# Patient Record
Sex: Male | Born: 1940
Health system: Southern US, Community
[De-identification: ages and names within clinical notes are randomized; demographics above are authoritative.]

## PROBLEM LIST (undated history)

## (undated) DIAGNOSIS — H409 Unspecified glaucoma: Secondary | ICD-10-CM

## (undated) DIAGNOSIS — H269 Unspecified cataract: Secondary | ICD-10-CM

## (undated) DIAGNOSIS — I10 Essential (primary) hypertension: Secondary | ICD-10-CM

## (undated) DIAGNOSIS — I519 Heart disease, unspecified: Secondary | ICD-10-CM

## (undated) DIAGNOSIS — R413 Other amnesia: Secondary | ICD-10-CM

## (undated) DIAGNOSIS — C61 Malignant neoplasm of prostate: Secondary | ICD-10-CM

## (undated) DIAGNOSIS — G709 Myoneural disorder, unspecified: Secondary | ICD-10-CM

## (undated) DIAGNOSIS — H9190 Unspecified hearing loss, unspecified ear: Secondary | ICD-10-CM

## (undated) DIAGNOSIS — F0390 Unspecified dementia without behavioral disturbance: Secondary | ICD-10-CM

## (undated) DIAGNOSIS — E7800 Pure hypercholesterolemia, unspecified: Secondary | ICD-10-CM

## (undated) DIAGNOSIS — I639 Cerebral infarction, unspecified: Secondary | ICD-10-CM

## (undated) DIAGNOSIS — M1990 Unspecified osteoarthritis, unspecified site: Secondary | ICD-10-CM

## (undated) DIAGNOSIS — N189 Chronic kidney disease, unspecified: Secondary | ICD-10-CM

## (undated) DIAGNOSIS — J449 Chronic obstructive pulmonary disease, unspecified: Secondary | ICD-10-CM

## (undated) DIAGNOSIS — M109 Gout, unspecified: Secondary | ICD-10-CM

## (undated) DIAGNOSIS — C679 Malignant neoplasm of bladder, unspecified: Secondary | ICD-10-CM

## (undated) DIAGNOSIS — D869 Sarcoidosis, unspecified: Secondary | ICD-10-CM

## (undated) DIAGNOSIS — I509 Heart failure, unspecified: Secondary | ICD-10-CM

## (undated) HISTORY — DX: Unspecified cataract: H26.9

## (undated) HISTORY — DX: Malignant neoplasm of bladder, unspecified: C67.9

## (undated) HISTORY — DX: Essential (primary) hypertension: I10

## (undated) HISTORY — DX: Gout, unspecified: M10.9

## (undated) HISTORY — DX: Heart disease, unspecified: I51.9

## (undated) HISTORY — DX: Unspecified hearing loss, unspecified ear: H91.90

## (undated) HISTORY — PX: CATARACT EXTRACTION: SUR2

## (undated) HISTORY — PX: BLADDER SURGERY: SHX569

## (undated) HISTORY — DX: Sarcoidosis, unspecified: D86.9

## (undated) HISTORY — DX: Unspecified glaucoma: H40.9

## (undated) HISTORY — DX: Pure hypercholesterolemia, unspecified: E78.00

## (undated) HISTORY — PX: OTHER SURGICAL HISTORY: SHX169

## (undated) HISTORY — PX: EYE SURGERY: SHX253

## (undated) HISTORY — DX: Malignant neoplasm of prostate: C61

## (undated) HISTORY — DX: Other amnesia: R41.3

---

## 1997-04-21 HISTORY — PX: OTHER SURGICAL HISTORY: SHX169

## 1998-02-10 ENCOUNTER — Emergency Department (HOSPITAL_COMMUNITY): Admission: EM | Admit: 1998-02-10 | Discharge: 1998-02-10 | Payer: Self-pay | Admitting: Emergency Medicine

## 1998-03-22 ENCOUNTER — Inpatient Hospital Stay (HOSPITAL_COMMUNITY): Admission: RE | Admit: 1998-03-22 | Discharge: 1998-03-27 | Payer: Self-pay | Admitting: Orthopedic Surgery

## 1998-03-22 ENCOUNTER — Encounter: Payer: Self-pay | Admitting: Orthopedic Surgery

## 1998-05-17 ENCOUNTER — Encounter: Admission: RE | Admit: 1998-05-17 | Discharge: 1998-06-15 | Payer: Self-pay | Admitting: Orthopedic Surgery

## 1998-11-29 ENCOUNTER — Ambulatory Visit (HOSPITAL_BASED_OUTPATIENT_CLINIC_OR_DEPARTMENT_OTHER): Admission: RE | Admit: 1998-11-29 | Discharge: 1998-11-29 | Payer: Self-pay | Admitting: Orthopedic Surgery

## 2000-07-26 ENCOUNTER — Emergency Department (HOSPITAL_COMMUNITY): Admission: EM | Admit: 2000-07-26 | Discharge: 2000-07-26 | Payer: Self-pay | Admitting: Emergency Medicine

## 2005-09-04 ENCOUNTER — Encounter: Admission: RE | Admit: 2005-09-04 | Discharge: 2005-09-04 | Payer: Self-pay | Admitting: Family Medicine

## 2006-05-14 ENCOUNTER — Ambulatory Visit: Payer: Self-pay | Admitting: Oncology

## 2006-07-12 ENCOUNTER — Inpatient Hospital Stay (HOSPITAL_COMMUNITY): Admission: EM | Admit: 2006-07-12 | Discharge: 2006-07-16 | Payer: Self-pay | Admitting: Emergency Medicine

## 2006-07-12 ENCOUNTER — Ambulatory Visit: Payer: Self-pay | Admitting: Critical Care Medicine

## 2006-07-14 ENCOUNTER — Encounter (INDEPENDENT_AMBULATORY_CARE_PROVIDER_SITE_OTHER): Payer: Self-pay | Admitting: *Deleted

## 2006-07-29 ENCOUNTER — Ambulatory Visit: Payer: Self-pay | Admitting: Oncology

## 2006-07-29 LAB — CBC & DIFF AND RETIC
BASO%: 0.5 % (ref 0.0–2.0)
Basophils Absolute: 0 10*3/uL (ref 0.0–0.1)
EOS%: 1.1 % (ref 0.0–7.0)
HGB: 10.3 g/dL — ABNORMAL LOW (ref 13.0–17.1)
IRF: 0.46 — ABNORMAL HIGH (ref 0.070–0.380)
MCH: 20.8 pg — ABNORMAL LOW (ref 28.0–33.4)
RDW: 25.8 % — ABNORMAL HIGH (ref 11.2–14.6)
RETIC #: 96.4 10*3/uL (ref 31.8–103.9)
lymph#: 3.8 10*3/uL — ABNORMAL HIGH (ref 0.9–3.3)

## 2006-07-29 LAB — COMPREHENSIVE METABOLIC PANEL
ALT: 18 U/L (ref 0–53)
AST: 20 U/L (ref 0–37)
Albumin: 3.5 g/dL (ref 3.5–5.2)
Calcium: 8.8 mg/dL (ref 8.4–10.5)
Chloride: 103 mEq/L (ref 96–112)
Potassium: 4.2 mEq/L (ref 3.5–5.3)
Sodium: 137 mEq/L (ref 135–145)
Total Protein: 8.6 g/dL — ABNORMAL HIGH (ref 6.0–8.3)

## 2006-07-29 LAB — CHCC SMEAR

## 2006-07-29 LAB — MORPHOLOGY: PLT EST: ADEQUATE

## 2006-07-29 LAB — VITAMIN B12: Vitamin B-12: 513 pg/mL (ref 211–911)

## 2006-07-29 LAB — FOLATE: Folate: 13.3 ng/mL

## 2006-08-13 ENCOUNTER — Ambulatory Visit: Payer: Self-pay | Admitting: Critical Care Medicine

## 2006-09-27 ENCOUNTER — Ambulatory Visit: Payer: Self-pay | Admitting: Oncology

## 2006-09-30 LAB — CBC & DIFF AND RETIC
Eosinophils Absolute: 0.1 10*3/uL (ref 0.0–0.5)
LYMPH%: 42.2 % (ref 14.0–48.0)
MCHC: 32.9 g/dL (ref 32.0–35.9)
MCV: 72.6 fL — ABNORMAL LOW (ref 81.6–98.0)
MONO%: 13.3 % — ABNORMAL HIGH (ref 0.0–13.0)
NEUT#: 2.4 10*3/uL (ref 1.5–6.5)
NEUT%: 42.6 % (ref 40.0–75.0)
Platelets: 216 10*3/uL (ref 145–400)
RBC: 5.1 10*6/uL (ref 4.20–5.71)
Retic %: 0.8 % (ref 0.7–2.3)

## 2006-09-30 LAB — IRON AND TIBC
Iron: 39 ug/dL — ABNORMAL LOW (ref 42–165)
UIBC: 336 ug/dL

## 2006-10-12 ENCOUNTER — Ambulatory Visit: Payer: Self-pay | Admitting: Cardiology

## 2006-12-25 ENCOUNTER — Ambulatory Visit: Payer: Self-pay | Admitting: Oncology

## 2007-10-30 ENCOUNTER — Ambulatory Visit: Payer: Self-pay | Admitting: Internal Medicine

## 2007-10-30 ENCOUNTER — Inpatient Hospital Stay (HOSPITAL_COMMUNITY): Admission: EM | Admit: 2007-10-30 | Discharge: 2007-11-01 | Payer: Self-pay | Admitting: Emergency Medicine

## 2009-04-21 HISTORY — PX: CATARACT EXTRACTION: SUR2

## 2009-06-29 ENCOUNTER — Ambulatory Visit (HOSPITAL_COMMUNITY): Admission: RE | Admit: 2009-06-29 | Discharge: 2009-06-29 | Payer: Self-pay | Admitting: Orthopedic Surgery

## 2009-10-19 ENCOUNTER — Encounter (HOSPITAL_COMMUNITY): Admission: RE | Admit: 2009-10-19 | Discharge: 2010-01-17 | Payer: Self-pay | Admitting: Urology

## 2009-11-19 ENCOUNTER — Ambulatory Visit: Admission: RE | Admit: 2009-11-19 | Discharge: 2010-01-09 | Payer: Self-pay | Admitting: Radiation Oncology

## 2010-01-17 ENCOUNTER — Emergency Department (HOSPITAL_COMMUNITY)
Admission: EM | Admit: 2010-01-17 | Discharge: 2010-01-17 | Payer: Self-pay | Source: Home / Self Care | Admitting: Emergency Medicine

## 2010-01-20 ENCOUNTER — Ambulatory Visit
Admission: RE | Admit: 2010-01-20 | Discharge: 2010-04-20 | Payer: Self-pay | Source: Home / Self Care | Attending: Radiation Oncology | Admitting: Radiation Oncology

## 2010-04-22 ENCOUNTER — Ambulatory Visit
Admission: RE | Admit: 2010-04-22 | Discharge: 2010-05-21 | Payer: Self-pay | Source: Home / Self Care | Attending: Radiation Oncology | Admitting: Radiation Oncology

## 2010-05-22 ENCOUNTER — Ambulatory Visit: Payer: MEDICARE | Attending: Radiation Oncology | Admitting: Radiation Oncology

## 2010-05-22 DIAGNOSIS — R300 Dysuria: Secondary | ICD-10-CM | POA: Insufficient documentation

## 2010-05-22 DIAGNOSIS — C61 Malignant neoplasm of prostate: Secondary | ICD-10-CM | POA: Insufficient documentation

## 2010-05-22 DIAGNOSIS — Z510 Encounter for antineoplastic radiation therapy: Secondary | ICD-10-CM | POA: Insufficient documentation

## 2010-05-22 DIAGNOSIS — R350 Frequency of micturition: Secondary | ICD-10-CM | POA: Insufficient documentation

## 2010-07-04 LAB — DIFFERENTIAL
Basophils Absolute: 0 10*3/uL (ref 0.0–0.1)
Eosinophils Absolute: 0 10*3/uL (ref 0.0–0.7)
Lymphocytes Relative: 19 % (ref 12–46)
Monocytes Absolute: 1.7 10*3/uL — ABNORMAL HIGH (ref 0.1–1.0)
Neutro Abs: 8.7 10*3/uL — ABNORMAL HIGH (ref 1.7–7.7)
Neutrophils Relative %: 68 % (ref 43–77)

## 2010-07-04 LAB — CBC
HCT: 42.3 % (ref 39.0–52.0)
MCH: 25.7 pg — ABNORMAL LOW (ref 26.0–34.0)
MCHC: 35 g/dL (ref 30.0–36.0)
RDW: 16.5 % — ABNORMAL HIGH (ref 11.5–15.5)

## 2010-07-04 LAB — URINALYSIS, ROUTINE W REFLEX MICROSCOPIC
Glucose, UA: NEGATIVE mg/dL
pH: 5.5 (ref 5.0–8.0)

## 2010-07-04 LAB — POCT I-STAT, CHEM 8
Glucose, Bld: 204 mg/dL — ABNORMAL HIGH (ref 70–99)
HCT: 49 % (ref 39.0–52.0)
Hemoglobin: 16.7 g/dL (ref 13.0–17.0)
Potassium: 3.8 mEq/L (ref 3.5–5.1)
TCO2: 27 mmol/L (ref 0–100)

## 2010-07-04 LAB — URINE MICROSCOPIC-ADD ON

## 2010-07-16 ENCOUNTER — Ambulatory Visit: Payer: MEDICARE | Attending: Radiation Oncology | Admitting: Radiation Oncology

## 2010-09-03 NOTE — Discharge Summary (Signed)
NAMEJAKORI, BURKETT               ACCOUNT NO.:  [id number]   MEDICAL RECORD NO.:  [id number]          PATIENT TYPE:  INP   LOCATION:  5529                         FACILITY:  [facility]   PHYSICIAN:  Joylene John, MD       DATE OF BIRTH:  [birth year]   DATE OF ADMISSION:  10/29/2007  DATE OF DISCHARGE:                               DISCHARGE SUMMARY   The patient is being admitted on October 26, 2007, to the [facility].   CHIEF COMPLAINT:  Dizziness for last 1 week.   HISTORY OF PRESENT ILLNESS:  This is a 70 year old African-American male  with diabetes, high blood pressure coming into the ER with complaints of  dizziness, which has being on and off since last 1 week, worsened by  movement of the head associated with some visual problems.  Per the  patient, he has not had similar symptoms in the past.  The patient  denies any fevers at home, however, here is temperature is found to be  101.7.  The patient denies any sick contacts or recent travels.  The  patient did have some nausea last week, however, he denies any vomiting.   PAST MEDICAL HISTORY:  He has history of diabetes and high blood  pressure.   SOCIAL HISTORY:  He stopped smoking 2 years ago, prior to that he smoked  for about 20 to 30 years, 1 pack would last about 2 to 3 days.  No  alcohol or no drugs.   FAMILY HISTORY:  Stroke and diabetes.   ALLERGIES:  No known allergies.   CURRENT MEDICATIONS:  1. Amaryl 4 mg which was recently started by PCP.  2. Lotrel 10/20 mg a day.  3. Metformin 1000 mg four tablets at bedtime.  4. Insulin Levemir 14 units at bedtime.  5. Zocor 40 units at bedtime.  6. Aspirin 325 mg at bedtime.   ALLERGIES:  The patient denies any allergies to any foods or medicines.   REVIEW OF SYSTEMS:  As indicated in the HPI.   PHYSICAL EXAMINATION:  VITAL SIGNS:  Temperature of 101.5, blood  pressure 133/78, pulse of 88, respirations 23, and the patient is sating  99% on room air.  GENERAL:  In no acute distress.  LUNGS:  Clear to auscultation.  CARDIOVASCULAR:  Regular rate and rhythm.  NECK:  Supple.  No JVD appreciated.  LOWER EXTREMITY:  No edema.  NEUROLOGIC:  Exam was brief and limited secondary to the patient being  sleepy and not being cooperative.  Cranial nerves grossly intact.  No  nystagmus noted.  UPPER EXTREMITY:  Strength 5/5 bilaterally.  Lower extremity strength  5/5 bilaterally.  The patient was attached to several monitors.  Tandem  gait was not checked.   PERTINENT LABS:  Sodium of 136, potassium 3.7, chloride 104, bicarb 23,  glucose 165, BUN 8, creatine 0.9, calcium 9.2, hemoglobin is 11.5,  hematocrit is 36.6, white count is 10.5, and platelets is 182.  First  set of cardiac enzymes were negative.  Urinalysis was negative.  Chest x-  ray no acute pathology.  CT head, no acute pathology moderate small  vessel disease.   ASSESSMENT AND PLAN:  The patient will need an official neurology  consult in the morning.  The symptoms could be secondary to transient  ischemic attack.  Given the fact that patient also has fever, dizziness,  and diplopia, this could be upper respiratory illness affecting the  middle ear.  The patient may need an LP to rule out aseptic meningitis,  if the symptoms do not resolve or worsen.  We will continue his home  medications; however, we will hold his Amaryl and metformin.  We will  also hold the blood pressure medications since his blood pressure is  controlled here.  This may need to be restarted later in the time.      Joylene John, MD  Electronically Signed     RP/MEDQ  D:  10/30/2007  T:  10/30/2007  Job:  [id number]

## 2010-09-03 NOTE — Discharge Summary (Signed)
[name], [name]               ACCOUNT NO.:  [id number]   MEDICAL RECORD NO.:  [id number]          PATIENT TYPE:  INP   LOCATION:  5529                         FACILITY:  [facility]   PHYSICIAN:  Ramiro Harvest, MD    DATE OF BIRTH:  1941/04/06   DATE OF ADMISSION:  10/30/2007  DATE OF DISCHARGE:  11/01/2007                               DISCHARGE SUMMARY   PRIMARY CARE PHYSICIAN:  Dr. Stacie Acres. Cliffton Asters, MD of [facility] physicians.   DISCHARGE DIAGNOSES:  1. Acute viral illness.  2. Dehydration/orthostasis.  3. Hypokalemia.  4. Type 2 diabetes.  5. Hyperlipidemia.  6. Hypertension.  7. History of mediastinal lymphadenopathy.   DISCHARGE MEDICATIONS:  1. Levemir insulin 14 units at bedtime.  2. Metformin 1000 mg p.o. daily.  3. Lotrel 10/20 p.o. daily.  4. Aspirin 325 mg p.o. daily.  5. Simvastatin 40 mg p.o. daily.  6. Amaryl 4 mg p.o. daily.   DISPOSITION AND FOLLOWUP:  The patient will be discharged home to follow  up with his PCP in 2 weeks.  On followup, a basic metabolic profile  needs to be checked to follow up on the patient's electrolytes.  The  patient's blood pressure will also need to be reassessed per PCP as well  as his diabetes.   CONSULTATIONS DONE:  None.   PROCEDURES PERFORMED:  1. A head CT without contrast was done on October 29, 2007, that showed      no acute intracranial abnormalities and moderate changes of small      vessel disease of white matter, which was stable.  Chest x-ray done      on October 30, 2007, showed stable chronic mediastinal lymphadenopathy      and increased interstitial markings, query sarcoidosis, and no      acute cardiopulmonary abnormality.  2. Chest x-ray done on October 31, 2007, showed increasing left basilar      atelectasis, otherwise stable.  Repeat head CT done on October 31, 2007, showed no acute intracranial abnormality or significant      interval change, periventricular white matter hypoattenuation is      demonstrated.   This is nonspecific but likely reflects the sequelae      of chronic microvascular ischemia.   BRIEF ADMITTING HISTORY AND PHYSICAL:  [name] is a 70-year-  old African American male  with history of type 2 diabetes and  hypertension who came into the ED with complaints of dizziness, which  had been on and off since 1 week prior to admission, worse with movement  of the head associated with some visual problems.  Per the patient, he  has not had any similar symptoms in the past.  The patient denied any  fevers at home.  However, his temperature in the emergency room was  found to be 101.7.  The patient denied any sick contacts or recent  travels.  The patient did have some nausea week prior to admission,  however, denied any vomiting.   PHYSICAL EXAM:  VITAL SIGNS: Per admitting physician, temperature 101.5,  blood pressure  133/78, pulse of 88, respirations 23, and sating 99% on  room air.  GENERAL: The patient in no acute distress.  HEENT: Normocephalic and atraumatic.  Pupils are equal, round, and  reactive to light.  Extraocular movements intact.  Oropharynx was clear.  No lesions.  No exudates.  NECK: Supple.  No lymphadenopathy.  RESPIRATORY: Lungs are clear to auscultation bilaterally.  CARDIOVASCULAR: Regular rate and rhythm.  No murmurs, rubs, or gallops.  ABDOMEN: Soft, nontender, and nondistended.  Positive bowel sounds.  EXTREMITIES: No clubbing, cyanosis, or edema.  NEUROLOGICAL EXAM: Limited secondary to the patient being sleepy and not  corporative.  Cranial nerves were grossly intact.  No nystagmus noted,  5/5 bilateral upper extremity strength, and 5/5 bilateral lower  extremity strength.  Tandem gait not tested.   ADMISSION LABS:  Sodium 136, potassium 3.7, chloride 104, bicarb 23,  glucose 165, BUN 8, creatinine 0.9, and calcium 9.2.  Hemoglobin 11.5,  hematocrit 36.6, white count 10.5, and platelets 182.  First set of  cardiac enzymes negative.   Urinalysis was negative.  Chest x-ray, no  acute pathology.  CT of the head with no acute pathology.  A moderate  small vessel disease.   HOSPITAL COURSE:  1. Acute viral illness.  The patient was admitted.  He had a      temperature of 101.5.  Sources of infection were checked, which      were negative.  Urinalysis was negative.  Chest x-ray was negative.      Head CT was negative.  The patient defervesced and remained      afebrile for 48 hours throughout the hospitalization.  It was felt      the patient may have had a brief acute respiratory illness.  The      patient's middle ear was checked, which was also negative.  CT of      the head was negative for any sinusitis or mastoiditis.  The      patient was placed on IV fluids.  The patient remained stable      throughout the hospitalization.  There was a question of a possible      CVA.  The patient has had a knee replacement on the right and as      such an MRI could not be obtained.  Repeat head CT in 40 hours was      also obtained with results as stated above, which came back      negative.  The patient remained asymptomatic throughout the      hospitalization and the patient will be discharged in stable and      improved condition.  2. Orthostasis/dehydration.  The patient was orthostatic by pulse on      admission.  It was felt this was likely secondary to poor intake      and secondary to dehydration.  The patient was rehydrated with IV      fluids.  The patient's orthostasis resolved and the patient's      dehydration was resolved with IV fluids.  The patient remained      asymptomatic and the patient's strength improved during the      hospitalization.  The patient will be discharged in stable and      improved condition.  3. Hypokalemia.  During the hospitalization, the patient had became      hypokalemic or transient hypokalemia.  The patient had a potassium      of 3.3.  This  was replaced and the patient was given a  dose of K-      Dur prior to discharge.  4. Type 2 diabetes, stable.  Hemoglobin A1c was checked during the      hospitalization came out at 10.7.  The patient was placed on a      Lantus, which was adjusted accordingly in the hospitalization and      placed on sliding-scale insulin.  Per the patient, his diabetic      medications had recently been adjusted per his PCP and as such he      will be discharged home on his home regimen.  The rest of the      patient's chronic medical issues were stable throughout the      hospitalization.  The patient will be discharged in a stable and      improved condition.  Vital signs on day of discharge, temperature      98.6, pulse of 92, blood pressure 145/85, respiratory rate 17, and      sating 90% on room air.   DISCHARGE LABS:  Sodium 136, potassium 3.3, chloride 105, bicarb 24, BUN  5, creatinine 0.90, glucose of 166, and calcium of 8.7.  CBC; white  count 5.5, hemoglobin 10.7, platelets 215, and hematocrit 33.5.  CT of  head as stated above.  The patient was given a dose of potassium, K-Dur  40 mEq p.o. x1 prior to discharge.  It was a pleasure taking care of Mr.  Mickel Craig.      Ramiro Harvest, MD  Electronically Signed     DT/MEDQ  D:  11/01/2007  T:  11/01/2007  Job:  [id number]   cc:   Stacie Acres. Cliffton Asters, M.D.

## 2010-09-06 NOTE — Op Note (Signed)
NAMEJOAN, AVETISYAN               ACCOUNT NO.:  [id number]   MEDICAL RECORD NO.:  [id number]          PATIENT TYPE:  INP   LOCATION:  4740                         FACILITY:  [facility]   PHYSICIAN:  Charlcie Cradle. Delford Field, MD, FCCPDATE OF BIRTH:  Feb 16, 1941   DATE OF PROCEDURE:  07/14/2006  DATE OF DISCHARGE:                               OPERATIVE REPORT   INDICATION:  Mediastinal hilar mass.  Evaluate for granulomatous disease  versus malignancy.   SURGEON:  Charlcie Cradle. Delford Field, MD, FCCP   ANESTHESIA:  Xylocaine 1% local.   PREOPERATIVE MEDICATION:  Versed 3 mg, fentanyl 30 mcg IV push.   PROCEDURE:  The Pentax video bronchoscope was introduced via the right  nares.  The upper airways were visualized, were unremarkable.  The  entire tracheal bronchial tree was visualized revealed no endobronchial  lesions.  Attention was then paid to the carina.  Multiple fine needle  transfer bronchial aspirates were obtained from the carina under  fluoroscopic control.  Transbronchial biopsies x6 were obtained from the  right lower lobe lateral segment.  Bronchial washings obtained as well.   COMPLICATIONS:  None.   IMPRESSION:  Mediastinal hilar lymphadenopathy.  Evaluate for malignancy  versus sarcoidosis and granulomatous lung disease, favor the latter.   RECOMMENDATIONS:  Follow pathology, cytology, microbiology.      Charlcie Cradle Delford Field, MD, [facility]  Electronically Signed     PEW/MEDQ  D:  07/14/2006  T:  07/14/2006  Job:  [id number]   cc:   [facility]

## 2010-09-06 NOTE — H&P (Signed)
NAMEDEVEREAUX, GRAYSON               ACCOUNT NO.:  [id number]   MEDICAL RECORD NO.:  [id number]          PATIENT TYPE:  EMS   LOCATION:  MAJO                         FACILITY:  [facility]   PHYSICIAN:  Hettie Holstein, D.O.    DATE OF BIRTH:  1940/06/14   DATE OF ADMISSION:  07/11/2006  DATE OF DISCHARGE:                              HISTORY & PHYSICAL   PRIMARY CARE PHYSICIAN:  Dr. Renaye Rakers   CARDIOLOGIST:  Dr. Elsie Lincoln   CHIEF COMPLAINT:  Chest pain.   HISTORY OF PRESENTING ILLNESS:  Mr. Creekmore is a 70 year old male with  history of coronary disease status post catheterization by Dr. Elsie Lincoln  one year ago who was told he had some coronary disease, but was not felt  to be significant enough for intervention.  I do not have these records  here.  I am contacting [facility] to notify them of  Mr. Obenchain presence and need for consultation at this time.  In any  event, he does not have evidence of ST-segment elevations in the  emergency department and his pain has subsided.  He has been under  evaluation by Dr. Elnoria Howard of gastroenterology about three months ago for  this finding of anemia by his primary care physician, Dr. Renaye Rakers.  He has apparently undergone upper and lower endoscopic examination by  Dr. Elnoria Howard, though the patient is not able to provide me with details.  He  is, in fact, not certain he had upper endoscopies, stated that he was  sedated and he was not told of these results.  It would be reasonable to  contact Dr. Elnoria Howard to have these records and information __________.  In  any event, in the emergency department he was found to have a hemoglobin  of 5.5 and an MCV of 54.5.  He was Hemoccult negative rectally.   He has also described difficulty swallowing his food and underwent CT  scanning in the emergency department that revealed findings that were  concerning for sarcoidosis.   PAST MEDICAL HISTORY:  This is limited to what Mr. Mendolia can provide  and  in addition to his wife, they are somewhat limited historians.   1. Reported is coronary disease status post cardiac catheterization by      Dr. Elsie Lincoln about a year ago, no intervention performed.  2. Status post endoscopy about three months by Dr. Elnoria Howard, details are      not known at this time.  3. Diabetes.  4. He has no renal dysfunction that he is aware of, nor peripheral      neuropathy as described.  5. Hypertension.  6. Status post right knee replacement.   MEDICATIONS AT HOME:  Dosages are not provided.  He obtains his  medications via mail-order through [employer] and he takes metformin unknown  dosage, glipizide unknown dosage, he takes Lotrel and does not know the  dosage and he takes aspirin 325 mg daily.   ALLERGIES:  No known drug allergies.   SOCIAL HISTORY:  He smokes about two cigarettes per day and does not  drink alcohol.  He is retired, married, has three children.   FAMILY HISTORY:  Mother died at age 79.  She had diabetes.  Father died  at age 32.  He had cerebrovascular disease.   REVIEW OF SYSTEMS:  Mr. Perkins reports some odynophagia as described  above, difficulty swallowing foods.  He has decreased energy, dyspnea on  exertion, easily fatigability, he can hardly even carry a basket of  laundry without having to sit down and rest, this has been going on for  about three to four weeks.  Has had no nausea, vomiting, diarrhea, no  bloody stools.  Further review of systems is unremarkable.   PHYSICAL EXAMINATION:  VITALS:  Blood pressure 134/66, heart rate 91, O2  saturation 96%, temperature 98.7, respiratory rate 20.  HEENT:  Reveals his head to be normocephalic, atraumatic.  Extraocular  muscles intact.  NECK:  Supple, nontender.  No palpable thyromegaly or mass.  CARDIOVASCULAR EXAM:      Reveals normal S1-S2 without appreciable  murmur.  LUNGS:  Clear to auscultation bilaterally.  ABDOMEN:  Soft and nontender.  No rebound or guarding.  No palpable   hepatosplenomegaly.  Bowel sounds are normoactive.  RECTAL EXAM:  Per emergency department staff reveals Hemoccult negative.  EXTREMITIES:  Reveal no edema.   LABORATORY DATA:  His sodium was 140, potassium 3.8, BUN 14, creatinine  1.0, glucose 240, WBC 9.7, hemoglobin 5.5, platelet count 584.  MCV was  54.5.   EKG:  This revealed sinus tachycardia with nonspecific T-wave  abnormalities.   CT scan:  As noted above.   His urinalysis was negative.  Hemoccult was negative.   Point-of-care markers at 1:05 a.m. were negative.   ASSESSMENT:  1. Chest pain, angina, most probably related to number #2.  2. Underlying coronary artery disease and symptomatic profound anemia      of unclear etiology.  3. Lymphadenopathy on CT scan suggestive of sarcoidosis.  4. Odynophagia.  5. Diabetes mellitus.  6. Hypertension.   PLAN:  At this time, we are going to admit Mr. Stump, will initiate  transfusion, reassess as well and will definitely ask the assistance of  cardiology and pursue further evaluation of his anemia and consider  pulmonary consultation for bronchoscopy and a biopsy for definitive  diagnosis of probable sarcoidosis and treatment initiation.      Hettie Holstein, D.O.  Electronically Signed     ESS/MEDQ  D:  07/12/2006  T:  07/12/2006  Job:  [id number]   cc:   Madaline Savage, M.D.  Renaye Rakers, M.D.  Jordan Hawks Elnoria Howard, MD

## 2010-09-06 NOTE — Discharge Summary (Signed)
NAMEJAMORRIS, NDIAYE               ACCOUNT NO.:  [id number]   MEDICAL RECORD NO.:  [id number]          PATIENT TYPE:  INP   LOCATION:  4740                         FACILITY:  [facility]   PHYSICIAN:  Lonia Blood, M.D.      DATE OF BIRTH:  23-May-1940   DATE OF ADMISSION:  07/11/2006  DATE OF DISCHARGE:  07/16/2006                               DISCHARGE SUMMARY   PRIMARY CARE PHYSICIAN:  Renaye Rakers, M.D.   DISCHARGE DIAGNOSES:  1. Acute on chronic anemia of unknown etiology.  2. Coronary artery disease with some chest pain, presumed to be angina      secondary to anemia.  3. Diffuse lymphadenopathy status post bronch, non-malignant.  4. Diabetes type 2.  5. Hypertension.   DISCHARGE MEDICATIONS:  1. Lotrel 10/20 one tablet daily.  2. Aspirin 325 mg daily.  3. Glipizide 10 mg twice daily.  4. Glucophage 2000 mg daily in the evening.  5. Lopressor 25 mg twice daily.  6. Zocor 40 mg daily in the evening.   DISPOSITION:  The patient will be discharged home.  He will follow up  with Dr. Elsie Lincoln as an outpatient.  He will probably have scheduled  stress test and possibly cath.  He will also follow up with Dr. Parke Simmers as  needed.   PROCEDURES PERFORMED:  Chest x-ray on March 23rd that shows new right  hilar prominence, mass or adenopathy not excluded.  CT recommended.  Mild cardiomegaly.  Head CT without contrast showed no evidence of acute  intracranial abnormality, mild atrophy and chronic small vessel white  matter ischemic changes.  Chest CT with contrast on March 23rd shows  bilateral mediastinal and hilar adenopathy with interstitial lung  disease and subpleural nodules.  Sarcoid suspected but correlating  clinically.  Also, metastatic disease or lymphoproliferative disorder  was considered less likely but not excluded.  There was a 9 mm and 6 mm  nodule within the right upper lobe, likely representing the same disease  process as described above.  Bronchoscopy subsequently  performed on  July 14, 2006 with pathology showing no specific changes.  Also, no  malignancy was identified.   CONSULTATIONS:  1. Charlcie Cradle Delford Field, MD, [facility], Pulmonary Critical Care.  2. [facility] for Dr. Elsie Lincoln.   BRIEF HISTORY AND PHYSICAL:  Please refer to dictated history and  physical on admission by Dr. Hannah Beat.  It showed, however, the  patient is a 70 year old gentleman with known history of coronary artery  disease that presented with chest pain.  He had a cath last year that  showed nonsignificant obstruction.  The patient also has problems with  some dysphagia.  Subsequently, CT scan as indicated above showed  pulmonary findings.  He was also found to be anemic with hemoglobin of  5.5.  He apparently had outpatient anemia and was scheduled to be  followed up at the cancer center for anemia workup.  He was guaiac  negative and no evidence of acute bleed anywhere.  He was subsequently  admitted for further management.   HOSPITAL COURSE:  1. Chest  pain.  The patient's chest pain seemed to be an angina.  The      most likely cause was coronary artery disease in the setting of      severe anemia.  He was transfused.  He had serial cardiac enzymes      as well as cardiology consult.  Ultimately, it was decided that the      patient did not require further aggressive workup at this point.      He will be followed up as an outpatient by Dr. Elsie Lincoln and further      assessment will be done at that time.  2. Severe anemia.  The patient received up to four units of packed red      blood cells.  His hemoglobin has stayed up at around 10.9.  All      workup in the hospital has been negative.  Further workup can be      pursued.  He already has an appointment at the cancer center.  He      has been encouraged to honor that.  3. Diffuse lymphadenopathy.  As indicated, the patient had      bronchoscopy and multiple biopsies.  No malignant cells were       identified.  This is probably all reactive.  Sarcoidosis was in the      differential but not confirmed from the biopsies.  4. Diabetes.  His blood sugar has been well controlled on his home      regimen and no adjustment was made.   DISCHARGE LABORATORY DATA:  White count 9.2, hemoglobin 10.9, platelet  count 485.  Sodium 136, potassium 4.1, chloride 105, CO2 22, BUN 15,  creatinine 1.02, glucose 87, calcium 9.4.      Lonia Blood, M.D.  Electronically Signed     LG/MEDQ  D:  07/16/2006  T:  07/16/2006  Job:  [id number]   cc:   Renaye Rakers, M.D.

## 2010-09-06 NOTE — Assessment & Plan Note (Signed)
[facility]                             PULMONARY OFFICE NOTE   NAME:[name], [name]                        MRN:          [id number]  DATE:08/13/2006                            DOB:          03-01-1941    [name] is a 70 year old, African-American male seen today for post-  hospital followup.  We saw him initially in March when he was admitted  with bilateral mediastinal lymphadenopathy.  Bronchoscopy was performed  which showed no malignancy.  His chest pain now is gone.  He is having  no dyspnea and no active respiratory complaints.   CURRENT MEDICATIONS:  1. Lotrel 10/20 daily.  2. Aspirin 325 mg daily.  3. Glipizide 10 mg b.i.d.  4. Glucophage 2000 mg daily.  5. Lopressor 25 mg b.i.d.  6. Zocor 40 mg daily.  7. __________ 150 mg b.i.d.   PHYSICAL EXAMINATION:  GENERAL:  This is a middle-aged, African American  male in no distress.  VITAL SIGNS:  Temperature 98.  Blood pressure 134/88.  Pulse 79.  Saturation was 98 percent on room air.  CHEST:  Showed to be clear without evidence of wheeze or rhonchi.  CARDIAC:  Regular rate and rhythm without S3.  Normal S1, S2.  ABDOMEN:  Soft, nontender.  EXTREMITIES:  No edema or clubbing.  SKIN:  Clear.  NEUROLOGIC:  Exam was intact.  HEENT:  No jugular venous distention.  No lymphadenopathy.   IMPRESSION:  Mediastinal lymphadenopathy, which is stable at this time.   PLAN:  Repeat CT scan of the chest on October 12, 2006, and we will follow  up results from this.  No other workup or treatment is indicated at this  time.  We will see the patient back at that time.     Alan Cradle Delford Field, MD, [facility]  Electronically Signed    PEW/MedQ  DD: 08/13/2006  DT: 08/13/2006  Job #: [id number]   cc:   Renaye Rakers, M.D.

## 2010-09-06 NOTE — Consult Note (Signed)
[name], [name]               ACCOUNT NO.:  [id number]   MEDICAL RECORD NO.:  [id number]          PATIENT TYPE:  INP   LOCATION:  4740                         FACILITY:  [facility]   PHYSICIAN:  Charlcie Cradle. Delford Field, MD, FCCPDATE OF BIRTH:  06/26/1940   DATE OF CONSULTATION:  07/13/2006  DATE OF DISCHARGE:                                 CONSULTATION   CHIEF COMPLAINT:  Chest pain, mediastinal adenopathy.   HISTORY OF PRESENT ILLNESS:  This is a 70 year old African-American male  admitted on July 12, 2006, because chest pain, tightness, no energy,  shortness of breath, cough productive of white mucus, and a hemoglobin  of 5.5.  He was given blood transfusions.  He had a catheterization a  year ago showing coronary artery disease to be treated medically,  underlying diabetes, hypertension, a recent colonoscopy that was  unremarkable by report.  The patient found to have significant bilateral  hilar and mediastinal adenopathy with pulmonary nodules.  We are asked  to consult.  His chest pain is now relieving.  He had no positive  cardiac enzymes.  He is still coughing, having some dyspnea with  exertion.  He smokes two cigarettes a day, has done so for many years.  No previous known diagnosis of pulmonary disease.   PAST HISTORY:  Medical:  History of diabetes, hypertension, coronary  artery disease, knee replacement on the right.   MEDICATIONS PRIOR TO ADMISSION:  Glipizide, Lotrel, metformin, aspirin.   MEDICATION ALLERGIES:  None.   SOCIAL HISTORY:  Smokes two cigarettes a day, has smoked for many years.  Does not drink.   FAMILY HISTORY:  Diabetes and stroke.   PHYSICAL EXAMINATION:  GENERAL:  This is an elderly male in no distress.  VITAL SIGNS:  Blood pressure 107/62, pulse 84, respiration 20,  saturation 95% in room air, temperature 99.  CHEST:  Showed to be clear with few scattered wheezes.  CARDIAC:  Showed a regular rate and rhythm without S3; normal S1, S2.  ABDOMEN:   Soft, nontender.  EXTREMITIES:  Showed no edema, clubbing or venous disease.  Skin was  clear.  NEUROLOGIC:  Exam was intact.  There was no evidence of peripheral  lymphadenopathy.   LABORATORY DATA:  CT scan of chest was obtained and reviewed and  revealed bilateral hilar adenopathy with bilateral pulmonary nodules,  noncalcified.  Sodium 140, potassium 3.8, chloride 106, BUN 14, blood  sugar 240.  White count 9.7.  Hemoglobin initially was 5.5, after  transfusion is increased to 7.6.  Platelet count 428.  PT/INR is normal.   IMPRESSION:  That of bilateral hilar and mediastinal adenopathy with  bilateral pulmonary nodules.  Evaluate for probable malignancy versus  sarcoidosis.   RECOMMENDATIONS:  Will pursue bronchoscopy at 7:30 a.m. on July 14, 2006.  Once the results of this are available, further recommendations  will follow.      Charlcie Cradle Delford Field, MD, [facility]  Electronically Signed     PEW/MEDQ  D:  07/13/2006  T:  07/13/2006  Job:  [id number]   cc:   [name], M.D.

## 2010-10-18 ENCOUNTER — Emergency Department (HOSPITAL_COMMUNITY)
Admission: EM | Admit: 2010-10-18 | Discharge: 2010-10-18 | Disposition: A | Discharge status: Home / Self Care | Payer: Medicare Other | Attending: Emergency Medicine | Admitting: Emergency Medicine

## 2010-10-18 ENCOUNTER — Emergency Department (HOSPITAL_COMMUNITY): Payer: Medicare Other

## 2010-10-18 DIAGNOSIS — R319 Hematuria, unspecified: Secondary | ICD-10-CM | POA: Insufficient documentation

## 2010-10-18 DIAGNOSIS — R079 Chest pain, unspecified: Secondary | ICD-10-CM | POA: Insufficient documentation

## 2010-10-18 DIAGNOSIS — Z794 Long term (current) use of insulin: Secondary | ICD-10-CM | POA: Insufficient documentation

## 2010-10-18 DIAGNOSIS — E119 Type 2 diabetes mellitus without complications: Secondary | ICD-10-CM | POA: Insufficient documentation

## 2010-10-18 DIAGNOSIS — I10 Essential (primary) hypertension: Secondary | ICD-10-CM | POA: Insufficient documentation

## 2010-10-18 DIAGNOSIS — F411 Generalized anxiety disorder: Secondary | ICD-10-CM | POA: Insufficient documentation

## 2010-10-18 DIAGNOSIS — R0602 Shortness of breath: Secondary | ICD-10-CM | POA: Insufficient documentation

## 2010-10-18 DIAGNOSIS — Z8546 Personal history of malignant neoplasm of prostate: Secondary | ICD-10-CM | POA: Insufficient documentation

## 2010-10-18 DIAGNOSIS — D649 Anemia, unspecified: Secondary | ICD-10-CM | POA: Insufficient documentation

## 2010-10-18 DIAGNOSIS — I252 Old myocardial infarction: Secondary | ICD-10-CM | POA: Insufficient documentation

## 2010-10-18 LAB — BASIC METABOLIC PANEL
BUN: 11 mg/dL (ref 6–23)
Calcium: 9.2 mg/dL (ref 8.4–10.5)
Creatinine, Ser: 0.96 mg/dL (ref 0.50–1.35)
GFR calc Af Amer: >60 mL/min (ref >60)

## 2010-10-18 LAB — URINALYSIS, ROUTINE W REFLEX MICROSCOPIC
Bilirubin Urine: NEGATIVE
Nitrite: NEGATIVE
Protein, ur: 100 mg/dL — AB
pH: 5.5 (ref 5.0–8.0)

## 2010-10-18 LAB — APTT: aPTT: 30 seconds (ref 24–37)

## 2010-10-18 LAB — DIFFERENTIAL
Basophils Absolute: 0 10*3/uL (ref 0.0–0.1)
Basophils Relative: 0 % (ref 0–1)
Eosinophils Absolute: 0.1 10*3/uL (ref 0.0–0.7)
Eosinophils Relative: 2 % (ref 0–5)
Lymphocytes Relative: 31 % (ref 12–46)
Monocytes Absolute: 0.8 10*3/uL (ref 0.1–1.0)

## 2010-10-18 LAB — URINE MICROSCOPIC-ADD ON

## 2010-10-18 LAB — CBC
MCHC: 32.4 g/dL (ref 30.0–36.0)
Platelets: 193 10*3/uL (ref 150–400)
RDW: 15.7 % — ABNORMAL HIGH (ref 11.5–15.5)
WBC: 6.4 10*3/uL (ref 4.0–10.5)

## 2010-10-18 LAB — SAMPLE TO BLOOD BANK

## 2010-10-20 LAB — URINE CULTURE
Colony Count: >=100000
Culture  Setup Time: 201206292222

## 2010-10-25 ENCOUNTER — Ambulatory Visit (HOSPITAL_BASED_OUTPATIENT_CLINIC_OR_DEPARTMENT_OTHER)
Admission: RE | Admit: 2010-10-25 | Discharge: 2010-10-25 | Disposition: A | Discharge status: Home / Self Care | Payer: Medicare Other | Source: Ambulatory Visit | Attending: Urology | Admitting: Urology

## 2010-10-25 ENCOUNTER — Other Ambulatory Visit: Payer: Self-pay | Admitting: Urology

## 2010-10-25 DIAGNOSIS — R310 Gross hematuria: Secondary | ICD-10-CM | POA: Insufficient documentation

## 2010-10-25 DIAGNOSIS — E109 Type 1 diabetes mellitus without complications: Secondary | ICD-10-CM | POA: Insufficient documentation

## 2010-10-25 DIAGNOSIS — D090 Carcinoma in situ of bladder: Secondary | ICD-10-CM | POA: Insufficient documentation

## 2010-10-25 DIAGNOSIS — Z01812 Encounter for preprocedural laboratory examination: Secondary | ICD-10-CM | POA: Insufficient documentation

## 2010-10-25 DIAGNOSIS — I252 Old myocardial infarction: Secondary | ICD-10-CM | POA: Insufficient documentation

## 2010-10-25 DIAGNOSIS — I10 Essential (primary) hypertension: Secondary | ICD-10-CM | POA: Insufficient documentation

## 2010-10-25 DIAGNOSIS — C61 Malignant neoplasm of prostate: Secondary | ICD-10-CM | POA: Insufficient documentation

## 2010-10-25 DIAGNOSIS — I2510 Atherosclerotic heart disease of native coronary artery without angina pectoris: Secondary | ICD-10-CM | POA: Insufficient documentation

## 2010-10-25 DIAGNOSIS — Z923 Personal history of irradiation: Secondary | ICD-10-CM | POA: Insufficient documentation

## 2010-10-25 LAB — POCT I-STAT 4, (NA,K, GLUC, HGB,HCT)
Glucose, Bld: 153 mg/dL — ABNORMAL HIGH (ref 70–99)
HCT: 31 % — ABNORMAL LOW (ref 39.0–52.0)
Hemoglobin: 10.5 g/dL — ABNORMAL LOW (ref 13.0–17.0)
Potassium: 4.2 mEq/L (ref 3.5–5.1)
Sodium: 141 mEq/L (ref 135–145)

## 2010-10-29 ENCOUNTER — Emergency Department (HOSPITAL_COMMUNITY)
Admission: EM | Admit: 2010-10-29 | Discharge: 2010-10-29 | Disposition: A | Discharge status: Home / Self Care | Payer: Medicare Other | Attending: Emergency Medicine | Admitting: Emergency Medicine

## 2010-10-29 DIAGNOSIS — I252 Old myocardial infarction: Secondary | ICD-10-CM | POA: Insufficient documentation

## 2010-10-29 DIAGNOSIS — R339 Retention of urine, unspecified: Secondary | ICD-10-CM | POA: Insufficient documentation

## 2010-10-29 DIAGNOSIS — I2510 Atherosclerotic heart disease of native coronary artery without angina pectoris: Secondary | ICD-10-CM | POA: Insufficient documentation

## 2010-10-29 DIAGNOSIS — Z9889 Other specified postprocedural states: Secondary | ICD-10-CM | POA: Insufficient documentation

## 2010-10-29 DIAGNOSIS — I10 Essential (primary) hypertension: Secondary | ICD-10-CM | POA: Insufficient documentation

## 2010-10-29 DIAGNOSIS — Y846 Urinary catheterization as the cause of abnormal reaction of the patient, or of later complication, without mention of misadventure at the time of the procedure: Secondary | ICD-10-CM | POA: Insufficient documentation

## 2010-10-29 DIAGNOSIS — R319 Hematuria, unspecified: Secondary | ICD-10-CM | POA: Insufficient documentation

## 2010-10-29 DIAGNOSIS — Z8546 Personal history of malignant neoplasm of prostate: Secondary | ICD-10-CM | POA: Insufficient documentation

## 2010-10-29 DIAGNOSIS — E119 Type 2 diabetes mellitus without complications: Secondary | ICD-10-CM | POA: Insufficient documentation

## 2010-10-29 DIAGNOSIS — T8389XA Other specified complication of genitourinary prosthetic devices, implants and grafts, initial encounter: Secondary | ICD-10-CM | POA: Insufficient documentation

## 2010-11-01 ENCOUNTER — Other Ambulatory Visit: Payer: Self-pay | Admitting: Urology

## 2010-11-01 ENCOUNTER — Ambulatory Visit (HOSPITAL_BASED_OUTPATIENT_CLINIC_OR_DEPARTMENT_OTHER)
Admission: RE | Admit: 2010-11-01 | Discharge: 2010-11-01 | Disposition: A | Discharge status: Home / Self Care | Payer: Medicare Other | Source: Ambulatory Visit | Attending: Urology | Admitting: Urology

## 2010-11-01 DIAGNOSIS — Z01812 Encounter for preprocedural laboratory examination: Secondary | ICD-10-CM | POA: Insufficient documentation

## 2010-11-01 DIAGNOSIS — I252 Old myocardial infarction: Secondary | ICD-10-CM | POA: Insufficient documentation

## 2010-11-01 DIAGNOSIS — E119 Type 2 diabetes mellitus without complications: Secondary | ICD-10-CM | POA: Insufficient documentation

## 2010-11-01 DIAGNOSIS — I10 Essential (primary) hypertension: Secondary | ICD-10-CM | POA: Insufficient documentation

## 2010-11-01 DIAGNOSIS — Z794 Long term (current) use of insulin: Secondary | ICD-10-CM | POA: Insufficient documentation

## 2010-11-01 DIAGNOSIS — D090 Carcinoma in situ of bladder: Secondary | ICD-10-CM | POA: Insufficient documentation

## 2010-11-01 DIAGNOSIS — Z7982 Long term (current) use of aspirin: Secondary | ICD-10-CM | POA: Insufficient documentation

## 2010-11-01 DIAGNOSIS — Z79899 Other long term (current) drug therapy: Secondary | ICD-10-CM | POA: Insufficient documentation

## 2010-11-01 LAB — BASIC METABOLIC PANEL
CO2: 24 mEq/L (ref 19–32)
Creatinine, Ser: 1 mg/dL (ref 0.50–1.35)
GFR calc Af Amer: >60 mL/min (ref >60)

## 2010-11-01 LAB — CBC
MCH: 23.3 pg — ABNORMAL LOW (ref 26.0–34.0)
MCHC: 31.9 g/dL (ref 30.0–36.0)
MCV: 72.9 fL — ABNORMAL LOW (ref 78.0–100.0)
Platelets: 203 10*3/uL (ref 150–400)

## 2010-11-01 LAB — GLUCOSE, CAPILLARY: Glucose-Capillary: 112 mg/dL — ABNORMAL HIGH (ref 70–99)

## 2010-11-01 LAB — PROTIME-INR: Prothrombin Time: 14.1 seconds (ref 11.6–15.2)

## 2010-11-21 NOTE — Op Note (Signed)
  [name], [name]               ACCOUNT NO.:  [id number]  MEDICAL RECORD NO.:  [id number]  LOCATION:                                 FACILITY:  PHYSICIAN:  Danae Chen, M.D.  DATE OF BIRTH:  28-Aug-1940  DATE OF PROCEDURE:  10/25/2010 DATE OF DISCHARGE:                              OPERATIVE REPORT   PREOPERATIVE DIAGNOSES:  Gross hematuria, adenocarcinoma of prostate status post intensity modulated radiation therapy.  POSTOPERATIVE DIAGNOSES:  Gross hematuria, adenocarcinoma of prostate status post intensity modulated radiation therapy.  PROCEDURE:  Cystoscopy, bladder biopsy, and fulguration of bleeders.  SURGEON:  Danae Chen, MD  ANESTHESIA:  General.  INDICATION:  The patient is 71 year old male with history of gross painless hematuria for the past 2 to 3 weeks.  CT scan showed normal kidneys and irregular left posterior lateral wall thickening of the bladder.  Cystoscopy showed reddened mucosa on the left posterolateral wall of the bladder and blood clots in the bladder without definite evidence of a tumor in the bladder.  The patient has continued to have hematuria.  He is scheduled today for cystoscopy, bladder biopsy, and fulguration of bleeders.  DESCRIPTION OF PROCEDURE:  The patient was identified by his wristband and proper time-out was taken.  Under general anesthesia, he was prepped and draped and placed in the dorsal lithotomy position.  A panendoscope was inserted in the bladder. The anterior urethra was normal.  There was edema on the left posterolateral wall of the bladder without any evidence of a tumor in the bladder.  The bladder mucosa is reddened.  There is no stone in the bladder.  The ureteral orifices are in normal position and shape.  With the cold cup forceps, a biopsy of the reddened mucosa was done.  Then the areas of biopsy were fulgurated with the Bugbee electrode as well as some bleeding points at the bladder neck.  There was no  evidence of bleeding at the end of the procedure.  Some small blood clots were irrigated out of the bladder.  The cystoscope was then removed.  A #16 Foley catheter was then inserted in the bladder.  The patient tolerated the procedure well and left the OR in satisfactory condition to postanesthesia care unit.     Danae Chen, M.D.     MN/MEDQ  D:  10/25/2010  T:  10/25/2010  Job:  [id number]  Electronically Signed by Lindaann Slough M.D. on 11/21/2010 09:04:58 PM

## 2010-11-21 NOTE — Op Note (Signed)
  NAMEODIES, DESA               ACCOUNT NO.:  [id number]  MEDICAL RECORD NO.:  [id number]  LOCATION:                                 FACILITY:  PHYSICIAN:  Danae Chen, M.D.  DATE OF BIRTH:  1940/12/14  DATE OF PROCEDURE:  11/01/2010 DATE OF DISCHARGE:                              OPERATIVE REPORT   PREOPERATIVE DIAGNOSIS:  Carcinoma in situ of the bladder.  POSTOPERATIVE DIAGNOSIS:  Carcinoma in situ of the bladder.  PROCEDURE:  Cystoscopy, transurethral resection of bladder tumor and random bladder biopsy.  SURGEON:  Danae Chen, MD  ANESTHESIA:  General.  INDICATIONS:  The patient is 70 year old male with history of gross hematuria.  Cystoscopy showed reddened bladder mucosa.  A biopsy of the reddened area of the bladder mucosa showed CIS.  The patient needs TUR of the bladder and random bladder biopsy.  He is scheduled today for the procedure.  DESCRIPTION OF PROCEDURE:  The patient was identified by his wristbandand proper time-out was taken.  Under general anesthesia, the patient was prepped and draped and placed in the dorsal lithotomy position.  The urethra was dilated up to #30- [place] with Sissy Hoff sounds, then a #28 Iglesias resectoscope was inserted in the bladder.  The bladder mucosa was reddened.  The area of biopsy of the bladder a week ago was at left bladder base near the bladder neck.  It was difficult to visualize the ureteral orifices.  1 ampule of indigo carmine was then given IV.  Then resection of the reddened areas of the bladder neck and the bladder base was done with the resectoscope.  Even after the injection of indigo carmine, it was difficult to visualize the ureteral orifices.  Hemostasis was secured with electrocautery, care being taken not to fulgurate the presumed area of location of the ureteral orifice.  Some prostatic tissue was also resected in the area of the bladder neck on the left side of the bladder.  Then the resectoscope  was removed.  A cystoscope was inserted in the bladder and a random bladder biopsy was done with more attention to the reddened areas of the bladder mucosa.  The specimens were sent in 2 different containers, the first one in the area of  the CIS of the bladder and the second: random bladder biopsy.  The resectoscope was then reinserted in the bladder.  The areas of biopsies were fulgurated with the resectoscope loop.  There was no evidence of bleeding at the end of the procedure.  The resectoscope was then removed.  A #28-French Coude catheter was then inserted in the bladder.  The patient tolerated the procedure well and left the OR in satisfactory condition to postanesthesia care unit.     Danae Chen, M.D.     MN/MEDQ  D:  11/01/2010  T:  11/01/2010  Job:  [id number]  Electronically Signed by Lindaann Slough M.D. on 11/21/2010 09:06:27 PM

## 2010-12-14 ENCOUNTER — Emergency Department (HOSPITAL_COMMUNITY)
Admission: EM | Admit: 2010-12-14 | Discharge: 2010-12-14 | Disposition: A | Discharge status: Home / Self Care | Payer: Medicare Other | Attending: Emergency Medicine | Admitting: Emergency Medicine

## 2010-12-14 DIAGNOSIS — Z8546 Personal history of malignant neoplasm of prostate: Secondary | ICD-10-CM | POA: Insufficient documentation

## 2010-12-14 DIAGNOSIS — I10 Essential (primary) hypertension: Secondary | ICD-10-CM | POA: Insufficient documentation

## 2010-12-14 DIAGNOSIS — E119 Type 2 diabetes mellitus without complications: Secondary | ICD-10-CM | POA: Insufficient documentation

## 2010-12-14 DIAGNOSIS — Z794 Long term (current) use of insulin: Secondary | ICD-10-CM | POA: Insufficient documentation

## 2010-12-14 DIAGNOSIS — I252 Old myocardial infarction: Secondary | ICD-10-CM | POA: Insufficient documentation

## 2010-12-14 DIAGNOSIS — R319 Hematuria, unspecified: Secondary | ICD-10-CM | POA: Insufficient documentation

## 2010-12-14 DIAGNOSIS — I2510 Atherosclerotic heart disease of native coronary artery without angina pectoris: Secondary | ICD-10-CM | POA: Insufficient documentation

## 2010-12-14 DIAGNOSIS — R339 Retention of urine, unspecified: Secondary | ICD-10-CM | POA: Insufficient documentation

## 2010-12-26 ENCOUNTER — Other Ambulatory Visit (HOSPITAL_BASED_OUTPATIENT_CLINIC_OR_DEPARTMENT_OTHER): Payer: Self-pay | Admitting: Internal Medicine

## 2010-12-26 ENCOUNTER — Encounter (HOSPITAL_BASED_OUTPATIENT_CLINIC_OR_DEPARTMENT_OTHER): Payer: Medicare Other | Attending: Internal Medicine

## 2010-12-26 ENCOUNTER — Ambulatory Visit (HOSPITAL_COMMUNITY)
Admission: RE | Admit: 2010-12-26 | Discharge: 2010-12-26 | Disposition: A | Discharge status: Home / Self Care | Payer: Medicare Other | Source: Ambulatory Visit | Attending: Internal Medicine | Admitting: Internal Medicine

## 2010-12-26 DIAGNOSIS — T148XXA Other injury of unspecified body region, initial encounter: Secondary | ICD-10-CM

## 2010-12-26 DIAGNOSIS — Z01818 Encounter for other preprocedural examination: Secondary | ICD-10-CM | POA: Insufficient documentation

## 2010-12-26 DIAGNOSIS — D090 Carcinoma in situ of bladder: Secondary | ICD-10-CM | POA: Insufficient documentation

## 2010-12-26 DIAGNOSIS — R1012 Left upper quadrant pain: Secondary | ICD-10-CM | POA: Insufficient documentation

## 2010-12-26 DIAGNOSIS — Z794 Long term (current) use of insulin: Secondary | ICD-10-CM | POA: Insufficient documentation

## 2010-12-26 DIAGNOSIS — Y842 Radiological procedure and radiotherapy as the cause of abnormal reaction of the patient, or of later complication, without mention of misadventure at the time of the procedure: Secondary | ICD-10-CM | POA: Insufficient documentation

## 2010-12-26 DIAGNOSIS — N3040 Irradiation cystitis without hematuria: Secondary | ICD-10-CM | POA: Insufficient documentation

## 2010-12-26 DIAGNOSIS — Z79899 Other long term (current) drug therapy: Secondary | ICD-10-CM | POA: Insufficient documentation

## 2010-12-26 DIAGNOSIS — C679 Malignant neoplasm of bladder, unspecified: Secondary | ICD-10-CM | POA: Insufficient documentation

## 2010-12-26 DIAGNOSIS — I10 Essential (primary) hypertension: Secondary | ICD-10-CM | POA: Insufficient documentation

## 2010-12-26 DIAGNOSIS — E119 Type 2 diabetes mellitus without complications: Secondary | ICD-10-CM | POA: Insufficient documentation

## 2010-12-26 DIAGNOSIS — C61 Malignant neoplasm of prostate: Secondary | ICD-10-CM | POA: Insufficient documentation

## 2010-12-26 NOTE — Progress Notes (Unsigned)
[facility]  NAME:  [name], [name]               ACCOUNT NO.:  [id number]  MEDICAL RECORD NO.:  [id number]      DATE OF BIRTH:  [birth year]  PHYSICIAN:  Maxwell Caul, M.D. VISIT DATE:  12/26/2010                                  OFFICE VISIT   FACILITY:  [facility].  CHIEF COMPLAINT:  Consultation with regards to hyperbaric oxygen.  [name] has been referred to this clinic for consideration of hyperbaric oxygen.  I have a note from Dr. Su Grand dated December 16, 2010.  I have also reviewed what is available on e-chart.  We do not yet have access to epic here.  In discussion with the patient and his wife who is present, he apparently was diagnosed with prostate cancer sometime in 2011.  He underwent 40 weeks of radiation treatment starting in December 2011 and completed this without incident.  He apparently developed bleeding 1-2 months ago, this was gross hematuria.  He developed bladder obstruction due to clots and required a Foley catheter placement.  His urine appears to be cleaning up.  There is less evidence of bleeding now per the patient.  As mentioned, he was sent here for consideration of hyperbaric oxygen for radiation cystitis.  I note that he has had two cystoscopies, one on July 6, the other on July 13.  He was diagnosed with carcinoma in situ of the bladder per Dr. Madilyn Hook last procedure note.  He has undergone a transurethral resection of a bladder tumor and random bladder biopsies.  Past pathology report showed benign fibromuscular stroma with patchy mild chronic inflammation and focal benign urothelium.  No evidence of malignancy.  Bladder biopsy showed patchy foci of hemorrhage and focal mild chronic inflammation with no evidence of papillary invasive carcinoma.  Biopsy from July 6 showed urothelial carcinoma in situ.  PAST MEDICAL HISTORY: 1. Type 2 diabetes. 2. Hyperlipidemia. 3. Hypertension. 4. History of  mediastinal lymphadenopathy, I think with some biopsies     dating back to several years ago. 5. The patient states he has coronary artery disease and has seen Dr.     Clarene Duke, although, I do not see anything about this currently.  MEDICATIONS:  List is reviewed.  In terms of his diabetes, he is on Levemir 38 units in the evening and metformin 500 daily.  He also takes simvastatin 40 daily, Flomax 0.4 at bedtime, amlodipine 10 mg daily, and ASA 325 daily.  PHYSICAL EXAMINATION:  VITAL SIGNS:  Temperature is 99.5, pulse 94, respirations 20, blood pressure is 130/75. RESPIRATORY:  Clear air entry bilaterally. CARDIAC:  He has a left parasternal lift, however, there are no cardiac murmurs identified.  No carotid bruits. ABDOMEN:  Sloughed liver, spleen.  His bladder has a Foley catheter in place.  IMPRESSION: 1. Radiation cystitis as described by Dr. Brunilda Payor, this is secondary to     radiation for prostate cancer which is apparently completed earlier     this year.  We will solicit records from [facility] Oncology with     regards to the duration, number of treatments, etc.. 2. Carcinoma in situ of the bladder.  Recent cystoscopy and biopsies     were negative. 3. Type 2 diabetes, on insulin. 4. Hypertension.  The  patient would be a candidate for hyperbaric oxygen therapy.  I discussed this with him in this way for some detail.  We actually going to have him go back and see one of the chambers and discussed the logistics of this with our technicians.  He will then make a decision about whether he wants to proceed with this.          ______________________________ Maxwell Caul, M.D.     MGR/MEDQ  D:  12/26/2010  T:  12/26/2010  Job:  [id number]

## 2010-12-28 ENCOUNTER — Emergency Department (HOSPITAL_COMMUNITY)
Admission: EM | Admit: 2010-12-28 | Discharge: 2010-12-28 | Disposition: A | Discharge status: Home / Self Care | Payer: Medicare Other | Attending: Emergency Medicine | Admitting: Emergency Medicine

## 2010-12-28 DIAGNOSIS — R339 Retention of urine, unspecified: Secondary | ICD-10-CM | POA: Insufficient documentation

## 2010-12-28 DIAGNOSIS — I2510 Atherosclerotic heart disease of native coronary artery without angina pectoris: Secondary | ICD-10-CM | POA: Insufficient documentation

## 2010-12-28 DIAGNOSIS — I10 Essential (primary) hypertension: Secondary | ICD-10-CM | POA: Insufficient documentation

## 2010-12-28 DIAGNOSIS — E119 Type 2 diabetes mellitus without complications: Secondary | ICD-10-CM | POA: Insufficient documentation

## 2010-12-28 DIAGNOSIS — R319 Hematuria, unspecified: Secondary | ICD-10-CM | POA: Insufficient documentation

## 2010-12-28 DIAGNOSIS — Z794 Long term (current) use of insulin: Secondary | ICD-10-CM | POA: Insufficient documentation

## 2010-12-28 DIAGNOSIS — Z8546 Personal history of malignant neoplasm of prostate: Secondary | ICD-10-CM | POA: Insufficient documentation

## 2010-12-28 DIAGNOSIS — I252 Old myocardial infarction: Secondary | ICD-10-CM | POA: Insufficient documentation

## 2010-12-28 LAB — URINALYSIS, ROUTINE W REFLEX MICROSCOPIC
Glucose, UA: NEGATIVE mg/dL
Specific Gravity, Urine: 1.013 (ref 1.005–1.030)
Urobilinogen, UA: 0.2 mg/dL (ref 0.0–1.0)
pH: 5 (ref 5.0–8.0)

## 2010-12-28 LAB — URINE MICROSCOPIC-ADD ON

## 2010-12-28 LAB — POCT I-STAT, CHEM 8
BUN: 7 mg/dL (ref 6–23)
Creatinine, Ser: 1 mg/dL (ref 0.50–1.35)
Glucose, Bld: 187 mg/dL — ABNORMAL HIGH (ref 70–99)
Hemoglobin: 7.8 g/dL — ABNORMAL LOW (ref 13.0–17.0)
Potassium: 3 mEq/L — ABNORMAL LOW (ref 3.5–5.1)

## 2010-12-29 ENCOUNTER — Inpatient Hospital Stay (HOSPITAL_COMMUNITY)
Admission: EM | Admit: 2010-12-29 | Discharge: 2010-12-31 | DRG: 669 | Disposition: A | Discharge status: Home / Self Care | Payer: Medicare Other | Attending: Internal Medicine | Admitting: Internal Medicine

## 2010-12-29 DIAGNOSIS — N390 Urinary tract infection, site not specified: Secondary | ICD-10-CM | POA: Diagnosis present

## 2010-12-29 DIAGNOSIS — Z96659 Presence of unspecified artificial knee joint: Secondary | ICD-10-CM

## 2010-12-29 DIAGNOSIS — D090 Carcinoma in situ of bladder: Principal | ICD-10-CM | POA: Diagnosis present

## 2010-12-29 DIAGNOSIS — Z794 Long term (current) use of insulin: Secondary | ICD-10-CM

## 2010-12-29 DIAGNOSIS — E785 Hyperlipidemia, unspecified: Secondary | ICD-10-CM | POA: Diagnosis present

## 2010-12-29 DIAGNOSIS — I10 Essential (primary) hypertension: Secondary | ICD-10-CM | POA: Diagnosis present

## 2010-12-29 DIAGNOSIS — I2510 Atherosclerotic heart disease of native coronary artery without angina pectoris: Secondary | ICD-10-CM | POA: Diagnosis present

## 2010-12-29 DIAGNOSIS — D62 Acute posthemorrhagic anemia: Secondary | ICD-10-CM | POA: Diagnosis present

## 2010-12-29 DIAGNOSIS — E876 Hypokalemia: Secondary | ICD-10-CM | POA: Diagnosis present

## 2010-12-29 DIAGNOSIS — C61 Malignant neoplasm of prostate: Secondary | ICD-10-CM | POA: Diagnosis present

## 2010-12-29 DIAGNOSIS — E119 Type 2 diabetes mellitus without complications: Secondary | ICD-10-CM | POA: Diagnosis present

## 2010-12-29 DIAGNOSIS — B952 Enterococcus as the cause of diseases classified elsewhere: Secondary | ICD-10-CM | POA: Diagnosis present

## 2010-12-29 DIAGNOSIS — R319 Hematuria, unspecified: Secondary | ICD-10-CM | POA: Diagnosis present

## 2010-12-29 LAB — CBC
HCT: 20.2 % — ABNORMAL LOW (ref 39.0–52.0)
Hemoglobin: 6.3 g/dL — CL (ref 13.0–17.0)
MCHC: 31.2 g/dL (ref 30.0–36.0)

## 2010-12-29 LAB — POCT I-STAT, CHEM 8
BUN: 6 mg/dL (ref 6–23)
Creatinine, Ser: 0.9 mg/dL (ref 0.50–1.35)
Hemoglobin: 7.5 g/dL — ABNORMAL LOW (ref 13.0–17.0)
Potassium: 3.3 mEq/L — ABNORMAL LOW (ref 3.5–5.1)
Sodium: 140 mEq/L (ref 135–145)

## 2010-12-29 LAB — DIFFERENTIAL
Basophils Absolute: 0 10*3/uL (ref 0.0–0.1)
Lymphocytes Relative: 21 % (ref 12–46)
Monocytes Absolute: 0.9 10*3/uL (ref 0.1–1.0)
Monocytes Relative: 9 % (ref 3–12)
Neutro Abs: 6.5 10*3/uL (ref 1.7–7.7)

## 2010-12-29 LAB — COMPREHENSIVE METABOLIC PANEL
AST: 15 U/L (ref 0–37)
Albumin: 2.9 g/dL — ABNORMAL LOW (ref 3.5–5.2)
Alkaline Phosphatase: 75 U/L (ref 39–117)
Chloride: 104 mEq/L (ref 96–112)
Potassium: 3.3 mEq/L — ABNORMAL LOW (ref 3.5–5.1)
Sodium: 140 mEq/L (ref 135–145)
Total Bilirubin: 0.3 mg/dL (ref 0.3–1.2)
Total Protein: 7.2 g/dL (ref 6.0–8.3)

## 2010-12-29 LAB — APTT: aPTT: 32 seconds (ref 24–37)

## 2010-12-29 LAB — IRON AND TIBC
Iron: <10 ug/dL — ABNORMAL LOW (ref 42–135)
UIBC: 331 ug/dL (ref 125–400)

## 2010-12-29 LAB — GLUCOSE, CAPILLARY

## 2010-12-29 LAB — FERRITIN: Ferritin: 32 ng/mL (ref 22–322)

## 2010-12-29 LAB — PROTIME-INR: INR: 1.13 (ref 0.00–1.49)

## 2010-12-30 LAB — GLUCOSE, CAPILLARY
Glucose-Capillary: 131 mg/dL — ABNORMAL HIGH (ref 70–99)
Glucose-Capillary: 164 mg/dL — ABNORMAL HIGH (ref 70–99)
Glucose-Capillary: 228 mg/dL — ABNORMAL HIGH (ref 70–99)

## 2010-12-30 LAB — CBC
MCV: 75.1 fL — ABNORMAL LOW (ref 78.0–100.0)
Platelets: 317 10*3/uL (ref 150–400)
RBC: 3.77 MIL/uL — ABNORMAL LOW (ref 4.22–5.81)
RDW: 18.6 % — ABNORMAL HIGH (ref 11.5–15.5)
WBC: 11.2 10*3/uL — ABNORMAL HIGH (ref 4.0–10.5)

## 2010-12-30 LAB — BASIC METABOLIC PANEL
Calcium: 9.2 mg/dL (ref 8.4–10.5)
GFR calc Af Amer: >60 mL/min (ref >60)
GFR calc non Af Amer: >60 mL/min (ref >60)
Glucose, Bld: 132 mg/dL — ABNORMAL HIGH (ref 70–99)
Potassium: 3.2 mEq/L — ABNORMAL LOW (ref 3.5–5.1)
Sodium: 138 mEq/L (ref 135–145)

## 2010-12-30 LAB — COMPREHENSIVE METABOLIC PANEL
ALT: 10 U/L (ref 0–53)
AST: 14 U/L (ref 0–37)
CO2: 26 mEq/L (ref 19–32)
Calcium: 9.1 mg/dL (ref 8.4–10.5)
Chloride: 104 mEq/L (ref 96–112)
Creatinine, Ser: 0.78 mg/dL (ref 0.50–1.35)
GFR calc Af Amer: >60 mL/min (ref >60)
GFR calc non Af Amer: >60 mL/min (ref >60)
Glucose, Bld: 167 mg/dL — ABNORMAL HIGH (ref 70–99)
Sodium: 140 mEq/L (ref 135–145)
Total Bilirubin: 0.4 mg/dL (ref 0.3–1.2)

## 2010-12-30 LAB — HEMOGLOBIN A1C: Hgb A1c MFr Bld: 6.9 % — ABNORMAL HIGH (ref <5.7)

## 2010-12-31 LAB — CBC
Hemoglobin: 8.6 g/dL — ABNORMAL LOW (ref 13.0–17.0)
Platelets: 274 10*3/uL (ref 150–400)
RBC: 3.51 MIL/uL — ABNORMAL LOW (ref 4.22–5.81)
WBC: 12.4 10*3/uL — ABNORMAL HIGH (ref 4.0–10.5)

## 2010-12-31 LAB — CROSSMATCH
Antibody Screen: NEGATIVE
Unit division: 0

## 2010-12-31 LAB — URINE CULTURE

## 2010-12-31 LAB — GLUCOSE, CAPILLARY
Glucose-Capillary: 194 mg/dL — ABNORMAL HIGH (ref 70–99)
Glucose-Capillary: 209 mg/dL — ABNORMAL HIGH (ref 70–99)

## 2010-12-31 LAB — BASIC METABOLIC PANEL
CO2: 27 mEq/L (ref 19–32)
Calcium: 8.8 mg/dL (ref 8.4–10.5)
Chloride: 101 mEq/L (ref 96–112)
Potassium: 3.5 mEq/L (ref 3.5–5.1)
Sodium: 135 mEq/L (ref 135–145)

## 2010-12-31 NOTE — Op Note (Signed)
NAMEZAYON, [name]               ACCOUNT NO.:  [id number]  MEDICAL RECORD NO.:  [id number]  LOCATION:  1344                         FACILITY:  [facility]  PHYSICIAN:  Danae Chen, M.D.  DATE OF BIRTH:  [birth year]  DATE OF PROCEDURE:  12/30/2010 DATE OF DISCHARGE:                              OPERATIVE REPORT   PREOPERATIVE DIAGNOSES: 1. Gross hematuria. 2. Carcinoma of the prostate. 3. Carcinoma in situ of the bladder.  PROCEDURE: 1. Cystoscopy. 2. Fulguration of bleeders.  SURGEON:  Danae Chen, M.D.  ANESTHESIA:  General.  DATE OF PROCEDURE:  December 30, 2010.  INDICATIONS:  The patient is a 70 year old male who had a bladder biopsy for gross hematuria on October 25, 2010.  CT scan revealed a normal upper tracts.  He had a repeat TUR biopsy on November 01, 2010, that showed no residual carcinoma of the bladder.  The patient had IMRT from April 11, 2010, through June 10, 2010, for adenocarcinoma of the prostate, Gleason 4+3 and PSA of 3.6.  The patient has been having a gross hematuria on and off for the past month.  He has an indwelling Foley catheter that is draining bloody urine.  The patient was referred to the [facility] for hyperbaric oxygen in management of his gross hematuria, probably secondary to radiation cystitis.  He has gone to the emergency room 2 or 3 times for clots urinary retention.  On last Saturday, he went back to the emergency room with clot urinary retention.  His hemoglobin was 6.3.  He was then admitted for further treatment.  His urine has remained bloody.  He was transfused 2 units of packed cells and hemoglobin went up to 7.7.  He is now scheduled for cystoscopy and fulguration of bleeders.  The patient was identified by his wristband and proper time-out was taken.  Under general anesthesia, he was prepped and draped and placed in the dorsal lithotomy position.  A panendoscope was inserted in the bladder. The anterior urethra is normal.   He has trilobar prostatic hypertrophy, but the bladder neck is open.  Several  small blood clots were irrigated  out of the bladder.  There is no evidence of bleeding at the level of the prostate.  There was some bleeding at the level of the bladder neck in the midline.  The bladder mucosa is reddened.  The trigone is somewhat edematous, probably secondary to catheter cystitis and radiation cystitis.  The cystoscope was removed.  A #26 resectoscope was then inserted in the bladder.  Fulguration of the bleeders at the bladder neck was done.  There was some reddened area at the level of the trigone that was also fulgurated.  Care was taken not to fulgurate the area of the ureteral orifices.  The reddened areas of the bladder mucosa were also fulgurated.  One ampule of indigo carmine was given intravenously and there was blue dye coming out of the ureteral orifices.  There was no evidence of bleeding.  The bladder was irrigated with normal saline and the return was clear.  At this point, the procedure was ended.  The resectoscope was removed.  A #24 three-way Foley catheter was inserted  in the bladder.  The patient tolerated the procedure well and left the OR in satisfactory condition to post anesthesia care unit with clear urine.     Danae Chen, M.D.     MN/MEDQ  D:  12/30/2010  T:  12/31/2010  Job:  [id number]  Electronically Signed by Lindaann Slough M.D. on 12/31/2010 11:19:46 AM

## 2010-12-31 NOTE — Discharge Summary (Signed)
[name], [name]               ACCOUNT NO.:  [id number]  MEDICAL RECORD NO.:  [id number]  LOCATION:  1344                         FACILITY:  [facility]  PHYSICIAN:  Alan Blower, MD       DATE OF BIRTH:  Nov 22, 1940  DATE OF ADMISSION:  12/29/2010 DATE OF DISCHARGE:                              DISCHARGE SUMMARY   PRIMARY CARE PHYSICIAN:  Alan Acres. White, MD  UROLOGIST:  Alan Slough, MD  DISCHARGE DIAGNOSES: 1. Gross hematuria, status post cystoscopy with fulguration of     bleeders. 2. Carcinoma of the prostate. 3. Carcinoma in situ of the bladder. 4. Enterococcus urinary tract infection. 5. Hypokalemia. 6. Type 2 diabetes. 7. Hypertension. 8. History of coronary artery disease.  DISCHARGE MEDICATIONS: 1. Ampicillin 500 mg p.o. q.i.d. for 5 days. 2. Aspirin 81 mg p.o. q. day. 3. Amlodipine 10 mg p.o. q. day. 4. Benazepril 20 mg p.o. q. day. 5. Fish oil over-the-counter 1 tablet p.o. 3 times a day. 6. Flomax 0.4 mg p.o. q. day. 7. Humalog 1 to 10 units subcu daily as needed. 8. Iron over-the-counter 1 tablet p.o. 3 times a day. 9. Levemir 38 units subcu daily at bedtime. 10.Metformin XR 500 mg tablets 4 tablets p.o. q. day. 11.Metoprolol 25 mg p.o. q. day. 12.Simvastatin 20 mg p.o. q. day.  BRIEF ADMITTING HISTORY AND PHYSICAL:  [name] is a 70 year old African-American gentleman with history of adenocarcinoma of the prostate, status post radiation; carcinoma in situ of the bladder, status post transurethral resection of bladder tumor with indwelling Foley for 1 month; type 2 diabetes; hypertension; hyperlipidemia; coronary artery disease, who presented with a blocked Foley catheter and lower abdominal pain on December 29, 2010.  RADIOLOGY/IMAGING:  The patient had chest x-ray, 2-view, on December 26, 2010, which showed stable chest x-ray with no evidence of cardiac or pulmonary process.  LABORATORY DATA:  CBC shows Craig count of 12.4, hemoglobin  8.6, hematocrit 26.2, platelet count 274.  Electrolytes were normal with a BUN of 8, creatinine 0.89.  Liver function tests normal with an albumin of 2.7.  Hemoglobin A1c is 6.9.  Serum iron less than 10, UIBC 331, B12 of 296, serum folate 12.0, ferritin 32.  UA is positive for nitrites and large leukocytes, many bacteria.  Urine culture grew enterococcus species that is sensitive to ampicillin.  HOSPITAL COURSE BY PROBLEM: 1. Gross hematuria.  The patient was evaluated by initially Dr.     Patsi Craig with Urology and Dr. Brunilda Craig on December 30, 2010.  The     patient was taken to cystoscopy on December 30, 2010, and had     fulguration of the bleeders.  Subsequently, since then, bleeding     had resolved.  Dr. Brunilda Craig to evaluate the patient prior to discharge     to determine whether the patient will be discharged home with a     Foley catheter or will have the catheter discontinued prior to     discharge. 2. Carcinoma of the prostate.  Further management as per Dr. Brunilda Craig as     an outpatient. 3. Carcinoma in situ of the bladder.  Further management as per Dr.  Nesi as outpatient. 4. Enterococcal UTI.  Initially, the patient was started on ampicillin     which will be continued for 5 more days to complete a course of     antibiotics. 5. Hypokalemia.  Replete as needed, resolved. 6. Anemia due to acute blood loss from gross hematuria.  The patient     received 2 units of blood during the course of hospital stay. 7. Type 2 diabetes.  Sugars were stable.  The patient to resume home     insulin regimen at the time of discharge. 8. Hypertension.  Initially, antihypertensive medications were held,     which will be resumed at the time of discharge. 9. History of coronary artery disease.  The patient's full-dose     aspirin was decreased to aspirin 81 mg.  We will defer to     his primary care physician whether the patient should be back on     full-dose aspirin.  DISPOSITION AND  FOLLOWUP:  The patient is to follow up with Dr. Brunilda Craig in 1 week as an outpatient.  The patient is to follow up with Dr. Cliffton Craig, his primary care physician in 1 to 2 weeks as needed.  Time spent on discharge talking to the patient, consultants, and coordinating care was 25 minutes.     Alan Blower, MD     SR/MEDQ  D:  12/31/2010  T:  12/31/2010  Job:  [id number]  Electronically Signed by [name]  on 12/31/2010 08:40:15 PM

## 2010-12-31 NOTE — Consult Note (Signed)
NAMEDEMITRIUS, CRASS               ACCOUNT NO.:  [id number]  MEDICAL RECORD NO.:  [id number]  LOCATION:  WLED                         FACILITY:  [facility]  PHYSICIAN:  Teiana Hajduk I. Patsi Sears, M.D.DATE OF BIRTH:  January 30, 1941  DATE OF CONSULTATION: DATE OF DISCHARGE:                                CONSULTATION   SUBJECTIVE:  Mr. Remmel is a 70 year old diabetic male from [place], with a history of both prostate cancer and carcinoma in situ of the bladder.  In December 2011, the patient underwent IMRT for treatment of Gleason 4+3 (seven) carcinoma prostate, with a PSA of 3.6.  The patient has since developed gross hematuria, with clot formation, post Foley catheterization.  He has had office CT showing a left posterolateral bladder wall thickening.  In addition, the patient has a history of CIS in the bladder, and recent office cystoscopy showed erythematous and edematous left posterior bladder wall, but without evidence of frank tumor.  The patient and Dr. Brunilda Payor planned to watch and observe to see if bleeding wound cease, but the patient has now had two [facility] ED evaluation for urinary clot retention, with Foley catheterization.  He has been found to be profoundly anemic, admitted for transfusion and anemia evaluation by [facility].  PAST MEDICAL HISTORY:  Significant for: 1. Angina. 2. Coronary artery disease. 3. Status post MI. 4. Diabetes. 5. Hypertension. 6. Lymphedema. 7. Prostate cancer. 8. Sarcoidosis.  SOCIAL HISTORY:  Alcohol is none currently.  Tobacco use is 15 pack-year history, but none x5 years.  Drug abuse is none.  The patient's weight is 200.9 ounces.  The patient lives at home with his wife.  PAST SURGICAL HISTORY:  Significant for knee replacement (right).  MEDICATIONS:  Include: 1. Amlodipine. 2. Aspirin 325 mg a day. 3. Fish oil. 4. Humulin insulin 70/30 100 units subcu per day. 5. Levemir FlexPen solution. 6. Levofloxacin (none  currently). 7. Metformin 500 mg per day. 8. Metoprolol. 9. Simvastatin. 10.Tamsulosin.  ALLERGIES:  None.  PHYSICAL EXAMINATION:  GENERAL:  Today shows elderly [place] American male, in no acute distress with Foley catheter in place. VITAL SIGNS:  Show blood pressure 134/59, temperature 98.3, pulse 105, and respiratory 20. NECK:  Supple, nontender. CHEST:  Clear to P and A. ABDOMEN:  Soft plus bowel sounds without organomegaly and without masses.  GENITOURINARY:  Shows normal penis, normal testicles descended bilaterally.  Foley catheter in place. EXTREMITIES:  No cyanosis, no edema.  ADMITTING LABORATORIES:  Show white blood cell count 9300, hemoglobin 6.3, hematocrit 20.2, and platelet count 301.  Sodium 140, potassium 3.3, chloride 103, BUN 6, creatinine 0.9, and glucose 183.  IMPRESSION:  No profound anemia secondary for unknown reason.  The patient is status post successful completion of radiation therapy for prostate cancer.  He has known carcinoma of the situ of bladder, he currently has gross hematuria with clot formation and spasm.  I have considered Ditropan for spasm, but we will hold off for now because of his history of glaucoma, and until that can be further delineated (wide angle versus narrow angle), we will stop anticholinergics.  He will have transfusion and evaluation per [facility].  I will notify Dr.  Nesi with the patient's admission.     Abagale Boulos I. Patsi Sears, M.D.     SIT/MEDQ  D:  12/29/2010  T:  12/29/2010  Job:  [id number]  cc:   Lindaann Slough, M.D. Fax: [phone]  Electronically Signed by Jethro Bolus M.D. on 12/31/2010 04:57:58 PM

## 2011-01-07 NOTE — H&P (Signed)
[name], [name]               ACCOUNT NO.:  [id number]  MEDICAL RECORD NO.:  [id number]  LOCATION:  [facility]                         FACILITY:  [facility]  PHYSICIAN:  Marcellus Scott, MD     DATE OF BIRTH:  03/13/1941  DATE OF ADMISSION:  12/29/2010 DATE OF DISCHARGE:                             HISTORY & PHYSICAL   PRIMARY CARE PHYSICIAN:  Stacie Acres. Cliffton Asters, M.D.  UROLOGIST:  Lindaann Slough, M.D.  CHIEF COMPLAINT:  Blocked Foley catheter and lower abdominal pain.  HISTORY OF PRESENT ILLNESS:  [name] is a 70 year old African American male patient with history of adenocarcinoma of the prostate, status post radiation therapy, carcinoma in situ of the bladder, status post transurethral resection of bladder tumor, who has an indwelling Foley catheter for the last 1 month, type 2 diabetes mellitus which is insulin dependent, hypertension, hyperlipidemia, coronary artery disease, who presented to the emergency room yesterday secondary to blocked Foley catheter from blood clots.  He was also complaining of lower abdominal pain.  His Foley catheter was changed and he was placed empirically on ciprofloxacin for UTI and the patient was discharged home.  However, at 2 o'clock early this morning, the patient again started to have blockage of his Foley catheter by blood clots with leaking of urine from the Foley, lower abdominal pain.  He indicates the abdominal pain is intermittent, 10/10 in severity, cramping in nature with no real relieving or aggravating factors, however, he indicates that coughing sometimes makes the pain worse.  When the patient returned today the emergency room and the Foley catheter was changed, he had temporary relief of the pain.  He denies any fevers or chills.  He denies nausea, vomiting.  He indicates he is constipated.  He denies dizziness or weakness or passing out episodes.  The [facility] discussed his care with urologist on-call and I have requested  the [facility] to admit him secondary to his severe anemia and further management of his hematuria.Marland Kitchen  PAST MEDICAL HISTORY: 1. Prostate cancer, status post radiation therapy. 2. Carcinoma in situ of urinary bladder, status post transurethral     resection. 3. Indwelling Foley catheter for a month. 4. Type 2 diabetes mellitus/insulin dependent. 5. Hypertension. 6. Hyperlipidemia. 7. Coronary artery disease.  The patient and his wife claimed that he     had a stress test done 2-3 days back at [facility], but     this dictator is unable to find the records for this.  PAST SURGICAL HISTORY: 1. Right total knee replacement. 2. Transurethral resection of bladder tumor.  ALLERGIES:  No known drug allergies.  HOME MEDICATIONS:  Awaiting his home medication reconciliation.  The patient takes aspirin, [place] fish oil, allopurinol, Flomax, Humalog, Levemir, metformin, Novo Twist, simvastatin, and some other medications.  SOCIAL HISTORY:  The patient is married.  He is retired.  He is independent of daily living.  There is no history of smoking, alcohol, or drug abuse.  FAMILY HISTORY:  Positive for hypertension, diabetes, and heart disease.  ADVANCED DIRECTIVES:  Full code.  REVIEW OF SYSTEMS:  Apart from history of presenting illness, pertinent for constipation.  The patient indicates he has a  good appetite.  He occasionally has rectal pain.  There is no rectal bleeding.  He also complains of on and off transient 1- to 2-second left-sided chest pain, which is not radiating and improves after coughing.  The hematuria according to the patient's spouse has been a lot.  PHYSICAL EXAMINATION:  GENERAL:  [name] is a moderately built and nourished male patient, who is in no obvious distress. VITAL SIGNS:  Temperature 98.3 degrees Fahrenheit, blood pressure 144/73 mmHg, pulse 101 per minute regular, respiration 18 per minute, and saturating at 93% on room air. HEENT:   Nontraumatic, normocephalic.  Pupils equally reacting to light and accommodation.  Oral mucosa is borderline hydration. NECK:  Supple.  No JVD or carotid bruit. LYMPHATICS:  No lymphadenopathy. RESPIRATORY SYSTEM:  Clear, but no increased work of breathing. CARDIOVASCULAR SYSTEM:  First and second heart sounds heard, regular rate and rhythm.  No JVD or murmurs. ABDOMEN:  Obese.  Mild tenderness in the suprapubic region.  He has a Foley catheter, which has gross hematuria.  It is draining well at this time.  There is no rigidity, guarding, or rebound.  Abdomen is soft.  No organomegaly or mass appreciated.  Normal bowel sounds heard. CENTRAL NERVOUS SYSTEM:  The patient is awake, alert, oriented x3 with no focal neurological deficit. EXTREMITIES:  With grade 5/5 power.  2+ bilateral lower extremity pitting edema. SKIN:  Without any rashes.  LABORATORY DATA:  CBC; hemoglobin is 6.3, hematocrit 20.2, MCV 73, white blood cell 9.3, platelets 301.  I-STAT shows sodium 140, potassium 3.3, chloride 103, bicarbonate 22, glucose 183, BUN 6, creatinine 0.90.  The urine culture from September 8 is pending.  Urinalysis done yesterdayshowed 3-6 white blood cells, too numerous to count red blood cells, and many bacteria.  Chest x-ray from September 6 showed stable chest x-ray with no evidence of acute cardiopulmonary process.  ASSESSMENT AND PLAN: 1. Hematuria, possibly secondary to radiation cystitis versus urinary     tract infection. Hematuria is now causing obstruction of his Foley     catheter.  The catheter has been changed by the ED physician.  The     urologist, Dr. Jethro Bolus, has been consulted by the ED     physician.  We will await their further recommendations.  The     patient has apparently been referred in the past to the wound     center for consultation for hyperbaric oxygen.  This has to be     further discussed with the urologist. 2. Acute blood loss on chronic anemia  secondary to ongoing hematuria.     The patient is currently asymptomatic, but given his low hemoglobin     and ongoing hematuria, we will transfuse 2 units of packed red     blood cells and monitor his hemoglobin and hematocrits.  We will     obtain anemia panel prior to transfusion to rule out iron     deficiency. 3. Urinary tract infection.  We will place on Cipro pending cultures. 4. Hypokalemia.  We will replete and follow. 5. Insulin-dependent type 2 diabetes mellitus.  The patient indicates     that he takes Levemir 38 units at bedtime, but not every night,     maybe 3-4 nights in a week.  We will await his med reconciliation,     but place him on 10 units of Lantus q.h.s. and sliding scale     insulin. 6. Hypertension.  Currently controlled.  Await his  home medication     reconciliation. 7. History of coronary artery disease.  The patient indicates that he     had a recent stress test, but unable to find any records indicating     such.  To try and find out if truly had one done and followup those     results. 8. The patient is full code status.  Time taken in coordinating this history and physical note was 1 hour.     Marcellus Scott, MD     AH/MEDQ  D:  12/29/2010  T:  12/29/2010  Job:  [id number] cc:   Stacie Acres. Cliffton Asters, M.D. Fax: [phone]  Lindaann Slough, M.D. Fax: [phone]  Electronically Signed by Marcellus Scott MD on 01/07/2011 06:58:26 PM

## 2011-01-16 LAB — CBC
HCT: 31.5 — ABNORMAL LOW
HCT: 34.1 — ABNORMAL LOW
HCT: 36.6 — ABNORMAL LOW
Hemoglobin: 10.3 — ABNORMAL LOW
MCHC: 31.9
MCV: 67.3 — ABNORMAL LOW
MCV: 67.6 — ABNORMAL LOW
MCV: 67.8 — ABNORMAL LOW
MCV: 68.1 — ABNORMAL LOW
Platelets: 182
Platelets: 213
RBC: 4.65
RDW: 19.1 — ABNORMAL HIGH
RDW: 19.1 — ABNORMAL HIGH
RDW: 19.3 — ABNORMAL HIGH
WBC: 6.3

## 2011-01-16 LAB — COMPREHENSIVE METABOLIC PANEL
Albumin: 3.1 — ABNORMAL LOW
BUN: 6
Chloride: 106
Creatinine, Ser: 1.04
Total Bilirubin: 1.1
Total Protein: 7.5

## 2011-01-16 LAB — BASIC METABOLIC PANEL
BUN: 6
BUN: 8
CO2: 24
Calcium: 8.7
Chloride: 105
Chloride: 108
Creatinine, Ser: 0.9
Creatinine, Ser: 0.99
GFR calc non Af Amer: >60
Glucose, Bld: 165 — ABNORMAL HIGH
Glucose, Bld: 166 — ABNORMAL HIGH
Potassium: 3.7
Potassium: 3.7
Sodium: 136

## 2011-01-16 LAB — DIFFERENTIAL
Basophils Absolute: 0
Basophils Absolute: 0.1
Eosinophils Absolute: 0
Eosinophils Absolute: 0.1
Eosinophils Relative: 2
Lymphocytes Relative: 26
Lymphocytes Relative: 26
Lymphocytes Relative: 31
Lymphs Abs: 1.7
Lymphs Abs: 3.3
Monocytes Absolute: 1.1 — ABNORMAL HIGH
Monocytes Absolute: 1.1 — ABNORMAL HIGH
Monocytes Relative: 17 — ABNORMAL HIGH
Neutro Abs: 4.5
Neutro Abs: 5.6
Neutrophils Relative %: 59

## 2011-01-16 LAB — URINALYSIS, ROUTINE W REFLEX MICROSCOPIC
Bilirubin Urine: NEGATIVE
Glucose, UA: 500 — AB
Ketones, ur: NEGATIVE
pH: 6

## 2011-01-16 LAB — URINE MICROSCOPIC-ADD ON

## 2011-01-16 LAB — POCT CARDIAC MARKERS
CKMB, poc: 1
Myoglobin, poc: 218

## 2011-01-23 ENCOUNTER — Encounter (HOSPITAL_BASED_OUTPATIENT_CLINIC_OR_DEPARTMENT_OTHER): Payer: Medicare Other | Attending: Internal Medicine

## 2011-01-23 DIAGNOSIS — I10 Essential (primary) hypertension: Secondary | ICD-10-CM | POA: Insufficient documentation

## 2011-01-23 DIAGNOSIS — Z794 Long term (current) use of insulin: Secondary | ICD-10-CM | POA: Insufficient documentation

## 2011-01-23 DIAGNOSIS — E119 Type 2 diabetes mellitus without complications: Secondary | ICD-10-CM | POA: Insufficient documentation

## 2011-01-23 DIAGNOSIS — Z79899 Other long term (current) drug therapy: Secondary | ICD-10-CM | POA: Insufficient documentation

## 2011-01-23 DIAGNOSIS — N3040 Irradiation cystitis without hematuria: Secondary | ICD-10-CM | POA: Insufficient documentation

## 2011-01-23 DIAGNOSIS — Y842 Radiological procedure and radiotherapy as the cause of abnormal reaction of the patient, or of later complication, without mention of misadventure at the time of the procedure: Secondary | ICD-10-CM | POA: Insufficient documentation

## 2011-01-23 DIAGNOSIS — D090 Carcinoma in situ of bladder: Secondary | ICD-10-CM | POA: Insufficient documentation

## 2011-01-23 DIAGNOSIS — C61 Malignant neoplasm of prostate: Secondary | ICD-10-CM | POA: Insufficient documentation

## 2011-04-22 HISTORY — PX: PROSTATECTOMY: SHX69

## 2011-04-29 ENCOUNTER — Encounter: Payer: Self-pay | Admitting: Radiation Oncology

## 2011-04-30 ENCOUNTER — Encounter: Payer: Self-pay | Admitting: Radiation Oncology

## 2011-04-30 ENCOUNTER — Ambulatory Visit
Admission: RE | Admit: 2011-04-30 | Discharge: 2011-04-30 | Disposition: A | Discharge status: Home / Self Care | Payer: Medicare Other | Source: Ambulatory Visit | Attending: Radiation Oncology | Admitting: Radiation Oncology

## 2011-04-30 VITALS — BP 126/76 | HR 93 | Temp 97.6°F | Resp 18 | Wt 199.7 lb

## 2011-04-30 DIAGNOSIS — C61 Malignant neoplasm of prostate: Secondary | ICD-10-CM

## 2011-04-30 NOTE — Progress Notes (Signed)
Followup note:  [name] is seen today approximately 11 months following completion of external beam/IMRT along with short-term androgen deprivation therapy in the management of his stage T2 C. versus T3 A. intermediate to high-risk adenocarcinoma prostate. I last saw him in March of 2012 at which time he was doing well with the exception of a urinary tract infection requiring another course of antibiotics. By June July he was having episodes of gross painless hematuria and he underwent further evaluation by Dr. Brunilda Payor. He apparently underwent cystoscopy and was found to have carcinoma in situ of the bladder. It is not clear to me as to whether or not he was having hematuria from his carcinoma in situ,infection or from cystitis from radiation therapy. He had a TURB. He required catheterization for a number of weeks. He is usually found to have another urinary tract infection and saw Dr. Johny Blamer who has him on Macrobid and also Pyridium. He is having less dysuria. To my knowledge he's not had any further androgen deprivation therapy since his Lupron in February 2012. I do not have a recent PSA.  He's not examined today.  Impression: It is unclear to me as to the etiology of his gross hematuria. This could be from radiation urethritis/cystitis, a primary bladder tumor or infection. He may need to see Dr. Brunilda Payor in the near future. He is currently being managed by Dr. Johny Blamer.  Plan: I will see the patient back for a followup visit in 3 months to mark his progress.

## 2011-04-30 NOTE — Progress Notes (Signed)
Patient presents to the clinic today accompanied by his wife for a follow up appointment with Dr. Dayton Scrape. Patient is alert and oriented to person, place, and time. No distress noted. Steady gait noted. Pleasant affect noted. Patient denies pain at this time. Patient reports that he was hospitalized December 29, 2010 with gross hematuria requiring transfusion. Patient reports burning upon urination for which he was prescribed Macrobid and Pyridium by his urologist. Patient reports that he has one more pyridium pill to take before completing this prescribed therapy. Patient reports he continues to see blood in his urine and spotting. Patient does not remember having any hormone shot since February of 2012. Patient denies having any hot flashes. Patient's wife reports he lacks libido.  Reported all findings to Dr. Dayton Scrape.

## 2011-07-29 ENCOUNTER — Encounter: Payer: Self-pay | Admitting: Radiation Oncology

## 2011-07-29 ENCOUNTER — Ambulatory Visit
Admission: RE | Admit: 2011-07-29 | Discharge: 2011-07-29 | Disposition: A | Discharge status: Home / Self Care | Payer: Medicare Other | Source: Ambulatory Visit | Attending: Radiation Oncology | Admitting: Radiation Oncology

## 2011-07-29 VITALS — BP 125/71 | HR 75 | Temp 97.1°F | Resp 18 | Wt 200.8 lb

## 2011-07-29 DIAGNOSIS — C61 Malignant neoplasm of prostate: Secondary | ICD-10-CM

## 2011-07-29 NOTE — Progress Notes (Signed)
Followup note:  [name] returns today approximately one year 2 months following completion of IMRT in the management of his high-risk adenocarcinoma prostate. He had been receiving androgen deprivation therapy through Dr. Brunilda Payor until December 2012. He saw Dr. Brunilda Payor for evaluation of hematuria was hospitalized last summer. He failed to maintain his followup with Dr. Brunilda Payor and I do not have a PSA determination. He has been followed by Dr. Johny Blamer. He is doing well from a GU and GI standpoint although he does have nocturia x3-4. No recent hematuria.  Physical examination: Rectal examination: Prostate gland is without focal induration or nodularity.  Impression: Satisfactory progress, however he did not receive his full 2 years of androgen deprivation therapy. We do not have a recent PSA.  Plan: I started the patient and his wife to make an appointment to see Dr. Brunilda Payor in the very near future for followup visit. I've not scheduled [name] for a formal followup visit with me and I asked that Dr. Brunilda Payor keep me posted on his progress.

## 2011-07-29 NOTE — Progress Notes (Signed)
Patient presents to the clinic today accompanied by his wife for a follow up appointment with Dr. Dayton Scrape. Patient is alert and oriented to person, place,and time. No distress noted. Steady gait noted. Pleasant affect noted. Patient denies pain at this time. Patient see his eye doctor next Tuesday but, does not have a follow up appointment with Dr. Brunilda Payor. Patient denies burning or hematuria with urination. Patient reports a strong urine stream. Patient reports one episode of diarrhea this morning. Patient reports that on average he gets up 4 times during the night to void. Patient has no complaints at this time. Reported all findings to Dr. Dayton Scrape.

## 2011-12-11 ENCOUNTER — Other Ambulatory Visit: Payer: Self-pay | Admitting: Gastroenterology

## 2012-12-06 ENCOUNTER — Encounter: Payer: Self-pay | Admitting: Endocrinology

## 2012-12-06 ENCOUNTER — Ambulatory Visit (INDEPENDENT_AMBULATORY_CARE_PROVIDER_SITE_OTHER): Payer: Medicare Other | Admitting: Endocrinology

## 2012-12-06 VITALS — BP 144/80 | HR 90 | Temp 98.2°F | Resp 12 | Ht 69.0 in | Wt 200.5 lb

## 2012-12-06 DIAGNOSIS — E7800 Pure hypercholesterolemia, unspecified: Secondary | ICD-10-CM

## 2012-12-06 DIAGNOSIS — E876 Hypokalemia: Secondary | ICD-10-CM

## 2012-12-06 DIAGNOSIS — E1169 Type 2 diabetes mellitus with other specified complication: Secondary | ICD-10-CM | POA: Insufficient documentation

## 2012-12-06 DIAGNOSIS — I10 Essential (primary) hypertension: Secondary | ICD-10-CM | POA: Insufficient documentation

## 2012-12-06 LAB — COMPREHENSIVE METABOLIC PANEL
Albumin: 3.7 g/dL (ref 3.5–5.2)
BUN: 9 mg/dL (ref 6–23)
Calcium: 9.2 mg/dL (ref 8.4–10.5)
Chloride: 103 mEq/L (ref 96–112)
Glucose, Bld: 143 mg/dL — ABNORMAL HIGH (ref 70–99)
Potassium: 3.1 mEq/L — ABNORMAL LOW (ref 3.5–5.1)

## 2012-12-06 LAB — LIPID PANEL
HDL: 36.1 mg/dL — ABNORMAL LOW (ref >39.00)
LDL Cholesterol: 93 mg/dL (ref 0–99)
Total CHOL/HDL Ratio: 4
Triglycerides: 90 mg/dL (ref 0.0–149.0)

## 2012-12-06 NOTE — Progress Notes (Signed)
Patient ID: [name], male   DOB: November 22, 1940, 72 y.o.   MRN: [id number]  [name] is an 72 y.o. male.   Reason for Appointment: Diabetes follow-up   History of Present Illness   Diagnosis: Type 2 DIABETES MELITUS  His previous records are not available at this time but he has been on basal bolus insulin regimen with insulin for some time along with metformin and Victoza     Oral hypoglycemic drugs: Low dose metformin        Side effects from medications: None Insulin regimen: Levemir 38 hs; Humalog 15 ac tid          Proper timing of medications in relation to meals: Yes.          Monitors blood glucose: Once a day.    Glucometer: One Touch.          Blood Glucose readings from meter download: readings before breakfast:  128-163, recently higher After breakfast 55-125. After lunch 124-211 with only 3 readings and late evening 121-239, not clear which readings are after meals.  Overall median 150 Hypoglycemia frequency:  after breakfast including 3 days ago with glucose 55 .          Meals: 3 meals per day.  For breakfast has eggs grits/toast Physical activity: exercise: Walking occasionally             Wt Readings from Last 3 Encounters:  12/06/12 200 lb 8 oz (90.946 kg)  07/29/11 200 lb 12.8 oz (91.082 kg)  04/30/11 199 lb 11.2 oz (90.583 kg)      Medication List       This list is accurate as of: 12/06/12 10:04 AM.  Always use your most recent med list.               amLODipine 10 MG tablet  Commonly known as:  NORVASC  10-20 mg.     amLODipine-benazepril 10-20 MG per capsule  Commonly known as:  LOTREL  Take 1 capsule by mouth daily.     aspirin 325 MG tablet  Take 325 mg by mouth daily.     dorzolamide-timolol 22.3-6.8 MG/ML ophthalmic solution  Commonly known as:  COSOPT     ferrous fumarate 325 (106 FE) MG Tabs tablet  Commonly known as:  HEMOCYTE - 106 mg FE  Take 1 tablet by mouth 3 (three) times daily.     fish oil-omega-3 fatty acids 1000 MG  capsule  Take 2 g by mouth daily.     insulin detemir 100 UNIT/ML injection  Commonly known as:  LEVEMIR  Inject 38 Units into the skin at bedtime.     insulin NPH-regular (70-30) 100 UNIT/ML injection  Commonly known as:  NOVOLIN 70/30  Inject into the skin.     metFORMIN 500 MG tablet  Commonly known as:  GLUCOPHAGE  Take 500 mg by mouth 2 (two) times daily with a meal.     metoprolol succinate 25 MG 24 hr tablet  Commonly known as:  TOPROL-XL  Take 25 mg by mouth daily.     metoprolol tartrate 25 MG tablet  Commonly known as:  LOPRESSOR     nitrofurantoin 100 MG capsule  Commonly known as:  MACRODANTIN  Take 100 mg by mouth 4 (four) times daily.     ONE TOUCH ULTRA TEST test strip  Generic drug:  glucose blood     phenazopyridine 200 MG tablet  Commonly known as:  PYRIDIUM  Take 200 mg by  mouth 3 (three) times daily as needed.     simvastatin 40 MG tablet  Commonly known as:  ZOCOR  Take 20 mg by mouth every evening.     tamsulosin 0.4 MG Caps capsule  Commonly known as:  FLOMAX  Take by mouth.     VICTOZA 18 MG/3ML Sopn  Generic drug:  Liraglutide  Inject 1.2 mg into the skin.        Allergies: No Known Allergies  Past Medical History  Diagnosis Date  . Hypertension   . Diabetes mellitus     insulin dependent  . Hypercholesterolemia   . Sarcoidosis   . Unspecified glaucoma(365.9)   . Heart disease, unspecified     Past Surgical History  Procedure Laterality Date  . Knee replacement surgery      right  . Cataract extraction      Family History  Problem Relation Age of Onset  . Cancer Neg Hx     Social History:  reports that he quit smoking about 7 years ago. His smoking use included Cigarettes. He smoked 0.50 packs per day. He has never used smokeless tobacco. He reports that he does not drink alcohol or use illicit drugs.  Review of Systems:  HYPERTENSION:  appears fairly well controlled with Lotrel and metoprolol  HYPERLIPIDEMIA: The  lipid abnormality consists of elevated LDL treated with simvastatin. History of BPH      Examination:   BP 144/80  Pulse 90  Temp(Src) 98.2 F (36.8 C)  Resp 12  Ht 5\' 9"  (1.753 m)  Wt 200 lb 8 oz (90.946 kg)  BMI 29.6 kg/m2  SpO2 95%  Body mass index is 29.6 kg/(m^2).   ASSESSMENT/ PLAN::   Diabetes type 2   The patient's diabetes control appears to be fairly good with overall readings at home about 150 average However he is having relatively low readings after breakfast at taking the same mealtime coverage for all meals He is also not adjusting his mealtime coverage based on blood sugar levels and amount of carbohydrate    Change in medications: He will use Levemir 40 units in the morning rather than at night to help with evening readings which are higher than in the morning. Consider using twice a day regimen if needed or switching to Lantus He will need to mark his readings after meals to identify these and this was shown to him He will need to reduce the Humalog in the morning and use more at suppertime to help with postprandial hyperglycemia Blood sugar targets were discussed Discussed balanced meals, increased exercise with walking daily to bring weight down further  Humalog 12 units at breakfast, 15 at lunch and 17 at supper Discussed avoiding hypoglycemia in postprandial targets His wife  will help him with day-to-day management Continue Victoza which has previously helped with control Counseling time over 50% of today's 25 minute visit  [name] 12/06/2012, 10:04 AM   Addendum: A1c 7.9 Potassium 3.1, he will contact PCP for management  Office Visit on 12/06/2012  Component Date Value Range Status  . Sodium 12/06/2012 139  135 - 145 mEq/L Final  . Potassium 12/06/2012 3.1* 3.5 - 5.1 mEq/L Final  . Chloride 12/06/2012 103  96 - 112 mEq/L Final  . CO2 12/06/2012 28  19 - 32 mEq/L Final  . Glucose, Bld 12/06/2012 143* 70 - 99 mg/dL Final  . BUN 04/54/0981 9  6 -  23 mg/dL Final  . Creatinine, Ser 12/06/2012 1.0  0.4 - 1.5  mg/dL Final  . Total Bilirubin 12/06/2012 0.8  0.3 - 1.2 mg/dL Final  . Alkaline Phosphatase 12/06/2012 57  39 - 117 U/L Final  . AST 12/06/2012 21  0 - 37 U/L Final  . ALT 12/06/2012 18  0 - 53 U/L Final  . Total Protein 12/06/2012 8.0  6.0 - 8.3 g/dL Final  . Albumin 40/98/1191 3.7  3.5 - 5.2 g/dL Final  . Calcium 47/82/9562 9.2  8.4 - 10.5 mg/dL Final  . GFR 13/11/6576 92.87  >60.00 mL/min Final  . Hemoglobin A1C 12/06/2012 7.9* 4.6 - 6.5 % Final   Glycemic Control Guidelines for People with Diabetes:Non Diabetic:  <6%Goal of Therapy: <7%Additional Action Suggested:  >8%   . Cholesterol 12/06/2012 147  0 - 200 mg/dL Final   ATP III Classification       Desirable:  < 200 mg/dL               Borderline High:  200 - 239 mg/dL          High:  > = 469 mg/dL  . Triglycerides 12/06/2012 90.0  0.0 - 149.0 mg/dL Final   Normal:  <629 mg/dLBorderline High:  150 - 199 mg/dL  . HDL 12/06/2012 36.10* >39.00 mg/dL Final  . VLDL 52/84/1324 18.0  0.0 - 40.0 mg/dL Final  . LDL Cholesterol 12/06/2012 93  0 - 99 mg/dL Final  . Total CHOL/HDL Ratio 12/06/2012 4   Final                  Men          Women1/2 Average Risk     3.4          3.3Average Risk          5.0          4.42X Average Risk          9.6          7.13X Average Risk          15.0          11.0

## 2012-12-06 NOTE — Progress Notes (Signed)
Quick Note:  Please let patient know that the lab result shows low potassium, need to forward to PCP for treatment ______

## 2012-12-06 NOTE — Patient Instructions (Addendum)
Levemir 40 units ON WAKING UP  Humalog 12 units at breakfast, 15 at lunch and 17 at supper  VICTOZA AT BEDTIME  Please check blood sugars at least half the time about 2 hours after any meal and as directed on waking up. Please bring blood sugar monitor to each visit  WALK DAILY

## 2012-12-08 ENCOUNTER — Telehealth: Payer: Self-pay | Admitting: *Deleted

## 2012-12-08 DIAGNOSIS — E7800 Pure hypercholesterolemia, unspecified: Secondary | ICD-10-CM | POA: Insufficient documentation

## 2012-12-08 NOTE — Telephone Encounter (Signed)
Pt is aware of results, labs were faxed to Dr. Cliffton Asters

## 2012-12-08 NOTE — Telephone Encounter (Signed)
Message copied by Hermenia Bers on Wed Dec 08, 2012  3:04 PM ------      Message from: Reather Littler      Created: Mon Dec 06, 2012  9:06 PM       Please let patient know that the lab result shows low potassium, need to forward to PCP for treatment ------

## 2012-12-08 NOTE — Telephone Encounter (Signed)
Pt is aware and labs have been faxed to Dr. Cliffton Asters

## 2012-12-08 NOTE — Telephone Encounter (Signed)
Message copied by Hermenia Bers on Wed Dec 08, 2012  3:12 PM ------      Message from: Reather Littler      Created: Mon Dec 06, 2012  9:06 PM       Please let patient know that the lab result shows low potassium, need to forward to PCP for treatment ------

## 2013-01-04 ENCOUNTER — Telehealth: Payer: Self-pay | Admitting: Endocrinology

## 2013-01-06 ENCOUNTER — Other Ambulatory Visit: Payer: Self-pay | Admitting: *Deleted

## 2013-01-06 MED ORDER — METFORMIN HCL 500 MG PO TABS: 500.0000 mg | ORAL_TABLET | Freq: Two times a day (BID) | ORAL | Status: DC

## 2013-01-06 MED ORDER — GLUCOSE BLOOD VI STRP: ORAL_STRIP | Status: DC

## 2013-01-14 ENCOUNTER — Telehealth: Payer: Self-pay | Admitting: Endocrinology

## 2013-01-14 NOTE — Telephone Encounter (Signed)
Needs insulin-Levemir samples / Sherri S.

## 2013-02-07 ENCOUNTER — Ambulatory Visit (INDEPENDENT_AMBULATORY_CARE_PROVIDER_SITE_OTHER): Payer: Medicare Other | Admitting: Endocrinology

## 2013-02-07 ENCOUNTER — Encounter: Payer: Self-pay | Admitting: Endocrinology

## 2013-02-07 VITALS — BP 128/82 | HR 59 | Temp 98.5°F | Resp 12 | Ht 70.5 in | Wt 199.2 lb

## 2013-02-07 DIAGNOSIS — E876 Hypokalemia: Secondary | ICD-10-CM

## 2013-02-07 DIAGNOSIS — IMO0001 Reserved for inherently not codable concepts without codable children: Secondary | ICD-10-CM

## 2013-02-07 DIAGNOSIS — E7800 Pure hypercholesterolemia, unspecified: Secondary | ICD-10-CM

## 2013-02-07 DIAGNOSIS — I10 Essential (primary) hypertension: Secondary | ICD-10-CM

## 2013-02-07 LAB — MICROALBUMIN / CREATININE URINE RATIO
Creatinine,U: 96 mg/dL
Microalb Creat Ratio: 12.7 mg/g (ref 0.0–30.0)
Microalb, Ur: 12.2 mg/dL — ABNORMAL HIGH (ref 0.0–1.9)

## 2013-02-07 LAB — URINALYSIS, ROUTINE W REFLEX MICROSCOPIC
Bilirubin Urine: NEGATIVE
Ketones, ur: NEGATIVE
Leukocytes, UA: NEGATIVE
Specific Gravity, Urine: 1.02 (ref 1.000–1.030)
Urine Glucose: NEGATIVE
Urobilinogen, UA: 0.2 (ref 0.0–1.0)

## 2013-02-07 NOTE — Progress Notes (Signed)
Patient ID: [name], male   DOB: [birth year], 72 y.o.   MRN: [id number]  [name] is an 72 y.o. male.   Reason for Appointment: Diabetes follow-up   History of Present Illness   Diagnosis: Type 2 DIABETES MELITUS, diagnosis 2003?     RECENT history: On his last visit he was having fairly good blood sugars except for relatively lower readings after breakfast in higher readings after supper His Humalog was reduced in the morning and increased at suppertime by 2 units each However not clear why his readings after supper are much higher. He now says that he is taking his evening Humalog 30 minutes after eating May possibly be taking his insulin after checking his postprandial reading Also ran out of Victoza 2 days ago However fasting readings are recently better  PAST history: He had been initially treated with various oral hypoglycemic drugs including metformin. Because of failure of control he was started on insulin in about 2007 Despite taking basal bolus insulin his A1c had been persistently over 8% and was 9.6% when first seen in consultation With starting Victoza in 12/2011 his blood sugars had improved significantly and A1c had come down to as low as 7% but was back up to 8.4% and 3/14  Oral hypoglycemic drugs: Low dose metformin        Side effects from medications: None Insulin regimen: Levemir 38 hs; Humalog 12 units at breakfast, 15 at lunch and 17 at supper      Proper timing of medications in relation to meals: Yes.          Monitors blood glucose:  2.6 times a day.    Glucometer: One Touch.          Blood Glucose readings from meter download: readings before breakfast: 92-172, median 144 Midmorning 97-182; 6 PM 289, 125, 90 and after 8 PM 170-296  Overall median 163, previously 150 Hypoglycemia frequency: None recently .          Meals: 3 meals per day.  For breakfast has eggs grits/toast, supper 7 PM Physical activity: exercise: None           Dietician visit:  2013 with diabetes nurse practitioner  Wt Readings from Last 3 Encounters:  02/07/13 199 lb 3.2 oz (90.357 kg)  12/06/12 200 lb 8 oz (90.946 kg)  07/29/11 200 lb 12.8 oz (91.082 kg)   Lab Results  Component Value Date   HGBA1C 7.9* 12/06/2012   HGBA1C 6.9* 12/30/2010   HGBA1C  Value: 10.7 (NOTE)   The ADA recommends the following therapeutic goal for glycemic   control related to Hgb A1C measurement:   Goal of Therapy:   < 7.0% Hgb A1C   Reference: American Diabetes Association: Clinical Practice   Recommendations 2008, Diabetes Care,  2008, 31:(Suppl 1).* 10/30/2007   Lab Results  Component Value Date   LDLCALC 93 12/06/2012   CREATININE 1.0 12/06/2012      Medication List       This list is accurate as of: 02/07/13 10:27 AM.  Always use your most recent med list.               amLODipine 10 MG tablet  Commonly known as:  NORVASC     amLODipine-benazepril 10-20 MG per capsule  Commonly known as:  LOTREL  Take 1 capsule by mouth daily.     aspirin 325 MG tablet  Take 325 mg by mouth daily.     benazepril 20 MG tablet  Commonly known as:  LOTENSIN     dorzolamide-timolol 22.3-6.8 MG/ML ophthalmic solution  Commonly known as:  COSOPT     ferrous fumarate 325 (106 FE) MG Tabs tablet  Commonly known as:  HEMOCYTE - 106 mg FE  Take 1 tablet by mouth 3 (three) times daily.     fish oil-omega-3 fatty acids 1000 MG capsule  Take 2 g by mouth daily.     glucose blood test strip  Commonly known as:  ONE TOUCH ULTRA TEST  Use to check blood sugars 3 times per day  Dx code 250.00     insulin detemir 100 UNIT/ML injection  Commonly known as:  LEVEMIR  Inject 38 Units into the skin daily.     insulin lispro 100 UNIT/ML injection  Commonly known as:  HUMALOG  Inject 12-17 Units into the skin 3 (three) times daily before meals.     metFORMIN 500 MG tablet  Commonly known as:  GLUCOPHAGE  Take 500 mg by mouth daily. Take 2 tablets once a day     metoprolol succinate 25  MG 24 hr tablet  Commonly known as:  TOPROL-XL  Take 25 mg by mouth daily.     metoprolol tartrate 25 MG tablet  Commonly known as:  LOPRESSOR     nitrofurantoin 100 MG capsule  Commonly known as:  MACRODANTIN  Take 100 mg by mouth 4 (four) times daily.     phenazopyridine 200 MG tablet  Commonly known as:  PYRIDIUM  Take 200 mg by mouth 3 (three) times daily as needed.     simvastatin 40 MG tablet  Commonly known as:  ZOCOR  Take 20 mg by mouth every evening.     tamsulosin 0.4 MG Caps capsule  Commonly known as:  FLOMAX  Take by mouth.     TRAVATAN Z 0.004 % Soln ophthalmic solution  Generic drug:  Travoprost (BAK Free)     VICTOZA 18 MG/3ML Sopn  Generic drug:  Liraglutide  Inject 1.2 mg into the skin.        Allergies: No Known Allergies  Past Medical History  Diagnosis Date  . Hypertension   . Diabetes mellitus     insulin dependent  . Hypercholesterolemia   . Sarcoidosis   . Unspecified glaucoma(365.9)   . Heart disease, unspecified     Past Surgical History  Procedure Laterality Date  . Knee replacement surgery      right  . Cataract extraction      Family History  Problem Relation Age of Onset  . Cancer Neg Hx     Social History:  reports that he quit smoking about 7 years ago. His smoking use included Cigarettes. He smoked 0.50 packs per day. He has never used smokeless tobacco. He reports that he does not drink alcohol or use illicit drugs.  Review of Systems:  HYPERTENSION:  appears fairly well controlled with Lotrel and metoprolol  HYPERLIPIDEMIA: The lipid abnormality consists of elevated LDL treated with simvastatin.   Lab Results  Component Value Date   LDLCALC 93 12/06/2012   History of BPH      Examination:   BP 128/82  Pulse 59  Temp(Src) 98.5 F (36.9 C)  Resp 12  Ht 5' 10.5" (1.791 m)  Wt 199 lb 3.2 oz (90.357 kg)  BMI 28.17 kg/m2  SpO2 94%  Body mass index is 28.17 kg/(m^2).   ASSESSMENT/ PLAN::   Diabetes type  2   The patient's diabetes control appears to  be somewhat worse with high readings especially in the evening when his average blood sugar is over 200 after supper. This is despite taking Victoza and mealtime insulin. As discussed in history of present illness he is not taking his evening insulin before eating and at least 30 minutes after the meal He is also not adjusting his mealtime coverage based on blood sugar levels and amount of carbohydrate    Change in medications: He will use Levemir 40 units in the morning rather than at night to help with evening readings which are higher than in the morning. Consider using twice a day regimen if needed or switching to Lantus He will need to mark his readings after meals to identify these and this was shown to him Blood sugar targets were discussed Discussed balanced meals, increased exercise with walking daily to bring weight down further  Humalog 12 units at breakfast, 15 at lunch and 20 at supper Discussed avoiding hypoglycemia in postprandial targets Continue Victoza which has previously helped with control  He will again be sent for diabetes education especially mealtime and help with day-to-day insulin management  Counseling time over 50% of today's 25 minute visit  Jaelen Soth 02/07/2013, 10:27 AM

## 2013-02-07 NOTE — Patient Instructions (Addendum)
TAKE HUMALOG 5-10 MIN BEFORE EATING  HUMALOG 20- AT SUPPER  LEVEMIR ON WAKING UP, 40 UNITS  Walk DAILY

## 2013-02-23 ENCOUNTER — Encounter: Payer: Medicare Other | Admitting: Nutrition

## 2013-03-08 ENCOUNTER — Telehealth: Payer: Self-pay | Admitting: Endocrinology

## 2013-03-08 ENCOUNTER — Other Ambulatory Visit: Payer: Self-pay | Admitting: *Deleted

## 2013-03-08 MED ORDER — INSULIN DETEMIR 100 UNIT/ML ~~LOC~~ SOLN: 38.0000 [IU] | Freq: Every day | SUBCUTANEOUS | Status: DC

## 2013-03-08 NOTE — Telephone Encounter (Signed)
rx sent

## 2013-03-08 NOTE — Telephone Encounter (Signed)
PT WOULD LIKE REFILL ON LEVEMIR SENT TO [facility] THANKS

## 2013-04-08 ENCOUNTER — Other Ambulatory Visit: Payer: Self-pay | Admitting: *Deleted

## 2013-04-08 MED ORDER — INSULIN LISPRO 100 UNIT/ML ~~LOC~~ SOLN: SUBCUTANEOUS | Status: DC

## 2013-04-12 ENCOUNTER — Other Ambulatory Visit: Payer: Self-pay | Admitting: *Deleted

## 2013-04-12 MED ORDER — INSULIN DETEMIR 100 UNIT/ML FLEXPEN: PEN_INJECTOR | SUBCUTANEOUS | Status: DC

## 2013-04-13 ENCOUNTER — Other Ambulatory Visit: Payer: Self-pay | Admitting: *Deleted

## 2013-04-13 MED ORDER — LIRAGLUTIDE 18 MG/3ML ~~LOC~~ SOPN: 1.2000 mg | PEN_INJECTOR | Freq: Every day | SUBCUTANEOUS | Status: DC

## 2013-05-06 ENCOUNTER — Other Ambulatory Visit (INDEPENDENT_AMBULATORY_CARE_PROVIDER_SITE_OTHER): Payer: Medicare HMO

## 2013-05-06 DIAGNOSIS — E1165 Type 2 diabetes mellitus with hyperglycemia: Principal | ICD-10-CM

## 2013-05-06 DIAGNOSIS — IMO0001 Reserved for inherently not codable concepts without codable children: Secondary | ICD-10-CM

## 2013-05-06 DIAGNOSIS — E7800 Pure hypercholesterolemia, unspecified: Secondary | ICD-10-CM

## 2013-05-06 LAB — COMPREHENSIVE METABOLIC PANEL
ALBUMIN: 3.7 g/dL (ref 3.5–5.2)
ALK PHOS: 66 U/L (ref 39–117)
ALT: 18 U/L (ref 0–53)
AST: 21 U/L (ref 0–37)
BUN: 8 mg/dL (ref 6–23)
CO2: 30 mEq/L (ref 19–32)
Calcium: 9.9 mg/dL (ref 8.4–10.5)
Chloride: 102 mEq/L (ref 96–112)
Creatinine, Ser: 1.2 mg/dL (ref 0.4–1.5)
GFR: 74.74 mL/min (ref >60.00)
Glucose, Bld: 137 mg/dL — ABNORMAL HIGH (ref 70–99)
POTASSIUM: 3.6 meq/L (ref 3.5–5.1)
SODIUM: 140 meq/L (ref 135–145)
TOTAL PROTEIN: 7.9 g/dL (ref 6.0–8.3)
Total Bilirubin: 0.7 mg/dL (ref 0.3–1.2)

## 2013-05-06 LAB — LIPID PANEL
CHOL/HDL RATIO: 4
Cholesterol: 154 mg/dL (ref 0–200)
HDL: 39.8 mg/dL (ref >39.00)
LDL CALC: 92 mg/dL (ref 0–99)
Triglycerides: 111 mg/dL (ref 0.0–149.0)
VLDL: 22.2 mg/dL (ref 0.0–40.0)

## 2013-05-06 LAB — HEMOGLOBIN A1C: Hgb A1c MFr Bld: 8.5 % — ABNORMAL HIGH (ref 4.6–6.5)

## 2013-05-10 ENCOUNTER — Ambulatory Visit (INDEPENDENT_AMBULATORY_CARE_PROVIDER_SITE_OTHER): Payer: Commercial Managed Care - HMO | Admitting: Endocrinology

## 2013-05-10 ENCOUNTER — Encounter: Payer: Self-pay | Admitting: Endocrinology

## 2013-05-10 VITALS — BP 118/60 | HR 86 | Temp 98.2°F | Resp 14 | Ht 70.5 in | Wt 202.3 lb

## 2013-05-10 DIAGNOSIS — E1165 Type 2 diabetes mellitus with hyperglycemia: Principal | ICD-10-CM

## 2013-05-10 DIAGNOSIS — IMO0001 Reserved for inherently not codable concepts without codable children: Secondary | ICD-10-CM

## 2013-05-10 NOTE — Progress Notes (Signed)
Patient ID: [name], male   DOB: 05-Aug-1940, 73 y.o.   MRN: [id number]  Reason for Appointment: Diabetes follow-up   History of Present Illness   Diagnosis: Type 2 DIABETES MELITUS, diagnosis 2003?  PREVIOUS history: He had been initially treated with various oral hypoglycemic drugs including metformin. Because of failure of control he was started on insulin in about 2007 Despite taking basal bolus insulin his A1c had been persistently over 8% and was 9.6% when first seen in consultation With starting Victoza in 12/2011 his blood sugars had improved significantly and A1c had come down to as low as 7% but was back up to 8.4% and 3/14     RECENT history: His blood sugars are overall poorly controlled now with higher A1c  This is likely to be from noncompliance with his insulin. He says he could not afford his insulin and has been taking it sporadically. Started back on Levemir in the last week His sugars are overall highest late in the evening and some overnight However in the last 2-4 days his blood sugars are improving especially fasting Some of his hyperglycemia may be from inconsistent use of Victoza which has helped him previously Is taking all his doses as directed now this week A.m. glucose recently has been in the 108-131 range and postprandial readings are under 200 since 1/16  Oral hypoglycemic drugs: Low dose metformin        Side effects from medications: None Insulin regimen: Levemir 38 hs; Humalog 12 units at breakfast, 15 at lunch and 17 at supper      Proper timing of medications in relation to meals: Usually taking before meal.          Monitors blood glucose:  2.6 times a day.    Glucometer: One Touch.          Blood Glucose readings from meter download:   PREMEAL Breakfast Lunch Dinner Bedtime  overnight   Glucose range:  86-229   83-203    168-288   148-329   Mean/median:  140     267   193    POST-MEAL PC Breakfast PC Lunch PC Dinner  Glucose range:    73-321    Mean/median:      Hypoglycemia frequency: None recently .          Meals: 3 meals per day.  For breakfast has eggs grits/toast, supper 7 PM Physical activity: exercise: None           Dietician visit: 2013 with diabetes nurse practitioner  Wt Readings from Last 3 Encounters:  05/10/13 202 lb 4.8 oz (91.763 kg)  02/07/13 199 lb 3.2 oz (90.357 kg)  12/06/12 200 lb 8 oz (90.946 kg)   Lab Results  Component Value Date   HGBA1C 8.5* 05/06/2013   HGBA1C 7.9* 12/06/2012   HGBA1C 6.9* 12/30/2010   Lab Results  Component Value Date   MICROALBUR 12.2* 02/07/2013   [place] 92 05/06/2013   CREATININE 1.2 05/06/2013      Medication List       This list is accurate as of: 05/10/13 11:37 AM.  Always use your most recent med list.               amLODipine 10 MG tablet  Commonly known as:  NORVASC     amLODipine-benazepril 10-20 MG per capsule  Commonly known as:  LOTREL  Take 1 capsule by mouth daily.     aspirin 325 MG tablet  Take 325 mg  by mouth daily.     benazepril 20 MG tablet  Commonly known as:  LOTENSIN     dorzolamide-timolol 22.3-6.8 MG/ML ophthalmic solution  Commonly known as:  COSOPT     ferrous fumarate 325 (106 FE) MG Tabs tablet  Commonly known as:  HEMOCYTE - 106 mg FE  Take 1 tablet by mouth 3 (three) times daily.     fish oil-omega-3 fatty acids 1000 MG capsule  Take 2 g by mouth daily.     glucose blood test strip  Commonly known as:  ONE TOUCH ULTRA TEST  Use to check blood sugars 3 times per day  Dx code 250.00     Insulin Detemir 100 UNIT/ML Pen  Commonly known as:  LEVEMIR FLEXTOUCH  Inject 38 units daily     insulin lispro 100 UNIT/ML injection  Commonly known as:  HUMALOG  Inject 20 units daily     Liraglutide 18 MG/3ML Sopn  Commonly known as:  VICTOZA  Inject 1.2 mg into the skin daily.     metFORMIN 500 MG tablet  Commonly known as:  GLUCOPHAGE  Take 500 mg by mouth daily. Take 2 tablets once a day     metoprolol succinate 25 MG  24 hr tablet  Commonly known as:  TOPROL-XL  Take 25 mg by mouth daily.     metoprolol tartrate 25 MG tablet  Commonly known as:  LOPRESSOR     nitrofurantoin 100 MG capsule  Commonly known as:  MACRODANTIN  Take 100 mg by mouth 4 (four) times daily.     phenazopyridine 200 MG tablet  Commonly known as:  PYRIDIUM  Take 200 mg by mouth 3 (three) times daily as needed.     simvastatin 40 MG tablet  Commonly known as:  ZOCOR  Take 20 mg by mouth every evening.     tamsulosin 0.4 MG Caps capsule  Commonly known as:  FLOMAX  Take by mouth.     TRAVATAN Z 0.004 % Soln ophthalmic solution  Generic drug:  Travoprost (BAK Free)        Allergies: No Known Allergies  Past Medical History  Diagnosis Date  . Hypertension   . Diabetes mellitus     insulin dependent  . Hypercholesterolemia   . Sarcoidosis   . Unspecified glaucoma   . Heart disease, unspecified     Past Surgical History  Procedure Laterality Date  . Knee replacement surgery      right  . Cataract extraction      Family History  Problem Relation Age of Onset  . Cancer Neg Hx     Social History:  reports that he quit smoking about 8 years ago. His smoking use included Cigarettes. He smoked 0.50 packs per day. He has never used smokeless tobacco. He reports that he does not drink alcohol or use illicit drugs.  Review of Systems:  HYPERTENSION:  appears fairly well controlled with Lotrel and metoprolol  HYPERLIPIDEMIA: The lipid abnormality consists of elevated LDL treated with simvastatin.   Lab Results  Component Value Date   LDLCALC 92 05/06/2013   History of BPH      Examination:   BP 118/60  Pulse 86  Temp(Src) 98.2 F (36.8 C)  Resp 14  Ht 5' 10.5" (1.791 m)  Wt 202 lb 4.8 oz (91.763 kg)  BMI 28.61 kg/m2  SpO2 93%  Body mass index is 28.61 kg/(m^2).   ASSESSMENT/ PLAN::   Diabetes type 2   The patient's diabetes  control appears overall worse because of noncompliance with getting  his insulin and Victoza Recently his sugars are improving especially fasting He will continue the same regimen Also discussed being consistent with diet and avoiding late night snacks and sweets He can have some sweets snacks with his meal if he reduces carbohydrate intake overall Also encouraged him to start regular walking for exercise  [facility] 05/10/2013, 11:37 AM

## 2013-05-10 NOTE — Patient Instructions (Addendum)
Humalog 10 units at breakfast  Please check blood sugars at least half the time about 2 hours after any meal and as directed on waking up. Please bring blood sugar monitor to each visit  Walk daily

## 2013-05-24 ENCOUNTER — Other Ambulatory Visit: Payer: Self-pay | Admitting: *Deleted

## 2013-05-30 ENCOUNTER — Other Ambulatory Visit: Payer: Self-pay | Admitting: *Deleted

## 2013-05-30 MED ORDER — INSULIN DETEMIR 100 UNIT/ML FLEXPEN: PEN_INJECTOR | SUBCUTANEOUS | Status: DC

## 2013-05-30 MED ORDER — LIRAGLUTIDE 18 MG/3ML ~~LOC~~ SOPN: 1.2000 mg | PEN_INJECTOR | Freq: Every day | SUBCUTANEOUS | Status: DC

## 2013-06-02 ENCOUNTER — Other Ambulatory Visit: Payer: Self-pay | Admitting: *Deleted

## 2013-06-02 MED ORDER — GLUCOSE BLOOD VI STRP: ORAL_STRIP | Status: DC

## 2013-06-13 ENCOUNTER — Other Ambulatory Visit: Payer: Self-pay | Admitting: *Deleted

## 2013-06-13 MED ORDER — INSULIN LISPRO 100 UNIT/ML ~~LOC~~ SOLN: SUBCUTANEOUS | Status: DC

## 2013-06-13 MED ORDER — METFORMIN HCL 500 MG PO TABS: ORAL_TABLET | ORAL | Status: DC

## 2013-06-15 ENCOUNTER — Other Ambulatory Visit: Payer: Self-pay | Admitting: *Deleted

## 2013-07-08 ENCOUNTER — Other Ambulatory Visit: Payer: Medicare Other

## 2013-07-11 ENCOUNTER — Other Ambulatory Visit (INDEPENDENT_AMBULATORY_CARE_PROVIDER_SITE_OTHER): Payer: Medicare HMO

## 2013-07-11 DIAGNOSIS — IMO0001 Reserved for inherently not codable concepts without codable children: Secondary | ICD-10-CM

## 2013-07-11 DIAGNOSIS — E1165 Type 2 diabetes mellitus with hyperglycemia: Principal | ICD-10-CM

## 2013-07-11 LAB — GLUCOSE, RANDOM: Glucose, Bld: 124 mg/dL — ABNORMAL HIGH (ref 70–99)

## 2013-07-13 ENCOUNTER — Ambulatory Visit (INDEPENDENT_AMBULATORY_CARE_PROVIDER_SITE_OTHER): Payer: Commercial Managed Care - HMO | Admitting: Endocrinology

## 2013-07-13 ENCOUNTER — Encounter: Payer: Self-pay | Admitting: Endocrinology

## 2013-07-13 VITALS — BP 134/70 | HR 98 | Temp 98.3°F | Resp 16 | Ht 70.5 in | Wt 209.2 lb

## 2013-07-13 DIAGNOSIS — IMO0001 Reserved for inherently not codable concepts without codable children: Secondary | ICD-10-CM

## 2013-07-13 DIAGNOSIS — E1165 Type 2 diabetes mellitus with hyperglycemia: Principal | ICD-10-CM

## 2013-07-13 NOTE — Patient Instructions (Addendum)
Avoid sweet drinks  Walk daily  Please check blood sugars at least half the time about 2 hours after any meal and as directed on waking up. Please bring blood sugar monitor to each visit

## 2013-07-13 NOTE — Progress Notes (Signed)
Patient ID: [name], male   DOB: 1941/02/04, 73 y.o.   MRN: [id number]   Reason for Appointment: Diabetes follow-up   History of Present Illness   Diagnosis: Type 2 DIABETES MELITUS, diagnosis 2003?  PREVIOUS history: He had been initially treated with various oral hypoglycemic drugs including metformin. Because of failure of control he was started on insulin in about 2007 Despite taking basal bolus insulin his A1c had been persistently over 8% and was 9.6% when first seen in consultation With starting Victoza in 12/2011 his blood sugars had improved significantly and A1c had come down to as low as 7% but was back up to 8.4% and 3/14     RECENT history: His blood sugars on his last visit were overall poorly controlled  with higher A1c  With starting to take insulin more regularly his blood sugars appear to be better although inconsistent He has been given at least his Levemir insulin and Victoza through the patient assistance program and is reporting taking both of them daily Taking Levemir in the morning and Victoza at night Glucose patterns: Blood sugars are overall mildly increased in the morning but somewhat variable Blood sugars at suppertime are also variable but mostly higher, not clear if some readings are after eating Hyperglycemia:  This is likely to be from noncompliance with his diet since his wife thinks he tends to go off the diet especially when eating sweets.  He thinks he is taking his insulin consistently even though he has difficulty affording the Humalog The fructosamine is overall slightly high at 330  Oral hypoglycemic drugs: 1 g metformin        Side effects from medications: None Insulin regimen: Levemir 38 am; Humalog 12 units at breakfast, 15 at lunch and 20 at supper      Proper timing of medications in relation to meals: Usually taking before meal.          Monitors blood glucose:  2.6 times a day.    Glucometer: One Touch.          Blood  Glucose readings from meter download:   PREMEAL Breakfast Lunch Dinner Bedtime Overall  Glucose range:  71-235  ?   89-322   69, 230    Mean/median:  155    165    160    Hypoglycemia frequency: On 3/23 at 10:40 a.m., glucose was 55 .          Meals: 3 meals per day.  For breakfast has eggs grits/toast, supper 7 PM Physical activity: exercise: None           Dietician visit: 2013 with diabetes nurse practitioner  Wt Readings from Last 3 Encounters:  07/13/13 209 lb 3.2 oz (94.892 kg)  05/10/13 202 lb 4.8 oz (91.763 kg)  02/07/13 199 lb 3.2 oz (90.357 kg)   Lab Results  Component Value Date   HGBA1C 8.5* 05/06/2013   HGBA1C 7.9* 12/06/2012   HGBA1C 6.9* 12/30/2010   Lab Results  Component Value Date   MICROALBUR 12.2* 02/07/2013   [place] 92 05/06/2013   CREATININE 1.2 05/06/2013      Medication List       This list is accurate as of: 07/13/13 11:37 AM.  Always use your most recent med list.               amLODipine-benazepril 10-20 MG per capsule  Commonly known as:  LOTREL  Take 1 capsule by mouth daily.  dorzolamide-timolol 22.3-6.8 MG/ML ophthalmic solution  Commonly known as:  COSOPT     ferrous fumarate 325 (106 FE) MG Tabs tablet  Commonly known as:  HEMOCYTE - 106 mg FE  Take 1 tablet by mouth 3 (three) times daily.     fish oil-omega-3 fatty acids 1000 MG capsule  Take 2 g by mouth daily.     glucose blood test strip  Commonly known as:  ONE TOUCH ULTRA TEST  Use as instructed to check blood sugars 3 times per day dx code 250.02     Insulin Detemir 100 UNIT/ML Pen  Commonly known as:  LEVEMIR FLEXTOUCH  Inject 38 units daily     insulin lispro 100 UNIT/ML injection  Commonly known as:  HUMALOG  Inject 20 units daily     Liraglutide 18 MG/3ML Sopn  Commonly known as:  VICTOZA  Inject 1.2 mg into the skin daily.     metFORMIN 500 MG tablet  Commonly known as:  GLUCOPHAGE  Take 2 tablets once a day     metoprolol succinate 25 MG 24 hr tablet   Commonly known as:  TOPROL-XL  Take 25 mg by mouth daily.     metoprolol tartrate 25 MG tablet  Commonly known as:  LOPRESSOR     nitrofurantoin 100 MG capsule  Commonly known as:  MACRODANTIN  Take 100 mg by mouth 4 (four) times daily.     phenazopyridine 200 MG tablet  Commonly known as:  PYRIDIUM  Take 200 mg by mouth 3 (three) times daily as needed.     simvastatin 40 MG tablet  Commonly known as:  ZOCOR  Take 20 mg by mouth every evening.     tamsulosin 0.4 MG Caps capsule  Commonly known as:  FLOMAX  Take by mouth.     TRAVATAN Z 0.004 % Soln ophthalmic solution  Generic drug:  Travoprost (BAK Free)        Allergies: No Known Allergies  Past Medical History  Diagnosis Date  . Hypertension   . Diabetes mellitus     insulin dependent  . Hypercholesterolemia   . Sarcoidosis   . Unspecified glaucoma   . Heart disease, unspecified     Past Surgical History  Procedure Laterality Date  . Knee replacement surgery      right  . Cataract extraction      Family History  Problem Relation Age of Onset  . Cancer Neg Hx     Social History:  reports that he quit smoking about 8 years ago. His smoking use included Cigarettes. He smoked 0.50 packs per day. He has never used smokeless tobacco. He reports that he does not drink alcohol or use illicit drugs.  Review of Systems:  HYPERTENSION:  appears fairly well controlled with Lotrel and metoprolol  HYPERLIPIDEMIA: The lipid abnormality consists of elevated LDL treated with simvastatin.   Lab Results  Component Value Date   LDLCALC 92 05/06/2013   History of BPH  No numbness or tingling in the feet      Examination:   BP 134/70  Pulse 98  Temp(Src) 98.3 F (36.8 C)  Resp 16  Ht 5' 10.5" (1.791 m)  Wt 209 lb 3.2 oz (94.892 kg)  BMI 29.58 kg/m2  SpO2 91%  Body mass index is 29.58 kg/(m^2).   He has 1+ left lower leg and ankle edema Foot exam done  ASSESSMENT/ PLAN::   Diabetes type 2   The  patient's diabetes control appears overall slightly  better with improved compliance with his insulin and Victoza However he has significant variability in blood sugars especially in the evening This is most likely related to poor compliance with diet as discussed in history of present illness He needs to have his A1c at least below 7.5, fructosamine still indicates relatively high readings He has been refusing to see the dietitian because of cost Also not exercising much  Recommendations made today:  Improve diet with reducing carbohydrates, sweets and drinks with sugar  Consultation with dietitian  Start regular walking  Continue same doses of medications  More readings after meals  May consider increasing dose of metformin on the next visit  Advised elastic stockings for left leg dependent edema  He was also checked to make sure he is not taking amlodipine in addition to his Lotrel  Counseling time over 50% of today's 25 minute visit  Gustavia Carie 07/13/2013, 11:37 AM

## 2013-07-22 LAB — SPECIMEN STATUS REPORT

## 2013-07-22 LAB — FRUCTOSAMINE: FRUCTOSAMINE: 330 umol/L — AB (ref 0–285)

## 2013-09-13 ENCOUNTER — Other Ambulatory Visit: Payer: Medicare HMO

## 2013-09-15 ENCOUNTER — Encounter: Payer: Self-pay | Admitting: Endocrinology

## 2013-09-15 ENCOUNTER — Ambulatory Visit (INDEPENDENT_AMBULATORY_CARE_PROVIDER_SITE_OTHER): Payer: Medicare HMO | Admitting: Endocrinology

## 2013-09-15 VITALS — BP 132/78 | HR 95 | Temp 98.0°F | Resp 16 | Ht 70.5 in | Wt 204.4 lb

## 2013-09-15 DIAGNOSIS — IMO0001 Reserved for inherently not codable concepts without codable children: Secondary | ICD-10-CM

## 2013-09-15 DIAGNOSIS — I10 Essential (primary) hypertension: Secondary | ICD-10-CM

## 2013-09-15 DIAGNOSIS — E1165 Type 2 diabetes mellitus with hyperglycemia: Principal | ICD-10-CM

## 2013-09-15 LAB — COMPREHENSIVE METABOLIC PANEL
ALT: 23 U/L (ref 0–53)
AST: 25 U/L (ref 0–37)
Albumin: 3.7 g/dL (ref 3.5–5.2)
Alkaline Phosphatase: 54 U/L (ref 39–117)
BILIRUBIN TOTAL: 0.8 mg/dL (ref 0.2–1.2)
BUN: 10 mg/dL (ref 6–23)
CO2: 27 meq/L (ref 19–32)
Calcium: 9.2 mg/dL (ref 8.4–10.5)
Chloride: 104 mEq/L (ref 96–112)
Creatinine, Ser: 1.2 mg/dL (ref 0.4–1.5)
GFR: 80.69 mL/min (ref >60.00)
Glucose, Bld: 99 mg/dL (ref 70–99)
Potassium: 2.9 mEq/L — ABNORMAL LOW (ref 3.5–5.1)
Sodium: 140 mEq/L (ref 135–145)
TOTAL PROTEIN: 7.7 g/dL (ref 6.0–8.3)

## 2013-09-15 LAB — LIPID PANEL
CHOL/HDL RATIO: 4
Cholesterol: 130 mg/dL (ref 0–200)
HDL: 32 mg/dL — ABNORMAL LOW (ref >39.00)
LDL Cholesterol: 83 mg/dL (ref 0–99)
Triglycerides: 73 mg/dL (ref 0.0–149.0)
VLDL: 14.6 mg/dL (ref 0.0–40.0)

## 2013-09-15 LAB — HEMOGLOBIN A1C: HEMOGLOBIN A1C: 8.1 % — AB (ref 4.6–6.5)

## 2013-09-15 LAB — MICROALBUMIN / CREATININE URINE RATIO
CREATININE, U: 176.7 mg/dL
MICROALB UR: 7 mg/dL — AB (ref 0.0–1.9)
Microalb Creat Ratio: 4 mg/g (ref 0.0–30.0)

## 2013-09-15 MED ORDER — METFORMIN HCL 1000 MG PO TABS: ORAL_TABLET | ORAL | Status: DC

## 2013-09-15 NOTE — Patient Instructions (Addendum)
Metformin 1 in am and 2 at supper  Exercise bike daily

## 2013-09-15 NOTE — Progress Notes (Signed)
Patient ID: [name], male   DOB: 01/29/1941, 73 y.o.   MRN: [id number]   Reason for Appointment: Diabetes follow-up   History of Present Illness   Diagnosis: Type 2 DIABETES MELITUS, diagnosis 2003?  PREVIOUS history: He had been initially treated with various oral hypoglycemic drugs including metformin. Because of failure of control he was started on insulin in about 2007 Despite taking basal bolus insulin his A1c had been persistently over 8% and was 9.6% when first seen in consultation With starting Victoza in 12/2011 his blood sugars had improved significantly and A1c had come down to as low as 7% but was back up to 8.4% and 3/14     RECENT history:  In 3/15 his blood sugars had appeared better with improved compliance with insulin doses and Victoza; fructosamine was 330 He was recommended consultation with dietitian to help with control but he refused Also even though he was encouraged to start walking and exercising he has not done this Blood sugars are generally good in the mornings but sometimes as high as 212 at night A1c not available today His metformin dose is only 1000 mg a day currently, has normal renal function Glucose patterns: Blood sugars are overall mildly increased in the morning averaging about 150 early morning although as low as 65 at 4 AM Blood sugars in the afternoon or significantly better but towards suppertime and later the are variable and sometimes high Hyperglycemia:  This is likely to be from noncompliance with his diet   Oral hypoglycemic drugs: 1 g metformin        Side effects from medications: None Insulin regimen: Levemir 38 am; Humalog 12 units at breakfast, 15 at lunch and 20 at supper      Proper timing of medications in relation to meals: Usually taking before meal.          Monitors blood glucose:  2.6 times a day.    Glucometer: One Touch.          Blood Glucose readings from meter download:  PREMEAL Breakfast Lunch Dinner   PCS  Overall  Glucose range:  103-171    97-165   124-212    Mean/median:  140      142    Hypoglycemia frequency: On 5/17 his glucose was 65 at 4 AM .          Meals: 3 meals per day.  For breakfast has eggs grits/toast, supper around 7 PM Physical activity: exercise: None           Dietician visit: 2013 with diabetes nurse practitioner  Wt Readings from Last 3 Encounters:  09/15/13 204 lb 6.4 oz (92.715 kg)  07/13/13 209 lb 3.2 oz (94.892 kg)  05/10/13 202 lb 4.8 oz (91.763 kg)   Lab Results  Component Value Date   HGBA1C 8.5* 05/06/2013   HGBA1C 7.9* 12/06/2012   HGBA1C 6.9* 12/30/2010   Lab Results  Component Value Date   MICROALBUR 12.2* 02/07/2013   [place] 92 05/06/2013   CREATININE 1.2 05/06/2013      Medication List       This list is accurate as of: 09/15/13 11:25 AM.  Always use your most recent med list.               amLODipine-benazepril 10-20 MG per capsule  Commonly known as:  LOTREL  Take 1 capsule by mouth daily.     dorzolamide-timolol 22.3-6.8 MG/ML ophthalmic solution  Commonly known as:  COSOPT  ferrous fumarate 325 (106 FE) MG Tabs tablet  Commonly known as:  HEMOCYTE - 106 mg FE  Take 1 tablet by mouth 3 (three) times daily.     fish oil-omega-3 fatty acids 1000 MG capsule  Take 2 g by mouth daily.     glucose blood test strip  Commonly known as:  ONE TOUCH ULTRA TEST  Use as instructed to check blood sugars 3 times per day dx code 250.02     Insulin Detemir 100 UNIT/ML Pen  Commonly known as:  LEVEMIR FLEXTOUCH  Inject 38 units daily     insulin lispro 100 UNIT/ML injection  Commonly known as:  HUMALOG  Inject 20 units daily     Liraglutide 18 MG/3ML Sopn  Commonly known as:  VICTOZA  Inject 1.2 mg into the skin daily.     metFORMIN 500 MG tablet  Commonly known as:  GLUCOPHAGE  Take 2 tablets once a day     metoprolol succinate 25 MG 24 hr tablet  Commonly known as:  TOPROL-XL  Take 25 mg by mouth daily.     metoprolol  tartrate 25 MG tablet  Commonly known as:  LOPRESSOR     nitrofurantoin 100 MG capsule  Commonly known as:  MACRODANTIN  Take 100 mg by mouth 4 (four) times daily.     phenazopyridine 200 MG tablet  Commonly known as:  PYRIDIUM  Take 200 mg by mouth 3 (three) times daily as needed.     simvastatin 40 MG tablet  Commonly known as:  ZOCOR  Take 20 mg by mouth every evening.     tamsulosin 0.4 MG Caps capsule  Commonly known as:  FLOMAX  Take by mouth.     TRAVATAN Z 0.004 % Soln ophthalmic solution  Generic drug:  Travoprost (BAK Free)        Allergies: No Known Allergies  Past Medical History  Diagnosis Date  . Hypertension   . Diabetes mellitus     insulin dependent  . Hypercholesterolemia   . Sarcoidosis   . Unspecified glaucoma   . Heart disease, unspecified     Past Surgical History  Procedure Laterality Date  . Knee replacement surgery      right  . Cataract extraction      Family History  Problem Relation Age of Onset  . Cancer Neg Hx     Social History:  reports that he quit smoking about 8 years ago. His smoking use included Cigarettes. He smoked 0.50 packs per day. He has never used smokeless tobacco. He reports that he does not drink alcohol or use illicit drugs.  Review of Systems:  HYPERTENSION:  appears fairly well controlled with Lotrel and metoprolol  HYPERLIPIDEMIA: The lipid abnormality consists of elevated LDL treated with simvastatin.   Lab Results  Component Value Date   LDLCALC 92 05/06/2013   History of BPH  No numbness or tingling in the feet Foot exam done in 3/15     Examination:   BP 132/78  Pulse 95  Temp(Src) 98 F (36.7 C)  Resp 16  Ht 5' 10.5" (1.791 m)  Wt 204 lb 6.4 oz (92.715 kg)  BMI 28.90 kg/m2  SpO2 92%  Body mass index is 28.9 kg/(m^2).     ASSESSMENT/ PLAN:   Diabetes type 2   The patient's diabetes control appears overall slightly better with blood sugars averaging about 140 at home However he  has some variability in blood sugars especially in the evening This  is most likely related to inconsistent compliance with diet as discussed in history of present illness Also not exercising much and encouraged him to start walking regularly to help with diabetes control and weight A1c to be checked today Meanwhile will continue same regimen of insulin Metformin will be increased to 1500 mg  Hypertension: Well controlled  [name] 09/15/2013, 11:25 AM

## 2013-09-20 ENCOUNTER — Other Ambulatory Visit: Payer: Self-pay | Admitting: *Deleted

## 2013-09-20 MED ORDER — POTASSIUM CHLORIDE CRYS ER 20 MEQ PO TBCR: EXTENDED_RELEASE_TABLET | ORAL | Status: DC

## 2013-10-27 ENCOUNTER — Other Ambulatory Visit: Payer: Self-pay | Admitting: *Deleted

## 2013-10-27 ENCOUNTER — Telehealth: Payer: Self-pay | Admitting: Endocrinology

## 2013-10-27 MED ORDER — INSULIN LISPRO 100 UNIT/ML ~~LOC~~ SOLN: SUBCUTANEOUS | Status: DC

## 2013-10-27 NOTE — Telephone Encounter (Signed)
Noted, rx sent.

## 2013-10-27 NOTE — Telephone Encounter (Signed)
In his chart it says 20 units daily, in your notes you say to continue the same dose.   Please advise

## 2013-10-27 NOTE — Telephone Encounter (Signed)
Humalog needs to be called into [facility] with the new dosage of 3 times daily 20 u in AM 12 u at lunch 20 at night if needed

## 2013-10-27 NOTE — Telephone Encounter (Signed)
Humalog 12 units at breakfast, 15 at lunch and 20 at supper

## 2013-11-23 ENCOUNTER — Ambulatory Visit: Payer: Medicare HMO | Admitting: Cardiology

## 2013-12-13 ENCOUNTER — Other Ambulatory Visit (INDEPENDENT_AMBULATORY_CARE_PROVIDER_SITE_OTHER): Payer: Commercial Managed Care - HMO

## 2013-12-13 DIAGNOSIS — IMO0001 Reserved for inherently not codable concepts without codable children: Secondary | ICD-10-CM

## 2013-12-13 DIAGNOSIS — E1165 Type 2 diabetes mellitus with hyperglycemia: Principal | ICD-10-CM

## 2013-12-13 LAB — BASIC METABOLIC PANEL
BUN: 10 mg/dL (ref 6–23)
CHLORIDE: 105 meq/L (ref 96–112)
CO2: 28 meq/L (ref 19–32)
Calcium: 8.8 mg/dL (ref 8.4–10.5)
Creatinine, Ser: 1.1 mg/dL (ref 0.4–1.5)
GFR: 89.56 mL/min (ref >60.00)
Glucose, Bld: 95 mg/dL (ref 70–99)
Potassium: 3.5 mEq/L (ref 3.5–5.1)
Sodium: 139 mEq/L (ref 135–145)

## 2013-12-13 LAB — HEMOGLOBIN A1C: Hgb A1c MFr Bld: 6.9 % — ABNORMAL HIGH (ref 4.6–6.5)

## 2013-12-16 ENCOUNTER — Ambulatory Visit: Payer: Medicare HMO | Admitting: Endocrinology

## 2013-12-20 ENCOUNTER — Ambulatory Visit: Payer: Medicare HMO | Admitting: Cardiology

## 2014-01-04 ENCOUNTER — Ambulatory Visit (INDEPENDENT_AMBULATORY_CARE_PROVIDER_SITE_OTHER): Payer: Medicare HMO | Admitting: Endocrinology

## 2014-01-04 ENCOUNTER — Encounter: Payer: Self-pay | Admitting: Endocrinology

## 2014-01-04 VITALS — BP 132/72 | HR 90 | Temp 98.3°F | Resp 16 | Ht 70.5 in | Wt 189.8 lb

## 2014-01-04 DIAGNOSIS — E1165 Type 2 diabetes mellitus with hyperglycemia: Principal | ICD-10-CM

## 2014-01-04 DIAGNOSIS — IMO0001 Reserved for inherently not codable concepts without codable children: Secondary | ICD-10-CM

## 2014-01-04 NOTE — Progress Notes (Signed)
Patient ID: [name], male   DOB: Feb 08, 1941, 73 y.o.   MRN: [id number]   Reason for Appointment: Diabetes follow-up   History of Present Illness   Diagnosis: Type 2 DIABETES MELITUS, diagnosis 2003?  PREVIOUS history: He had been initially treated with various oral hypoglycemic drugs including metformin. Because of failure of control he was started on insulin in about 2007 Despite taking basal bolus insulin his A1c had been persistently over 8% and was 9.6% when first seen in consultation With starting Victoza in 12/2011 his blood sugars had improved significantly and A1c had come down to as low as 7% but was back up to 8.4% and 3/14     RECENT history:  His blood sugars have been typically difficult to control in the past Previously had been recommended consultation with dietitian to help with control but he refused Also even though he was encouraged to start walking and exercising he was not doing this However since about a month ago when he was not able to get his Humalog because of the high cost he started walking and using exercise bike regularly. He is also modifying his diet and cutting back on portions His weight has come down significantly Also blood sugars are significantly better even without taking any mealtime insulin He continues to take basal insulin and Victoza along with metformin A1c in 8/15 was nearly normal Glucose patterns: Blood sugars are overall fairly consistent with only occasional high readings after her evening meal; however checking blood sugars mostly fasting and after supper He is tending to have low normal readings or occasional hypoglycemia between 3 PM-6 PM  Oral hypoglycemic drugs: 1 g metformin        Side effects from medications: None Insulin regimen: Levemir 38 units in am             Monitors blood glucose:  2.1 times a day.    Glucometer: One Touch.          Blood Glucose readings from meter download:  PREMEAL Breakfast Lunch  Dinner  8-9 PM  Overall  Glucose range:  78-135    58-94   75-216    Median:  86     120   92    Hypoglycemia: As above, recent documented low readings .          Meals: 3 meals per day.  For breakfast has eggs grits/toast, supper around 7 PM Physical activity: exercise: Ex bike 6/7           Dietician visit: 2013 with diabetes nurse practitioner  Wt Readings from Last 3 Encounters:  01/04/14 189 lb 12.8 oz (86.093 kg)  09/15/13 204 lb 6.4 oz (92.715 kg)  07/13/13 209 lb 3.2 oz (94.892 kg)   Lab Results  Component Value Date   HGBA1C 6.9* 12/13/2013   HGBA1C 8.1* 09/15/2013   HGBA1C 8.5* 05/06/2013   Lab Results  Component Value Date   MICROALBUR 7.0* 09/15/2013   LDLCALC 83 09/15/2013   CREATININE 1.1 12/13/2013      Medication List       This list is accurate as of: 01/04/14  4:24 PM.  Always use your most recent med list.               amLODipine-benazepril 10-20 MG per capsule  Commonly known as:  LOTREL  Take 1 capsule by mouth daily.     dorzolamide-timolol 22.3-6.8 MG/ML ophthalmic solution  Commonly known as:  COSOPT  ferrous fumarate 325 (106 FE) MG Tabs tablet  Commonly known as:  HEMOCYTE - 106 mg FE  Take 1 tablet by mouth 3 (three) times daily.     fish oil-omega-3 fatty acids 1000 MG capsule  Take 2 g by mouth daily.     glucose blood test strip  Commonly known as:  ONE TOUCH ULTRA TEST  Use as instructed to check blood sugars 3 times per day dx code 250.02     Insulin Detemir 100 UNIT/ML Pen  Commonly known as:  LEVEMIR FLEXTOUCH  Inject 38 units daily     insulin lispro 100 UNIT/ML injection  Commonly known as:  HUMALOG  Inject 12 units at breakfast, 15 at lunch and 20 at supper     Liraglutide 18 MG/3ML Sopn  Commonly known as:  VICTOZA  Inject 1.2 mg into the skin daily.     metFORMIN 1000 MG tablet  Commonly known as:  GLUCOPHAGE  Take 1/2 in am and 1 at dinner     metoprolol succinate 25 MG 24 hr tablet  Commonly known as:   TOPROL-XL  Take 25 mg by mouth daily.     metoprolol tartrate 25 MG tablet  Commonly known as:  LOPRESSOR     nitrofurantoin 100 MG capsule  Commonly known as:  MACRODANTIN  Take 100 mg by mouth 4 (four) times daily.     phenazopyridine 200 MG tablet  Commonly known as:  PYRIDIUM  Take 200 mg by mouth 3 (three) times daily as needed.     potassium chloride SA 20 MEQ tablet  Commonly known as:  K-DUR,KLOR-CON  Take 1 tablet twice a day for 7 days, then take 1 tablet daily     simvastatin 40 MG tablet  Commonly known as:  ZOCOR  Take 20 mg by mouth every evening.     tamsulosin 0.4 MG Caps capsule  Commonly known as:  FLOMAX  Take by mouth.     TRAVATAN Z 0.004 % Soln ophthalmic solution  Generic drug:  Travoprost (BAK Free)        Allergies:  Allergies  Allergen Reactions  . Aspirin   . Allopurinol Rash    Past Medical History  Diagnosis Date  . Hypertension   . Diabetes mellitus     insulin dependent  . Hypercholesterolemia   . Sarcoidosis   . Unspecified glaucoma   . Heart disease, unspecified   . Coronary artery disease     Past Surgical History  Procedure Laterality Date  . Knee replacement surgery      right  . Cataract extraction      Family History  Problem Relation Age of Onset  . Cancer Neg Hx   . Hypertension Mother   . Diabetes Mellitus I Mother   . Hypertension Father   . Coronary artery disease Father   . Hypertension Sister   . Hypertension Brother   . Coronary artery disease Brother   . Diabetes Mellitus I Brother   . Diabetes Mellitus I Brother     Social History:  reports that he quit smoking about 8 years ago. His smoking use included Cigarettes. He smoked 0.50 packs per day. He has never used smokeless tobacco. He reports that he does not drink alcohol or use illicit drugs.  Review of Systems:  HYPERTENSION:  appears fairly well controlled with Lotrel and metoprolol  HYPERLIPIDEMIA: The lipid abnormality consists of  elevated LDL treated with simvastatin.   Lab Results  Component Value Date  [place] 83 09/15/2013   History of BPH  No numbness or tingling in the feet Foot exam done in 3/15     Examination:   BP 132/72  Pulse 90  Temp(Src) 98.3 F (36.8 C)  Resp 16  Ht 5' 10.5" (1.791 m)  Wt 189 lb 12.8 oz (86.093 kg)  BMI 26.84 kg/m2  SpO2 96%  Body mass index is 26.84 kg/(m^2).     ASSESSMENT/ PLAN:   Diabetes type 2   The patient's diabetes control is much better with his starting an exercise program and also improving his diet He has lost significant amount of weight He is surprisingly doing well without any mealtime coverage with only rare postprandial hyperglycemia  Also he is tending to get hypoglycemic in the afternoons with current regimen of Levemir, metformin and Victoza  A1c was below 7% last month  Discussed adjusting insulin and avoiding hypoglycemia, he can try reducing Levemir by 4 units at least   [facility] 01/04/2014, 4:24 PM

## 2014-01-04 NOTE — Patient Instructions (Addendum)
Levemir 34 and if am sugar still < 90 in 1 week reduce to 30  Sugar should be > 80

## 2014-02-01 ENCOUNTER — Ambulatory Visit (INDEPENDENT_AMBULATORY_CARE_PROVIDER_SITE_OTHER): Payer: Medicare HMO | Admitting: Cardiology

## 2014-02-01 ENCOUNTER — Encounter: Payer: Self-pay | Admitting: Cardiology

## 2014-02-01 VITALS — BP 122/70 | HR 90 | Ht 70.0 in | Wt 188.0 lb

## 2014-02-01 DIAGNOSIS — I2510 Atherosclerotic heart disease of native coronary artery without angina pectoris: Secondary | ICD-10-CM

## 2014-02-01 DIAGNOSIS — E785 Hyperlipidemia, unspecified: Secondary | ICD-10-CM

## 2014-02-01 DIAGNOSIS — I10 Essential (primary) hypertension: Secondary | ICD-10-CM

## 2014-02-01 DIAGNOSIS — T783XXA Angioneurotic edema, initial encounter: Secondary | ICD-10-CM

## 2014-02-01 DIAGNOSIS — E1121 Type 2 diabetes mellitus with diabetic nephropathy: Secondary | ICD-10-CM

## 2014-02-01 MED ORDER — PREDNISONE 20 MG PO TABS: ORAL_TABLET | ORAL | Status: DC

## 2014-02-01 MED ORDER — DIPHENHYDRAMINE HCL 25 MG PO TABS: ORAL_TABLET | ORAL | Status: DC

## 2014-02-01 MED ORDER — FAMOTIDINE 20 MG PO TABS: 20.0000 mg | ORAL_TABLET | Freq: Every day | ORAL | Status: DC

## 2014-02-01 NOTE — Patient Instructions (Addendum)
Your physician has recommended you make the following change in your medication:  1. Stop Benazepril.   2. Start Benadryl 25 mg 1 tablet twice a day for 5 days.   3. Start Prednisone 20 mg 1 tablet daily for 5 days.  4. Start Pepcid 20 mg 1 tablet daily for 5 days.  Your physician has requested that you have an echocardiogram. Echocardiography is a painless test that uses sound waves to create images of your heart. It provides your doctor with information about the size and shape of your heart and how well your heart's chambers and valves are working. This procedure takes approximately one hour. There are no restrictions for this procedure.  Your physician wants you to follow-up in: 1 year with Dr. Marlou Porch. You will receive a reminder letter in the mail two months in advance. If you don't receive a letter, please call our office to schedule the follow-up appointment.

## 2014-02-01 NOTE — Progress Notes (Signed)
[facility]. [address] [place], [place]  [address] Phone: [phone] Fax:  [phone]  Date:  02/01/2014   ID:  [name], DOB [birth year], MRN [id number]  PCP:  Vidal Schwalbe, MD   History of Present Illness: [name] is a 73 y.o. male here for evaluation of coronary artery disease, patient of Dr. Melvern Banker. He has not seen a cardiologist in the past 5 years. He also has comorbidities of diabetes, hypertension, chronic kidney disease stage II, iron deficiency anemia, old myocardial infarction-CAD listed on medical records. He believes that he had a stress test previously several years ago. He does not recall having a cardiac catheterization, stent placement.  He does recall an incident that he had once when he was sitting at a table and became "blank". Did not fall over or lose consciousness. He has not had any episodes since.  Earlier today he began and noticed lip swelling. No trouble breathing, no tongue swelling, no insect bites. He has not had any previous episodes of angioedema.   Wt Readings from Last 3 Encounters:  02/01/14 188 lb (85.276 kg)  01/04/14 189 lb 12.8 oz (86.093 kg)  09/15/13 204 lb 6.4 oz (92.715 kg)     Past Medical History  Diagnosis Date  . Hypertension   . Diabetes mellitus     insulin dependent  . Hypercholesterolemia   . Sarcoidosis   . Unspecified glaucoma   . Heart disease, unspecified   . Coronary artery disease     Past Surgical History  Procedure Laterality Date  . Knee replacement surgery      right  . Cataract extraction      Current Outpatient Prescriptions  Medication Sig Dispense Refill  . amLODipine (NORVASC) 10 MG tablet Take 10 mg by mouth daily.      . benazepril (LOTENSIN) 20 MG tablet Take 20 mg by mouth daily.      . dorzolamide-timolol (COSOPT) 22.3-6.8 MG/ML ophthalmic solution       . ferrous fumarate (HEMOCYTE - 106 MG FE) 325 (106 FE) MG TABS Take 1 tablet by mouth 3 (three) times daily.      .  fish oil-omega-3 fatty acids 1000 MG capsule Take 2 g by mouth daily.        Marland Kitchen glucose blood (ONE TOUCH ULTRA TEST) test strip Use as instructed to check blood sugars 3 times per day dx code 250.02  300 each  1  . Insulin Detemir (LEVEMIR FLEXTOUCH) 100 UNIT/ML Pen Inject 38 units daily  20 pen  1  . Liraglutide (VICTOZA) 18 MG/3ML SOPN Inject 1.2 mg into the skin daily.  15 pen  1  . metFORMIN (GLUCOPHAGE) 1000 MG tablet Take 1/2 in am and 1 at dinner  135 tablet  1  . metoprolol succinate (TOPROL-XL) 25 MG 24 hr tablet Take 25 mg by mouth daily.        . phenazopyridine (PYRIDIUM) 200 MG tablet Take 200 mg by mouth 3 (three) times daily as needed.      . potassium chloride SA (K-DUR,KLOR-CON) 20 MEQ tablet Take 1 tablet twice a day for 7 days, then take 1 tablet daily  35 tablet  3  . simvastatin (ZOCOR) 20 MG tablet Take 20 mg by mouth daily.      . Tamsulosin HCl (FLOMAX) 0.4 MG CAPS Take by mouth.        . TRAVATAN Z 0.004 % SOLN ophthalmic solution  No current facility-administered medications for this visit.    Allergies:    Allergies  Allergen Reactions  . Allopurinol Rash  . Aspirin     Unknown reaction     Social History:  The patient  reports that he quit smoking about 8 years ago. His smoking use included Cigarettes. He smoked 0.50 packs per day. He has never used smokeless tobacco. He reports that he does not drink alcohol or use illicit drugs.   Family History  Problem Relation Age of Onset  . Cancer Neg Hx   . Hypertension Mother   . Diabetes Mellitus I Mother   . Hypertension Father   . Coronary artery disease Father   . Hypertension Sister   . Hypertension Brother   . Coronary artery disease Brother   . Diabetes Mellitus I Brother   . Diabetes Mellitus I Brother    brother died with coronary artery disease, lung cancer. He has 7 total siblings.  ROS:  Please see the history of present illness.   Denies any fevers, chills, orthopnea, PND, rash, headaches,  stroke like symptoms, bleeding.   All other systems reviewed and negative.   PHYSICAL EXAM: VS:  BP 122/70  Pulse 90  Ht 5\' 10"  (1.778 m)  Wt 188 lb (85.276 kg)  BMI 26.98 kg/m2 Well nourished, well developed, in no acute distress HEENT: Significantly increased upper and lower lip swelling, [place]/AT, EOMI Neck: no JVD, normal carotid upstroke, no bruit Cardiac:  normal S1, S2; RRR; no murmur Lungs:  clear to auscultation bilaterally, no wheezing, rhonchi or rales Abd: soft, nontender, no hepatomegaly, no bruitsMildly protuberant Ext: no edema, 2+ distal pulses Skin: warm and dry GU: deferred Neuro: no focal abnormalities noted, AAO x 3  EKG:  Sinus rhythm, 90, nonspecific ST-T wave changes, LVH borderline Labs: Glucose 113, creatinine 1.08, potassium 3.9, sodium 140, hemoglobin 14.2, platelets 174, uric acid 9.1 mildly elevated, hemoglobin A1c 6.9, LDL 83   ASSESSMENT AND PLAN:  1. Angioedema-noticeable lip swelling noted. Physical exam does not note any tongue swelling or any airway impairment. No wheezing. He feels fine. I have prescribed him prednisone 20 mg once a day for 5 days, Benadryl 25 mg twice a day for 5 days, Pepcid 20 mg once a day for 5 days. Combination of steroids and H1, H2 blockade. I have also discontinued his benazepril, ACE inhibitor. He should not be prescribed an ACE inhibitor in the future nor should he be prescribed angiotensin receptor blocker. Earlier today he did discuss his lip swelling with Dr. Dema Severin regarding placed him on Benadryl therapy. If symptoms worsen, he noticed that in the emergency room. 2. Coronary artery disease-has a history of this is been carried on his past medical history. He he believes that he is had a stress test in the past but does not recall having a cardiac catheterization. I will check an echocardiogram to ensure proper structure and function of his heart. It is listed that he is had a previous myocardial infarction. I wonder if there is  a wall motion abnormality. I looked through old [facility] records and do not see a Dr. Melvern Banker visit. 3. Hypertension-currently well controlled. Now that he is off of benazepril 20 mg, another agent may need to be substituted. 4. Hyperlipidemia-continue with statin therapy. LDL 83. Ultimate goal 70. 5. Diabetes with chronic kidney disease stage II-overall hemoglobin A1c 6.9. Well controlled. Dr. Dema Severin. 6. We will see him back in one year or sooner if he begins to have  symptoms from a cardiac perspective.  Signed, Candee Furbish, MD [facility]  02/01/2014 3:38 PM

## 2014-02-03 ENCOUNTER — Other Ambulatory Visit (HOSPITAL_COMMUNITY): Payer: Self-pay | Admitting: Cardiology

## 2014-02-03 DIAGNOSIS — I2510 Atherosclerotic heart disease of native coronary artery without angina pectoris: Secondary | ICD-10-CM

## 2014-02-06 ENCOUNTER — Ambulatory Visit (HOSPITAL_COMMUNITY): Payer: Medicare HMO | Attending: Cardiology | Admitting: Radiology

## 2014-02-06 DIAGNOSIS — I10 Essential (primary) hypertension: Secondary | ICD-10-CM | POA: Diagnosis not present

## 2014-02-06 DIAGNOSIS — I2510 Atherosclerotic heart disease of native coronary artery without angina pectoris: Secondary | ICD-10-CM

## 2014-02-06 DIAGNOSIS — E119 Type 2 diabetes mellitus without complications: Secondary | ICD-10-CM | POA: Diagnosis not present

## 2014-02-06 DIAGNOSIS — E785 Hyperlipidemia, unspecified: Secondary | ICD-10-CM | POA: Insufficient documentation

## 2014-02-06 NOTE — Progress Notes (Signed)
Echocardiogram performed.  

## 2014-02-07 ENCOUNTER — Other Ambulatory Visit: Payer: Self-pay | Admitting: Endocrinology

## 2014-02-09 ENCOUNTER — Ambulatory Visit: Payer: Medicare HMO | Admitting: Cardiology

## 2014-02-13 ENCOUNTER — Other Ambulatory Visit: Payer: Self-pay | Admitting: Endocrinology

## 2014-03-31 ENCOUNTER — Other Ambulatory Visit (INDEPENDENT_AMBULATORY_CARE_PROVIDER_SITE_OTHER): Payer: Commercial Managed Care - HMO

## 2014-03-31 DIAGNOSIS — IMO0002 Reserved for concepts with insufficient information to code with codable children: Secondary | ICD-10-CM

## 2014-03-31 DIAGNOSIS — E1165 Type 2 diabetes mellitus with hyperglycemia: Secondary | ICD-10-CM

## 2014-03-31 LAB — COMPREHENSIVE METABOLIC PANEL
ALK PHOS: 58 U/L (ref 39–117)
ALT: 17 U/L (ref 0–53)
AST: 21 U/L (ref 0–37)
Albumin: 3.4 g/dL — ABNORMAL LOW (ref 3.5–5.2)
BILIRUBIN TOTAL: 0.7 mg/dL (ref 0.2–1.2)
BUN: 9 mg/dL (ref 6–23)
CO2: 24 mEq/L (ref 19–32)
Calcium: 9.1 mg/dL (ref 8.4–10.5)
Chloride: 104 mEq/L (ref 96–112)
Creatinine, Ser: 1 mg/dL (ref 0.4–1.5)
GFR: 95.77 mL/min (ref >60.00)
Glucose, Bld: 166 mg/dL — ABNORMAL HIGH (ref 70–99)
Potassium: 3 mEq/L — ABNORMAL LOW (ref 3.5–5.1)
SODIUM: 137 meq/L (ref 135–145)
TOTAL PROTEIN: 7.2 g/dL (ref 6.0–8.3)

## 2014-03-31 LAB — HEMOGLOBIN A1C: Hgb A1c MFr Bld: 7.1 % — ABNORMAL HIGH (ref 4.6–6.5)

## 2014-04-05 ENCOUNTER — Ambulatory Visit (INDEPENDENT_AMBULATORY_CARE_PROVIDER_SITE_OTHER): Payer: Commercial Managed Care - HMO | Admitting: Endocrinology

## 2014-04-05 ENCOUNTER — Encounter: Payer: Self-pay | Admitting: Endocrinology

## 2014-04-05 VITALS — BP 146/74 | HR 99 | Temp 98.2°F | Resp 16 | Ht 70.0 in | Wt 192.0 lb

## 2014-04-05 DIAGNOSIS — E876 Hypokalemia: Secondary | ICD-10-CM

## 2014-04-05 DIAGNOSIS — E7800 Pure hypercholesterolemia, unspecified: Secondary | ICD-10-CM

## 2014-04-05 DIAGNOSIS — IMO0002 Reserved for concepts with insufficient information to code with codable children: Secondary | ICD-10-CM

## 2014-04-05 DIAGNOSIS — E1165 Type 2 diabetes mellitus with hyperglycemia: Secondary | ICD-10-CM

## 2014-04-05 MED ORDER — POTASSIUM CHLORIDE CRYS ER 20 MEQ PO TBCR: EXTENDED_RELEASE_TABLET | ORAL | Status: DC

## 2014-04-05 NOTE — Patient Instructions (Addendum)
Reduce Levemir 36 units  Call if sugar goes over 180, 2 hours after meals  Exercise at supper time daily  Potassium for 30 days

## 2014-04-05 NOTE — Progress Notes (Signed)
Patient ID: [name], male   DOB: August 06, 1940, 73 y.o.   MRN: [id number]   Reason for Appointment: Diabetes follow-up   History of Present Illness   Diagnosis: Type 2 DIABETES MELITUS, diagnosis 2003?  PREVIOUS history: He had been initially treated with various oral hypoglycemic drugs including metformin. Because of failure of control he was started on insulin in about 2007 Despite taking basal bolus insulin his A1c had been persistently over 8% and was 9.6% when first seen in consultation With starting Victoza in 12/2011 his blood sugars had improved significantly and A1c had come down to as low as 7% but was back up to 8.4% and 3/14    His blood sugars have been typically difficult to control in the past  RECENT history:  Previously had  Persistent poor control but since about 8/15 with starting a regular exercise program his overall control had improved    Also he stopped taking Humalog  On his own because of the high cost ; however  Did not seem to have high readings after meals consistently.  He did however continue taking his basal insulin and Victoza along with metformin   He has somewhat higher readings now but difficult to assess because he has incorrect date and time on his monitor.  Highest reading is 314 on his monitor but he does not think this is a recent reading ; readings sporadically higher according to his monitor late in the evening and occasionally in the afternoon   He thinks some of his high reading may be related to his not being consistent with his exercise recently and also eating out periodically with traveling Fasting blood sugars are however fairly good with a couple of readings in the low normal range  does not report any hypoglycemic symptoms now  A1c has gone up slightly   Oral hypoglycemic drugs: 1 g metformin        Side effects from medications: None Insulin regimen: Levemir 38 units in am             Monitors blood glucose:  2.1 times a day.     Glucometer: One Touch.          Blood Glucose readings from meter download:  PRE-MEAL Breakfast Lunch Dinner Bedtime Overall  Glucose range:  68-141  127  84-314  93-228    median:  103    124   Hypoglycemia: none .          Meals: 3 meals per day.  For breakfast has eggs grits/toast, supper around 7 PM Physical activity: exercise: Exercise bike , recently irregular          Dietician visit: 2013 with diabetes nurse practitioner  Wt Readings from Last 3 Encounters:  04/05/14 192 lb (87.091 kg)  02/01/14 188 lb (85.276 kg)  01/04/14 189 lb 12.8 oz (86.093 kg)   Lab Results  Component Value Date   HGBA1C 7.1* 03/31/2014   HGBA1C 6.9* 12/13/2013   HGBA1C 8.1* 09/15/2013   Lab Results  Component Value Date   MICROALBUR 7.0* 09/15/2013   LDLCALC 83 09/15/2013   CREATININE 1.0 03/31/2014      Medication List       This list is accurate as of: 04/05/14 10:27 AM.  Always use your most recent med list.               amLODipine 10 MG tablet  Commonly known as:  NORVASC  Take 10 mg by mouth daily.  diphenhydrAMINE 25 MG tablet  Commonly known as:  BENADRYL  1 tablet twice a day for 5 days     dorzolamide-timolol 22.3-6.8 MG/ML ophthalmic solution  Commonly known as:  COSOPT     famotidine 20 MG tablet  Commonly known as:  PEPCID  Take 1 tablet (20 mg total) by mouth daily.     ferrous fumarate 325 (106 FE) MG Tabs tablet  Commonly known as:  HEMOCYTE - 106 mg FE  Take 1 tablet by mouth 3 (three) times daily.     fish oil-omega-3 fatty acids 1000 MG capsule  Take 2 g by mouth daily.     Insulin Detemir 100 UNIT/ML Pen  Commonly known as:  LEVEMIR FLEXTOUCH  Inject 38 units daily     latanoprost 0.005 % ophthalmic solution  Commonly known as:  XALATAN     Liraglutide 18 MG/3ML Sopn  Commonly known as:  VICTOZA  Inject 1.2 mg into the skin daily.     metFORMIN 1000 MG tablet  Commonly known as:  GLUCOPHAGE  TAKE 1/2 TABLET EACH MORNING AND 1 AT  DINNER     metoprolol succinate 25 MG 24 hr tablet  Commonly known as:  TOPROL-XL  Take 25 mg by mouth daily.     metoprolol tartrate 25 MG tablet  Commonly known as:  LOPRESSOR     ONE TOUCH ULTRA TEST test strip  Generic drug:  glucose blood  USE AS INSTRUCTED TO CHECK BLOOD SUGARS THREE TIMES DAILY     phenazopyridine 200 MG tablet  Commonly known as:  PYRIDIUM  Take 200 mg by mouth 3 (three) times daily as needed.     potassium chloride SA 20 MEQ tablet  Commonly known as:  K-DUR,KLOR-CON  Take 1 tablet twice a day for 7 days, then take 1 tablet daily     predniSONE 20 MG tablet  Commonly known as:  DELTASONE  1 tablet daily with breakfast for 5 days     simvastatin 20 MG tablet  Commonly known as:  ZOCOR  Take 20 mg by mouth daily.     tamsulosin 0.4 MG Caps capsule  Commonly known as:  FLOMAX  Take by mouth.     TRAVATAN Z 0.004 % Soln ophthalmic solution  Generic drug:  Travoprost (BAK Free)     ULORIC 40 MG tablet  Generic drug:  febuxostat        Allergies:  Allergies  Allergen Reactions  . Ace Inhibitors Other (See Comments)    angioedema  . Allopurinol Rash  . Aspirin     Unknown reaction     Past Medical History  Diagnosis Date  . Hypertension   . Diabetes mellitus     insulin dependent  . Hypercholesterolemia   . Sarcoidosis   . Unspecified glaucoma   . Heart disease, unspecified   . Coronary artery disease     Past Surgical History  Procedure Laterality Date  . Knee replacement surgery      right  . Cataract extraction      Family History  Problem Relation Age of Onset  . Cancer Neg Hx   . Hypertension Mother   . Diabetes Mellitus I Mother   . Hypertension Father   . Coronary artery disease Father   . Hypertension Sister   . Hypertension Brother   . Coronary artery disease Brother   . Diabetes Mellitus I Brother   . Diabetes Mellitus I Brother     Social History:  reports  that he quit smoking about 8 years ago. His  smoking use included Cigarettes. He smoked 0.50 packs per day. He has never used smokeless tobacco. He reports that he does not drink alcohol or use illicit drugs.  Review of Systems:  HYPERTENSION:   Usually fairly well controlled with Lotrel and metoprolol , blood pressure is high normal today but he has not taken his medication.  Also followed by PCP recently   HYPOKALEMIA:  He does not appear to be taking any diuretics and not clear why his potassium is low. Does not complain of muscle cramps   Has had previous prescription for potassium also and has not taken any supplements recently; was not recommended anything by PCP recently  HYPERLIPIDEMIA: The lipid abnormality consists of elevated LDL treated with simvastatin.  Also has low HDL   Lab Results  Component Value Date   CHOL 130 09/15/2013   HDL 32.00* 09/15/2013   LDLCALC 83 09/15/2013   TRIG 73.0 09/15/2013   CHOLHDL 4 09/15/2013    No numbness or tingling in the feet Foot exam done in 3/15     Examination:   BP 146/74 mmHg  Pulse 99  Temp(Src) 98.2 F (36.8 C)  Resp 16  Ht 5\' 10"  (1.778 m)  Wt 192 lb (87.091 kg)  BMI 27.55 kg/m2  SpO2 93%  Body mass index is 27.55 kg/(m^2).    No ankle edema present  ASSESSMENT/ PLAN:   Diabetes type 2   The patient's diabetes control is  Somewhat variable now with his being irregular with his diet and exercise regimen   Although he has some readings over 200  He thinks this is  Because of his meter being old and in accurate and also difficult to analyze his download because of incorrect  date/Time programmed  A1c is slightly higher and he has gained back a little weight that he had lost  HYPOKALEMIA: Potassium 3.0 and will need supplementation  Hypertension: Blood pressure is high normal, currently being managed by PCP   Plan: Since his fasting readings are low normal will reduce his Levemir by another 2 units Discussed needing Regular Insulin at least at suppertime if  postprandial readings are high, discussed blood sugar targets  Resume exercise regularly and try to do this before supper time Consistent diet  Take potassium at least for one month and follow-up with PCP for continued hypokalemia Consider checking magnesium on the next visit  Patient Instructions  Reduce Levemir 36 units  Call if sugar goes over 180, 2 hours after meals  Exercise at supper time daily  Potassium for 30 days   Counseling time over 50% of today's 25 minute visit   Sherleen Pangborn 04/05/2014, 10:27 AM

## 2014-04-18 ENCOUNTER — Telehealth: Payer: Self-pay | Admitting: Endocrinology

## 2014-04-18 ENCOUNTER — Other Ambulatory Visit: Payer: Self-pay | Admitting: *Deleted

## 2014-04-18 MED ORDER — METFORMIN HCL 1000 MG PO TABS: ORAL_TABLET | ORAL | Status: DC

## 2014-04-18 NOTE — Telephone Encounter (Signed)
Patients wife called stating that his Metformin 500 mg was never called in [place]  Thank you

## 2014-04-24 DIAGNOSIS — N529 Male erectile dysfunction, unspecified: Secondary | ICD-10-CM | POA: Diagnosis not present

## 2014-04-24 DIAGNOSIS — D090 Carcinoma in situ of bladder: Secondary | ICD-10-CM | POA: Diagnosis not present

## 2014-04-24 DIAGNOSIS — C61 Malignant neoplasm of prostate: Secondary | ICD-10-CM | POA: Diagnosis not present

## 2014-05-17 DIAGNOSIS — H4011X2 Primary open-angle glaucoma, moderate stage: Secondary | ICD-10-CM | POA: Diagnosis not present

## 2014-05-17 DIAGNOSIS — Z961 Presence of intraocular lens: Secondary | ICD-10-CM | POA: Diagnosis not present

## 2014-05-19 DIAGNOSIS — H4011X2 Primary open-angle glaucoma, moderate stage: Secondary | ICD-10-CM | POA: Diagnosis not present

## 2014-05-22 DIAGNOSIS — H4011X2 Primary open-angle glaucoma, moderate stage: Secondary | ICD-10-CM | POA: Diagnosis not present

## 2014-06-14 DIAGNOSIS — H4011X1 Primary open-angle glaucoma, mild stage: Secondary | ICD-10-CM | POA: Diagnosis not present

## 2014-06-14 DIAGNOSIS — Z961 Presence of intraocular lens: Secondary | ICD-10-CM | POA: Diagnosis not present

## 2014-06-14 DIAGNOSIS — H4011X2 Primary open-angle glaucoma, moderate stage: Secondary | ICD-10-CM | POA: Diagnosis not present

## 2014-07-03 ENCOUNTER — Other Ambulatory Visit (INDEPENDENT_AMBULATORY_CARE_PROVIDER_SITE_OTHER): Payer: Commercial Managed Care - HMO

## 2014-07-03 DIAGNOSIS — E1165 Type 2 diabetes mellitus with hyperglycemia: Secondary | ICD-10-CM | POA: Diagnosis not present

## 2014-07-03 DIAGNOSIS — IMO0002 Reserved for concepts with insufficient information to code with codable children: Secondary | ICD-10-CM

## 2014-07-03 DIAGNOSIS — E7800 Pure hypercholesterolemia, unspecified: Secondary | ICD-10-CM

## 2014-07-03 LAB — LIPID PANEL
Cholesterol: 144 mg/dL (ref 0–200)
HDL: 33 mg/dL — ABNORMAL LOW (ref >39.00)
LDL CALC: 94 mg/dL (ref 0–99)
NONHDL: 111
Total CHOL/HDL Ratio: 4
Triglycerides: 84 mg/dL (ref 0.0–149.0)
VLDL: 16.8 mg/dL (ref 0.0–40.0)

## 2014-07-03 LAB — COMPREHENSIVE METABOLIC PANEL
ALBUMIN: 3.9 g/dL (ref 3.5–5.2)
ALT: 12 U/L (ref 0–53)
AST: 15 U/L (ref 0–37)
Alkaline Phosphatase: 64 U/L (ref 39–117)
BUN: 9 mg/dL (ref 6–23)
CO2: 30 mEq/L (ref 19–32)
CREATININE: 1.09 mg/dL (ref 0.40–1.50)
Calcium: 9.5 mg/dL (ref 8.4–10.5)
Chloride: 102 mEq/L (ref 96–112)
GFR: 85.64 mL/min (ref >60.00)
Glucose, Bld: 127 mg/dL — ABNORMAL HIGH (ref 70–99)
Potassium: 3.4 mEq/L — ABNORMAL LOW (ref 3.5–5.1)
Sodium: 139 mEq/L (ref 135–145)
Total Bilirubin: 0.6 mg/dL (ref 0.2–1.2)
Total Protein: 7.9 g/dL (ref 6.0–8.3)

## 2014-07-03 LAB — HEMOGLOBIN A1C: Hgb A1c MFr Bld: 8.6 % — ABNORMAL HIGH (ref 4.6–6.5)

## 2014-07-03 LAB — URINALYSIS, ROUTINE W REFLEX MICROSCOPIC
Bilirubin Urine: NEGATIVE
KETONES UR: NEGATIVE
Leukocytes, UA: NEGATIVE
Nitrite: NEGATIVE
RBC / HPF: NONE SEEN (ref 0–?)
Specific Gravity, Urine: 1.02 (ref 1.000–1.030)
Urine Glucose: NEGATIVE
Urobilinogen, UA: 0.2 (ref 0.0–1.0)
pH: 6 (ref 5.0–8.0)

## 2014-07-03 LAB — MICROALBUMIN / CREATININE URINE RATIO
Creatinine,U: 215.8 mg/dL
MICROALB UR: 6.9 mg/dL — AB (ref 0.0–1.9)
Microalb Creat Ratio: 3.2 mg/g (ref 0.0–30.0)

## 2014-07-06 ENCOUNTER — Encounter: Payer: Self-pay | Admitting: Endocrinology

## 2014-07-06 ENCOUNTER — Ambulatory Visit (INDEPENDENT_AMBULATORY_CARE_PROVIDER_SITE_OTHER): Payer: Commercial Managed Care - HMO | Admitting: Endocrinology

## 2014-07-06 VITALS — BP 130/72 | HR 85 | Temp 97.9°F | Ht 70.0 in | Wt 188.0 lb

## 2014-07-06 DIAGNOSIS — E1165 Type 2 diabetes mellitus with hyperglycemia: Secondary | ICD-10-CM

## 2014-07-06 DIAGNOSIS — IMO0002 Reserved for concepts with insufficient information to code with codable children: Secondary | ICD-10-CM

## 2014-07-06 DIAGNOSIS — E876 Hypokalemia: Secondary | ICD-10-CM | POA: Diagnosis not present

## 2014-07-06 DIAGNOSIS — E782 Mixed hyperlipidemia: Secondary | ICD-10-CM | POA: Diagnosis not present

## 2014-07-06 MED ORDER — INSULIN NPH (HUMAN) (ISOPHANE) 100 UNIT/ML ~~LOC~~ SUSP: SUBCUTANEOUS | Status: DC

## 2014-07-06 NOTE — Progress Notes (Signed)
Patient ID: [name], male   DOB: 06-20-1940, 74 y.o.   MRN: [id number]   Reason for Appointment: Diabetes follow-up   History of Present Illness   Diagnosis: Type 2 DIABETES MELITUS, diagnosis 2003?  PREVIOUS history: He had been initially treated with various oral hypoglycemic drugs including metformin. Because of failure of control he was started on insulin in about 2007 Despite taking basal bolus insulin his A1c had been persistently over 8% and was 9.6% when first seen in consultation With starting Victoza in 12/2011 his blood sugars had improved significantly and A1c had come down to as low as 7% but was back up to 8.4% and 3/14    His blood sugars have been typically difficult to control in the past In 8/15 with starting a regular exercise program his overall control had improved   RECENT history:   More recently has difficulty affording his insulin and medications and his level of control is inconsistent Also he is a poor historian and not clear what regimen he is following and when he is not taking his insulin because of lack of supplies He said that he was not able to get his Humalog about a month ago and also has had some irregularity with his supply of Levemir He was told to check in the cost of the regular insulin at [facility] and he did not do so A1c has gone up   Current blood sugar patterns and problems identified   His fasting blood sugars are fairly good and he is taking his Levemir in the mornings  Blood sugars are periodically higher in the afternoons and evenings even though he thinks he is taking Humalog 18 units with some of his evening meals and not clear when he does this.  Does not usually take Humalog for breakfast and lunch and usually does not check his sugars around lunchtime  Oral hypoglycemic drugs: 1 g metformin        Side effects from medications: None Insulin regimen: Levemir 38 units in am, Humalog 18 units irregularly at suppertime       Monitors blood glucose:  2.7 times a day.    Glucometer: One Touch.          Blood Glucose readings from meter download:  PRE-MEAL Breakfast Lunch Dinner  PCS  Overall  Glucose range:  85-201    70-245   117-327    Median:     220   133     Hypoglycemia: none .          Meals: 3 meals per day.  For breakfast has eggs grits/toast, supper around 7 PM Physical activity: exercise: Exercise bike qod         Dietician visit: 2013 with diabetes nurse practitioner  Wt Readings from Last 3 Encounters:  07/06/14 188 lb (85.276 kg)  04/05/14 192 lb (87.091 kg)  02/01/14 188 lb (85.276 kg)   Lab Results  Component Value Date   HGBA1C 8.6* 07/03/2014   HGBA1C 7.1* 03/31/2014   HGBA1C 6.9* 12/13/2013   Lab Results  Component Value Date   MICROALBUR 6.9* 07/03/2014   LDLCALC 94 07/03/2014   CREATININE 1.09 07/03/2014   Lab on 07/03/2014  Component Date Value Ref Range Status  . Sodium 07/03/2014 139  135 - 145 mEq/L Final  . Potassium 07/03/2014 3.4* 3.5 - 5.1 mEq/L Final  . Chloride 07/03/2014 102  96 - 112 mEq/L Final  . CO2 07/03/2014 30  19 - 32 mEq/L Final  .  Glucose, Bld 07/03/2014 127* 70 - 99 mg/dL Final  . BUN 07/03/2014 9  6 - 23 mg/dL Final  . Creatinine, Ser 07/03/2014 1.09  0.40 - 1.50 mg/dL Final  . Total Bilirubin 07/03/2014 0.6  0.2 - 1.2 mg/dL Final  . Alkaline Phosphatase 07/03/2014 64  39 - 117 U/L Final  . AST 07/03/2014 15  0 - 37 U/L Final  . ALT 07/03/2014 12  0 - 53 U/L Final  . Total Protein 07/03/2014 7.9  6.0 - 8.3 g/dL Final  . Albumin 07/03/2014 3.9  3.5 - 5.2 g/dL Final  . Calcium 07/03/2014 9.5  8.4 - 10.5 mg/dL Final  . GFR 07/03/2014 85.64  >60.00 mL/min Final  . Hgb A1c MFr Bld 07/03/2014 8.6* 4.6 - 6.5 % Final   Glycemic Control Guidelines for People with Diabetes:Non Diabetic:  <6%Goal of Therapy: <7%Additional Action Suggested:  >8%   . Cholesterol 07/03/2014 144  0 - 200 mg/dL Final   ATP III Classification       Desirable:  < 200 mg/dL                Borderline High:  200 - 239 mg/dL          High:  > = 240 mg/dL  . Triglycerides 07/03/2014 84.0  0.0 - 149.0 mg/dL Final   Normal:  <150 mg/dLBorderline High:  150 - 199 mg/dL  . HDL 07/03/2014 33.00* >39.00 mg/dL Final  . VLDL 07/03/2014 16.8  0.0 - 40.0 mg/dL Final  . LDL Cholesterol 07/03/2014 94  0 - 99 mg/dL Final  . Total CHOL/HDL Ratio 07/03/2014 4   Final                  Men          Women1/2 Average Risk     3.4          3.3Average Risk          5.0          4.42X Average Risk          9.6          7.13X Average Risk          15.0          11.0                      . NonHDL 07/03/2014 111.00   Final   NOTE:  Non-HDL goal should be 30 mg/dL higher than patient's LDL goal (i.e. LDL goal of < 70 mg/dL, would have non-HDL goal of < 100 mg/dL)  . Color, Urine 07/03/2014 YELLOW  Yellow;Lt. Yellow Final  . APPearance 07/03/2014 CLEAR  Clear Final  . Specific Gravity, Urine 07/03/2014 1.020  1.000-1.030 Final  . pH 07/03/2014 6.0  5.0 - 8.0 Final  . Total Protein, Urine 07/03/2014 TRACE* Negative Final  . Urine Glucose 07/03/2014 NEGATIVE  Negative Final  . Ketones, ur 07/03/2014 NEGATIVE  Negative Final  . Bilirubin Urine 07/03/2014 NEGATIVE  Negative Final  . Hgb urine dipstick 07/03/2014 TRACE-INTACT* Negative Final  . Urobilinogen, UA 07/03/2014 0.2  0.0 - 1.0 Final  . Leukocytes, UA 07/03/2014 NEGATIVE  Negative Final  . Nitrite 07/03/2014 NEGATIVE  Negative Final  . WBC, UA 07/03/2014 0-2/hpf  0-2/hpf Final  . RBC / HPF 07/03/2014 none seen  0-2/hpf Final  . Mucus, UA 07/03/2014 Presence of* None Final  . Squamous Epithelial / LPF 07/03/2014 Rare(0-4/hpf)  Rare(0-4/hpf)  Final  . Microalb, Ur 07/03/2014 6.9* 0.0 - 1.9 mg/dL Final  . Creatinine,U 07/03/2014 215.8   Final  . Microalb Creat Ratio 07/03/2014 3.2  0.0 - 30.0 mg/g Final       Medication List       This list is accurate as of: 07/06/14 11:59 PM.  Always use your most recent med list.                amLODipine 10 MG tablet  Commonly known as:  NORVASC  Take 10 mg by mouth daily.     diphenhydrAMINE 25 MG tablet  Commonly known as:  BENADRYL  1 tablet twice a day for 5 days     dorzolamide-timolol 22.3-6.8 MG/ML ophthalmic solution  Commonly known as:  COSOPT     famotidine 20 MG tablet  Commonly known as:  PEPCID  Take 1 tablet (20 mg total) by mouth daily.     ferrous fumarate 325 (106 FE) MG Tabs tablet  Commonly known as:  HEMOCYTE - 106 mg FE  Take 1 tablet by mouth 3 (three) times daily.     fish oil-omega-3 fatty acids 1000 MG capsule  Take 2 g by mouth daily.     Insulin Detemir 100 UNIT/ML Pen  Commonly known as:  LEVEMIR FLEXTOUCH  Inject 38 units daily     insulin NPH Human 100 UNIT/ML injection  Commonly known as:  NOVOLIN N RELION  12- 18 units before supper     latanoprost 0.005 % ophthalmic solution  Commonly known as:  XALATAN     Liraglutide 18 MG/3ML Sopn  Commonly known as:  VICTOZA  Inject 1.2 mg into the skin daily.     metFORMIN 1000 MG tablet  Commonly known as:  GLUCOPHAGE  TAKE 1/2 TABLET EACH MORNING AND 1 AT DINNER     metoprolol succinate 25 MG 24 hr tablet  Commonly known as:  TOPROL-XL  Take 25 mg by mouth daily.     metoprolol tartrate 25 MG tablet  Commonly known as:  LOPRESSOR     ONE TOUCH ULTRA TEST test strip  Generic drug:  glucose blood  USE AS INSTRUCTED TO CHECK BLOOD SUGARS THREE TIMES DAILY     phenazopyridine 200 MG tablet  Commonly known as:  PYRIDIUM  Take 200 mg by mouth 3 (three) times daily as needed.     potassium chloride SA 20 MEQ tablet  Commonly known as:  K-DUR,KLOR-CON  take 1 tablet daily     simvastatin 20 MG tablet  Commonly known as:  ZOCOR  Take 20 mg by mouth daily.     tamsulosin 0.4 MG Caps capsule  Commonly known as:  FLOMAX  Take by mouth.     TRAVATAN Z 0.004 % Soln ophthalmic solution  Generic drug:  Travoprost (BAK Free)     ULORIC 40 MG tablet  Generic drug:  febuxostat          Allergies:  Allergies  Allergen Reactions  . Ace Inhibitors Other (See Comments)    angioedema  . Allopurinol Rash  . Aspirin     Unknown reaction     Past Medical History  Diagnosis Date  . Hypertension   . Diabetes mellitus     insulin dependent  . Hypercholesterolemia   . Sarcoidosis   . Unspecified glaucoma   . Heart disease, unspecified   . Coronary artery disease     Past Surgical History  Procedure Laterality Date  . Knee replacement  surgery      right  . Cataract extraction      Family History  Problem Relation Age of Onset  . Cancer Neg Hx   . Hypertension Mother   . Diabetes Mellitus I Mother   . Hypertension Father   . Coronary artery disease Father   . Hypertension Sister   . Hypertension Brother   . Coronary artery disease Brother   . Diabetes Mellitus I Brother   . Diabetes Mellitus I Brother     Social History:  reports that he quit smoking about 9 years ago. His smoking use included Cigarettes. He smoked 0.50 packs per day. He has never used smokeless tobacco. He reports that he does not drink alcohol or use illicit drugs.  Review of Systems:  HYPERTENSION:   Usually fairly well controlled with Lotrel and metoprolol , blood pressure is high normal today but he has not taken his medication.  Also followed by PCP recently   HYPOKALEMIA:  He does not appear to be taking any diuretics and not clear why his potassium is again low.  HYPERLIPIDEMIA: The lipid abnormality consists of elevated LDL treated with simvastatin.  Also has low HDL   Lab Results  Component Value Date   CHOL 144 07/03/2014   HDL 33.00* 07/03/2014   LDLCALC 94 07/03/2014   TRIG 84.0 07/03/2014   CHOLHDL 4 07/03/2014    No numbness or tingling in the feet Foot exam done in 3/15     Examination:   BP 130/72 mmHg  Pulse 85  Temp(Src) 97.9 F (36.6 C) (Oral)  Ht 5\' 10"  (1.778 m)  Wt 188 lb (85.276 kg)  BMI 26.98 kg/m2  SpO2 97%  Body mass index is 26.98  kg/(m^2).    No  edema present  ASSESSMENT/ PLAN:   Diabetes type 2   The patient's diabetes control is worse now with A1c going up This is most likely because of this irregular compliance with his insulin both basal and bolus  Also difficult to determine by history what exactly he is doing with his insulin He probably does have benefit from taking Victoza which she is getting through the company and does not always have high postprandial readings  Somewhat variable also with his diet and exercise regimen   HYPOKALEMIA: Potassium 3.4 and will need supplementation  Hypertension: Blood pressure is near normal, currently being managed by PCP   Plan: Since his fasting readings are usually fairly good he will continue the same dose of Levemir Discussed needing Regular Insulin at least at suppertime and he will take this consistently If he is able to get the Humalog insulin with the vial he can try this, given coupon from manufacturer Discussed blood sugar targets at various times in need to check more readings after meals and not necessarily daily in the morning May need to adjust this based on postprandial readings and also may reduce it for lower fat meals Also discussed that if fasting sugars started coming down with his evening insulin regimen may need to reduce Levemir further Resume exercise regularly and try to walk at least 20 minutes Consistent diet  Take potassium consistently  Patient Instructions  Start Novolin R insulin 18 units 30 min before supper; may reduce dose if sugar below 100 after eating or eating small meals     Counseling time over 50% of today's 25 minute visit   Guillaume Weninger 07/07/2014, 10:02 AM

## 2014-07-06 NOTE — Patient Instructions (Addendum)
Start Novolin R insulin 18 units 30 min before supper; may reduce dose if sugar below 100 after eating or eating small meals

## 2014-07-25 ENCOUNTER — Other Ambulatory Visit: Payer: Self-pay | Admitting: *Deleted

## 2014-07-25 ENCOUNTER — Telehealth: Payer: Self-pay | Admitting: Endocrinology

## 2014-07-25 MED ORDER — GLUCOSE BLOOD VI STRP: ORAL_STRIP | Status: DC

## 2014-07-25 MED ORDER — ONETOUCH DELICA LANCETS FINE MISC: Status: DC

## 2014-07-25 NOTE — Telephone Encounter (Signed)
Pt called and needs a needles for new machine, please call pt back

## 2014-08-15 ENCOUNTER — Other Ambulatory Visit (INDEPENDENT_AMBULATORY_CARE_PROVIDER_SITE_OTHER): Payer: Commercial Managed Care - HMO

## 2014-08-15 DIAGNOSIS — IMO0002 Reserved for concepts with insufficient information to code with codable children: Secondary | ICD-10-CM

## 2014-08-15 DIAGNOSIS — E1165 Type 2 diabetes mellitus with hyperglycemia: Secondary | ICD-10-CM | POA: Diagnosis not present

## 2014-08-15 LAB — BASIC METABOLIC PANEL
BUN: 11 mg/dL (ref 6–23)
CO2: 28 mEq/L (ref 19–32)
CREATININE: 1.05 mg/dL (ref 0.40–1.50)
Calcium: 9.5 mg/dL (ref 8.4–10.5)
Chloride: 104 mEq/L (ref 96–112)
GFR: 89.38 mL/min (ref >60.00)
Glucose, Bld: 89 mg/dL (ref 70–99)
POTASSIUM: 3.5 meq/L (ref 3.5–5.1)
SODIUM: 139 meq/L (ref 135–145)

## 2014-08-16 LAB — FRUCTOSAMINE: Fructosamine: 283 umol/L (ref 0–285)

## 2014-08-18 ENCOUNTER — Other Ambulatory Visit: Payer: Self-pay | Admitting: *Deleted

## 2014-08-18 ENCOUNTER — Encounter: Payer: Self-pay | Admitting: Endocrinology

## 2014-08-18 ENCOUNTER — Ambulatory Visit (INDEPENDENT_AMBULATORY_CARE_PROVIDER_SITE_OTHER): Payer: Commercial Managed Care - HMO | Admitting: Endocrinology

## 2014-08-18 VITALS — BP 128/68 | HR 87 | Temp 98.3°F | Resp 14 | Ht 70.0 in | Wt 192.2 lb

## 2014-08-18 DIAGNOSIS — IMO0002 Reserved for concepts with insufficient information to code with codable children: Secondary | ICD-10-CM

## 2014-08-18 DIAGNOSIS — E876 Hypokalemia: Secondary | ICD-10-CM | POA: Diagnosis not present

## 2014-08-18 DIAGNOSIS — E1165 Type 2 diabetes mellitus with hyperglycemia: Secondary | ICD-10-CM

## 2014-08-18 MED ORDER — METFORMIN HCL 1000 MG PO TABS: ORAL_TABLET | ORAL | Status: DC

## 2014-08-18 NOTE — Patient Instructions (Addendum)
REDUCE REG INSULIN TO 18 UNITS  Before supper  Bike daily

## 2014-08-18 NOTE — Progress Notes (Signed)
Patient ID: [name], male   DOB: 04-04-1941, 74 y.o.   MRN: [id number]   Reason for Appointment: Diabetes follow-up   History of Present Illness   Diagnosis: Type 2 DIABETES MELITUS, diagnosis 2003?  PREVIOUS history: [name] had been initially treated with various oral hypoglycemic drugs including metformin. Because of failure of control [name] was started on insulin in about 2007 Despite taking basal bolus insulin his A1c had been persistently over 8% and was 9.6% when first seen in consultation With starting Victoza in 12/2011 his blood sugars had improved significantly and A1c had come down to as low as 7% but was back up to 8.4% and 3/14    His blood sugars have been typically difficult to control in the past In 8/15 with starting a regular exercise program his overall control had improved   RECENT history:   A1c had gone up to 8.6% in 06/2014 because of difficulty with getting all his insulin supplies and cost of medications Will recently has been able to get his Levemir consistently and has taken it in the morning daily. Also [name] has now switched Humalog to regular insulin from [facility]; [name] was told to take 18 units but is taking 20.  [name] is following instructions on taking this 30 minutes before eating Fructosamine indicates control, the level is high normal  Current blood sugar patterns and problems identified   His fasting blood sugars are fairly consistent, still taking Levemir in the morning  Blood sugars after lunch are mostly near normal  Has sporadic high readings after his evening meal but blood sugars are not averaging more than 130 at any time  No hypoglycemia reported and lowest blood sugar is 82  [name] is trying to do some exercise on his bike but only every other day  His weight has gone up a little bit but possibly from better glucose control  Oral hypoglycemic drugs: 1 g metformin        Side effects from medications: None Insulin regimen: Levemir 38 units in  am, [facility] brand regular insulin 20 units before supper       Monitors blood glucose:  2.7 times a day.    Glucometer: One Touch.          Blood Glucose readings from meter download:   PRE-MEAL Breakfast Lunch Dinner Bedtime Overall  Glucose range:  82-156      82-201   Median:  111      112    POST-MEAL PC Breakfast PC Lunch PC Dinner/hs  Glucose range:   91-130   95-201   Mean/median:    120    Hypoglycemia: none .          Meals: 3 meals per day.  For breakfast has eggs grits/toast, supper around 7 PM Physical activity: exercise: Exercise bike qod         Dietician visit: 2013 with diabetes nurse practitioner  Wt Readings from Last 3 Encounters:  08/18/14 192 lb 3.2 oz (87.181 kg)  07/06/14 188 lb (85.276 kg)  04/05/14 192 lb (87.091 kg)   Lab Results  Component Value Date   HGBA1C 8.6* 07/03/2014   HGBA1C 7.1* 03/31/2014   HGBA1C 6.9* 12/13/2013   Lab Results  Component Value Date   MICROALBUR 6.9* 07/03/2014   [place] 94 07/03/2014   CREATININE 1.05 08/15/2014   Lab on 08/15/2014  Component Date Value Ref Range Status  . Fructosamine 08/15/2014 283  0 - 285 umol/L Final   Comment:  Published reference interval for apparently healthy subjects between age 24 and 81 is 81 - 285 umol/L and in a poorly controlled diabetic population is 228 - 563 umol/L with a mean of 396 umol/L.   Marland Kitchen Sodium 08/15/2014 139  135 - 145 mEq/L Final  . Potassium 08/15/2014 3.5  3.5 - 5.1 mEq/L Final  . Chloride 08/15/2014 104  96 - 112 mEq/L Final  . CO2 08/15/2014 28  19 - 32 mEq/L Final  . Glucose, Bld 08/15/2014 89  70 - 99 mg/dL Final  . BUN 08/15/2014 11  6 - 23 mg/dL Final  . Creatinine, Ser 08/15/2014 1.05  0.40 - 1.50 mg/dL Final  . Calcium 08/15/2014 9.5  8.4 - 10.5 mg/dL Final  . GFR 08/15/2014 89.38  >60.00 mL/min Final       Medication List       This list is accurate as of: 08/18/14  3:53 PM.  Always use your most recent med list.               amLODipine 10 MG  tablet  Commonly known as:  NORVASC  Take 10 mg by mouth daily.     diphenhydrAMINE 25 MG tablet  Commonly known as:  BENADRYL  1 tablet twice a day for 5 days     dorzolamide-timolol 22.3-6.8 MG/ML ophthalmic solution  Commonly known as:  COSOPT     famotidine 20 MG tablet  Commonly known as:  PEPCID  Take 1 tablet (20 mg total) by mouth daily.     ferrous fumarate 325 (106 FE) MG Tabs tablet  Commonly known as:  HEMOCYTE - 106 mg FE  Take 1 tablet by mouth 3 (three) times daily.     fish oil-omega-3 fatty acids 1000 MG capsule  Take 2 g by mouth daily.     glucose blood test strip  Commonly known as:  ONE TOUCH ULTRA TEST  USE AS INSTRUCTED TO CHECK BLOOD SUGARS THREE TIMES DAILY Dx code E11.65     Insulin Detemir 100 UNIT/ML Pen  Commonly known as:  LEVEMIR FLEXTOUCH  Inject 38 units daily     latanoprost 0.005 % ophthalmic solution  Commonly known as:  XALATAN     Liraglutide 18 MG/3ML Sopn  Commonly known as:  VICTOZA  Inject 1.2 mg into the skin daily.     metFORMIN 1000 MG tablet  Commonly known as:  GLUCOPHAGE  TAKE 1/2 TABLET EACH MORNING AND 1 AT DINNER     metoprolol succinate 25 MG 24 hr tablet  Commonly known as:  TOPROL-XL  Take 25 mg by mouth daily.     metoprolol tartrate 25 MG tablet  Commonly known as:  LOPRESSOR     ONETOUCH DELICA LANCETS FINE Misc  Use to check blood sugar 3 times per day Dx code E11.65     phenazopyridine 200 MG tablet  Commonly known as:  PYRIDIUM  Take 200 mg by mouth 3 (three) times daily as needed.     potassium chloride SA 20 MEQ tablet  Commonly known as:  K-DUR,KLOR-CON  take 1 tablet daily     simvastatin 20 MG tablet  Commonly known as:  ZOCOR  Take 20 mg by mouth daily.     tamsulosin 0.4 MG Caps capsule  Commonly known as:  FLOMAX  Take by mouth.     TRAVATAN Z 0.004 % Soln ophthalmic solution  Generic drug:  Travoprost (BAK Free)     ULORIC 40 MG tablet  Generic  drug:  febuxostat         Allergies:  Allergies  Allergen Reactions  . Ace Inhibitors Other (See Comments)    angioedema  . Allopurinol Rash  . Aspirin     Unknown reaction     Past Medical History  Diagnosis Date  . Hypertension   . Diabetes mellitus     insulin dependent  . Hypercholesterolemia   . Sarcoidosis   . Unspecified glaucoma   . Heart disease, unspecified   . Coronary artery disease     Past Surgical History  Procedure Laterality Date  . Knee replacement surgery      right  . Cataract extraction      Family History  Problem Relation Age of Onset  . Cancer Neg Hx   . Hypertension Mother   . Diabetes Mellitus I Mother   . Hypertension Father   . Coronary artery disease Father   . Hypertension Sister   . Hypertension Brother   . Coronary artery disease Brother   . Diabetes Mellitus I Brother   . Diabetes Mellitus I Brother     Social History:  reports that [name] quit smoking about 9 years ago. His smoking use included Cigarettes. [name] smoked 0.50 packs per day. [name] has never used smokeless tobacco. [name] reports that [name] does not drink alcohol or use illicit drugs.  Review of Systems:  HYPERTENSION:   Usually fairly well controlled with Lotrel and metoprolol    HYPOKALEMIA:  [name] does not appear to be taking any diuretics and not clear why his potassium low, will continue supplements.  Since his blood pressure is easily controlled [name] may not have hyperaldosteronism  Lab Results  Component Value Date   CREATININE 1.05 08/15/2014   BUN 11 08/15/2014   NA 139 08/15/2014   K 3.5 08/15/2014   CL 104 08/15/2014   CO2 28 08/15/2014     HYPERLIPIDEMIA: The lipid abnormality consists of elevated LDL treated with simvastatin.  Also has low HDL   Lab Results  Component Value Date   CHOL 144 07/03/2014   HDL 33.00* 07/03/2014   LDLCALC 94 07/03/2014   TRIG 84.0 07/03/2014   CHOLHDL 4 07/03/2014    No numbness or tingling in the feet Foot exam done in 3/15     Examination:    BP 128/68 mmHg  Pulse 87  Temp(Src) 98.3 F (36.8 C)  Resp 14  Ht 5\' 10"  (1.778 m)  Wt 192 lb 3.2 oz (87.181 kg)  BMI 27.58 kg/m2  SpO2 94%  Body mass index is 27.58 kg/(m^2).    No  edema present  ASSESSMENT/ PLAN:   Diabetes type 2   The patient's diabetes control is much better with improving his compliance with his insulin His blood sugars are excellent now most of the time with taking Levemir in the morning and Regular Insulin at suppertime [name] has done some readings at different times of the day including after meals and recently these are excellent [name] has some high readings because [name] is variable also with his diet and does not exercise daily  HYPOKALEMIA: Potassium is slightly better at 3.5 and can continue supplements     Plan: Since his fasting readings are usually fairly good [name] will continue the same dose of Levemir Also because of potential for low sugars during the night with regular insulin and occasional low normal readings at bedtime will reduce regular insulin to 18   Also discussed that if fasting sugars started coming  down with his evening insulin regimen may need to reduce Levemir further Try to exercise daily Consistent diet  Take potassium daily  Patient Instructions  REDUCE REG INSULIN TO 18 UNITS  Before supper  Bike daily    Counseling time over 50% of today's 25 minute visit   Tanique Matney 08/18/2014, 3:53 PM

## 2014-08-31 DIAGNOSIS — H18413 Arcus senilis, bilateral: Secondary | ICD-10-CM | POA: Diagnosis not present

## 2014-08-31 DIAGNOSIS — H04123 Dry eye syndrome of bilateral lacrimal glands: Secondary | ICD-10-CM | POA: Diagnosis not present

## 2014-08-31 DIAGNOSIS — H11153 Pinguecula, bilateral: Secondary | ICD-10-CM | POA: Diagnosis not present

## 2014-08-31 DIAGNOSIS — H4011X3 Primary open-angle glaucoma, severe stage: Secondary | ICD-10-CM | POA: Diagnosis not present

## 2014-08-31 DIAGNOSIS — Z9849 Cataract extraction status, unspecified eye: Secondary | ICD-10-CM | POA: Diagnosis not present

## 2014-09-19 DIAGNOSIS — K006 Disturbances in tooth eruption: Secondary | ICD-10-CM | POA: Diagnosis not present

## 2014-09-19 DIAGNOSIS — R69 Illness, unspecified: Secondary | ICD-10-CM | POA: Diagnosis not present

## 2014-10-05 DIAGNOSIS — Z961 Presence of intraocular lens: Secondary | ICD-10-CM | POA: Diagnosis not present

## 2014-10-05 DIAGNOSIS — H4011X2 Primary open-angle glaucoma, moderate stage: Secondary | ICD-10-CM | POA: Diagnosis not present

## 2014-10-11 DIAGNOSIS — M109 Gout, unspecified: Secondary | ICD-10-CM | POA: Diagnosis not present

## 2014-10-11 DIAGNOSIS — E785 Hyperlipidemia, unspecified: Secondary | ICD-10-CM | POA: Diagnosis not present

## 2014-10-11 DIAGNOSIS — I10 Essential (primary) hypertension: Secondary | ICD-10-CM | POA: Diagnosis not present

## 2014-11-01 DIAGNOSIS — C61 Malignant neoplasm of prostate: Secondary | ICD-10-CM | POA: Diagnosis not present

## 2014-11-01 DIAGNOSIS — D090 Carcinoma in situ of bladder: Secondary | ICD-10-CM | POA: Diagnosis not present

## 2014-11-06 DIAGNOSIS — H4089 Other specified glaucoma: Secondary | ICD-10-CM | POA: Diagnosis not present

## 2014-11-06 DIAGNOSIS — H4011X2 Primary open-angle glaucoma, moderate stage: Secondary | ICD-10-CM | POA: Diagnosis not present

## 2014-11-14 ENCOUNTER — Other Ambulatory Visit: Payer: Commercial Managed Care - HMO

## 2014-11-17 ENCOUNTER — Ambulatory Visit: Payer: Commercial Managed Care - HMO | Admitting: Endocrinology

## 2014-12-01 ENCOUNTER — Telehealth: Payer: Self-pay | Admitting: Endocrinology

## 2014-12-01 ENCOUNTER — Other Ambulatory Visit: Payer: Self-pay | Admitting: *Deleted

## 2014-12-01 MED ORDER — GLUCOSE BLOOD VI STRP: ORAL_STRIP | Status: DC

## 2014-12-01 NOTE — Telephone Encounter (Signed)
Patient would like a refill on his Rx   Rx: One Touch Ultra   Pharmacy: [facility] [address]   Thank you

## 2014-12-01 NOTE — Telephone Encounter (Signed)
rx was printed and ready to be faxed, there is no [facility] on [address].   I tried calling the patient back but there was no answer

## 2014-12-08 ENCOUNTER — Other Ambulatory Visit (INDEPENDENT_AMBULATORY_CARE_PROVIDER_SITE_OTHER): Payer: Commercial Managed Care - HMO

## 2014-12-08 DIAGNOSIS — E1165 Type 2 diabetes mellitus with hyperglycemia: Secondary | ICD-10-CM

## 2014-12-08 DIAGNOSIS — IMO0002 Reserved for concepts with insufficient information to code with codable children: Secondary | ICD-10-CM

## 2014-12-08 LAB — MICROALBUMIN / CREATININE URINE RATIO
Creatinine,U: 103.3 mg/dL
Microalb Creat Ratio: 5.4 mg/g (ref 0.0–30.0)
Microalb, Ur: 5.6 mg/dL — ABNORMAL HIGH (ref 0.0–1.9)

## 2014-12-08 LAB — COMPREHENSIVE METABOLIC PANEL
ALT: 13 U/L (ref 0–53)
AST: 14 U/L (ref 0–37)
Albumin: 3.8 g/dL (ref 3.5–5.2)
Alkaline Phosphatase: 59 U/L (ref 39–117)
BUN: 8 mg/dL (ref 6–23)
CHLORIDE: 102 meq/L (ref 96–112)
CO2: 32 mEq/L (ref 19–32)
Calcium: 9.1 mg/dL (ref 8.4–10.5)
Creatinine, Ser: 1 mg/dL (ref 0.40–1.50)
GFR: 94.48 mL/min (ref >60.00)
GLUCOSE: 135 mg/dL — AB (ref 70–99)
POTASSIUM: 3 meq/L — AB (ref 3.5–5.1)
Sodium: 142 mEq/L (ref 135–145)
Total Bilirubin: 0.5 mg/dL (ref 0.2–1.2)
Total Protein: 7.7 g/dL (ref 6.0–8.3)

## 2014-12-08 LAB — HEMOGLOBIN A1C: HEMOGLOBIN A1C: 7.6 % — AB (ref 4.6–6.5)

## 2014-12-13 ENCOUNTER — Other Ambulatory Visit: Payer: Self-pay | Admitting: *Deleted

## 2014-12-13 ENCOUNTER — Ambulatory Visit (INDEPENDENT_AMBULATORY_CARE_PROVIDER_SITE_OTHER): Payer: Commercial Managed Care - HMO | Admitting: Endocrinology

## 2014-12-13 VITALS — BP 128/64 | HR 94 | Temp 98.3°F | Resp 16 | Ht 70.0 in | Wt 190.4 lb

## 2014-12-13 DIAGNOSIS — E1165 Type 2 diabetes mellitus with hyperglycemia: Secondary | ICD-10-CM

## 2014-12-13 DIAGNOSIS — IMO0002 Reserved for concepts with insufficient information to code with codable children: Secondary | ICD-10-CM

## 2014-12-13 DIAGNOSIS — E876 Hypokalemia: Secondary | ICD-10-CM | POA: Diagnosis not present

## 2014-12-13 MED ORDER — POTASSIUM CHLORIDE CRYS ER 20 MEQ PO TBCR: EXTENDED_RELEASE_TABLET | ORAL | Status: DC

## 2014-12-13 MED ORDER — INSULIN NPH (HUMAN) (ISOPHANE) 100 UNIT/ML ~~LOC~~ SUSP: SUBCUTANEOUS | Status: DC

## 2014-12-13 NOTE — Progress Notes (Signed)
Patient ID: [name], male   DOB: 1941/04/14, 74 y.o.   MRN: [id number]   Reason for Appointment: Diabetes follow-up   History of Present Illness   Diagnosis: Type 2 DIABETES MELITUS, diagnosis 2003?  PREVIOUS history: He had been initially treated with various oral hypoglycemic drugs including metformin. Because of failure of control he was started on insulin in about 2007 Despite taking basal bolus insulin his A1c had been persistently over 8% and was 9.6% when first seen in consultation With starting Victoza in 12/2011 his blood sugars had improved significantly and A1c had come down to as low as 7% but was back up to 8.4% and 3/14    His blood sugars have been typically difficult to control in the past In 8/15 with starting a regular exercise program his overall control had improved   RECENT history:   Insulin regimen: Levemir 38 units in am, was on [facility] brand regular insulin 20 units before supper  A1c had gone up to 8.6% in 06/2014 because of difficulty with getting all his insulin supplies and cost of medications Although he has been able to get his Levemir he is still complaining about the cost of the medication Also taking Victoza  For some reason he has not been taking the regular insulin that he was taking before supper for unknown duration of time He is a poor historian and difficult to know his daily routine, frequency of monitoring and actual blood sugar levels Did not bring his monitor for review today  His A1c is however relatively better compared to previous one of 8.6, however has been as low as 7.1  Current blood sugar patterns and problems identified   His fasting blood sugars are reportedly fairly good but not clear how consistently  He thinks his blood sugars are better before dinner but not clear again how often he checks them  Blood sugars are frequently higher after supper but he does not remember many readings  Oral hypoglycemic drugs: 1 g  metformin        Side effects from medications: None       Monitors blood glucose: ?  Frequency .    Glucometer: One Touch.          Blood Glucose readings from recall  Mean values apply above for all meters except median for One Touch  PRE-MEAL Fasting Lunch Dinner Bedtime Overall  Glucose range: 106  120 120-200   Mean/median:          Hypoglycemia: none .          Meals: 3 meals per day.  For breakfast has eggs grits/toast at 9 , supper around 7 PM Physical activity: exercise: Exercise bike for up to 20 minutes about twice a week now, does not like to walk         Dietician visit: 2013 with diabetes nurse practitioner  Wt Readings from Last 3 Encounters:  12/13/14 190 lb 6.4 oz (86.365 kg)  08/18/14 192 lb 3.2 oz (87.181 kg)  07/06/14 188 lb (85.276 kg)   Lab Results  Component Value Date   HGBA1C 7.6* 12/08/2014   HGBA1C 8.6* 07/03/2014   HGBA1C 7.1* 03/31/2014   Lab Results  Component Value Date   MICROALBUR 5.6* 12/08/2014   [place] 94 07/03/2014   CREATININE 1.00 12/08/2014   Appointment on 12/08/2014  Component Date Value Ref Range Status  . Hgb A1c MFr Bld 12/08/2014 7.6* 4.6 - 6.5 % Final   Glycemic Control Guidelines  for People with Diabetes:Non Diabetic:  <6%Goal of Therapy: <7%Additional Action Suggested:  >8%   . Microalb, Ur 12/08/2014 5.6* 0.0 - 1.9 mg/dL Final  . Creatinine,U 12/08/2014 103.3   Final  . Microalb Creat Ratio 12/08/2014 5.4  0.0 - 30.0 mg/g Final  . Sodium 12/08/2014 142  135 - 145 mEq/L Final  . Potassium 12/08/2014 3.0* 3.5 - 5.1 mEq/L Final  . Chloride 12/08/2014 102  96 - 112 mEq/L Final  . CO2 12/08/2014 32  19 - 32 mEq/L Final  . Glucose, Bld 12/08/2014 135* 70 - 99 mg/dL Final  . BUN 12/08/2014 8  6 - 23 mg/dL Final  . Creatinine, Ser 12/08/2014 1.00  0.40 - 1.50 mg/dL Final  . Total Bilirubin 12/08/2014 0.5  0.2 - 1.2 mg/dL Final  . Alkaline Phosphatase 12/08/2014 59  39 - 117 U/L Final  . AST 12/08/2014 14  0 - 37 U/L Final    . ALT 12/08/2014 13  0 - 53 U/L Final  . Total Protein 12/08/2014 7.7  6.0 - 8.3 g/dL Final  . Albumin 12/08/2014 3.8  3.5 - 5.2 g/dL Final  . Calcium 12/08/2014 9.1  8.4 - 10.5 mg/dL Final  . GFR 12/08/2014 94.48  >60.00 mL/min Final       Medication List       This list is accurate as of: 12/13/14  3:10 PM.  Always use your most recent med list.               amLODipine 10 MG tablet  Commonly known as:  NORVASC  Take 10 mg by mouth daily.     atropine 1 % ophthalmic solution     diphenhydrAMINE 25 MG tablet  Commonly known as:  BENADRYL  1 tablet twice a day for 5 days     dorzolamide-timolol 22.3-6.8 MG/ML ophthalmic solution  Commonly known as:  COSOPT     DUREZOL 0.05 % Emul  Generic drug:  Difluprednate     famotidine 20 MG tablet  Commonly known as:  PEPCID  Take 1 tablet (20 mg total) by mouth daily.     ferrous fumarate 325 (106 FE) MG Tabs tablet  Commonly known as:  HEMOCYTE - 106 mg FE  Take 1 tablet by mouth 3 (three) times daily.     fish oil-omega-3 fatty acids 1000 MG capsule  Take 2 g by mouth daily.     glucose blood test strip  Commonly known as:  ONE TOUCH ULTRA TEST  USE AS INSTRUCTED TO CHECK BLOOD SUGARS THREE TIMES DAILY Dx code E11.65     Insulin Detemir 100 UNIT/ML Pen  Commonly known as:  LEVEMIR FLEXTOUCH  Inject 38 units daily     latanoprost 0.005 % ophthalmic solution  Commonly known as:  XALATAN     Liraglutide 18 MG/3ML Sopn  Commonly known as:  VICTOZA  Inject 1.2 mg into the skin daily.     metFORMIN 1000 MG tablet  Commonly known as:  GLUCOPHAGE  TAKE 1/2 TABLET EACH MORNING AND 1 AT DINNER     metoprolol succinate 25 MG 24 hr tablet  Commonly known as:  TOPROL-XL  Take 25 mg by mouth daily.     metoprolol tartrate 25 MG tablet  Commonly known as:  LOPRESSOR     ONETOUCH DELICA LANCETS FINE Misc  Use to check blood sugar 3 times per day Dx code E11.65     phenazopyridine 200 MG tablet  Commonly known as:  PYRIDIUM  Take 200 mg by mouth 3 (three) times daily as needed.     potassium chloride SA 20 MEQ tablet  Commonly known as:  K-DUR,KLOR-CON  take 1 tablet daily     prednisoLONE acetate 1 % ophthalmic suspension  Commonly known as:  PRED FORTE     simvastatin 20 MG tablet  Commonly known as:  ZOCOR  Take 20 mg by mouth daily.     tamsulosin 0.4 MG Caps capsule  Commonly known as:  FLOMAX  Take by mouth.     TRAVATAN Z 0.004 % Soln ophthalmic solution  Generic drug:  Travoprost (BAK Free)     ULORIC 40 MG tablet  Generic drug:  febuxostat        Allergies:  Allergies  Allergen Reactions  . Ace Inhibitors Other (See Comments)    angioedema  . Allopurinol Rash  . Aspirin     Unknown reaction     Past Medical History  Diagnosis Date  . Hypertension   . Diabetes mellitus     insulin dependent  . Hypercholesterolemia   . Sarcoidosis   . Unspecified glaucoma   . Heart disease, unspecified   . Coronary artery disease     Past Surgical History  Procedure Laterality Date  . Knee replacement surgery      right  . Cataract extraction      Family History  Problem Relation Age of Onset  . Cancer Neg Hx   . Hypertension Mother   . Diabetes Mellitus I Mother   . Hypertension Father   . Coronary artery disease Father   . Hypertension Sister   . Hypertension Brother   . Coronary artery disease Brother   . Diabetes Mellitus I Brother   . Diabetes Mellitus I Brother     Social History:  reports that he quit smoking about 9 years ago. His smoking use included Cigarettes. He smoked 0.50 packs per day. He has never used smokeless tobacco. He reports that he does not drink alcohol or use illicit drugs.  Review of Systems:  HYPERTENSION:   Usually fairly well controlled with Lotrel and metoprolol    HYPOKALEMIA:  He does not appear to be taking any diuretics and not clear why his potassium low, will continue supplements.  Since his blood pressure is easily  controlled he may not have hyperaldosteronism  Lab Results  Component Value Date   CREATININE 1.00 12/08/2014   BUN 8 12/08/2014   NA 142 12/08/2014   K 3.0* 12/08/2014   CL 102 12/08/2014   CO2 32 12/08/2014     HYPERLIPIDEMIA: The lipid abnormality consists of elevated LDL treated with simvastatin.  Also has low HDL   Lab Results  Component Value Date   CHOL 144 07/03/2014   HDL 33.00* 07/03/2014   LDLCALC 94 07/03/2014   TRIG 84.0 07/03/2014   CHOLHDL 4 07/03/2014    No numbness or tingling in the feet Foot exam done in 8/16     Examination:   BP 128/64 mmHg  Pulse 94  Temp(Src) 98.3 F (36.8 C)  Resp 16  Ht 5\' 10"  (1.778 m)  Wt 190 lb 6.4 oz (86.365 kg)  BMI 27.32 kg/m2  SpO2 94%  Body mass index is 27.32 kg/(m^2).    No  edema present Diabetic foot exam shows normal monofilament sensation in the toes and plantar surfaces, no skin lesions or ulcers on the feet and normal pedal pulses   ASSESSMENT/ PLAN:   Diabetes type  2  See history of present illness for detailed discussion of his current management, blood sugar patterns and problems identified  The patient's diabetes control is not as well controlled with his leaving off mealtime coverage with regular insulin that he had been taking before He is a little confused about need for mealtime coverage at least at his main meal Difficult to get an idea of his blood sugar patterns as he did not bring his monitor today and cannot remember readings from recent testing well enough Also not clear if he does need mealtime coverage at all meals  Since he thinks his blood sugars can be over 200 after supper he can start back on regular insulin at least with 10 units May need higher doses if sugars are still over 180 after meal in the evening Also will need to review blood sugars after breakfast and lunch and discussed when to check his blood sugars as well as desired levels  Since he appears to have fairly good  fasting readings by recall he can continue the same dose of Levemir; consider switching this to morning or using twice a day dose based on his blood sugar patterns Emphasized the need to bring meter on each visit and he will come back in 6 weeks for short-term follow-up  Because of his concerns about cost he can try the [facility] brand of regular insulin Again offered that he can check with his insurance company if he has less expensive alternative to the Levemir but he wants to continue now  HYPOKALEMIA: Potassium is significantly lower 3.0, reportedly on no diuretics and not clear why he has low levels Does not have significant hypertension to consider hyperaldosteronism He will resume potassium supplements and also follow-up with PCP Check magnesium level on the next visit    Patient Instructions  Take 10 units regular insulin 30 min before supper, if sugar after supper still >180 go up to 14units at least  Check blood sugars on waking up .. 3 .. times a week Also check blood sugars about 2 hours after a meal and do this after different meals by rotation Recommended blood sugar levels on waking up is 90-130 and about 2 hours after meal is 140-180 Please bring blood sugar monitor to each visit.   Counseling time on subjects discussed above is over 50% of today's 25 minute visit    Eliyana Pagliaro 12/13/2014, 3:10 PM

## 2014-12-13 NOTE — Patient Instructions (Addendum)
Take 10 units regular insulin 30 min before supper, if sugar after supper still >180 go up to 14units at least  Check blood sugars on waking up .. 3 .. times a week Also check blood sugars about 2 hours after a meal and do this after different meals by rotation Recommended blood sugar levels on waking up is 90-130 and about 2 hours after meal is 140-180 Please bring blood sugar monitor to each visit.

## 2015-01-09 ENCOUNTER — Telehealth: Payer: Self-pay

## 2015-01-09 NOTE — Telephone Encounter (Signed)
Left pt a Vm to return call to make him aware of patient assistance medication was delivered in office.

## 2015-01-15 ENCOUNTER — Other Ambulatory Visit: Payer: Self-pay | Admitting: Endocrinology

## 2015-01-19 ENCOUNTER — Other Ambulatory Visit (INDEPENDENT_AMBULATORY_CARE_PROVIDER_SITE_OTHER): Payer: Commercial Managed Care - HMO

## 2015-01-19 DIAGNOSIS — E876 Hypokalemia: Secondary | ICD-10-CM

## 2015-01-19 DIAGNOSIS — E1165 Type 2 diabetes mellitus with hyperglycemia: Secondary | ICD-10-CM

## 2015-01-19 DIAGNOSIS — IMO0002 Reserved for concepts with insufficient information to code with codable children: Secondary | ICD-10-CM

## 2015-01-19 LAB — BASIC METABOLIC PANEL
BUN: 10 mg/dL (ref 6–23)
CHLORIDE: 105 meq/L (ref 96–112)
CO2: 27 mEq/L (ref 19–32)
Calcium: 9.4 mg/dL (ref 8.4–10.5)
Creatinine, Ser: 1.14 mg/dL (ref 0.40–1.50)
GFR: 81.19 mL/min (ref >60.00)
GLUCOSE: 141 mg/dL — AB (ref 70–99)
POTASSIUM: 3.7 meq/L (ref 3.5–5.1)
SODIUM: 140 meq/L (ref 135–145)

## 2015-01-19 LAB — MAGNESIUM: MAGNESIUM: 1.6 mg/dL (ref 1.5–2.5)

## 2015-01-20 LAB — FRUCTOSAMINE: FRUCTOSAMINE: 283 umol/L (ref 0–285)

## 2015-01-24 ENCOUNTER — Ambulatory Visit: Payer: Commercial Managed Care - HMO | Admitting: Endocrinology

## 2015-01-30 ENCOUNTER — Ambulatory Visit (INDEPENDENT_AMBULATORY_CARE_PROVIDER_SITE_OTHER): Payer: Commercial Managed Care - HMO | Admitting: Endocrinology

## 2015-01-30 ENCOUNTER — Encounter: Payer: Self-pay | Admitting: Endocrinology

## 2015-01-30 VITALS — BP 134/68 | HR 87 | Temp 98.3°F | Resp 16 | Ht 70.0 in | Wt 193.8 lb

## 2015-01-30 DIAGNOSIS — E1165 Type 2 diabetes mellitus with hyperglycemia: Secondary | ICD-10-CM

## 2015-01-30 DIAGNOSIS — Z794 Long term (current) use of insulin: Secondary | ICD-10-CM | POA: Diagnosis not present

## 2015-01-30 DIAGNOSIS — E782 Mixed hyperlipidemia: Secondary | ICD-10-CM

## 2015-01-30 DIAGNOSIS — E876 Hypokalemia: Secondary | ICD-10-CM | POA: Diagnosis not present

## 2015-01-30 NOTE — Progress Notes (Signed)
Patient ID: [name], male   DOB: 12-14-1940, 74 y.o.   MRN: [id number]   Reason for Appointment: Diabetes follow-up   History of Present Illness   Diagnosis: Type 2 DIABETES MELITUS, diagnosis 2003?  PREVIOUS history: He had been initially treated with various oral hypoglycemic drugs including metformin. Because of failure of control he was started on insulin in about 2007 Despite taking basal bolus insulin his A1c had been persistently over 8% and was 9.6% when first seen in consultation With starting Victoza in 12/2011 his blood sugars had improved significantly and A1c had come down to as low as 7% but was back up to 8.4% and 3/14    His blood sugars have been typically difficult to control in the past In 8/15 with starting a regular exercise program his overall control had improved   RECENT history:   Insulin regimen: Levemir 38 units acs   A1c had improved down to 7.6 in August However he was still having some postprandial hyperglycemia after supper and he was told to start regular insulin before supper; had been taking this previously at suppertime He however has not done so and his wife is complaining about the cost of multiple medications On his last visit he had not brought his monitor and not clear what his readings were Currently getting his medications through the  mail-order  He is a poor historian and difficult to know his daily routine, frequency of monitoring and actual blood sugar levels Not clear which readings in the evenings are before or after meals as he does not label them  Current blood sugar patterns and problems identified   His fasting blood sugars are overall fairly good and on average mildly increased; however has had readings as low as 95 recently  He still is eating 3 meals a day without any rapid acting insulin  Most of his blood sugars after supper are tending to be high but quite variable  He is apparently getting high sugars when he  is eating foods like ice cream  Does not check readings after lunch; however does have occasional high readings before supper also possibly from snacks  He has not been motivated to exercise much and only a couple of times a week  He is reluctant to see the dietitian because of the cost  Oral hypoglycemic drugs: metformin 1500 mg        Side effects from medications: None       Monitors blood glucose: ?  Frequency .    Glucometer: One Touch.          Blood Glucose readings from recall  Mean values apply above for all meters except median for One Touch  PRE-MEAL Fasting  10-11 AM  Dinner  PCS  Overall  Glucose range:  95-184   84-170   113-211   115-288    Mean/median:  140     175   147     Hypoglycemia: none .          Meals: 2-3 meals per day.  For breakfast has eggs grits/toast at 9 , supper around 7 PM Physical activity: exercise: Exercise bike for up to 20 minutes about twice a week        Dietician visit: 2013 with diabetes nurse practitioner  Wt Readings from Last 3 Encounters:  01/30/15 193 lb 12.8 oz (87.907 kg)  12/13/14 190 lb 6.4 oz (86.365 kg)  08/18/14 192 lb 3.2 oz (87.181 kg)   Lab  Results  Component Value Date   HGBA1C 7.6* 12/08/2014   HGBA1C 8.6* 07/03/2014   HGBA1C 7.1* 03/31/2014   Lab Results  Component Value Date   MICROALBUR 5.6* 12/08/2014   LDLCALC 94 07/03/2014   CREATININE 1.14 01/19/2015   No visits with results within 1 Week(s) from this visit. Latest known visit with results is:  Appointment on 01/19/2015  Component Date Value Ref Range Status  . Sodium 01/19/2015 140  135 - 145 mEq/L Final  . Potassium 01/19/2015 3.7  3.5 - 5.1 mEq/L Final  . Chloride 01/19/2015 105  96 - 112 mEq/L Final  . CO2 01/19/2015 27  19 - 32 mEq/L Final  . Glucose, Bld 01/19/2015 141* 70 - 99 mg/dL Final  . BUN 01/19/2015 10  6 - 23 mg/dL Final  . Creatinine, Ser 01/19/2015 1.14  0.40 - 1.50 mg/dL Final  . Calcium 01/19/2015 9.4  8.4 - 10.5 mg/dL Final    . GFR 01/19/2015 81.19  >60.00 mL/min Final  . Fructosamine 01/19/2015 283  0 - 285 umol/L Final   Comment: Published reference interval for apparently healthy subjects between age 19 and 75 is 43 - 285 umol/L and in a poorly controlled diabetic population is 228 - 563 umol/L with a mean of 396 umol/L.   . Magnesium 01/19/2015 1.6  1.5 - 2.5 mg/dL Final       Medication List       This list is accurate as of: 01/30/15  8:55 PM.  Always use your most recent med list.               amLODipine 10 MG tablet  Commonly known as:  NORVASC  Take 10 mg by mouth daily.     atropine 1 % ophthalmic solution     diphenhydrAMINE 25 MG tablet  Commonly known as:  BENADRYL  1 tablet twice a day for 5 days     dorzolamide-timolol 22.3-6.8 MG/ML ophthalmic solution  Commonly known as:  COSOPT     DUREZOL 0.05 % Emul  Generic drug:  Difluprednate     famotidine 20 MG tablet  Commonly known as:  PEPCID  Take 1 tablet (20 mg total) by mouth daily.     ferrous fumarate 325 (106 FE) MG Tabs tablet  Commonly known as:  HEMOCYTE - 106 mg FE  Take 1 tablet by mouth 3 (three) times daily.     fish oil-omega-3 fatty acids 1000 MG capsule  Take 2 g by mouth daily.     glucose blood test strip  Commonly known as:  ONE TOUCH ULTRA TEST  USE AS INSTRUCTED TO CHECK BLOOD SUGARS THREE TIMES DAILY Dx code E11.65     Insulin Detemir 100 UNIT/ML Pen  Commonly known as:  LEVEMIR FLEXTOUCH  Inject 38 units daily     insulin NPH Human 100 UNIT/ML injection  Commonly known as:  NOVOLIN N  Inject 15 units before supper     latanoprost 0.005 % ophthalmic solution  Commonly known as:  XALATAN     Liraglutide 18 MG/3ML Sopn  Commonly known as:  VICTOZA  Inject 1.2 mg into the skin daily.     metFORMIN 1000 MG tablet  Commonly known as:  GLUCOPHAGE  TAKE 1/2 TABLET EACH MORNING AND 1 TABLET AT DINNER     metoprolol succinate 25 MG 24 hr tablet  Commonly known as:  TOPROL-XL  Take 25 mg  by mouth daily.     metoprolol tartrate 25 MG  tablet  Commonly known as:  LOPRESSOR     ONETOUCH DELICA LANCETS FINE Misc  Use to check blood sugar 3 times per day Dx code E11.65     phenazopyridine 200 MG tablet  Commonly known as:  PYRIDIUM  Take 200 mg by mouth 3 (three) times daily as needed.     potassium chloride SA 20 MEQ tablet  Commonly known as:  K-DUR,KLOR-CON  take 1 tablet daily     prednisoLONE acetate 1 % ophthalmic suspension  Commonly known as:  PRED FORTE     simvastatin 20 MG tablet  Commonly known as:  ZOCOR  Take 20 mg by mouth daily.     tamsulosin 0.4 MG Caps capsule  Commonly known as:  FLOMAX  Take by mouth.     TRAVATAN Z 0.004 % Soln ophthalmic solution  Generic drug:  Travoprost (BAK Free)        Allergies:  Allergies  Allergen Reactions  . Ace Inhibitors Other (See Comments)    angioedema  . Allopurinol Rash  . Aspirin     Unknown reaction     Past Medical History  Diagnosis Date  . Hypertension   . Diabetes mellitus     insulin dependent  . Hypercholesterolemia   . Sarcoidosis ([place])   . Unspecified glaucoma   . Heart disease, unspecified   . Coronary artery disease     Past Surgical History  Procedure Laterality Date  . Knee replacement surgery      right  . Cataract extraction      Family History  Problem Relation Age of Onset  . Cancer Neg Hx   . Hypertension Mother   . Diabetes Mellitus I Mother   . Hypertension Father   . Coronary artery disease Father   . Hypertension Sister   . Hypertension Brother   . Coronary artery disease Brother   . Diabetes Mellitus I Brother   . Diabetes Mellitus I Brother     Social History:  reports that he quit smoking about 9 years ago. His smoking use included Cigarettes. He smoked 0.50 packs per day. He has never used smokeless tobacco. He reports that he does not drink alcohol or use illicit drugs.  Review of Systems:  HYPERTENSION:   Usually fairly well controlled with  Lotrel and metoprolol    HYPOKALEMIA:  He does not appear to be taking any diuretics and not clear why his potassium tends to be low On his last visit he was advised to start potassium supplements and he is doing so with improved levels He is complaining about the need for multiple medications  Lab Results  Component Value Date   CREATININE 1.14 01/19/2015   BUN 10 01/19/2015   NA 140 01/19/2015   K 3.7 01/19/2015   CL 105 01/19/2015   CO2 27 01/19/2015     HYPERLIPIDEMIA: The lipid abnormality consists of elevated LDL treated with simvastatin 20 mg only.  Also has low HDL, lipids are followed by PCP and cardiologist   Lab Results  Component Value Date   CHOL 144 07/03/2014   HDL 33.00* 07/03/2014   LDLCALC 94 07/03/2014   TRIG 84.0 07/03/2014   CHOLHDL 4 07/03/2014    Foot exam done in 8/16, normal     Examination:   BP 134/68 mmHg  Pulse 87  Temp(Src) 98.3 F (36.8 C)  Resp 16  Ht 5\' 10"  (1.778 m)  Wt 193 lb 12.8 oz (87.907 kg)  BMI 27.81 kg/m2  SpO2 95%  Body mass index is 27.81 kg/(m^2).      ASSESSMENT/ PLAN:   Diabetes type 2  See history of present illness for detailed discussion of his current management, blood sugar patterns and problems identified  The patient's diabetes control is variably controlled and does have some postprandial hyperglycemia especially after evening meal and sometimes overnight He has variable control of postprandial readings and this may be related to his carbohydrate intake and foods like ice cream Have recommended that he take regular insulin at least for his large meals and when eating more carbohydrates or sweets but he is refusing to do so as he does not want to purchase another insulin His A1c has been consistently over 7%  He was also advised to see the dietitian but he refuses to consider this because of the cost His wife says that she will try to have him watch his diet better and try to get in to exercise daily He  will call if he has persistently high postprandial readings To recheck A1c on the next visit  Needs annual eye exams  HYPOKALEMIA: Potassium is better with continuing supplements May have some renal wasting of potassium as magnesium level is low and he is not on any known diuretics Advised him to continue supplements indefinitely  HYPERLIPIDEMIA: Recommend increasing his Zocor to 40 mg or changing it to another statin to avoid interaction with Norvasc, will defer to PCP and cardiologist  Patient Instructions  Check blood sugars on waking up .Marland Kitchen2-3 .Marland Kitchen times a week Also check blood sugars about 2 hours after a meal and do this after different meals by rotation  Recommended blood sugar levels on waking up is 90-130 and about 2 hours after meal is 140-180 Please bring blood sugar monitor to each visit.  Exercise bike daily  Reduce sweets      Counseling time on subjects discussed above is over 50% of today's 25 minute visit    Averill Winters 01/30/2015, 8:55 PM

## 2015-01-30 NOTE — Patient Instructions (Signed)
Check blood sugars on waking up .Marland Kitchen2-3 .Marland Kitchen times a week Also check blood sugars about 2 hours after a meal and do this after different meals by rotation  Recommended blood sugar levels on waking up is 90-130 and about 2 hours after meal is 140-180 Please bring blood sugar monitor to each visit.  Exercise bike daily  Reduce sweets

## 2015-02-22 DIAGNOSIS — H401112 Primary open-angle glaucoma, right eye, moderate stage: Secondary | ICD-10-CM | POA: Diagnosis not present

## 2015-02-22 DIAGNOSIS — H401111 Primary open-angle glaucoma, right eye, mild stage: Secondary | ICD-10-CM | POA: Diagnosis not present

## 2015-02-22 DIAGNOSIS — Z961 Presence of intraocular lens: Secondary | ICD-10-CM | POA: Diagnosis not present

## 2015-03-22 DIAGNOSIS — R410 Disorientation, unspecified: Secondary | ICD-10-CM | POA: Diagnosis not present

## 2015-03-22 DIAGNOSIS — Z0000 Encounter for general adult medical examination without abnormal findings: Secondary | ICD-10-CM | POA: Diagnosis not present

## 2015-03-22 DIAGNOSIS — M109 Gout, unspecified: Secondary | ICD-10-CM | POA: Diagnosis not present

## 2015-03-22 DIAGNOSIS — I10 Essential (primary) hypertension: Secondary | ICD-10-CM | POA: Diagnosis not present

## 2015-03-22 DIAGNOSIS — R413 Other amnesia: Secondary | ICD-10-CM | POA: Diagnosis not present

## 2015-03-22 DIAGNOSIS — E785 Hyperlipidemia, unspecified: Secondary | ICD-10-CM | POA: Diagnosis not present

## 2015-03-23 ENCOUNTER — Other Ambulatory Visit: Payer: Self-pay | Admitting: Family Medicine

## 2015-03-23 DIAGNOSIS — R413 Other amnesia: Secondary | ICD-10-CM

## 2015-03-30 ENCOUNTER — Ambulatory Visit
Admission: RE | Admit: 2015-03-30 | Discharge: 2015-03-30 | Disposition: A | Discharge status: Home / Self Care | Payer: Commercial Managed Care - HMO | Source: Ambulatory Visit | Attending: Family Medicine | Admitting: Family Medicine

## 2015-03-30 ENCOUNTER — Other Ambulatory Visit: Payer: Self-pay | Admitting: Family Medicine

## 2015-03-30 DIAGNOSIS — R413 Other amnesia: Secondary | ICD-10-CM

## 2015-03-30 DIAGNOSIS — Z139 Encounter for screening, unspecified: Secondary | ICD-10-CM

## 2015-03-30 DIAGNOSIS — R410 Disorientation, unspecified: Secondary | ICD-10-CM | POA: Diagnosis not present

## 2015-03-30 DIAGNOSIS — Z01818 Encounter for other preprocedural examination: Secondary | ICD-10-CM | POA: Diagnosis not present

## 2015-04-05 DIAGNOSIS — H401122 Primary open-angle glaucoma, left eye, moderate stage: Secondary | ICD-10-CM | POA: Diagnosis not present

## 2015-04-05 DIAGNOSIS — H401113 Primary open-angle glaucoma, right eye, severe stage: Secondary | ICD-10-CM | POA: Diagnosis not present

## 2015-04-05 DIAGNOSIS — H11153 Pinguecula, bilateral: Secondary | ICD-10-CM | POA: Diagnosis not present

## 2015-04-05 DIAGNOSIS — E119 Type 2 diabetes mellitus without complications: Secondary | ICD-10-CM | POA: Diagnosis not present

## 2015-04-05 DIAGNOSIS — Z961 Presence of intraocular lens: Secondary | ICD-10-CM | POA: Diagnosis not present

## 2015-04-05 DIAGNOSIS — H18413 Arcus senilis, bilateral: Secondary | ICD-10-CM | POA: Diagnosis not present

## 2015-04-05 DIAGNOSIS — Z7984 Long term (current) use of oral hypoglycemic drugs: Secondary | ICD-10-CM | POA: Diagnosis not present

## 2015-04-27 ENCOUNTER — Other Ambulatory Visit (INDEPENDENT_AMBULATORY_CARE_PROVIDER_SITE_OTHER): Payer: Commercial Managed Care - HMO

## 2015-04-27 ENCOUNTER — Ambulatory Visit: Payer: Commercial Managed Care - HMO

## 2015-04-27 DIAGNOSIS — Z794 Long term (current) use of insulin: Secondary | ICD-10-CM | POA: Diagnosis not present

## 2015-04-27 DIAGNOSIS — E1165 Type 2 diabetes mellitus with hyperglycemia: Secondary | ICD-10-CM

## 2015-04-27 LAB — COMPREHENSIVE METABOLIC PANEL
ALBUMIN: 3.9 g/dL (ref 3.5–5.2)
ALK PHOS: 58 U/L (ref 39–117)
ALT: 17 U/L (ref 0–53)
AST: 17 U/L (ref 0–37)
BUN: 7 mg/dL (ref 6–23)
CALCIUM: 9.6 mg/dL (ref 8.4–10.5)
CHLORIDE: 103 meq/L (ref 96–112)
CO2: 32 mEq/L (ref 19–32)
Creatinine, Ser: 1.04 mg/dL (ref 0.40–1.50)
GFR: 90.2 mL/min (ref >60.00)
Glucose, Bld: 137 mg/dL — ABNORMAL HIGH (ref 70–99)
POTASSIUM: 3.4 meq/L — AB (ref 3.5–5.1)
Sodium: 142 mEq/L (ref 135–145)
Total Bilirubin: 0.6 mg/dL (ref 0.2–1.2)
Total Protein: 7.7 g/dL (ref 6.0–8.3)

## 2015-04-27 LAB — MICROALBUMIN / CREATININE URINE RATIO
CREATININE, U: 140.3 mg/dL
MICROALB UR: 6.8 mg/dL — AB (ref 0.0–1.9)
Microalb Creat Ratio: 4.8 mg/g (ref 0.0–30.0)

## 2015-04-27 LAB — HEMOGLOBIN A1C: Hgb A1c MFr Bld: 7.2 % — ABNORMAL HIGH (ref 4.6–6.5)

## 2015-05-02 ENCOUNTER — Other Ambulatory Visit: Payer: Self-pay | Admitting: *Deleted

## 2015-05-02 ENCOUNTER — Ambulatory Visit (INDEPENDENT_AMBULATORY_CARE_PROVIDER_SITE_OTHER): Payer: Commercial Managed Care - HMO | Admitting: Endocrinology

## 2015-05-02 VITALS — BP 134/72 | HR 97 | Temp 98.5°F | Resp 16 | Ht 70.0 in | Wt 192.8 lb

## 2015-05-02 DIAGNOSIS — E782 Mixed hyperlipidemia: Secondary | ICD-10-CM | POA: Diagnosis not present

## 2015-05-02 DIAGNOSIS — E876 Hypokalemia: Secondary | ICD-10-CM | POA: Diagnosis not present

## 2015-05-02 DIAGNOSIS — E1165 Type 2 diabetes mellitus with hyperglycemia: Secondary | ICD-10-CM

## 2015-05-02 DIAGNOSIS — Z794 Long term (current) use of insulin: Secondary | ICD-10-CM

## 2015-05-02 MED ORDER — POTASSIUM CHLORIDE CRYS ER 20 MEQ PO TBCR: EXTENDED_RELEASE_TABLET | ORAL | Status: DC

## 2015-05-02 MED ORDER — INSULIN REGULAR HUMAN 100 UNIT/ML IJ SOLN: INTRAMUSCULAR | Status: DC

## 2015-05-02 MED ORDER — ONETOUCH DELICA LANCETS FINE MISC: Status: DC

## 2015-05-02 MED ORDER — GLUCOSE BLOOD VI STRP: ORAL_STRIP | Status: DC

## 2015-05-02 MED ORDER — "INSULIN SYRINGE 31G X 5/16"" 0.5 ML MISC": Status: AC

## 2015-05-02 NOTE — Progress Notes (Signed)
Patient ID: [name], male   DOB: 06/24/1940, 75 y.o.   MRN: [id number]   Reason for Appointment: Diabetes follow-up   History of Present Illness   Diagnosis: Type 2 DIABETES MELITUS, diagnosis 2003?  PREVIOUS history: He had been initially treated with various oral hypoglycemic drugs including metformin. Because of failure of control he was started on insulin in about 2007 Despite taking basal bolus insulin his A1c had been persistently over 8% and was 9.6% when first seen in consultation With starting Victoza in 12/2011 his blood sugars had improved significantly and A1c had come down to as low as 7% but was back up to 8.4% and 3/14    His blood sugars have been typically difficult to control in the past In 8/15 with starting a regular exercise program his overall control had improved   RECENT history:   Insulin regimen: Levemir 38 units acs   A1c has come down to 7.2 even though his blood sugars still are not consistently controlled as discussed below  He is a poor historian and difficult to know his daily routine, compliance with diet and timing of meals which is variable Not clear which readings in the evenings are before or after meals as he does not label them  Current blood sugar patterns and problems identified   His fasting blood sugars are generally well controlled with only occasional high readings with his taking Levemir in the evenings daily  Usually not checking his blood sugars again until around supper time and these are variably high  Blood sugars are usually significantly high after supper late at night  Although sometimes he will have snacks like ice cream not clear if this is part of his meal or snack in the evening  He has refused to take mealtime coverage for his meals even with blood sugars as high as  324   after eating  His readings around 6-7 PM may be after eating, possibly soon after  He thinks he is compliant with his insulin and  Victoza which is now getting through a patient assistance program  He is reluctant to see the dietitian because of the cost  Non-insulin hypoglycemic drugs: metformin 1500 mg    Victoza  1.2 milligrams daily     Side effects from medications: None       Monitors blood glucose:  2-3 times a day.    Glucometer: One Touch.          Blood Glucose readings from Download:   Mean values apply above for all meters except median for One Touch  PRE-MEAL Fasting  lunch   6-7 PM   bedtime  Overall  Glucose range:  110- 172  128-156 116-227   180- 3 24   Mean/median:  140   160   220   157 +/- 56   Hypoglycemia: none .          Meals: 2-3 meals per day.  For breakfast has eggs grits/toast at 9 , supper around 6-7 PM Physical activity: exercise: Exercise bike for up to 20 minutes about twice a week        Dietician visit: 2013 with diabetes nurse practitioner  Wt Readings from Last 3 Encounters:  05/02/15 192 lb 12.8 oz (87.454 kg)  01/30/15 193 lb 12.8 oz (87.907 kg)  12/13/14 190 lb 6.4 oz (86.365 kg)   Lab Results  Component Value Date   HGBA1C 7.2* 04/27/2015   HGBA1C 7.6* 12/08/2014   HGBA1C 8.6*  07/03/2014   Lab Results  Component Value Date   MICROALBUR 6.8* 04/27/2015   LDLCALC 94 07/03/2014   CREATININE 1.04 04/27/2015   Lab on 04/27/2015  Component Date Value Ref Range Status  . Sodium 04/27/2015 142  135 - 145 mEq/L Final  . Potassium 04/27/2015 3.4* 3.5 - 5.1 mEq/L Final  . Chloride 04/27/2015 103  96 - 112 mEq/L Final  . CO2 04/27/2015 32  19 - 32 mEq/L Final  . Glucose, Bld 04/27/2015 137* 70 - 99 mg/dL Final  . BUN 04/27/2015 7  6 - 23 mg/dL Final  . Creatinine, Ser 04/27/2015 1.04  0.40 - 1.50 mg/dL Final  . Total Bilirubin 04/27/2015 0.6  0.2 - 1.2 mg/dL Final  . Alkaline Phosphatase 04/27/2015 58  39 - 117 U/L Final  . AST 04/27/2015 17  0 - 37 U/L Final  . ALT 04/27/2015 17  0 - 53 U/L Final  . Total Protein 04/27/2015 7.7  6.0 - 8.3 g/dL Final  . Albumin  04/27/2015 3.9  3.5 - 5.2 g/dL Final  . Calcium 04/27/2015 9.6  8.4 - 10.5 mg/dL Final  . GFR 04/27/2015 90.20  >60.00 mL/min Final  . Microalb, Ur 04/27/2015 6.8* 0.0 - 1.9 mg/dL Final  . Creatinine,U 04/27/2015 140.3   Final  . Microalb Creat Ratio 04/27/2015 4.8  0.0 - 30.0 mg/g Final  . Hgb A1c MFr Bld 04/27/2015 7.2* 4.6 - 6.5 % Final   Glycemic Control Guidelines for People with Diabetes:Non Diabetic:  <6%Goal of Therapy: <7%Additional Action Suggested:  >8%        Medication List       This list is accurate as of: 05/02/15  8:48 PM.  Always use your most recent med list.               amLODipine 10 MG tablet  Commonly known as:  NORVASC  Take 10 mg by mouth daily.     atropine 1 % ophthalmic solution     diphenhydrAMINE 25 MG tablet  Commonly known as:  BENADRYL  1 tablet twice a day for 5 days     donepezil 5 MG tablet  Commonly known as:  ARICEPT     dorzolamide-timolol 22.3-6.8 MG/ML ophthalmic solution  Commonly known as:  COSOPT     DUREZOL 0.05 % Emul  Generic drug:  Difluprednate     famotidine 20 MG tablet  Commonly known as:  PEPCID  Take 1 tablet (20 mg total) by mouth daily.     ferrous fumarate 325 (106 Fe) MG Tabs tablet  Commonly known as:  HEMOCYTE - 106 mg FE  Take 1 tablet by mouth 3 (three) times daily.     fish oil-omega-3 fatty acids 1000 MG capsule  Take 2 g by mouth daily.     glucose blood test strip  Commonly known as:  ONETOUCH VERIO  Use as instructed to check blood sugar 3 times per day dx code E11.65     Insulin Detemir 100 UNIT/ML Pen  Commonly known as:  LEVEMIR FLEXTOUCH  Inject 38 units daily     insulin regular 100 units/mL injection  Commonly known as:  NOVOLIN R,HUMULIN R  Inject 8 units daily before dinner     INSULIN SYRINGE .5CC/31GX5/16" 31G X 5/16" 0.5 ML Misc  Use one to inject insulin daily     latanoprost 0.005 % ophthalmic solution  Commonly known as:  XALATAN     Liraglutide 18 MG/3ML Sopn  Commonly known as:  VICTOZA  Inject 1.2 mg into the skin daily.     metFORMIN 1000 MG tablet  Commonly known as:  GLUCOPHAGE  TAKE 1/2 TABLET EACH MORNING AND 1 TABLET AT DINNER     metoprolol succinate 25 MG 24 hr tablet  Commonly known as:  TOPROL-XL  Take 25 mg by mouth daily.     metoprolol tartrate 25 MG tablet  Commonly known as:  LOPRESSOR     ONETOUCH DELICA LANCETS FINE Misc  Use to check blood sugar 3 times per day Dx code E11.65     phenazopyridine 200 MG tablet  Commonly known as:  PYRIDIUM  Take 200 mg by mouth 3 (three) times daily as needed.     potassium chloride SA 20 MEQ tablet  Commonly known as:  K-DUR,KLOR-CON  take 1 tablet daily     prednisoLONE acetate 1 % ophthalmic suspension  Commonly known as:  PRED FORTE     simvastatin 20 MG tablet  Commonly known as:  ZOCOR  Take 20 mg by mouth daily.     tamsulosin 0.4 MG Caps capsule  Commonly known as:  FLOMAX  Take by mouth.     TRAVATAN Z 0.004 % Soln ophthalmic solution  Generic drug:  Travoprost (BAK Free)        Allergies:  Allergies  Allergen Reactions  . Ace Inhibitors Other (See Comments)    angioedema  . Allopurinol Rash  . Aspirin     Unknown reaction     Past Medical History  Diagnosis Date  . Hypertension   . Diabetes mellitus     insulin dependent  . Hypercholesterolemia   . Sarcoidosis ([place])   . Unspecified glaucoma   . Heart disease, unspecified   . Coronary artery disease     Past Surgical History  Procedure Laterality Date  . Knee replacement surgery      right  . Cataract extraction      Family History  Problem Relation Age of Onset  . Cancer Neg Hx   . Hypertension Mother   . Diabetes Mellitus I Mother   . Hypertension Father   . Coronary artery disease Father   . Hypertension Sister   . Hypertension Brother   . Coronary artery disease Brother   . Diabetes Mellitus I Brother   . Diabetes Mellitus I Brother     Social History:  reports that he  quit smoking about 10 years ago. His smoking use included Cigarettes. He smoked 0.50 packs per day. He has never used smokeless tobacco. He reports that he does not drink alcohol or use illicit drugs.  Review of Systems:  HYPERTENSION:   Usually fairly well controlled with Lotrel and metoprolol    HYPOKALEMIA:  He does not appear to be taking any diuretics and not clear why his potassium is low again He ran out of his potassium and has not refilled it Has not discussed this with PCP  Lab Results  Component Value Date   CREATININE 1.04 04/27/2015   BUN 7 04/27/2015   NA 142 04/27/2015   K 3.4* 04/27/2015   CL 103 04/27/2015   CO2 32 04/27/2015     HYPERLIPIDEMIA: The lipid abnormality consists of elevated LDL treated with simvastatin 20 mg only.   Also has low HDL, lipids are followed by PCP and cardiologist   Lab Results  Component Value Date   CHOL 144 07/03/2014   HDL 33.00* 07/03/2014   [place] 94 07/03/2014  TRIG 84.0 07/03/2014   CHOLHDL 4 07/03/2014    Foot exam done in 8/16, normal     Examination:   BP 134/72 mmHg  Pulse 97  Temp(Src) 98.5 F (36.9 C)  Resp 16  Ht 5\' 10"  (1.778 m)  Wt 192 lb 12.8 oz (87.454 kg)  BMI 27.66 kg/m2  SpO2 98%  Body mass index is 27.66 kg/(m^2).    Exam not indicated  ASSESSMENT/ PLAN:   Diabetes type 2  See history of present illness for detailed discussion of his current management, blood sugar patterns and problems identified  The patient's diabetes control is  variable with significantly high postprandial readings although fasting readings are well controlled with basal insulin in the evenings Sometimes does have high readings during the day with most of his readings are high after supper indicating insulin deficiency He claims that his high readings up because of faulty glucose monitor  Glucose monitor was checked for accuracy by nurse educator today He and his wife are usually refusing to consider additional  therapies and insulin because of cost Discussed that he can get generic Regular Insulin at [facility] inexpensively and finally agreed to start this He was also counseled by the diabetes nurse educator on mealtime insulin  He will start with 6 units and adjust the dose based on meal size, most likely needs at least 8 units to keep postprandial readings in target Discussed blood sugar targets at various times also   Needs annual eye exams  HYPOKALEMIA: Potassium is  low again with running out of potassium supplements and will start back again Most likely has renal wasting since he does not take any diuretics or have any acid-base imbalance Prescription sent to his mail order company  Advised him to continue supplements indefinitely  HYPERLIPIDEMIA: needs follow-up levels   Patient Instructions  6-8 units regular before insulin daily and same time as Levemir  Check blood sugars on waking up  2 times a week Also check blood sugars about 2 hours after a meal and do this after different meals by rotation  Recommended blood sugar levels on waking up is 90-130 and about 2 hours after meal is 130-160  Please bring your blood sugar monitor to each visit, thank you      Counseling time on subjects discussed above is over 50% of today's 25 minute visit    Vinita Prentiss 05/02/2015, 8:48 PM

## 2015-05-02 NOTE — Patient Instructions (Addendum)
6-8 units regular before insulin daily and same time as Levemir  Check blood sugars on waking up  2 times a week Also check blood sugars about 2 hours after a meal and do this after different meals by rotation  Recommended blood sugar levels on waking up is 90-130 and about 2 hours after meal is 130-160  Please bring your blood sugar monitor to each visit, thank you

## 2015-05-03 ENCOUNTER — Telehealth: Payer: Self-pay | Admitting: Endocrinology

## 2015-05-11 ENCOUNTER — Other Ambulatory Visit: Payer: Self-pay | Admitting: *Deleted

## 2015-05-11 MED ORDER — GLUCOSE BLOOD VI STRP: ORAL_STRIP | Status: DC

## 2015-05-11 MED ORDER — ACCU-CHEK AVIVA PLUS W/DEVICE KIT: PACK | Status: DC

## 2015-05-11 MED ORDER — ACCU-CHEK FASTCLIX LANCETS MISC: Status: DC

## 2015-05-14 ENCOUNTER — Telehealth: Payer: Self-pay | Admitting: Endocrinology

## 2015-05-16 ENCOUNTER — Other Ambulatory Visit: Payer: Self-pay | Admitting: *Deleted

## 2015-05-18 NOTE — Telephone Encounter (Signed)
error 

## 2015-05-24 ENCOUNTER — Other Ambulatory Visit: Payer: Self-pay | Admitting: *Deleted

## 2015-05-24 MED ORDER — INSULIN DETEMIR 100 UNIT/ML FLEXPEN: PEN_INJECTOR | SUBCUTANEOUS | Status: DC

## 2015-05-24 MED ORDER — LIRAGLUTIDE 18 MG/3ML ~~LOC~~ SOPN
1.2000 mg | PEN_INJECTOR | Freq: Every day | SUBCUTANEOUS | Status: DC
Start: 2015-05-24 — End: 2015-11-02

## 2015-06-07 DIAGNOSIS — H11153 Pinguecula, bilateral: Secondary | ICD-10-CM | POA: Diagnosis not present

## 2015-06-07 DIAGNOSIS — Z961 Presence of intraocular lens: Secondary | ICD-10-CM | POA: Diagnosis not present

## 2015-06-07 DIAGNOSIS — H16143 Punctate keratitis, bilateral: Secondary | ICD-10-CM | POA: Diagnosis not present

## 2015-06-07 DIAGNOSIS — H401113 Primary open-angle glaucoma, right eye, severe stage: Secondary | ICD-10-CM | POA: Diagnosis not present

## 2015-06-07 DIAGNOSIS — H02401 Unspecified ptosis of right eyelid: Secondary | ICD-10-CM | POA: Diagnosis not present

## 2015-06-07 DIAGNOSIS — H18413 Arcus senilis, bilateral: Secondary | ICD-10-CM | POA: Diagnosis not present

## 2015-06-07 DIAGNOSIS — Z9849 Cataract extraction status, unspecified eye: Secondary | ICD-10-CM | POA: Diagnosis not present

## 2015-06-07 DIAGNOSIS — H401122 Primary open-angle glaucoma, left eye, moderate stage: Secondary | ICD-10-CM | POA: Diagnosis not present

## 2015-06-07 DIAGNOSIS — H11423 Conjunctival edema, bilateral: Secondary | ICD-10-CM | POA: Diagnosis not present

## 2015-06-08 ENCOUNTER — Other Ambulatory Visit: Payer: Commercial Managed Care - HMO

## 2015-06-11 ENCOUNTER — Other Ambulatory Visit (INDEPENDENT_AMBULATORY_CARE_PROVIDER_SITE_OTHER): Payer: Commercial Managed Care - HMO

## 2015-06-11 DIAGNOSIS — Z794 Long term (current) use of insulin: Secondary | ICD-10-CM

## 2015-06-11 DIAGNOSIS — E1165 Type 2 diabetes mellitus with hyperglycemia: Secondary | ICD-10-CM | POA: Diagnosis not present

## 2015-06-11 LAB — COMPREHENSIVE METABOLIC PANEL
ALT: 15 U/L (ref 0–53)
AST: 17 U/L (ref 0–37)
Albumin: 4.4 g/dL (ref 3.5–5.2)
Alkaline Phosphatase: 52 U/L (ref 39–117)
BUN: 14 mg/dL (ref 6–23)
CHLORIDE: 106 meq/L (ref 96–112)
CO2: 24 meq/L (ref 19–32)
Calcium: 10.1 mg/dL (ref 8.4–10.5)
Creatinine, Ser: 1.01 mg/dL (ref 0.40–1.50)
GFR: 93.27 mL/min (ref >60.00)
GLUCOSE: 101 mg/dL — AB (ref 70–99)
POTASSIUM: 4 meq/L (ref 3.5–5.1)
Sodium: 140 mEq/L (ref 135–145)
Total Bilirubin: 0.7 mg/dL (ref 0.2–1.2)
Total Protein: 8.3 g/dL (ref 6.0–8.3)

## 2015-06-11 LAB — LIPID PANEL
CHOL/HDL RATIO: 4
Cholesterol: 124 mg/dL (ref 0–200)
HDL: 31.2 mg/dL — AB (ref >39.00)
LDL CALC: 68 mg/dL (ref 0–99)
NONHDL: 92.33
Triglycerides: 121 mg/dL (ref 0.0–149.0)
VLDL: 24.2 mg/dL (ref 0.0–40.0)

## 2015-06-12 LAB — FRUCTOSAMINE: Fructosamine: 299 umol/L — ABNORMAL HIGH (ref 0–285)

## 2015-06-13 ENCOUNTER — Encounter: Payer: Self-pay | Admitting: Endocrinology

## 2015-06-13 ENCOUNTER — Ambulatory Visit (INDEPENDENT_AMBULATORY_CARE_PROVIDER_SITE_OTHER): Payer: Commercial Managed Care - HMO | Admitting: Endocrinology

## 2015-06-13 VITALS — BP 140/76 | HR 84 | Temp 98.2°F | Resp 16 | Ht 70.0 in | Wt 190.0 lb

## 2015-06-13 DIAGNOSIS — Z794 Long term (current) use of insulin: Secondary | ICD-10-CM

## 2015-06-13 DIAGNOSIS — E1165 Type 2 diabetes mellitus with hyperglycemia: Secondary | ICD-10-CM | POA: Diagnosis not present

## 2015-06-13 NOTE — Progress Notes (Signed)
Patient ID: [name], male   DOB: 27-Dec-1940, 75 y.o.   MRN: [id number]   Reason for Appointment: Diabetes follow-up   History of Present Illness   Diagnosis: Type 2 DIABETES MELITUS, diagnosis 2003?  PREVIOUS history: He had been initially treated with various oral hypoglycemic drugs including metformin. Because of failure of control he was started on insulin in about 2007 Despite taking basal bolus insulin his A1c had been persistently over 8% and was 9.6% when first seen in consultation With starting Victoza in 12/2011 his blood sugars had improved significantly and A1c had come down to as low as 7% but was back up to 8.4% and 3/14    His blood sugars have been typically difficult to control in the past In 8/15 with starting a regular exercise program his overall control had improved   RECENT history:   Insulin regimen: Levemir 38 units acs;  regular insulin 10 units at suppertime as needed  A1c has come down to 7.2 as of 1/17 even though his blood sugars were significantly high especially after meals on his last visit He thinks his high readings were related to his old One Touch monitor Currently taking the blood sugars with an Accu-Chek monitor which is new  Not clear which readings in the evenings are before or after meals as he does not label them He is somewhat inconsistent with his history and not clear what his exact management is  Current blood sugar patterns and problems identified   His fasting blood sugars are very well controlled with most readings near 100   He is checking readings in the evenings around 7-8 PM but difficult to know which readings are after meals, also his monitor is 1 hour slow on the time  He is stating that he takes his regular insulin at suppertime only when the blood sugar is high in the morning but not if it's low-normal but yesterday even with fasting of 79 he says he took 10 units and postprandial reading was 111  No symptoms of  hypoglycemia currently  He thinks he is compliant with his insulin and Victoza, has been getting through a patient assistance program  He is reluctant to see the dietitian because of the cost  Non-insulin hypoglycemic drugs: metformin 1500 mg    Victoza  1.2 mg daily     Side effects from medications: None       Monitors blood glucose:  2-3 times a day.    Glucometer:  Accu-Chek  Blood Glucose readings from Download:   Mean values apply above for all meters except median for One Touch  PRE-MEAL Fasting Lunch Dinner Bedtime Overall  Glucose range:  79-116    77-133   111    Mean/median:  106      117    POST-MEAL PC Breakfast PC Lunch PC Dinner  Glucose range:    149-196   Mean/median:        Mean values apply above for all meters except median for One Touch  PRE-MEAL Fasting  lunch   6-7 PM   bedtime  Overall  Glucose range:  110- 172  128-156 116-227   180- 3 24   Mean/median:  140   160   220   157 +/- 56   Hypoglycemia: none .          Meals: 2-3 meals per day.  For breakfast has eggs grits/toast at 9 , supper around 6-7 PM Physical activity: exercise: Exercise bike  for up to 20 minutes about twice a week        Dietician visit: 2013 with diabetes nurse practitioner  Wt Readings from Last 3 Encounters:  06/13/15 190 lb (86.183 kg)  05/02/15 192 lb 12.8 oz (87.454 kg)  01/30/15 193 lb 12.8 oz (87.907 kg)   Lab Results  Component Value Date   HGBA1C 7.2* 04/27/2015   HGBA1C 7.6* 12/08/2014   HGBA1C 8.6* 07/03/2014   Lab Results  Component Value Date   MICROALBUR 6.8* 04/27/2015   LDLCALC 68 06/11/2015   CREATININE 1.01 06/11/2015   Lab on 06/11/2015  Component Date Value Ref Range Status  . Sodium 06/11/2015 140  135 - 145 mEq/L Final  . Potassium 06/11/2015 4.0  3.5 - 5.1 mEq/L Final  . Chloride 06/11/2015 106  96 - 112 mEq/L Final  . CO2 06/11/2015 24  19 - 32 mEq/L Final  . Glucose, Bld 06/11/2015 101* 70 - 99 mg/dL Final  . BUN 06/11/2015 14  6 - 23  mg/dL Final  . Creatinine, Ser 06/11/2015 1.01  0.40 - 1.50 mg/dL Final  . Total Bilirubin 06/11/2015 0.7  0.2 - 1.2 mg/dL Final  . Alkaline Phosphatase 06/11/2015 52  39 - 117 U/L Final  . AST 06/11/2015 17  0 - 37 U/L Final  . ALT 06/11/2015 15  0 - 53 U/L Final  . Total Protein 06/11/2015 8.3  6.0 - 8.3 g/dL Final  . Albumin 06/11/2015 4.4  3.5 - 5.2 g/dL Final  . Calcium 06/11/2015 10.1  8.4 - 10.5 mg/dL Final  . GFR 06/11/2015 93.27  >60.00 mL/min Final  . Fructosamine 06/11/2015 299* 0 - 285 umol/L Final   Comment: Published reference interval for apparently healthy subjects between age 64 and 40 is 61 - 285 umol/L and in a poorly controlled diabetic population is 228 - 563 umol/L with a mean of 396 umol/L.   Marland Kitchen Cholesterol 06/11/2015 124  0 - 200 mg/dL Final   ATP III Classification       Desirable:  < 200 mg/dL               Borderline High:  200 - 239 mg/dL          High:  > = 240 mg/dL  . Triglycerides 06/11/2015 121.0  0.0 - 149.0 mg/dL Final   Normal:  <150 mg/dLBorderline High:  150 - 199 mg/dL  . HDL 06/11/2015 31.20* >39.00 mg/dL Final  . VLDL 06/11/2015 24.2  0.0 - 40.0 mg/dL Final  . LDL Cholesterol 06/11/2015 68  0 - 99 mg/dL Final  . Total CHOL/HDL Ratio 06/11/2015 4   Final                  Men          Women1/2 Average Risk     3.4          3.3Average Risk          5.0          4.42X Average Risk          9.6          7.13X Average Risk          15.0          11.0                      . NonHDL 06/11/2015 92.33   Final   NOTE:  Non-HDL goal should be  30 mg/dL higher than patient's LDL goal (i.e. LDL goal of < 70 mg/dL, would have non-HDL goal of < 100 mg/dL)       Medication List       This list is accurate as of: 06/13/15  1:58 PM.  Always use your most recent med list.               ACCU-CHEK AVIVA PLUS w/Device Kit  Use to check blood sugar 3 times per day dx code E11.65     ACCU-CHEK SOFTCLIX LANCETS lancets  by Other route. Use as instructed      amLODipine 10 MG tablet  Commonly known as:  NORVASC  Take 10 mg by mouth daily.     atropine 1 % ophthalmic solution     diphenhydrAMINE 25 MG tablet  Commonly known as:  BENADRYL  1 tablet twice a day for 5 days     donepezil 5 MG tablet  Commonly known as:  ARICEPT     dorzolamide-timolol 22.3-6.8 MG/ML ophthalmic solution  Commonly known as:  COSOPT     DUREZOL 0.05 % Emul  Generic drug:  Difluprednate     famotidine 20 MG tablet  Commonly known as:  PEPCID  Take 1 tablet (20 mg total) by mouth daily.     ferrous fumarate 325 (106 Fe) MG Tabs tablet  Commonly known as:  HEMOCYTE - 106 mg FE  Take 1 tablet by mouth 3 (three) times daily.     fish oil-omega-3 fatty acids 1000 MG capsule  Take 2 g by mouth daily.     glucose blood test strip  Commonly known as:  ACCU-CHEK AVIVA PLUS  Use as instructed to check blood sugar 3 times per day dx code E11.65     Insulin Detemir 100 UNIT/ML Pen  Commonly known as:  LEVEMIR FLEXTOUCH  Inject 38 units daily     insulin regular 100 units/mL injection  Commonly known as:  NOVOLIN R,HUMULIN R  Inject 8 units daily before dinner     INSULIN SYRINGE .5CC/31GX5/16" 31G X 5/16" 0.5 ML Misc  Use one to inject insulin daily     latanoprost 0.005 % ophthalmic solution  Commonly known as:  XALATAN     Liraglutide 18 MG/3ML Sopn  Commonly known as:  VICTOZA  Inject 0.2 mLs (1.2 mg total) into the skin daily.     metFORMIN 1000 MG tablet  Commonly known as:  GLUCOPHAGE  TAKE 1/2 TABLET EACH MORNING AND 1 TABLET AT DINNER     metoprolol succinate 25 MG 24 hr tablet  Commonly known as:  TOPROL-XL  Take 25 mg by mouth daily.     metoprolol tartrate 25 MG tablet  Commonly known as:  LOPRESSOR     phenazopyridine 200 MG tablet  Commonly known as:  PYRIDIUM  Take 200 mg by mouth 3 (three) times daily as needed.     potassium chloride SA 20 MEQ tablet  Commonly known as:  K-DUR,KLOR-CON  take 1 tablet daily     prednisoLONE  acetate 1 % ophthalmic suspension  Commonly known as:  PRED FORTE     simvastatin 20 MG tablet  Commonly known as:  ZOCOR  Take 20 mg by mouth daily.     tamsulosin 0.4 MG Caps capsule  Commonly known as:  FLOMAX  Take by mouth.     TRAVATAN Z 0.004 % Soln ophthalmic solution  Generic drug:  Travoprost (BAK Free)  Allergies:  Allergies  Allergen Reactions  . Ace Inhibitors Other (See Comments)    angioedema  . Allopurinol Rash  . Aspirin     Unknown reaction     Past Medical History  Diagnosis Date  . Hypertension   . Diabetes mellitus     insulin dependent  . Hypercholesterolemia   . Sarcoidosis ([place])   . Unspecified glaucoma   . Heart disease, unspecified   . Coronary artery disease     Past Surgical History  Procedure Laterality Date  . Knee replacement surgery      right  . Cataract extraction      Family History  Problem Relation Age of Onset  . Cancer Neg Hx   . Hypertension Mother   . Diabetes Mellitus I Mother   . Hypertension Father   . Coronary artery disease Father   . Hypertension Sister   . Hypertension Brother   . Coronary artery disease Brother   . Diabetes Mellitus I Brother   . Diabetes Mellitus I Brother     Social History:  reports that he quit smoking about 10 years ago. His smoking use included Cigarettes. He smoked 0.50 packs per day. He has never used smokeless tobacco. He reports that he does not drink alcohol or use illicit drugs.  Review of Systems:  HYPERTENSION:   Usually fairly well controlled with Lotrel and metoprolol    HYPOKALEMIA:  He does not appear to be taking any diuretics and not clear why his potassium is low periodically Taking potassium supplement now and potassium is better  Lab Results  Component Value Date   CREATININE 1.01 06/11/2015   BUN 14 06/11/2015   NA 140 06/11/2015   K 4.0 06/11/2015   CL 106 06/11/2015   CO2 24 06/11/2015     HYPERLIPIDEMIA: The lipid abnormality consists of  elevated LDL treated with simvastatin 20 mg only.   Also has low HDL, lipids are followed by PCP and cardiologist   Lab Results  Component Value Date   CHOL 124 06/11/2015   HDL 31.20* 06/11/2015   LDLCALC 68 06/11/2015   TRIG 121.0 06/11/2015   CHOLHDL 4 06/11/2015    Foot exam done in 8/16, normal     Examination:   BP 140/76 mmHg  Pulse 84  Temp(Src) 98.2 F (36.8 C)  Resp 16  Ht _0  (1.778 m)  Wt 190 lb (86.183 kg)  BMI 27.26 kg/m2  SpO2 94%  Body mass index is 27.26 kg/(m^2).    Exam not indicated  ASSESSMENT/ PLAN:   Diabetes type 2  See history of present illness for detailed discussion of his current management, blood sugar patterns and problems identified  The patient's diabetes control is appearing better now He has started taking mealtime coverage at suppertime with better control As discussed above is difficult to identify which readings in the evening are postprandial and which are before eating He is taking regular insulin somewhat arbitrarily Discussed that he needs to take it consistently before supper time between 8-10 units depending on his meal size Since fasting readings are sometimes low normal and reduce Levemir to 36 for now  Continue Victoza as long as he can afford it  HYPOKALEMIA: Potassium is normal with supplement and he will stay on this  HYPERLIPIDEMIA: Controlled except for low HDL  Patient Instructions  Levemir 36 units  Regular insulin 8-10 units before supper based on meal size  Check blood sugars on waking up 2  times a  week Also check blood sugars about 2 hours after a meal and do this after different meals by rotation  Recommended blood sugar levels on waking up is 90-130 and about 2 hours after meal is 130-160  Please bring your blood sugar monitor to each visit, thank you      Counseling time on subjects discussed above is over 50% of today's 25 minute visit    Lerin Jech 06/13/2015, 1:58 PM

## 2015-06-13 NOTE — Patient Instructions (Signed)
Levemir 36 units  Regular insulin 8-10 units before supper based on meal size  Check blood sugars on waking up 2  times a week Also check blood sugars about 2 hours after a meal and do this after different meals by rotation  Recommended blood sugar levels on waking up is 90-130 and about 2 hours after meal is 130-160  Please bring your blood sugar monitor to each visit, thank you

## 2015-06-20 DIAGNOSIS — R413 Other amnesia: Secondary | ICD-10-CM | POA: Diagnosis not present

## 2015-06-27 ENCOUNTER — Other Ambulatory Visit: Payer: Self-pay | Admitting: Endocrinology

## 2015-07-11 ENCOUNTER — Ambulatory Visit (INDEPENDENT_AMBULATORY_CARE_PROVIDER_SITE_OTHER): Payer: Commercial Managed Care - HMO | Admitting: Cardiology

## 2015-07-11 ENCOUNTER — Encounter: Payer: Self-pay | Admitting: Cardiology

## 2015-07-11 VITALS — BP 128/72 | HR 85 | Ht 70.0 in | Wt 189.0 lb

## 2015-07-11 DIAGNOSIS — E1121 Type 2 diabetes mellitus with diabetic nephropathy: Secondary | ICD-10-CM

## 2015-07-11 DIAGNOSIS — E785 Hyperlipidemia, unspecified: Secondary | ICD-10-CM | POA: Diagnosis not present

## 2015-07-11 DIAGNOSIS — T783XXS Angioneurotic edema, sequela: Secondary | ICD-10-CM | POA: Diagnosis not present

## 2015-07-11 DIAGNOSIS — I10 Essential (primary) hypertension: Secondary | ICD-10-CM

## 2015-07-11 NOTE — Progress Notes (Signed)
[facility]. [address] [place], [place]  [address] Phone: [phone] Fax:  [phone]  Date:  07/11/2015   ID:  [name], DOB [birth year], MRN [id number]  PCP:  Alan Schwalbe, MD   History of Present Illness: [name] is a 75 y.o. male here for follow-up, prior patient of Dr. Melvern Banker.He also has comorbidities of diabetes, hypertension, chronic kidney disease stage II, iron deficiency anemia.  Previously, old myocardial infarction-CAD listed on medical records. He believes that he had a stress test previously several years ago. He does not recall having a cardiac catheterization, stent placement. Echocardiogram in 2015 thankfully did not show any signs of infarction, normal EF.  He does recall an incident that he had once when he was sitting at a table and became "blank". Did not fall over or lose consciousness. He has not had any episodes since.  During his prior visit with me, ACE inhibitor was stopped because of angioedema. He's been doing very well, no chest pain, no shortness of breath, no syncope. No bleeding.  Wt Readings from Last 3 Encounters:  07/11/15 189 lb (85.73 kg)  06/13/15 190 lb (86.183 kg)  05/02/15 192 lb 12.8 oz (87.454 kg)     Past Medical History  Diagnosis Date  . Hypertension   . Diabetes mellitus     insulin dependent  . Hypercholesterolemia   . Sarcoidosis ([place])   . Unspecified glaucoma   . Heart disease, unspecified     Past Surgical History  Procedure Laterality Date  . Knee replacement surgery      right  . Cataract extraction      Current Outpatient Prescriptions  Medication Sig Dispense Refill  . ACCU-CHEK SOFTCLIX LANCETS lancets by Other route. Use as instructed    . amLODipine (NORVASC) 10 MG tablet Take 10 mg by mouth daily.    Marland Kitchen aspirin 81 MG tablet Take 81 mg by mouth daily.    . Blood Glucose Monitoring Suppl (ACCU-CHEK AVIVA PLUS) w/Device KIT Use to check blood sugar 3 times per day dx code E11.65 1 kit 0    . donepezil (ARICEPT) 5 MG tablet Take 5 mg by mouth at bedtime.     . dorzolamide-timolol (COSOPT) 22.3-6.8 MG/ML ophthalmic solution 1 drop as directed.     . ferrous fumarate (HEMOCYTE - 106 MG FE) 325 (106 FE) MG TABS Take 1 tablet by mouth 3 (three) times daily.    . fish oil-omega-3 fatty acids 1000 MG capsule Take 2 g by mouth daily.      Marland Kitchen glucose blood (ACCU-CHEK AVIVA PLUS) test strip Use as instructed to check blood sugar 3 times per day dx code E11.65 300 each 1  . hydroxypropyl methylcellulose / hypromellose (ISOPTO TEARS / GONIOVISC) 2.5 % ophthalmic solution Place 1 drop into both eyes as directed.    . Insulin Detemir (LEVEMIR FLEXTOUCH) 100 UNIT/ML Pen Inject 38 units daily 20 pen 1  . insulin regular (NOVOLIN R,HUMULIN R) 100 units/mL injection Inject 8 units daily before dinner 10 mL 3  . Insulin Syringe-Needle U-100 (INSULIN SYRINGE .5CC/31GX5/16") 31G X 5/16" 0.5 ML MISC Use one to inject insulin daily 30 each 1  . latanoprost (XALATAN) 0.005 % ophthalmic solution     . Liraglutide (VICTOZA) 18 MG/3ML SOPN Inject 0.2 mLs (1.2 mg total) into the skin daily. 15 pen 1  . metFORMIN (GLUCOPHAGE) 1000 MG tablet TAKE 1/2 TABLET EVERY MORNING  AND TAKE 1 TABLET  AT  [facility]  135 tablet 1  . metoprolol tartrate (LOPRESSOR) 25 MG tablet Take 25 mg by mouth 2 (two) times daily.     . potassium chloride SA (K-DUR,KLOR-CON) 20 MEQ tablet take 1 tablet daily 90 tablet 1  . simvastatin (ZOCOR) 20 MG tablet Take 20 mg by mouth daily.    . Tamsulosin HCl (FLOMAX) 0.4 MG CAPS Take by mouth.       No current facility-administered medications for this visit.    Allergies:    Allergies  Allergen Reactions  . Ace Inhibitors Other (See Comments)    angioedema  . Allopurinol Rash  . Aspirin     Unknown reaction     Social History:  The patient  reports that he quit smoking about 10 years ago. His smoking use included Cigarettes. He smoked 0.50 packs per day. He has never used smokeless  tobacco. He reports that he does not drink alcohol or use illicit drugs.   Family History  Problem Relation Age of Onset  . Cancer Neg Hx   . Hypertension Mother   . Diabetes Mellitus I Mother   . Hypertension Father   . Coronary artery disease Father   . Hypertension Sister   . Hypertension Brother   . Coronary artery disease Brother   . Diabetes Mellitus I Brother   . Diabetes Mellitus I Brother    brother died with coronary artery disease, lung cancer. He has 7 total siblings.  ROS:  Please see the history of present illness.   Denies any fevers, chills, orthopnea, PND, rash, headaches, stroke like symptoms, bleeding.   All other systems reviewed and negative.   PHYSICAL EXAM: VS:  BP 128/72 mmHg  Pulse 85  Ht 5' 10"  (1.778 m)  Wt 189 lb (85.73 kg)  BMI 27.12 kg/m2 Well nourished, well developed, in no acute distress HEENT: Significantly increased upper and lower lip swelling, [place]/AT, EOMI Neck: no JVD, normal carotid upstroke, no bruit Cardiac:  normal S1, S2; RRR; no murmur Lungs:  clear to auscultation bilaterally, no wheezing, rhonchi or rales Abd: soft, nontender, no hepatomegaly, no bruitsMildly protuberant Ext: no edema, 2+ distal pulses Skin: warm and dry GU: deferred Neuro: no focal abnormalities noted, AAO x 3  EKG:  EKG was ordered today 07/11/15-sinus rhythm, 85, nonspecific ST-T wave change personally viewed-no specific change from prior Sinus rhythm, 90, nonspecific ST-T wave changes, LVH borderline Labs: Glucose 113, creatinine 1.08, potassium 3.9, sodium 140, hemoglobin 14.2, platelets 174, uric acid 9.1 mildly elevated, hemoglobin A1c 6.9, LDL 83   ECHO: 02/06/14: - Left ventricle: The cavity size was normal. There was mild focal basal hypertrophy of the septum. Systolic function was normal. The estimated ejection fraction was in the range of 60% to 65%. Wall motion was normal; there were no regional wall motion abnormalities. Doppler parameters  are consistent with abnormal left ventricular relaxation (grade 1 diastolic dysfunction). - Mitral valve: There was systolic anterior motion of the chordal structures.  ASSESSMENT AND PLAN:  1. Angioedema-previously noted during a clinic encounter. He should not be prescribed an ACE inhibitor in the future nor should he be prescribed angiotensin receptor blocker.  2. I have taken coronary artery disease off of his past medical history-has a history of this is been carried on his past medical history. He he believes that he is had a stress test in the past but does not recall having a cardiac catheterization. Echocardiogram was reassuring. I looked through old [place] records and do not see a Dr.  Gamble visit.  3. Hypertension-currently well controlled.  4. Hyperlipidemia-continue with statin therapy.  5. Diabetes with chronic kidney disease stage II- Well controlled. Dr. Dema Severin. 6. At this point, we will have as needed follow-up. Please let us know and we can be of further assistance. Continue with excellent prevention strategies.  Signed, Candee Furbish, MD [facility]  07/11/2015 11:41 AM

## 2015-07-11 NOTE — Patient Instructions (Signed)
Your physician recommends that you continue on your current medications as directed. Please refer to the Current Medication list given to you today. Your physician recommends that you schedule a follow-up appointment in: as needed with Dr. Marlou Porch.

## 2015-09-04 DIAGNOSIS — H02401 Unspecified ptosis of right eyelid: Secondary | ICD-10-CM | POA: Diagnosis not present

## 2015-09-04 DIAGNOSIS — H11153 Pinguecula, bilateral: Secondary | ICD-10-CM | POA: Diagnosis not present

## 2015-09-04 DIAGNOSIS — H16143 Punctate keratitis, bilateral: Secondary | ICD-10-CM | POA: Diagnosis not present

## 2015-09-04 DIAGNOSIS — Z9849 Cataract extraction status, unspecified eye: Secondary | ICD-10-CM | POA: Diagnosis not present

## 2015-09-04 DIAGNOSIS — H11423 Conjunctival edema, bilateral: Secondary | ICD-10-CM | POA: Diagnosis not present

## 2015-09-04 DIAGNOSIS — H04123 Dry eye syndrome of bilateral lacrimal glands: Secondary | ICD-10-CM | POA: Diagnosis not present

## 2015-09-04 DIAGNOSIS — H401113 Primary open-angle glaucoma, right eye, severe stage: Secondary | ICD-10-CM | POA: Diagnosis not present

## 2015-09-04 DIAGNOSIS — H401122 Primary open-angle glaucoma, left eye, moderate stage: Secondary | ICD-10-CM | POA: Diagnosis not present

## 2015-09-04 DIAGNOSIS — Z961 Presence of intraocular lens: Secondary | ICD-10-CM | POA: Diagnosis not present

## 2015-09-10 ENCOUNTER — Other Ambulatory Visit (INDEPENDENT_AMBULATORY_CARE_PROVIDER_SITE_OTHER): Payer: Commercial Managed Care - HMO

## 2015-09-10 DIAGNOSIS — E1165 Type 2 diabetes mellitus with hyperglycemia: Secondary | ICD-10-CM | POA: Diagnosis not present

## 2015-09-10 DIAGNOSIS — Z794 Long term (current) use of insulin: Secondary | ICD-10-CM

## 2015-09-10 LAB — COMPREHENSIVE METABOLIC PANEL
ALT: 16 U/L (ref 0–53)
AST: 18 U/L (ref 0–37)
Albumin: 4 g/dL (ref 3.5–5.2)
Alkaline Phosphatase: 45 U/L (ref 39–117)
BILIRUBIN TOTAL: 0.7 mg/dL (ref 0.2–1.2)
BUN: 14 mg/dL (ref 6–23)
CHLORIDE: 107 meq/L (ref 96–112)
CO2: 27 meq/L (ref 19–32)
CREATININE: 1.11 mg/dL (ref 0.40–1.50)
Calcium: 9.4 mg/dL (ref 8.4–10.5)
GFR: 83.58 mL/min (ref >60.00)
GLUCOSE: 118 mg/dL — AB (ref 70–99)
Potassium: 3.7 mEq/L (ref 3.5–5.1)
SODIUM: 141 meq/L (ref 135–145)
Total Protein: 7.4 g/dL (ref 6.0–8.3)

## 2015-09-10 LAB — MICROALBUMIN / CREATININE URINE RATIO
Creatinine,U: 183.7 mg/dL
Microalb Creat Ratio: 1 mg/g (ref 0.0–30.0)
Microalb, Ur: 1.8 mg/dL (ref 0.0–1.9)

## 2015-09-10 LAB — HEMOGLOBIN A1C: Hgb A1c MFr Bld: 7.7 % — ABNORMAL HIGH (ref 4.6–6.5)

## 2015-09-14 ENCOUNTER — Encounter: Payer: Self-pay | Admitting: Endocrinology

## 2015-09-14 ENCOUNTER — Ambulatory Visit (INDEPENDENT_AMBULATORY_CARE_PROVIDER_SITE_OTHER): Payer: Commercial Managed Care - HMO | Admitting: Endocrinology

## 2015-09-14 VITALS — BP 118/64 | HR 86 | Temp 97.5°F | Resp 12 | Wt 193.0 lb

## 2015-09-14 DIAGNOSIS — Z794 Long term (current) use of insulin: Secondary | ICD-10-CM

## 2015-09-14 DIAGNOSIS — E1165 Type 2 diabetes mellitus with hyperglycemia: Secondary | ICD-10-CM | POA: Diagnosis not present

## 2015-09-14 NOTE — Progress Notes (Signed)
Patient ID: [name], male   DOB: 02-06-1941, 75 y.o.   MRN: [id number]   Reason for Appointment: Diabetes follow-up   History of Present Illness   Diagnosis: Type 2 DIABETES MELITUS, diagnosis 2003?  PREVIOUS history: He had been initially treated with various oral hypoglycemic drugs including metformin. Because of failure of control he was started on insulin in about 2007 Despite taking basal bolus insulin his A1c had been persistently over 8% and was 9.6% when first seen in consultation With starting Victoza in 12/2011 his blood sugars had improved significantly and A1c had come down to as low as 7% but was back up to 8.4% and 3/14    His blood sugars have been typically difficult to control in the past In 8/15 with starting a regular exercise program his overall control had improved   RECENT history:   Insulin regimen: Levemir 36 units acs;    Novolin R 12 units at suppertime   His A1c has gone up to 7.7 compared to 7.2  Current blood sugar patterns and problems identified   His fasting blood sugars are very well controlled with fairly consistent readings even with reducing his Levemir by 2 units on the last visit  He has done most readings in the evenings around suppertime, usually before eating abuse are only occasionally higher  He has only a few readings late at night around bedtime and these are mostly high  He thinks he does have some snacks late in the evening  He is fairly compliant with taking his regular insulin at suppertime but not always 15-30 minutes before eating  No symptoms of hypoglycemia   He has gained weight since his last visit  He thinks he is compliant with his insulin and Victoza 1.2 mg, has been getting through a patient assistance program  He is reluctant to see the dietitian because of the cost  Non-insulin hypoglycemic drugs: metformin 1500 mg    Victoza  1.2 mg daily     Side effects from medications: None       Monitors blood  glucose:  2-3 times a day.    Glucometer:  Accu-Chek  Blood Glucose readings from Download:   Mean values apply above for all meters except median for One Touch  PRE-MEAL Fasting 2-4 PM  Dinner Bedtime Overall  Glucose range: 77-154  85-123  95-197  148-272    Mean/median: 116   147   129     Hypoglycemia: none .          Meals: 2-3 meals per day.  For breakfast has eggs grits/toast at 9 , lunch 2 pm, supper around 6-7 PM Physical activity: exercise:None recently, previously using exercise bike  Dietician visit: 2013 with diabetes nurse practitioner  Wt Readings from Last 3 Encounters:  09/14/15 193 lb (87.544 kg)  07/11/15 189 lb (85.73 kg)  06/13/15 190 lb (86.183 kg)   Lab Results  Component Value Date   HGBA1C 7.7* 09/10/2015   HGBA1C 7.2* 04/27/2015   HGBA1C 7.6* 12/08/2014   Lab Results  Component Value Date   MICROALBUR 1.8 09/10/2015   LDLCALC 68 06/11/2015   CREATININE 1.11 09/10/2015   Lab on 09/10/2015  Component Date Value Ref Range Status  . Hgb A1c MFr Bld 09/10/2015 7.7* 4.6 - 6.5 % Final   Glycemic Control Guidelines for People with Diabetes:Non Diabetic:  <6%Goal of Therapy: <7%Additional Action Suggested:  >8%   . Sodium 09/10/2015 141  135 - 145 mEq/L Final  .  Potassium 09/10/2015 3.7  3.5 - 5.1 mEq/L Final  . Chloride 09/10/2015 107  96 - 112 mEq/L Final  . CO2 09/10/2015 27  19 - 32 mEq/L Final  . Glucose, Bld 09/10/2015 118* 70 - 99 mg/dL Final  . BUN 09/10/2015 14  6 - 23 mg/dL Final  . Creatinine, Ser 09/10/2015 1.11  0.40 - 1.50 mg/dL Final  . Total Bilirubin 09/10/2015 0.7  0.2 - 1.2 mg/dL Final  . Alkaline Phosphatase 09/10/2015 45  39 - 117 U/L Final  . AST 09/10/2015 18  0 - 37 U/L Final  . ALT 09/10/2015 16  0 - 53 U/L Final  . Total Protein 09/10/2015 7.4  6.0 - 8.3 g/dL Final  . Albumin 09/10/2015 4.0  3.5 - 5.2 g/dL Final  . Calcium 09/10/2015 9.4  8.4 - 10.5 mg/dL Final  . GFR 09/10/2015 83.58  >60.00 mL/min Final  . Microalb, Ur  09/10/2015 1.8  0.0 - 1.9 mg/dL Final  . Creatinine,U 09/10/2015 183.7   Final  . Microalb Creat Ratio 09/10/2015 1.0  0.0 - 30.0 mg/g Final       Medication List       This list is accurate as of: 09/14/15 12:09 PM.  Always use your most recent med list.               ACCU-CHEK AVIVA PLUS w/Device Kit  Use to check blood sugar 3 times per day dx code E11.65     ACCU-CHEK SOFTCLIX LANCETS lancets  by Other route. Use as instructed     amLODipine 10 MG tablet  Commonly known as:  NORVASC  Take 10 mg by mouth daily.     aspirin 81 MG tablet  Take 81 mg by mouth daily.     donepezil 5 MG tablet  Commonly known as:  ARICEPT  Take 5 mg by mouth at bedtime.     dorzolamide-timolol 22.3-6.8 MG/ML ophthalmic solution  Commonly known as:  COSOPT  1 drop as directed.     ferrous fumarate 325 (106 Fe) MG Tabs tablet  Commonly known as:  HEMOCYTE - 106 mg FE  Take 1 tablet by mouth 3 (three) times daily.     fish oil-omega-3 fatty acids 1000 MG capsule  Take 2 g by mouth daily.     glucose blood test strip  Commonly known as:  ACCU-CHEK AVIVA PLUS  Use as instructed to check blood sugar 3 times per day dx code E11.65     hydroxypropyl methylcellulose / hypromellose 2.5 % ophthalmic solution  Commonly known as:  ISOPTO TEARS / GONIOVISC  Place 1 drop into both eyes as directed.     Insulin Detemir 100 UNIT/ML Pen  Commonly known as:  LEVEMIR FLEXTOUCH  Inject 38 units daily     insulin regular 100 units/mL injection  Commonly known as:  NOVOLIN R,HUMULIN R  Inject 8 units daily before dinner     INSULIN SYRINGE .5CC/31GX5/16" 31G X 5/16" 0.5 ML Misc  Use one to inject insulin daily     latanoprost 0.005 % ophthalmic solution  Commonly known as:  XALATAN     Liraglutide 18 MG/3ML Sopn  Commonly known as:  VICTOZA  Inject 0.2 mLs (1.2 mg total) into the skin daily.     metFORMIN 1000 MG tablet  Commonly known as:  GLUCOPHAGE  TAKE 1/2 TABLET EVERY MORNING  AND  TAKE 1 TABLET  AT  DINNER     metoprolol tartrate 25 MG tablet  Commonly known as:  LOPRESSOR  Take 25 mg by mouth 2 (two) times daily.     potassium chloride SA 20 MEQ tablet  Commonly known as:  K-DUR,KLOR-CON  take 1 tablet daily     simvastatin 20 MG tablet  Commonly known as:  ZOCOR  Take 20 mg by mouth daily.     tamsulosin 0.4 MG Caps capsule  Commonly known as:  FLOMAX  Take by mouth.        Allergies:  Allergies  Allergen Reactions  . Ace Inhibitors Other (See Comments)    angioedema  . Allopurinol Rash  . Aspirin     Unknown reaction     Past Medical History  Diagnosis Date  . Hypertension   . Diabetes mellitus     insulin dependent  . Hypercholesterolemia   . Sarcoidosis ([place])   . Unspecified glaucoma   . Heart disease, unspecified     Past Surgical History  Procedure Laterality Date  . Knee replacement surgery      right  . Cataract extraction      Family History  Problem Relation Age of Onset  . Cancer Neg Hx   . Hypertension Mother   . Diabetes Mellitus I Mother   . Hypertension Father   . Coronary artery disease Father   . Hypertension Sister   . Hypertension Brother   . Coronary artery disease Brother   . Diabetes Mellitus I Brother   . Diabetes Mellitus I Brother     Social History:  reports that he quit smoking about 10 years ago. His smoking use included Cigarettes. He smoked 0.50 packs per day. He has never used smokeless tobacco. He reports that he does not drink alcohol or use illicit drugs.  Review of Systems:  HYPERTENSION:   Usually fairly well controlled with Lotrel and metoprolol    HYPOKALEMIA:  He does not appear to be taking any diuretics and not clear why his potassium is low periodically Taking potassium supplement now and potassium is better  Lab Results  Component Value Date   CREATININE 1.11 09/10/2015   BUN 14 09/10/2015   NA 141 09/10/2015   K 3.7 09/10/2015   CL 107 09/10/2015   CO2 27 09/10/2015      HYPERLIPIDEMIA: The lipid abnormality consists of elevated LDL treated with simvastatin 20 mg only.   Also has low HDL, lipids are followed by PCP and cardiologist   Lab Results  Component Value Date   CHOL 124 06/11/2015   HDL 31.20* 06/11/2015   LDLCALC 68 06/11/2015   TRIG 121.0 06/11/2015   CHOLHDL 4 06/11/2015    Foot exam done in 8/16, normal     Examination:   BP 118/64 mmHg  Pulse 86  Temp(Src) 97.5 F (36.4 C) (Oral)  Resp 12  Wt 193 lb (87.544 kg)  SpO2 95%  Body mass index is 27.69 kg/(m^2).     ASSESSMENT/ PLAN:   Diabetes type 2  See history of present illness for detailed discussion of his current management, blood sugar patterns and problems identified  The patient's diabetes control is Inconsistent and A1c is higher at 7.7 line most of his high readings are probably after meals as his fasting readings are fairly consistent Does not usually check blood sugars after breakfast or lunch either. He has taken 12 units of regular insulin at suppertime Also taking Victoza, he thinks he is taking 1.2 although he was unclear about his dosage initially  For now will continue  the same regimen including basal insulin However discussed that he probably needs to take 14 units of regular insulin at suppertime if eating or carbohydrates or larger meals, discussed timing of insulin for regular insulin to be about 30 minutes before eating  He will be needing to check more readings 2 hours after eating rather than sporadically at bedtime Discussed blood sugar targets He can check blood sugars in the mornings less often  Also encouraged him to start walking for exercise or use exercise bike regularly  Patient Instructions  Regular insulin 30 min before supper, take 12 or 14 units based on meal size  Check blood sugars on waking up  3 times a week  Also check blood sugars about 2 hours after a meal and do this after different meals by rotation ESPECIAlLY  SUPPER  Recommended blood sugar levels on waking up is 90-130 and about 2 hours after meal is 130-160  Please bring your blood sugar monitor to each visit, thank you  Walk daily    Counseling time on subjects discussed above is over 50% of today's 25 minute visit     Zamyra Allensworth 09/14/2015, 12:09 PM

## 2015-09-14 NOTE — Patient Instructions (Addendum)
Regular insulin 30 min before supper, take 12 or 14 units based on meal size  Check blood sugars on waking up  3 times a week  Also check blood sugars about 2 hours after a meal and do this after different meals by rotation ESPECIAlLY SUPPER  Recommended blood sugar levels on waking up is 90-130 and about 2 hours after meal is 130-160  Please bring your blood sugar monitor to each visit, thank you  Walk daily

## 2015-09-20 DIAGNOSIS — I10 Essential (primary) hypertension: Secondary | ICD-10-CM | POA: Diagnosis not present

## 2015-09-20 DIAGNOSIS — R413 Other amnesia: Secondary | ICD-10-CM | POA: Diagnosis not present

## 2015-09-20 DIAGNOSIS — E785 Hyperlipidemia, unspecified: Secondary | ICD-10-CM | POA: Diagnosis not present

## 2015-11-02 ENCOUNTER — Telehealth: Payer: Self-pay | Admitting: Endocrinology

## 2015-11-02 ENCOUNTER — Other Ambulatory Visit: Payer: Self-pay

## 2015-11-02 MED ORDER — INSULIN DETEMIR 100 UNIT/ML FLEXPEN: PEN_INJECTOR | SUBCUTANEOUS | Status: DC

## 2015-11-02 MED ORDER — LIRAGLUTIDE 18 MG/3ML ~~LOC~~ SOPN: 1.2000 mg | PEN_INJECTOR | Freq: Every day | SUBCUTANEOUS | Status: DC

## 2015-11-02 NOTE — Telephone Encounter (Signed)
PT needs Victoza and Levemir called into [facility].

## 2015-11-02 NOTE — Telephone Encounter (Signed)
Rx sent electronically.  

## 2015-11-05 DIAGNOSIS — C61 Malignant neoplasm of prostate: Secondary | ICD-10-CM | POA: Diagnosis not present

## 2015-11-05 DIAGNOSIS — C678 Malignant neoplasm of overlapping sites of bladder: Secondary | ICD-10-CM | POA: Diagnosis not present

## 2015-11-07 ENCOUNTER — Other Ambulatory Visit: Payer: Self-pay | Admitting: Endocrinology

## 2015-11-07 NOTE — Telephone Encounter (Signed)
Metformin rx refill sent into patient pharmacy.

## 2015-11-29 DIAGNOSIS — F320 Major depressive disorder, single episode, mild: Secondary | ICD-10-CM | POA: Diagnosis not present

## 2015-12-04 DIAGNOSIS — H401122 Primary open-angle glaucoma, left eye, moderate stage: Secondary | ICD-10-CM | POA: Diagnosis not present

## 2015-12-04 DIAGNOSIS — H353131 Nonexudative age-related macular degeneration, bilateral, early dry stage: Secondary | ICD-10-CM | POA: Diagnosis not present

## 2015-12-04 DIAGNOSIS — H35363 Drusen (degenerative) of macula, bilateral: Secondary | ICD-10-CM | POA: Diagnosis not present

## 2015-12-04 DIAGNOSIS — H18413 Arcus senilis, bilateral: Secondary | ICD-10-CM | POA: Diagnosis not present

## 2015-12-04 DIAGNOSIS — H11423 Conjunctival edema, bilateral: Secondary | ICD-10-CM | POA: Diagnosis not present

## 2015-12-04 DIAGNOSIS — H11153 Pinguecula, bilateral: Secondary | ICD-10-CM | POA: Diagnosis not present

## 2015-12-04 DIAGNOSIS — H401113 Primary open-angle glaucoma, right eye, severe stage: Secondary | ICD-10-CM | POA: Diagnosis not present

## 2015-12-04 DIAGNOSIS — H47233 Glaucomatous optic atrophy, bilateral: Secondary | ICD-10-CM | POA: Diagnosis not present

## 2015-12-04 DIAGNOSIS — Z961 Presence of intraocular lens: Secondary | ICD-10-CM | POA: Diagnosis not present

## 2015-12-11 ENCOUNTER — Other Ambulatory Visit (INDEPENDENT_AMBULATORY_CARE_PROVIDER_SITE_OTHER): Payer: Commercial Managed Care - HMO

## 2015-12-11 DIAGNOSIS — E1165 Type 2 diabetes mellitus with hyperglycemia: Secondary | ICD-10-CM

## 2015-12-11 DIAGNOSIS — Z794 Long term (current) use of insulin: Secondary | ICD-10-CM | POA: Diagnosis not present

## 2015-12-11 LAB — BASIC METABOLIC PANEL
BUN: 16 mg/dL (ref 6–23)
CHLORIDE: 105 meq/L (ref 96–112)
CO2: 27 meq/L (ref 19–32)
CREATININE: 1.22 mg/dL (ref 0.40–1.50)
Calcium: 9.4 mg/dL (ref 8.4–10.5)
GFR: 74.89 mL/min (ref >60.00)
GLUCOSE: 132 mg/dL — AB (ref 70–99)
POTASSIUM: 4 meq/L (ref 3.5–5.1)
Sodium: 139 mEq/L (ref 135–145)

## 2015-12-11 LAB — HEMOGLOBIN A1C: Hgb A1c MFr Bld: 8.6 % — ABNORMAL HIGH (ref 4.6–6.5)

## 2015-12-14 ENCOUNTER — Ambulatory Visit (INDEPENDENT_AMBULATORY_CARE_PROVIDER_SITE_OTHER): Payer: Commercial Managed Care - HMO | Admitting: Endocrinology

## 2015-12-14 ENCOUNTER — Encounter: Payer: Self-pay | Admitting: Endocrinology

## 2015-12-14 VITALS — BP 128/68 | HR 96 | Ht 69.0 in | Wt 186.0 lb

## 2015-12-14 DIAGNOSIS — E1165 Type 2 diabetes mellitus with hyperglycemia: Secondary | ICD-10-CM | POA: Diagnosis not present

## 2015-12-14 DIAGNOSIS — E782 Mixed hyperlipidemia: Secondary | ICD-10-CM | POA: Diagnosis not present

## 2015-12-14 DIAGNOSIS — I10 Essential (primary) hypertension: Secondary | ICD-10-CM | POA: Diagnosis not present

## 2015-12-14 DIAGNOSIS — Z794 Long term (current) use of insulin: Secondary | ICD-10-CM | POA: Diagnosis not present

## 2015-12-14 NOTE — Progress Notes (Signed)
Patient ID: [name], male   DOB: Mar 27, 1941, 75 y.o.   MRN: [id number]   Reason for Appointment: Diabetes follow-up   History of Present Illness   Diagnosis: Type 2 DIABETES MELITUS, diagnosis 2003?  PREVIOUS history: He had been initially treated with various oral hypoglycemic drugs including metformin. Because of failure of control he was started on insulin in about 2007 Despite taking basal bolus insulin his A1c had been persistently over 8% and was 9.6% when first seen in consultation With starting Victoza in 12/2011 his blood sugars had improved significantly and A1c had come down to as low as 7% but was back up to 8.4% and 3/14    His blood sugars have been typically difficult to control in the past In 8/15 with starting a regular exercise program his overall control had improved   RECENT history:   Insulin regimen: Levemir 36 units acs  Non-insulin hypoglycemic drugs: metformin 1500 mg    Victoza  1.2 mg daily  His A1c has gone up to 8.6 and has been progressively higher, previously as low as 7.2  Current blood sugar patterns and problems identified   His fasting blood sugars are quite variable although fairly good on an average  He is checking his blood sugars only at breakfast and suppertime and only rarely after supper  He still does not understand the need to take mealtime coverage even when he is eating carbohydrate like a sandwich at lunch.  He does have a prescription for Novolin regular insulin  He thinks he has been regular with his Victoza and no difficulties getting this now  Hypoglycemia: none, Lowest glucose 72  Not motivated to exercise much  He has lost weight since his last visit  He thinks he is compliant with his insulin and Victoza 1.2 mg, has been getting through a patient assistance program  He is reluctant to see the dietitian because of the cost, diet has been variable      Side effects from medications: None       Monitors blood  glucose:  2-3 times a day.    Glucometer:  Accu-Chek  Blood Glucose readings from Download:   Mean values apply above for all meters except median for One Touch  PRE-MEAL Fasting Lunch Dinner Bedtime Overall  Glucose range: 79-174   84-143  122-227    Mean/median: 110   122   116    .          Meals: 2-3 meals per day.  For breakfast has eggs grits/toast at 9, lunch 2 pm, supper around 6-7 PM Physical activity: exercise:None recently, previously using exercise bike  Dietician visit: 2013 with diabetes nurse practitioner  Wt Readings from Last 3 Encounters:  12/14/15 186 lb (84.4 kg)  09/14/15 193 lb (87.5 kg)  07/11/15 189 lb (85.7 kg)   Lab Results  Component Value Date   HGBA1C 8.6 (H) 12/11/2015   HGBA1C 7.7 (H) 09/10/2015   HGBA1C 7.2 (H) 04/27/2015   Lab Results  Component Value Date   MICROALBUR 1.8 09/10/2015   LDLCALC 68 06/11/2015   CREATININE 1.22 12/11/2015   Lab on 12/11/2015  Component Date Value Ref Range Status  . Hgb A1c MFr Bld 12/11/2015 8.6* 4.6 - 6.5 % Final  . Sodium 12/11/2015 139  135 - 145 mEq/L Final  . Potassium 12/11/2015 4.0  3.5 - 5.1 mEq/L Final  . Chloride 12/11/2015 105  96 - 112 mEq/L Final  . CO2 12/11/2015 27  19 - 32 mEq/L Final  . Glucose, Bld 12/11/2015 132* 70 - 99 mg/dL Final  . BUN 12/11/2015 16  6 - 23 mg/dL Final  . Creatinine, Ser 12/11/2015 1.22  0.40 - 1.50 mg/dL Final  . Calcium 12/11/2015 9.4  8.4 - 10.5 mg/dL Final  . GFR 12/11/2015 74.89  >60.00 mL/min Final       Medication List       Accurate as of 12/14/15 11:59 PM. Always use your most recent med list.          ACCU-CHEK AVIVA PLUS w/Device Kit Use to check blood sugar 3 times per day dx code E11.65   ACCU-CHEK SOFTCLIX LANCETS lancets by Other route. Use as instructed   amLODipine 10 MG tablet Commonly known as:  NORVASC Take 10 mg by mouth daily.   aspirin 81 MG tablet Take 81 mg by mouth daily.   donepezil 5 MG tablet Commonly known as:   ARICEPT Take 5 mg by mouth at bedtime.   dorzolamide-timolol 22.3-6.8 MG/ML ophthalmic solution Commonly known as:  COSOPT 1 drop as directed.   ferrous fumarate 325 (106 Fe) MG Tabs tablet Commonly known as:  HEMOCYTE - 106 mg FE Take 1 tablet by mouth 3 (three) times daily.   fish oil-omega-3 fatty acids 1000 MG capsule Take 2 g by mouth daily.   glucose blood test strip Commonly known as:  ACCU-CHEK AVIVA PLUS Use as instructed to check blood sugar 3 times per day dx code E11.65   hydroxypropyl methylcellulose / hypromellose 2.5 % ophthalmic solution Commonly known as:  ISOPTO TEARS / GONIOVISC Place 1 drop into both eyes as directed.   Insulin Detemir 100 UNIT/ML Pen Commonly known as:  LEVEMIR FLEXTOUCH Inject 38 units daily   insulin regular 100 units/mL injection Commonly known as:  NOVOLIN R,HUMULIN R Inject 8 units daily before dinner   INSULIN SYRINGE .5CC/31GX5/16" 31G X 5/16" 0.5 ML Misc Use one to inject insulin daily   latanoprost 0.005 % ophthalmic solution Commonly known as:  XALATAN   Liraglutide 18 MG/3ML Sopn Commonly known as:  VICTOZA Inject 0.2 mLs (1.2 mg total) into the skin daily.   metFORMIN 1000 MG tablet Commonly known as:  GLUCOPHAGE TAKE 1/2 TABLET EACH MORNING AND 1 TABLET AT DINNER   metoprolol tartrate 25 MG tablet Commonly known as:  LOPRESSOR Take 25 mg by mouth 2 (two) times daily.   potassium chloride SA 20 MEQ tablet Commonly known as:  K-DUR,KLOR-CON TAKE 1 TABLET EVERY DAY   simvastatin 20 MG tablet Commonly known as:  ZOCOR Take 20 mg by mouth daily.   tamsulosin 0.4 MG Caps capsule Commonly known as:  FLOMAX Take by mouth.       Allergies:  Allergies  Allergen Reactions  . Ace Inhibitors Other (See Comments)    angioedema  . Allopurinol Rash  . Aspirin     Unknown reaction     Past Medical History:  Diagnosis Date  . Diabetes mellitus    insulin dependent  . Heart disease, unspecified   .  Hypercholesterolemia   . Hypertension   . Sarcoidosis ([place])   . Unspecified glaucoma     Past Surgical History:  Procedure Laterality Date  . CATARACT EXTRACTION    . knee replacement surgery     right    Family History  Problem Relation Age of Onset  . Cancer Neg Hx   . Hypertension Mother   . Diabetes Mellitus I Mother   .  Hypertension Father   . Coronary artery disease Father   . Hypertension Sister   . Hypertension Brother   . Coronary artery disease Brother   . Diabetes Mellitus I Brother   . Diabetes Mellitus I Brother     Social History:  reports that he quit smoking about 10 years ago. His smoking use included Cigarettes. He smoked 0.50 packs per day. He has never used smokeless tobacco. He reports that he does not drink alcohol or use drugs.  Review of Systems:  HYPERTENSION:    fairly well controlled with Lotrel and metoprolol    HYPOKALEMIA:  He does not appear to be taking any diuretics and not clear why his potassium is low periodically Taking potassium supplement now and potassium is Consistently normal  Lab Results  Component Value Date   CREATININE 1.22 12/11/2015   BUN 16 12/11/2015   NA 139 12/11/2015   K 4.0 12/11/2015   CL 105 12/11/2015   CO2 27 12/11/2015     HYPERLIPIDEMIA: The lipid abnormality consists of elevated LDL treated with simvastatin 20 mg only.   Also has low HDL, lipids are followed by PCP and cardiologist   Lab Results  Component Value Date   CHOL 124 06/11/2015   HDL 31.20 (L) 06/11/2015   LDLCALC 68 06/11/2015   TRIG 121.0 06/11/2015   CHOLHDL 4 06/11/2015    Foot exam done in 8/17, normal     Examination:   BP 128/68   Pulse 96   Ht _0  (1.753 m)   Wt 186 lb (84.4 kg)   SpO2 94%   BMI 27.47 kg/m   Body mass index is 27.47 kg/m.    Diabetic Foot Exam - Simple   Simple Foot Form Diabetic Foot exam was performed with the following findings:  Yes 12/14/2015  9:54 AM  Visual Inspection No deformities, no  ulcerations, no other skin breakdown bilaterally:  Yes Sensation Testing Intact to touch and monofilament testing bilaterally:  Yes Pulse Check Posterior Tibialis and Dorsalis pulse intact bilaterally:  Yes Comments     ASSESSMENT/ PLAN:   Diabetes type 2  See history of present illness for detailed discussion of his current management, blood sugar patterns and problems identified  The patient's diabetes control is Progressively worse with A1c over 8% now He does not understand the need to take mealtime insulin especially with foods that are high in carbohydrate such as lunch when he has a sandwich He still checking blood sugars only before breakfast and supper Also not motivated to exercise With Victoza he is not getting postprandial control FASTING blood sugars are excellent overall but showing some variability probably dependent on the sugars and night before  Explained to the patient that he needs a rapid acting insulin to cover postprandial rise in blood sugar and this was demonstrated on a chart today He does have regular insulin available at home Most likely he will need at least 6-8 units of regular insulin with lunch and similar amounts at suppertime, possibly more if he is eating a full meal in the evening  He will be needing to check more readings 2 hours after eating after his meals by rotation  rather than just before eating morning and evening Discussed blood sugar targets Also discussed adjustment of suppertime dose to get postprandial blood sugars on target He can check blood sugars in the mornings less often and more after meals  Also encouraged him to start walking for exercise or use exercise bike  regularly  Patient Instructions  Check blood sugars on waking up  2-3 x per week  Also check blood sugars about 2 hours after a meal and do this after different meals by rotation  Recommended blood sugar levels on waking up is 90-130 and about 2 hours after meal is  130-160  Please bring your blood sugar monitor to each visit, thank you  For any starchy meal with breab, rice, corn etc takke 6-8 units of Novolin R before the meal        Counseling time on subjects discussed above is over 50% of today's 25 minute visit     Raenette Sakata 12/16/2015, 2:32 PM

## 2015-12-14 NOTE — Patient Instructions (Signed)
Check blood sugars on waking up  2-3 x per week  Also check blood sugars about 2 hours after a meal and do this after different meals by rotation  Recommended blood sugar levels on waking up is 90-130 and about 2 hours after meal is 130-160  Please bring your blood sugar monitor to each visit, thank you  For any starchy meal with breab, rice, corn etc takke 6-8 units of Novolin R before the meal

## 2016-01-02 DIAGNOSIS — Z23 Encounter for immunization: Secondary | ICD-10-CM | POA: Diagnosis not present

## 2016-01-02 DIAGNOSIS — F320 Major depressive disorder, single episode, mild: Secondary | ICD-10-CM | POA: Diagnosis not present

## 2016-02-14 DIAGNOSIS — Z961 Presence of intraocular lens: Secondary | ICD-10-CM | POA: Diagnosis not present

## 2016-02-14 DIAGNOSIS — Z7984 Long term (current) use of oral hypoglycemic drugs: Secondary | ICD-10-CM | POA: Diagnosis not present

## 2016-02-14 DIAGNOSIS — E119 Type 2 diabetes mellitus without complications: Secondary | ICD-10-CM | POA: Diagnosis not present

## 2016-02-14 DIAGNOSIS — H1131 Conjunctival hemorrhage, right eye: Secondary | ICD-10-CM | POA: Diagnosis not present

## 2016-03-11 ENCOUNTER — Other Ambulatory Visit (INDEPENDENT_AMBULATORY_CARE_PROVIDER_SITE_OTHER): Payer: Commercial Managed Care - HMO

## 2016-03-11 ENCOUNTER — Telehealth: Payer: Self-pay | Admitting: Endocrinology

## 2016-03-11 DIAGNOSIS — E1165 Type 2 diabetes mellitus with hyperglycemia: Secondary | ICD-10-CM

## 2016-03-11 DIAGNOSIS — Z794 Long term (current) use of insulin: Secondary | ICD-10-CM | POA: Diagnosis not present

## 2016-03-11 LAB — COMPREHENSIVE METABOLIC PANEL
ALT: 15 U/L (ref 0–53)
AST: 16 U/L (ref 0–37)
Albumin: 4 g/dL (ref 3.5–5.2)
Alkaline Phosphatase: 67 U/L (ref 39–117)
BILIRUBIN TOTAL: 0.6 mg/dL (ref 0.2–1.2)
BUN: 11 mg/dL (ref 6–23)
CALCIUM: 9.4 mg/dL (ref 8.4–10.5)
CHLORIDE: 104 meq/L (ref 96–112)
CO2: 30 meq/L (ref 19–32)
Creatinine, Ser: 1.07 mg/dL (ref 0.40–1.50)
GFR: 87.08 mL/min (ref >60.00)
Glucose, Bld: 193 mg/dL — ABNORMAL HIGH (ref 70–99)
POTASSIUM: 3.7 meq/L (ref 3.5–5.1)
Sodium: 142 mEq/L (ref 135–145)
Total Protein: 8 g/dL (ref 6.0–8.3)

## 2016-03-11 LAB — HEMOGLOBIN A1C: HEMOGLOBIN A1C: 7.6 % — AB (ref 4.6–6.5)

## 2016-03-11 NOTE — Telephone Encounter (Signed)
Pt needs his Accu Check Aviva Meter Test Strips sent to the [facility].  He said that they were supposed to send [place] a request for them but [facility] still hasn't received anything. Please send ASAP.

## 2016-03-12 MED ORDER — ACCU-CHEK AVIVA PLUS W/DEVICE KIT: PACK | 0 refills | Status: AC

## 2016-03-12 NOTE — Telephone Encounter (Signed)
Refill submitted. 

## 2016-03-17 ENCOUNTER — Ambulatory Visit: Payer: Commercial Managed Care - HMO | Admitting: Endocrinology

## 2016-03-18 ENCOUNTER — Encounter: Payer: Self-pay | Admitting: Endocrinology

## 2016-03-18 ENCOUNTER — Other Ambulatory Visit: Payer: Self-pay

## 2016-03-18 ENCOUNTER — Ambulatory Visit (INDEPENDENT_AMBULATORY_CARE_PROVIDER_SITE_OTHER): Payer: Commercial Managed Care - HMO | Admitting: Endocrinology

## 2016-03-18 VITALS — BP 122/68 | HR 69 | Wt 188.0 lb

## 2016-03-18 DIAGNOSIS — E1165 Type 2 diabetes mellitus with hyperglycemia: Secondary | ICD-10-CM

## 2016-03-18 DIAGNOSIS — Z794 Long term (current) use of insulin: Secondary | ICD-10-CM

## 2016-03-18 DIAGNOSIS — I10 Essential (primary) hypertension: Secondary | ICD-10-CM

## 2016-03-18 MED ORDER — GLUCOSE BLOOD VI STRP: ORAL_STRIP | 1 refills | Status: DC

## 2016-03-18 NOTE — Progress Notes (Signed)
Patient ID: [name], male   DOB: December 30, 1940, 75 y.o.   MRN: [id number]   Reason for Appointment: Diabetes follow-up   History of Present Illness   Diagnosis: Type 2 DIABETES MELITUS, diagnosis 2003?  PREVIOUS history: He had been initially treated with various oral hypoglycemic drugs including metformin. Because of failure of control he was started on insulin in about 2007 Despite taking basal bolus insulin his A1c had been persistently over 8% and was 9.6% when first seen in consultation With starting Victoza in 12/2011 his blood sugars had improved significantly and A1c had come down to as low as 7% but was back up to 8.4% and 3/14    His blood sugars have been typically difficult to control in the past In 8/15 with starting a regular exercise program his overall control had improved   RECENT history:   Insulin regimen: Levemir 38 units acs.  regular insulin occasionally Non-insulin hypoglycemic drugs: metformin 1500 mg    Victoza  1.2 mg daily  His A1c has improved down to 7.6Previously 8.6  Current blood sugar patterns and problems identified   He was told to start taking regular insulin with his evening meal his last visit because of poor control  However he still is not understanding that he needs to take this before eating and not just when his blood sugar goes up  He is not consistent with his history but he probably is checking his blood sugars in the evenings after eating and will take regular insulin only if blood sugar is not normal  HIGHEST blood sugars are in the evening  This has occasionally resulted in low sugar during the night  He ran out of this prescription and has no blood sugars for the last 2 weeks or so  His fasting blood sugars are less variable now and generally better controlled, metformin was increased by 2 units on the last visit  He has also started doing a little exercise about every other day  However his weight is not  improving  Does take his Victoza regularly  He is reluctant to see the dietitian because of the cost, diet has been variable      Side effects from medications: None       Monitors blood glucose:  2-3 times a day.    Glucometer:  Accu-Chek  Blood Glucose readings from Download:   Mean values apply above for all meters except median for One Touch  PRE-MEAL Fasting Lunch 6-8 PM  Bedtime Overall  Glucose range: 74-162   80-328  73-221    Mean/median: 110   191  138  140    .          Meals: 2-3 meals per day.  For breakfast has eggs grits/toast at 9, lunch 2 pm, supper around 6-7 PM Physical activity: exercise:  using exercise bike  Dietician visit: 2013 with diabetes nurse practitioner  Wt Readings from Last 3 Encounters:  03/18/16 188 lb (85.3 kg)  12/14/15 186 lb (84.4 kg)  09/14/15 193 lb (87.5 kg)   Lab Results  Component Value Date   HGBA1C 7.6 (H) 03/11/2016   HGBA1C 8.6 (H) 12/11/2015   HGBA1C 7.7 (H) 09/10/2015   Lab Results  Component Value Date   MICROALBUR 1.8 09/10/2015   [place] 68 06/11/2015   CREATININE 1.07 03/11/2016   No visits with results within 1 Week(s) from this visit.  Latest known visit with results is:  Lab on 03/11/2016  Component Date  Value Ref Range Status  . Hgb A1c MFr Bld 03/11/2016 7.6* 4.6 - 6.5 % Final  . Sodium 03/11/2016 142  135 - 145 mEq/L Final  . Potassium 03/11/2016 3.7  3.5 - 5.1 mEq/L Final  . Chloride 03/11/2016 104  96 - 112 mEq/L Final  . CO2 03/11/2016 30  19 - 32 mEq/L Final  . Glucose, Bld 03/11/2016 193* 70 - 99 mg/dL Final  . BUN 03/11/2016 11  6 - 23 mg/dL Final  . Creatinine, Ser 03/11/2016 1.07  0.40 - 1.50 mg/dL Final  . Total Bilirubin 03/11/2016 0.6  0.2 - 1.2 mg/dL Final  . Alkaline Phosphatase 03/11/2016 67  39 - 117 U/L Final  . AST 03/11/2016 16  0 - 37 U/L Final  . ALT 03/11/2016 15  0 - 53 U/L Final  . Total Protein 03/11/2016 8.0  6.0 - 8.3 g/dL Final  . Albumin 03/11/2016 4.0  3.5 - 5.2 g/dL  Final  . Calcium 03/11/2016 9.4  8.4 - 10.5 mg/dL Final  . GFR 03/11/2016 87.08  >60.00 mL/min Final       Medication List       Accurate as of 03/18/16  4:41 PM. Always use your most recent med list.          ACCU-CHEK AVIVA PLUS w/Device Kit Use to check blood sugar 3 times per day dx code E11.65   ACCU-CHEK SOFTCLIX LANCETS lancets by Other route. Use as instructed   amLODipine 10 MG tablet Commonly known as:  NORVASC Take 10 mg by mouth daily.   aspirin 81 MG tablet Take 81 mg by mouth daily.   donepezil 5 MG tablet Commonly known as:  ARICEPT Take 5 mg by mouth at bedtime.   dorzolamide-timolol 22.3-6.8 MG/ML ophthalmic solution Commonly known as:  COSOPT 1 drop as directed.   ferrous fumarate 325 (106 Fe) MG Tabs tablet Commonly known as:  HEMOCYTE - 106 mg FE Take 1 tablet by mouth 3 (three) times daily.   fish oil-omega-3 fatty acids 1000 MG capsule Take 2 g by mouth daily.   glucose blood test strip Commonly known as:  ACCU-CHEK AVIVA PLUS Use as instructed to check blood sugar 3 times per day dx code E11.65   hydroxypropyl methylcellulose / hypromellose 2.5 % ophthalmic solution Commonly known as:  ISOPTO TEARS / GONIOVISC Place 1 drop into both eyes as directed.   Insulin Detemir 100 UNIT/ML Pen Commonly known as:  LEVEMIR FLEXTOUCH Inject 38 units daily   insulin regular 250 units/2.69m (100 units/mL) injection Commonly known as:  NOVOLIN R,HUMULIN R Inject 8 units daily before dinner   INSULIN SYRINGE .5CC/31GX5/16" 31G X 5/16" 0.5 ML Misc Use one to inject insulin daily   latanoprost 0.005 % ophthalmic solution Commonly known as:  XALATAN   liraglutide 18 MG/3ML Sopn Commonly known as:  VICTOZA Inject 0.2 mLs (1.2 mg total) into the skin daily.   metFORMIN 1000 MG tablet Commonly known as:  GLUCOPHAGE TAKE 1/2 TABLET EACH MORNING AND 1 TABLET AT DINNER   metoprolol tartrate 25 MG tablet Commonly known as:  LOPRESSOR Take 25 mg  by mouth 2 (two) times daily.   potassium chloride SA 20 MEQ tablet Commonly known as:  K-DUR,KLOR-CON TAKE 1 TABLET EVERY DAY   simvastatin 20 MG tablet Commonly known as:  ZOCOR Take 20 mg by mouth daily.   tamsulosin 0.4 MG Caps capsule Commonly known as:  FLOMAX Take by mouth.       Allergies:  Allergies  Allergen Reactions  . Ace Inhibitors Other (See Comments)    angioedema  . Allopurinol Rash  . Aspirin     Unknown reaction     Past Medical History:  Diagnosis Date  . Diabetes mellitus    insulin dependent  . Heart disease, unspecified   . Hypercholesterolemia   . Hypertension   . Sarcoidosis ([place])   . Unspecified glaucoma(365.9)     Past Surgical History:  Procedure Laterality Date  . CATARACT EXTRACTION    . knee replacement surgery     right    Family History  Problem Relation Age of Onset  . Cancer Neg Hx   . Hypertension Mother   . Diabetes Mellitus I Mother   . Hypertension Father   . Coronary artery disease Father   . Hypertension Sister   . Hypertension Brother   . Coronary artery disease Brother   . Diabetes Mellitus I Brother   . Diabetes Mellitus I Brother     Social History:  reports that he quit smoking about 10 years ago. His smoking use included Cigarettes. He smoked 0.50 packs per day. He has never used smokeless tobacco. He reports that he does not drink alcohol or use drugs.  Review of Systems:  HYPERTENSION:    well controlled with Lotrel and metoprolol    HYPOKALEMIA:  Etiology unclear, has been mild Taking potassium supplement As prescribed and potassium is Consistently normal  Lab Results  Component Value Date   CREATININE 1.07 03/11/2016   BUN 11 03/11/2016   NA 142 03/11/2016   K 3.7 03/11/2016   CL 104 03/11/2016   CO2 30 03/11/2016     HYPERLIPIDEMIA: The lipid abnormality consists of elevated LDL treated with simvastatin 20 mg.   Also has low HDL, lipids are followed by PCP and cardiologist   Lab  Results  Component Value Date   CHOL 124 06/11/2015   HDL 31.20 (L) 06/11/2015   LDLCALC 68 06/11/2015   TRIG 121.0 06/11/2015   CHOLHDL 4 06/11/2015    Foot exam done in 8/17, normal     Examination:   BP 122/68   Pulse 69   Wt 188 lb (85.3 kg)   SpO2 95%   BMI 27.76 kg/m   Body mass index is 27.76 kg/m.    ASSESSMENT/ PLAN:   Diabetes type 2  See history of present illness for detailed discussion of his current management, blood sugar patterns and problems identified  The patient's diabetes control is Generally better with improved A1c but this is still over 7% and this was discussed. He does not understand the need to take mealtime insulin before eating in the evening He is afraid of low sugars and is only taking the regular insulin when the blood sugar goes up after eating not clear how long after eating he is monitoring, some readings in the evenings maybe before eating also Does not understand the concept of carbohydrates and booklet on this was given He does sometimes eat sweets which will make his blood sugar was significantly higher  Probably does have better readings in the evening if he is watching his diet consistently He does not check his readings after breakfast or lunch Not able to lose weight despite starting to exercise FASTING blood sugars are excellent overall , lowest reading 74  Explained to the patient that he needs to take the mealtime insulin before eating instead of after regardless of pre-meal blood sugar He was given instruction to take  6-8 units and may skip the dose only if not eating any carbohydrates Discussed that postprandial insulin will cause some overnight hypoglycemia Also since fasting readings may improve further with consistent regular insulin he will reduce Levemir to 36 for now  Patient Instructions  6-8 Novolin R BEFORE EATING unless not eating any CARBS  Take this DAILY  36 LEVEMIR DAILY      Counseling time on subjects  discussed above is over 50% of today's 25 minute visit     Virga Haltiwanger 03/18/2016, 4:41 PM

## 2016-03-18 NOTE — Patient Instructions (Signed)
6-8 Novolin R BEFORE EATING unless not eating any CARBS  Take this DAILY  36 LEVEMIR DAILY

## 2016-03-20 ENCOUNTER — Other Ambulatory Visit: Payer: Self-pay | Admitting: Endocrinology

## 2016-04-01 DIAGNOSIS — Z961 Presence of intraocular lens: Secondary | ICD-10-CM | POA: Diagnosis not present

## 2016-04-01 DIAGNOSIS — H401113 Primary open-angle glaucoma, right eye, severe stage: Secondary | ICD-10-CM | POA: Diagnosis not present

## 2016-04-01 DIAGNOSIS — H17821 Peripheral opacity of cornea, right eye: Secondary | ICD-10-CM | POA: Diagnosis not present

## 2016-04-01 DIAGNOSIS — H11423 Conjunctival edema, bilateral: Secondary | ICD-10-CM | POA: Diagnosis not present

## 2016-04-01 DIAGNOSIS — H47233 Glaucomatous optic atrophy, bilateral: Secondary | ICD-10-CM | POA: Diagnosis not present

## 2016-04-01 DIAGNOSIS — H18413 Arcus senilis, bilateral: Secondary | ICD-10-CM | POA: Diagnosis not present

## 2016-04-01 DIAGNOSIS — H11153 Pinguecula, bilateral: Secondary | ICD-10-CM | POA: Diagnosis not present

## 2016-04-01 DIAGNOSIS — H04123 Dry eye syndrome of bilateral lacrimal glands: Secondary | ICD-10-CM | POA: Diagnosis not present

## 2016-04-01 DIAGNOSIS — H401122 Primary open-angle glaucoma, left eye, moderate stage: Secondary | ICD-10-CM | POA: Diagnosis not present

## 2016-04-30 DIAGNOSIS — F320 Major depressive disorder, single episode, mild: Secondary | ICD-10-CM | POA: Diagnosis not present

## 2016-04-30 DIAGNOSIS — R350 Frequency of micturition: Secondary | ICD-10-CM | POA: Diagnosis not present

## 2016-04-30 DIAGNOSIS — I10 Essential (primary) hypertension: Secondary | ICD-10-CM | POA: Diagnosis not present

## 2016-04-30 DIAGNOSIS — Z0000 Encounter for general adult medical examination without abnormal findings: Secondary | ICD-10-CM | POA: Diagnosis not present

## 2016-04-30 DIAGNOSIS — R413 Other amnesia: Secondary | ICD-10-CM | POA: Diagnosis not present

## 2016-04-30 DIAGNOSIS — E785 Hyperlipidemia, unspecified: Secondary | ICD-10-CM | POA: Diagnosis not present

## 2016-04-30 DIAGNOSIS — Z1211 Encounter for screening for malignant neoplasm of colon: Secondary | ICD-10-CM | POA: Diagnosis not present

## 2016-04-30 DIAGNOSIS — M109 Gout, unspecified: Secondary | ICD-10-CM | POA: Diagnosis not present

## 2016-05-09 ENCOUNTER — Telehealth: Payer: Self-pay | Admitting: Endocrinology

## 2016-05-09 NOTE — Telephone Encounter (Signed)
victoza and levemir are too expensive, alternates need to be called in please.   Please call into [place]

## 2016-05-09 NOTE — Telephone Encounter (Signed)
Pt is out now he has high blood sugars, please call in to [facility] for a 30 day supply and to [facility] for a 90 day supply please

## 2016-05-09 NOTE — Telephone Encounter (Signed)
The only alternatives are the [facility] Novolin R on Novolin N relion brands that he will have to take with a syringe and vial.  He will need to take 10 units in the morning and 24 of the Novolin N at bedtime and also take 8 units of regular insulin 15 minutes before suppertime He can have one vial of each with 0.3 mL insulin syringes.  Need to see him sooner if he continues to have problems with blood sugar

## 2016-05-13 ENCOUNTER — Other Ambulatory Visit: Payer: Self-pay | Admitting: Endocrinology

## 2016-05-20 NOTE — Telephone Encounter (Signed)
Spoke with the patient's wife and they are using the relion and the patient ha been doing much beter

## 2016-06-02 ENCOUNTER — Other Ambulatory Visit: Payer: Self-pay | Admitting: Family Medicine

## 2016-06-02 MED ORDER — INSULIN REGULAR HUMAN 100 UNIT/ML IJ SOLN: INTRAMUSCULAR | 3 refills | Status: DC

## 2016-06-03 DIAGNOSIS — H353131 Nonexudative age-related macular degeneration, bilateral, early dry stage: Secondary | ICD-10-CM | POA: Diagnosis not present

## 2016-06-03 DIAGNOSIS — H11423 Conjunctival edema, bilateral: Secondary | ICD-10-CM | POA: Diagnosis not present

## 2016-06-03 DIAGNOSIS — H02401 Unspecified ptosis of right eyelid: Secondary | ICD-10-CM | POA: Diagnosis not present

## 2016-06-03 DIAGNOSIS — H401122 Primary open-angle glaucoma, left eye, moderate stage: Secondary | ICD-10-CM | POA: Diagnosis not present

## 2016-06-03 DIAGNOSIS — E119 Type 2 diabetes mellitus without complications: Secondary | ICD-10-CM | POA: Diagnosis not present

## 2016-06-03 DIAGNOSIS — H11153 Pinguecula, bilateral: Secondary | ICD-10-CM | POA: Diagnosis not present

## 2016-06-03 DIAGNOSIS — H47233 Glaucomatous optic atrophy, bilateral: Secondary | ICD-10-CM | POA: Diagnosis not present

## 2016-06-03 DIAGNOSIS — H401113 Primary open-angle glaucoma, right eye, severe stage: Secondary | ICD-10-CM | POA: Diagnosis not present

## 2016-06-03 DIAGNOSIS — Z7984 Long term (current) use of oral hypoglycemic drugs: Secondary | ICD-10-CM | POA: Diagnosis not present

## 2016-06-11 ENCOUNTER — Other Ambulatory Visit: Payer: Self-pay | Admitting: Endocrinology

## 2016-06-17 ENCOUNTER — Other Ambulatory Visit (INDEPENDENT_AMBULATORY_CARE_PROVIDER_SITE_OTHER): Payer: Commercial Managed Care - HMO

## 2016-06-17 DIAGNOSIS — E1165 Type 2 diabetes mellitus with hyperglycemia: Secondary | ICD-10-CM

## 2016-06-17 DIAGNOSIS — Z794 Long term (current) use of insulin: Secondary | ICD-10-CM

## 2016-06-17 LAB — COMPREHENSIVE METABOLIC PANEL
ALT: 19 U/L (ref 0–53)
AST: 22 U/L (ref 0–37)
Albumin: 4.1 g/dL (ref 3.5–5.2)
Alkaline Phosphatase: 59 U/L (ref 39–117)
BUN: 14 mg/dL (ref 6–23)
CALCIUM: 10 mg/dL (ref 8.4–10.5)
CHLORIDE: 105 meq/L (ref 96–112)
CO2: 27 meq/L (ref 19–32)
CREATININE: 1.16 mg/dL (ref 0.40–1.50)
GFR: 79.27 mL/min (ref >60.00)
Glucose, Bld: 138 mg/dL — ABNORMAL HIGH (ref 70–99)
Potassium: 4 mEq/L (ref 3.5–5.1)
Sodium: 141 mEq/L (ref 135–145)
Total Bilirubin: 0.5 mg/dL (ref 0.2–1.2)
Total Protein: 8 g/dL (ref 6.0–8.3)

## 2016-06-17 LAB — MICROALBUMIN / CREATININE URINE RATIO
CREATININE, U: 240.3 mg/dL
MICROALB/CREAT RATIO: 1.7 mg/g (ref 0.0–30.0)
Microalb, Ur: 4 mg/dL — ABNORMAL HIGH (ref 0.0–1.9)

## 2016-06-17 LAB — HEMOGLOBIN A1C: HEMOGLOBIN A1C: 8.5 % — AB (ref 4.6–6.5)

## 2016-06-20 ENCOUNTER — Encounter: Payer: Self-pay | Admitting: Endocrinology

## 2016-06-20 ENCOUNTER — Ambulatory Visit (INDEPENDENT_AMBULATORY_CARE_PROVIDER_SITE_OTHER): Payer: Commercial Managed Care - HMO | Admitting: Endocrinology

## 2016-06-20 VITALS — BP 134/64 | HR 75 | Ht 69.0 in | Wt 184.0 lb

## 2016-06-20 DIAGNOSIS — Z794 Long term (current) use of insulin: Secondary | ICD-10-CM | POA: Diagnosis not present

## 2016-06-20 DIAGNOSIS — I10 Essential (primary) hypertension: Secondary | ICD-10-CM | POA: Diagnosis not present

## 2016-06-20 DIAGNOSIS — E1165 Type 2 diabetes mellitus with hyperglycemia: Secondary | ICD-10-CM | POA: Diagnosis not present

## 2016-06-20 NOTE — Progress Notes (Signed)
Patient ID: [name], male   DOB: 1940-08-21, 76 y.o.   MRN: [id number]   Reason for Appointment: Diabetes follow-up   History of Present Illness   Diagnosis: Type 2 DIABETES MELITUS, diagnosis 2003?  PREVIOUS history: He had been initially treated with various oral hypoglycemic drugs including metformin. Because of failure of control he was started on insulin in about 2007 Despite taking basal bolus insulin his A1c had been persistently over 8% and was 9.6% when first seen in consultation With starting Victoza in 12/2011 his blood sugars had improved significantly and A1c had come down to as low as 7% but was back up to 8.4% and 3/14    His blood sugars have been typically difficult to control in the past In 8/15 with starting a regular exercise program his overall control had improved   RECENT history:   Insulin regimen: Novolin R 21 units ?  Frequency  Non-insulin hypoglycemic drugs: metformin 1500 mg    not on Victoza  1.2 mg daily  His A1c has gone up to 8.5, previously down to 7.6   Current blood sugar patterns and problems identified   He was told to start taking [facility] brand regular when he had called about the cost of Humalog  Although he was previously taking 6-8 units of Humalog he says he is taking 21 units of regular insulin but does not explain when he takes it and not clear if he is just taking it when his blood sugars reading high at any time  However he still is not understanding that he needs to take this before eating and not just when his blood sugar is higher  He is not consistent with his history about how his checking his sugars in the evenings but he probably is checking his blood sugars in the evenings before eating mostly  HIGHEST blood sugars are in the evening, probably after supper as he is checking mostly before eating  He now says that for the last 2-3 months he has not taken any Victoza or LEVEMIR also because of the cost although has  not checked to see how much he will cost this year when he is out of the donut hole  Surprisingly he has lost weight despite not taking Victoza  Blood sugars are sporadically higher FASTING also  Only rarely checking blood sugars in the afternoon and these are usually not high  He is reluctant to see the dietitian because of the cost, diet has been variable      Side effects from medications: None       Monitors blood glucose:  2-3 times a day.    Glucometer:  Accu-Chek  Blood Glucose readings from Download:    Mean values apply above for all meters except median for One Touch  PRE-MEAL Fasting Lunch Dinner Bedtime Overall  Glucose range: 95-182   68-190  141-267    Mean/median: 125   128  169  128 +/-35               Meals: 2-3 meals per day.  For breakfast has eggs grits/toast at 9, lunch 2 pm, supper around 6-7 PM Physical activity: exercise:  using exercise bike  Dietician visit: 2013 with diabetes nurse practitioner  Wt Readings from Last 3 Encounters:  06/20/16 184 lb (83.5 kg)  03/18/16 188 lb (85.3 kg)  12/14/15 186 lb (84.4 kg)   Lab Results  Component Value Date   HGBA1C 8.5 (H) 06/17/2016   HGBA1C  7.6 (H) 03/11/2016   HGBA1C 8.6 (H) 12/11/2015   Lab Results  Component Value Date   MICROALBUR 4.0 (H) 06/17/2016   LDLCALC 68 06/11/2015   CREATININE 1.16 06/17/2016   Lab on 06/17/2016  Component Date Value Ref Range Status  . Hgb A1c MFr Bld 06/17/2016 8.5* 4.6 - 6.5 % Final  . Sodium 06/17/2016 141  135 - 145 mEq/L Final  . Potassium 06/17/2016 4.0  3.5 - 5.1 mEq/L Final  . Chloride 06/17/2016 105  96 - 112 mEq/L Final  . CO2 06/17/2016 27  19 - 32 mEq/L Final  . Glucose, Bld 06/17/2016 138* 70 - 99 mg/dL Final  . BUN 06/17/2016 14  6 - 23 mg/dL Final  . Creatinine, Ser 06/17/2016 1.16  0.40 - 1.50 mg/dL Final  . Total Bilirubin 06/17/2016 0.5  0.2 - 1.2 mg/dL Final  . Alkaline Phosphatase 06/17/2016 59  39 - 117 U/L Final  . AST 06/17/2016 22  0 -  37 U/L Final  . ALT 06/17/2016 19  0 - 53 U/L Final  . Total Protein 06/17/2016 8.0  6.0 - 8.3 g/dL Final  . Albumin 06/17/2016 4.1  3.5 - 5.2 g/dL Final  . Calcium 06/17/2016 10.0  8.4 - 10.5 mg/dL Final  . GFR 06/17/2016 79.27  >60.00 mL/min Final  . Microalb, Ur 06/17/2016 4.0* 0.0 - 1.9 mg/dL Final  . Creatinine,U 06/17/2016 240.3  mg/dL Final  . Microalb Creat Ratio 06/17/2016 1.7  0.0 - 30.0 mg/g Final     Allergies as of 06/20/2016      Reactions   Ace Inhibitors Other (See Comments)   angioedema   Allopurinol Rash   Aspirin    Unknown reaction      Medication List       Accurate as of 06/20/16 11:59 PM. Always use your most recent med list.          ACCU-CHEK AVIVA PLUS test strip Generic drug:  glucose blood USE AS INSTRUCTED TO CHECK BLOOD SUGAR THREE TIMES DAILY   ACCU-CHEK AVIVA PLUS w/Device Kit Use to check blood sugar 3 times per day dx code E11.65   ACCU-CHEK SOFTCLIX LANCETS lancets by Other route. Use as instructed   amLODipine 10 MG tablet Commonly known as:  NORVASC Take 10 mg by mouth daily.   aspirin 81 MG tablet Take 81 mg by mouth daily.   donepezil 5 MG tablet Commonly known as:  ARICEPT Take 5 mg by mouth at bedtime.   dorzolamide-timolol 22.3-6.8 MG/ML ophthalmic solution Commonly known as:  COSOPT 1 drop as directed.   ferrous fumarate 325 (106 Fe) MG Tabs tablet Commonly known as:  HEMOCYTE - 106 mg FE Take 1 tablet by mouth 3 (three) times daily.   fish oil-omega-3 fatty acids 1000 MG capsule Take 2 g by mouth daily.   hydroxypropyl methylcellulose / hypromellose 2.5 % ophthalmic solution Commonly known as:  ISOPTO TEARS / GONIOVISC Place 1 drop into both eyes as directed.   Insulin Detemir 100 UNIT/ML Pen Commonly known as:  LEVEMIR FLEXTOUCH Inject 38 units daily   insulin regular 250 units/2.30m (100 units/mL) injection Commonly known as:  NOVOLIN R RELION INJECT 8 UNITS DAILY BEFORE DINNER   INSULIN SYRINGE  .5CC/31GX5/16" 31G X 5/16" 0.5 ML Misc Use one to inject insulin daily   latanoprost 0.005 % ophthalmic solution Commonly known as:  XALATAN   liraglutide 18 MG/3ML Sopn Commonly known as:  VICTOZA Inject 0.2 mLs (1.2 mg total) into the  skin daily.   metFORMIN 1000 MG tablet Commonly known as:  GLUCOPHAGE TAKE 1/2 TABLET EACH MORNING AND 1 TABLET AT DINNER   metoprolol tartrate 25 MG tablet Commonly known as:  LOPRESSOR Take 25 mg by mouth 2 (two) times daily.   potassium chloride SA 20 MEQ tablet Commonly known as:  K-DUR,KLOR-CON TAKE 1 TABLET EVERY DAY   simvastatin 20 MG tablet Commonly known as:  ZOCOR Take 20 mg by mouth daily.   tamsulosin 0.4 MG Caps capsule Commonly known as:  FLOMAX Take by mouth.       Allergies:  Allergies  Allergen Reactions  . Ace Inhibitors Other (See Comments)    angioedema  . Allopurinol Rash  . Aspirin     Unknown reaction     Past Medical History:  Diagnosis Date  . Diabetes mellitus    insulin dependent  . Heart disease, unspecified   . Hypercholesterolemia   . Hypertension   . Sarcoidosis ([place])   . Unspecified glaucoma(365.9)     Past Surgical History:  Procedure Laterality Date  . CATARACT EXTRACTION    . knee replacement surgery     right    Family History  Problem Relation Age of Onset  . Cancer Neg Hx   . Hypertension Mother   . Diabetes Mellitus I Mother   . Hypertension Father   . Coronary artery disease Father   . Hypertension Sister   . Hypertension Brother   . Coronary artery disease Brother   . Diabetes Mellitus I Brother   . Diabetes Mellitus I Brother     Social History:  reports that he quit smoking about 11 years ago. His smoking use included Cigarettes. He smoked 0.50 packs per day. He has never used smokeless tobacco. He reports that he does not drink alcohol or use drugs.  Review of Systems:  HYPERTENSION:    well controlled with Lotrel and metoprolol   HYPERLIPIDEMIA: The lipid  abnormality consists of elevated LDL treated with simvastatin 20 mg.   Also has low HDL, lipids are followed by PCP and cardiologist   Lab Results  Component Value Date   CHOL 124 06/11/2015   HDL 31.20 (L) 06/11/2015   LDLCALC 68 06/11/2015   TRIG 121.0 06/11/2015   CHOLHDL 4 06/11/2015    Foot exam done in 8/17, normal     Examination:   BP 134/64   Pulse 75   Ht 5' 9"  (1.753 m)   Wt 184 lb (83.5 kg)   SpO2 95%   BMI 27.17 kg/m   Body mass index is 27.17 kg/m.    ASSESSMENT/ PLAN:   Diabetes type 2  See history of present illness for detailed discussion of his current management, blood sugar patterns and problems identified  The patient's diabetes control is worse with A1c 8.6 now Patient will not take any brand name insulin or Victoza and refuses to consider starting back on Victoza because of presumed high expense Difficult to know where his blood sugars are running higher with his A1c being about 1% higher than before Most likely he is just checking his blood sugar before eating and occasionally after eating Surprisingly his fasting readings are not consistently high even with stopping his 38 units of Levemir  Although it was discussed with the patient that he has blood sugars over 200 at times and as much as 182 in the morning he states that his home record indicates his blood sugars are not high at any time, have  shown in his blood sugar download from today Overall communication with the patient is difficult to achieve, he is not accepting the fact that his A1c is indicating higher readings and his therapy needs to be changed He also states that medications will cause more harm than good and do not change his treatment now  However explained to him that he must take his regular insulin before supper consistently so that his blood sugars do not go up and he does not need to take more than 18 units at that time Also reminded him to check more readings after meals and  keep track of which readings are after eating, discussed blood sugar targets  Patient Instructions  Check blood sugars on waking up 3x weekly   Also check blood sugars about 2 hours after a meal and do this after different meals by rotation  Recommended blood sugar levels on waking up is 90-130 and about 2 hours after meal is 130-160  Please bring your blood sugar monitor to each visit, thank you  Take 8 Units with every meal of R insulin  Exercise daily     Counseling time on subjects discussed above is over 50% of today's 25 minute visit     Raileigh Sabater 06/22/2016, 9:05 PM

## 2016-06-20 NOTE — Patient Instructions (Addendum)
Check blood sugars on waking up 3x weekly   Also check blood sugars about 2 hours after a meal and do this after different meals by rotation  Recommended blood sugar levels on waking up is 90-130 and about 2 hours after meal is 130-160  Please bring your blood sugar monitor to each visit, thank you  Take 8 Units with every meal of R insulin  Exercise daily

## 2016-07-04 ENCOUNTER — Encounter (HOSPITAL_COMMUNITY): Payer: Self-pay | Admitting: *Deleted

## 2016-07-04 ENCOUNTER — Emergency Department (HOSPITAL_COMMUNITY)
Admission: EM | Admit: 2016-07-04 | Discharge: 2016-07-05 | Disposition: A | Discharge status: Home / Self Care | Payer: Commercial Managed Care - HMO | Attending: Emergency Medicine | Admitting: Emergency Medicine

## 2016-07-04 DIAGNOSIS — D509 Iron deficiency anemia, unspecified: Secondary | ICD-10-CM | POA: Insufficient documentation

## 2016-07-04 DIAGNOSIS — Z79899 Other long term (current) drug therapy: Secondary | ICD-10-CM | POA: Diagnosis not present

## 2016-07-04 DIAGNOSIS — E1121 Type 2 diabetes mellitus with diabetic nephropathy: Secondary | ICD-10-CM | POA: Insufficient documentation

## 2016-07-04 DIAGNOSIS — R55 Syncope and collapse: Secondary | ICD-10-CM | POA: Diagnosis not present

## 2016-07-04 DIAGNOSIS — Z8546 Personal history of malignant neoplasm of prostate: Secondary | ICD-10-CM | POA: Diagnosis not present

## 2016-07-04 DIAGNOSIS — I119 Hypertensive heart disease without heart failure: Secondary | ICD-10-CM | POA: Insufficient documentation

## 2016-07-04 DIAGNOSIS — Z794 Long term (current) use of insulin: Secondary | ICD-10-CM | POA: Diagnosis not present

## 2016-07-04 DIAGNOSIS — Z87891 Personal history of nicotine dependence: Secondary | ICD-10-CM | POA: Insufficient documentation

## 2016-07-04 DIAGNOSIS — R42 Dizziness and giddiness: Secondary | ICD-10-CM | POA: Diagnosis not present

## 2016-07-04 DIAGNOSIS — Z96651 Presence of right artificial knee joint: Secondary | ICD-10-CM | POA: Insufficient documentation

## 2016-07-04 DIAGNOSIS — Z7982 Long term (current) use of aspirin: Secondary | ICD-10-CM | POA: Diagnosis not present

## 2016-07-04 LAB — URINALYSIS, ROUTINE W REFLEX MICROSCOPIC
Bilirubin Urine: NEGATIVE
GLUCOSE, UA: NEGATIVE mg/dL
Hgb urine dipstick: NEGATIVE
Ketones, ur: NEGATIVE mg/dL
LEUKOCYTES UA: NEGATIVE
NITRITE: NEGATIVE
PROTEIN: NEGATIVE mg/dL
Specific Gravity, Urine: 1.014 (ref 1.005–1.030)
pH: 5 (ref 5.0–8.0)

## 2016-07-04 LAB — CBC
HEMATOCRIT: 37.7 % — AB (ref 39.0–52.0)
Hemoglobin: 12.1 g/dL — ABNORMAL LOW (ref 13.0–17.0)
MCH: 24.1 pg — ABNORMAL LOW (ref 26.0–34.0)
MCHC: 32.1 g/dL (ref 30.0–36.0)
MCV: 75 fL — AB (ref 78.0–100.0)
Platelets: 191 10*3/uL (ref 150–400)
RBC: 5.03 MIL/uL (ref 4.22–5.81)
RDW: 16 % — ABNORMAL HIGH (ref 11.5–15.5)
WBC: 7.6 10*3/uL (ref 4.0–10.5)

## 2016-07-04 LAB — BASIC METABOLIC PANEL
ANION GAP: 10 (ref 5–15)
BUN: 9 mg/dL (ref 6–20)
CHLORIDE: 105 mmol/L (ref 101–111)
CO2: 25 mmol/L (ref 22–32)
Calcium: 9.4 mg/dL (ref 8.9–10.3)
Creatinine, Ser: 1 mg/dL (ref 0.61–1.24)
Glucose, Bld: 127 mg/dL — ABNORMAL HIGH (ref 65–99)
Potassium: 4.1 mmol/L (ref 3.5–5.1)
Sodium: 140 mmol/L (ref 135–145)

## 2016-07-04 NOTE — ED Triage Notes (Signed)
Pt reports feeling dizzy today while standing and fell up against his refrigerator, pt reports having another episode today while laying down, pt hx of stroke, denies deficits from previous stroke, pt takes 81 mg ASA daily, pt A&O x4, no slurred speech, pt has R sided facial droop, pt reports this is pts baseline, A&O x4, denies CP & SOB, denies LOC denies hitting head

## 2016-07-04 NOTE — ED Provider Notes (Signed)
[facility] DEPT Provider Note   CSN: [id number] Arrival date & time: 07/04/16  1711     History   Chief Complaint Chief Complaint  Patient presents with  . Near Syncope    HPI [name] is a 76 y.o. male.  He had 2 transient episodes of dizziness today. On one occasion, he was bending over to get something from the refrigerator and he felt like he was losing his balance. He did not fall and did not lose consciousness. The second episode occurred when he was getting up from bed. Both episodes lasted a very short period of time. He denied any visual change, chest pain, heaviness, tightness, pressure. He denied difficulty breathing. He denied palpitations. He denied any weakness, numbness, tingling. He has a history of prior stroke without residual deficit. He has not had any difficulty with ambulating since these 2 episodes.   The history is provided by the patient.  Near Syncope     Past Medical History:  Diagnosis Date  . Diabetes mellitus    insulin dependent  . Heart disease, unspecified   . Hypercholesterolemia   . Hypertension   . Sarcoidosis ([place])   . Unspecified glaucoma(365.9)     Patient Active Problem List   Diagnosis Date Noted  . Angioedema 02/01/2014  . Essential hypertension 02/01/2014  . Type 2 diabetes mellitus with diabetic nephropathy ([place]) 02/01/2014  . Hyperlipemia 02/01/2014  . Pure hypercholesterolemia 12/08/2012  . Type II or unspecified type diabetes mellitus without mention of complication, uncontrolled 12/06/2012  . Unspecified essential hypertension 12/06/2012  . Prostate cancer ([place]) 04/30/2011    Past Surgical History:  Procedure Laterality Date  . CATARACT EXTRACTION    . knee replacement surgery     right       Home Medications    Prior to Admission medications   Medication Sig Start Date End Date Taking? Authorizing Provider  ACCU-CHEK AVIVA PLUS test strip USE AS INSTRUCTED TO CHECK BLOOD SUGAR THREE TIMES DAILY  03/21/16   [name], MD  ACCU-CHEK SOFTCLIX LANCETS lancets by Other route. Use as instructed    Historical Provider, MD  amLODipine (NORVASC) 10 MG tablet Take 10 mg by mouth daily.    Historical Provider, MD  aspirin 81 MG tablet Take 81 mg by mouth daily.    Historical Provider, MD  Blood Glucose Monitoring Suppl (ACCU-CHEK AVIVA PLUS) w/Device KIT Use to check blood sugar 3 times per day dx code E11.65 03/12/16   [name], MD  donepezil (ARICEPT) 5 MG tablet Take 5 mg by mouth at bedtime.  03/22/15   Historical Provider, MD  dorzolamide-timolol (COSOPT) 22.3-6.8 MG/ML ophthalmic solution 1 drop as directed.  10/13/12   Historical Provider, MD  ferrous fumarate (HEMOCYTE - 106 MG FE) 325 (106 FE) MG TABS Take 1 tablet by mouth 3 (three) times daily.    Historical Provider, MD  fish oil-omega-3 fatty acids 1000 MG capsule Take 2 g by mouth daily.      Historical Provider, MD  hydroxypropyl methylcellulose / hypromellose (ISOPTO TEARS / GONIOVISC) 2.5 % ophthalmic solution Place 1 drop into both eyes as directed.    Historical Provider, MD  Insulin Detemir (LEVEMIR FLEXTOUCH) 100 UNIT/ML Pen Inject 38 units daily 11/02/15   [name], MD  insulin regular (NOVOLIN R RELION) 250 units/2.58m (100 units/mL) injection INJECT 8 UNITS DAILY BEFORE DINNER 06/02/16   AElayne Snare MD  Insulin Syringe-Needle U-100 (INSULIN SYRINGE .5CC/31GX5/16") 31G X 5/16" 0.5 ML MISC Use one to  inject insulin daily 05/02/15   [name], MD  latanoprost (XALATAN) 0.005 % ophthalmic solution  02/17/14   Historical Provider, MD  Liraglutide (VICTOZA) 18 MG/3ML SOPN Inject 0.2 mLs (1.2 mg total) into the skin daily. 11/02/15   [name], MD  metFORMIN (GLUCOPHAGE) 1000 MG tablet TAKE 1/2 TABLET EACH MORNING AND 1 TABLET AT [facility] 06/13/16   [name], MD  metoprolol tartrate (LOPRESSOR) 25 MG tablet Take 25 mg by mouth 2 (two) times daily.  04/04/14   Historical Provider, MD  potassium chloride SA (K-DUR,KLOR-CON) 20 MEQ tablet  TAKE 1 TABLET EVERY DAY 11/07/15   Renato Shin, MD  simvastatin (ZOCOR) 20 MG tablet Take 20 mg by mouth daily.    Historical Provider, MD  Tamsulosin HCl (FLOMAX) 0.4 MG CAPS Take by mouth.      Historical Provider, MD    Family History Family History  Problem Relation Age of Onset  . Hypertension Mother   . Diabetes Mellitus I Mother   . Hypertension Father   . Coronary artery disease Father   . Hypertension Sister   . Hypertension Brother   . Coronary artery disease Brother   . Diabetes Mellitus I Brother   . Diabetes Mellitus I Brother   . Cancer Neg Hx     Social History Social History  Substance Use Topics  . Smoking status: Former Smoker    Packs/day: 0.50    Types: Cigarettes    Quit date: 04/21/2005  . Smokeless tobacco: Never Used  . Alcohol use No     Allergies   Ace inhibitors; Allopurinol; and Aspirin   Review of Systems Review of Systems  Cardiovascular: Positive for near-syncope.  All other systems reviewed and are negative.    Physical Exam Updated Vital Signs BP (!) 146/66   Pulse 63   Temp 97.8 F (36.6 C) (Oral)   Resp 16   Ht 6' (1.829 m)   Wt 182 lb (82.6 kg)   SpO2 96%   BMI 24.68 kg/m   Physical Exam  Nursing note and vitals reviewed.  76 year old male, resting comfortably and in no acute distress. Vital signs are significant for mild hypertension. Oxygen saturation is 96%, which is normal. Head is normocephalic and atraumatic. PERRLA, EOMI. Arcus senilis present. Oropharynx is clear. Neck is nontender and supple without adenopathy or JVD. No carotid bruit. Back is nontender and there is no CVA tenderness. Lungs are clear without rales, wheezes, or rhonchi. Chest is nontender. Heart has regular rate and rhythm without murmur. Abdomen is soft, flat, nontender without masses or hepatosplenomegaly and peristalsis is normoactive. Extremities have no cyanosis or edema, full range of motion is present. Skin is warm and dry without  rash. Neurologic: Mental status is normal, cranial nerves are intact, there are no motor or sensory deficits. There is no pronator drift. Romberg is negative. Dizziness is not reproduced by passive head movement.  ED Treatments / Results  Labs (all labs ordered are listed, but only abnormal results are displayed) Labs Reviewed  BASIC METABOLIC PANEL - Abnormal; Notable for the following:       Result Value   Glucose, Bld 127 (*)    All other components within normal limits  CBC - Abnormal; Notable for the following:    Hemoglobin 12.1 (*)    HCT 37.7 (*)    MCV 75.0 (*)    MCH 24.1 (*)    RDW 16.0 (*)    All other components within normal limits  URINALYSIS, ROUTINE W REFLEX MICROSCOPIC - Abnormal; Notable for the following:    APPearance HAZY (*)    All other components within normal limits  CBG MONITORING, ED    EKG  EKG Interpretation  Date/Time:  Friday July 04 2016 17:48:33 EDT Ventricular Rate:  67 PR Interval:  138 QRS Duration: 76 QT Interval:  418 QTC Calculation: 441 R Axis:   6 Text Interpretation:  Normal sinus rhythm Normal ECG When compared with ECG of 10/18/2010, No significant change was found Confirmed by [facility]  MD, Eduardo Wurth ([id number]) on 07/04/2016 10:53:04 PM       Radiology Mr Brain Wo Contrast  Result Date: 07/05/2016 CLINICAL DATA:  Dizziness EXAM: MRI HEAD WITHOUT CONTRAST TECHNIQUE: Multiplanar, multiecho pulse sequences of the brain and surrounding structures were obtained without intravenous contrast. COMPARISON:  Brain MRI 03/30/2015 FINDINGS: Brain: No focal diffusion restriction to indicate acute infarct. No intraparenchymal hemorrhage. There is diffuse confluent hyperintense T2-weighted signal within the periventricular, deep and subcortical white matter, most often seen in the setting of chronic microvascular ischemia. No mass lesion or midline shift. No hydrocephalus or extra-axial fluid collection. The midline structures are normal. No age advanced  or lobar predominant atrophy. Vascular: Major intracranial arterial and venous sinus flow voids are preserved. No evidence of chronic microhemorrhage or amyloid angiopathy. Skull and upper cervical spine: The visualized skull base, calvarium, upper cervical spine and extracranial soft tissues are normal. Sinuses/Orbits: No fluid levels or advanced mucosal thickening. No mastoid effusion. Normal orbits. IMPRESSION: Chronic microvascular ischemia without acute intracranial abnormality. Of Electronically Signed   By: Ulyses Jarred M.D.   On: 07/05/2016 03:49    Procedures Procedures (including critical care time)  Medications Ordered in ED Medications - No data to display   Initial Impression / Assessment and Plan / ED Course  I have reviewed the triage vital signs and the nursing notes.  Pertinent labs & imaging results that were available during my care of the patient were reviewed by me and considered in my medical decision making (see chart for details).  2 transient episodes of dizziness. No worrisome findings on physical exam. Review of old records shows prior significant anemia. He has mild anemia today and he does take iron supplements. MRI of brain in December 2016 did not show signs of prior stroke. He'll be sent for MRI of the brain. If negative, anticipate discharge with follow-up with PCP.  MRI shows no acute process. He has not had any further episodes while in the ED. He is discharged with prescription for meclizine.  Final Clinical Impressions(s) / ED Diagnoses   Final diagnoses:  Near syncope  Microcytic anemia    New Prescriptions New Prescriptions   MECLIZINE (ANTIVERT) 25 MG TABLET    Take 1 tablet (25 mg total) by mouth 3 (three) times daily as needed for dizziness.     [name], MD 00/51/10 2111

## 2016-07-04 NOTE — ED Notes (Signed)
Pt stating symptoms have improved; denies pain, lightheadedness, dizziness

## 2016-07-05 ENCOUNTER — Emergency Department (HOSPITAL_COMMUNITY): Payer: Commercial Managed Care - HMO

## 2016-07-05 DIAGNOSIS — R42 Dizziness and giddiness: Secondary | ICD-10-CM | POA: Diagnosis not present

## 2016-07-05 MED ORDER — MECLIZINE HCL 25 MG PO TABS: 25.0000 mg | ORAL_TABLET | Freq: Three times a day (TID) | ORAL | 0 refills | Status: AC | PRN

## 2016-07-05 NOTE — Discharge Instructions (Signed)
Return if you are having any problems. 

## 2016-07-09 DIAGNOSIS — H11153 Pinguecula, bilateral: Secondary | ICD-10-CM | POA: Diagnosis not present

## 2016-07-09 DIAGNOSIS — H35033 Hypertensive retinopathy, bilateral: Secondary | ICD-10-CM | POA: Diagnosis not present

## 2016-07-09 DIAGNOSIS — H11423 Conjunctival edema, bilateral: Secondary | ICD-10-CM | POA: Diagnosis not present

## 2016-07-09 DIAGNOSIS — H401113 Primary open-angle glaucoma, right eye, severe stage: Secondary | ICD-10-CM | POA: Diagnosis not present

## 2016-07-09 DIAGNOSIS — H401122 Primary open-angle glaucoma, left eye, moderate stage: Secondary | ICD-10-CM | POA: Diagnosis not present

## 2016-07-09 DIAGNOSIS — H04123 Dry eye syndrome of bilateral lacrimal glands: Secondary | ICD-10-CM | POA: Diagnosis not present

## 2016-07-09 DIAGNOSIS — E119 Type 2 diabetes mellitus without complications: Secondary | ICD-10-CM | POA: Diagnosis not present

## 2016-07-09 DIAGNOSIS — H353131 Nonexudative age-related macular degeneration, bilateral, early dry stage: Secondary | ICD-10-CM | POA: Diagnosis not present

## 2016-07-09 DIAGNOSIS — H47233 Glaucomatous optic atrophy, bilateral: Secondary | ICD-10-CM | POA: Diagnosis not present

## 2016-07-10 DIAGNOSIS — D508 Other iron deficiency anemias: Secondary | ICD-10-CM | POA: Diagnosis not present

## 2016-07-10 DIAGNOSIS — R42 Dizziness and giddiness: Secondary | ICD-10-CM | POA: Diagnosis not present

## 2016-09-12 ENCOUNTER — Telehealth: Payer: Self-pay | Admitting: Endocrinology

## 2016-09-12 NOTE — Telephone Encounter (Signed)
Patient's wife called in reference to having a missed call. Patient's wife stated there was no voice mail. Please call back and advise at [phone]. OK to leave message.

## 2016-09-16 ENCOUNTER — Other Ambulatory Visit: Payer: Commercial Managed Care - HMO

## 2016-09-16 NOTE — Telephone Encounter (Signed)
This looks to be if reference to not having anyone available to do labs on Friday, 09/19/2016. I do see that he was able to reschedule his lab appointment.

## 2016-09-19 ENCOUNTER — Ambulatory Visit: Payer: Commercial Managed Care - HMO | Admitting: Endocrinology

## 2016-09-22 ENCOUNTER — Other Ambulatory Visit: Payer: Self-pay | Admitting: Endocrinology

## 2016-09-22 ENCOUNTER — Other Ambulatory Visit (INDEPENDENT_AMBULATORY_CARE_PROVIDER_SITE_OTHER): Payer: Commercial Managed Care - HMO

## 2016-09-22 DIAGNOSIS — Z794 Long term (current) use of insulin: Secondary | ICD-10-CM

## 2016-09-22 DIAGNOSIS — E1165 Type 2 diabetes mellitus with hyperglycemia: Secondary | ICD-10-CM | POA: Diagnosis not present

## 2016-09-22 LAB — COMPREHENSIVE METABOLIC PANEL
ALBUMIN: 3.8 g/dL (ref 3.5–5.2)
ALT: 17 U/L (ref 0–53)
AST: 19 U/L (ref 0–37)
Alkaline Phosphatase: 58 U/L (ref 39–117)
BUN: 15 mg/dL (ref 6–23)
CO2: 27 mEq/L (ref 19–32)
CREATININE: 1.15 mg/dL (ref 0.40–1.50)
Calcium: 9.6 mg/dL (ref 8.4–10.5)
Chloride: 107 mEq/L (ref 96–112)
GFR: 80 mL/min (ref >60.00)
GLUCOSE: 177 mg/dL — AB (ref 70–99)
Potassium: 3.7 mEq/L (ref 3.5–5.1)
SODIUM: 141 meq/L (ref 135–145)
Total Bilirubin: 0.4 mg/dL (ref 0.2–1.2)
Total Protein: 7.6 g/dL (ref 6.0–8.3)

## 2016-09-22 LAB — HEMOGLOBIN A1C: Hgb A1c MFr Bld: 7.9 % — ABNORMAL HIGH (ref 4.6–6.5)

## 2016-09-22 LAB — LIPID PANEL
CHOL/HDL RATIO: 4
CHOLESTEROL: 137 mg/dL (ref 0–200)
HDL: 37.3 mg/dL — ABNORMAL LOW (ref >39.00)
LDL CALC: 81 mg/dL (ref 0–99)
NONHDL: 99.24
Triglycerides: 89 mg/dL (ref 0.0–149.0)
VLDL: 17.8 mg/dL (ref 0.0–40.0)

## 2016-10-23 ENCOUNTER — Telehealth: Payer: Self-pay | Admitting: Endocrinology

## 2016-10-23 NOTE — Telephone Encounter (Signed)
I am not sure why the wife canceled his appointment. Please advise.

## 2016-10-23 NOTE — Telephone Encounter (Signed)
Patient will be considered self dismissed

## 2016-10-24 ENCOUNTER — Other Ambulatory Visit: Payer: Commercial Managed Care - HMO

## 2016-10-24 NOTE — Telephone Encounter (Signed)
Pt's wife has made the statement that the pt will not be coming here anymore, please start the dismissal process for self dismissal, thank you

## 2016-10-28 ENCOUNTER — Ambulatory Visit: Payer: Commercial Managed Care - HMO | Admitting: Endocrinology

## 2016-10-28 DIAGNOSIS — H9193 Unspecified hearing loss, bilateral: Secondary | ICD-10-CM | POA: Diagnosis not present

## 2016-10-28 DIAGNOSIS — F320 Major depressive disorder, single episode, mild: Secondary | ICD-10-CM | POA: Diagnosis not present

## 2016-10-28 DIAGNOSIS — R413 Other amnesia: Secondary | ICD-10-CM | POA: Diagnosis not present

## 2016-10-28 DIAGNOSIS — D508 Other iron deficiency anemias: Secondary | ICD-10-CM | POA: Diagnosis not present

## 2016-10-28 DIAGNOSIS — I10 Essential (primary) hypertension: Secondary | ICD-10-CM | POA: Diagnosis not present

## 2016-10-28 DIAGNOSIS — E785 Hyperlipidemia, unspecified: Secondary | ICD-10-CM | POA: Diagnosis not present

## 2016-10-28 DIAGNOSIS — M109 Gout, unspecified: Secondary | ICD-10-CM | POA: Diagnosis not present

## 2016-11-11 ENCOUNTER — Other Ambulatory Visit: Payer: Self-pay | Admitting: Endocrinology

## 2016-11-17 DIAGNOSIS — Z8546 Personal history of malignant neoplasm of prostate: Secondary | ICD-10-CM | POA: Diagnosis not present

## 2016-11-19 DIAGNOSIS — E7800 Pure hypercholesterolemia, unspecified: Secondary | ICD-10-CM | POA: Diagnosis not present

## 2016-11-19 DIAGNOSIS — E1165 Type 2 diabetes mellitus with hyperglycemia: Secondary | ICD-10-CM | POA: Diagnosis not present

## 2016-11-19 DIAGNOSIS — I10 Essential (primary) hypertension: Secondary | ICD-10-CM | POA: Diagnosis not present

## 2016-11-24 DIAGNOSIS — C672 Malignant neoplasm of lateral wall of bladder: Secondary | ICD-10-CM | POA: Diagnosis not present

## 2016-11-24 DIAGNOSIS — C61 Malignant neoplasm of prostate: Secondary | ICD-10-CM | POA: Diagnosis not present

## 2016-12-08 DIAGNOSIS — H9313 Tinnitus, bilateral: Secondary | ICD-10-CM | POA: Diagnosis not present

## 2016-12-08 DIAGNOSIS — H90A22 Sensorineural hearing loss, unilateral, left ear, with restricted hearing on the contralateral side: Secondary | ICD-10-CM | POA: Diagnosis not present

## 2016-12-08 DIAGNOSIS — H90A31 Mixed conductive and sensorineural hearing loss, unilateral, right ear with restricted hearing on the contralateral side: Secondary | ICD-10-CM | POA: Diagnosis not present

## 2017-01-16 DIAGNOSIS — Z23 Encounter for immunization: Secondary | ICD-10-CM | POA: Diagnosis not present

## 2017-01-21 ENCOUNTER — Encounter: Payer: Self-pay | Admitting: Endocrinology

## 2017-01-21 ENCOUNTER — Telehealth: Payer: Self-pay | Admitting: Endocrinology

## 2017-01-29 NOTE — Telephone Encounter (Signed)
Patient dismissed from [facility] by [name] MD , effective January 21, 2017. Dismissal letter sent out by certified / registered mail.  daj

## 2017-02-09 NOTE — Telephone Encounter (Signed)
Received signed domestic return receipt verifying delivery of certified letter on January 31, 2017. Article number [phone] VNR

## 2017-06-05 DIAGNOSIS — M109 Gout, unspecified: Secondary | ICD-10-CM | POA: Diagnosis not present

## 2017-06-05 DIAGNOSIS — R413 Other amnesia: Secondary | ICD-10-CM | POA: Diagnosis not present

## 2017-06-05 DIAGNOSIS — Z136 Encounter for screening for cardiovascular disorders: Secondary | ICD-10-CM | POA: Diagnosis not present

## 2017-06-05 DIAGNOSIS — I10 Essential (primary) hypertension: Secondary | ICD-10-CM | POA: Diagnosis not present

## 2017-06-05 DIAGNOSIS — E139 Other specified diabetes mellitus without complications: Secondary | ICD-10-CM | POA: Diagnosis not present

## 2017-06-05 DIAGNOSIS — Z0000 Encounter for general adult medical examination without abnormal findings: Secondary | ICD-10-CM | POA: Diagnosis not present

## 2017-06-05 DIAGNOSIS — Z8546 Personal history of malignant neoplasm of prostate: Secondary | ICD-10-CM | POA: Diagnosis not present

## 2017-06-05 DIAGNOSIS — E785 Hyperlipidemia, unspecified: Secondary | ICD-10-CM | POA: Diagnosis not present

## 2017-10-01 ENCOUNTER — Other Ambulatory Visit: Payer: Self-pay | Admitting: Endocrinology

## 2017-10-01 ENCOUNTER — Other Ambulatory Visit: Payer: Self-pay

## 2017-10-03 ENCOUNTER — Encounter (HOSPITAL_COMMUNITY): Payer: Self-pay | Admitting: *Deleted

## 2017-10-03 ENCOUNTER — Emergency Department (HOSPITAL_COMMUNITY)
Admission: EM | Admit: 2017-10-03 | Discharge: 2017-10-03 | Disposition: A | Discharge status: Home / Self Care | Payer: Medicare HMO | Attending: Emergency Medicine | Admitting: Emergency Medicine

## 2017-10-03 DIAGNOSIS — N4889 Other specified disorders of penis: Secondary | ICD-10-CM | POA: Diagnosis not present

## 2017-10-03 DIAGNOSIS — I10 Essential (primary) hypertension: Secondary | ICD-10-CM | POA: Insufficient documentation

## 2017-10-03 DIAGNOSIS — Z87891 Personal history of nicotine dependence: Secondary | ICD-10-CM | POA: Diagnosis not present

## 2017-10-03 DIAGNOSIS — Z794 Long term (current) use of insulin: Secondary | ICD-10-CM | POA: Diagnosis not present

## 2017-10-03 DIAGNOSIS — N489 Disorder of penis, unspecified: Secondary | ICD-10-CM

## 2017-10-03 DIAGNOSIS — E119 Type 2 diabetes mellitus without complications: Secondary | ICD-10-CM | POA: Diagnosis not present

## 2017-10-03 NOTE — ED Notes (Signed)
Bed: WA08 Expected date:  Expected time:  Means of arrival:  Comments: 

## 2017-10-03 NOTE — Discharge Instructions (Signed)
You were seen in the ED with a bleeding area on the penis. This is not an infection. Apply a neosporin ointment to the area for the next 3-4 days. Call the Urologist if symptoms worsen.   Return to the ED with any sudden worsening pain, fever, chills, or other concerning symptoms.

## 2017-10-03 NOTE — ED Triage Notes (Addendum)
Pt complains of a small area that is bleeding on the glans penis since this morning. Pt denies injury, pain or hematuria.

## 2017-10-03 NOTE — ED Provider Notes (Signed)
Emergency Department Provider Note   I have reviewed the triage vital signs and the nursing notes.   HISTORY  Chief Complaint sore on penis   HPI [name] is a 77 y.o. male with PMH of DM, HLD, HTN, and sarcoidosis resents to the emergency department for evaluation of a painless lesion on the glans penis.  He noticed the lesion this morning and that there was some blood coming from the area.  He denies any pain or discharge.  Patient is not sexually active due to prostate cancer and ED. Denies any injury. No dysuria, hesitancy, or urgency. No testicle pain/swelling. No fever/chills.   Past Medical History:  Diagnosis Date  . Diabetes mellitus    insulin dependent  . Heart disease, unspecified   . Hypercholesterolemia   . Hypertension   . Sarcoidosis   . Unspecified glaucoma(365.9)     Patient Active Problem List   Diagnosis Date Noted  . Angioedema 02/01/2014  . Essential hypertension 02/01/2014  . Type 2 diabetes mellitus with diabetic nephropathy ([place]) 02/01/2014  . Hyperlipemia 02/01/2014  . Pure hypercholesterolemia 12/08/2012  . Type II or unspecified type diabetes mellitus without mention of complication, uncontrolled 12/06/2012  . Unspecified essential hypertension 12/06/2012  . Prostate cancer ([place]) 04/30/2011    Past Surgical History:  Procedure Laterality Date  . CATARACT EXTRACTION    . knee replacement surgery     right    Allergies Ace inhibitors; Allopurinol; and Aspirin  Family History  Problem Relation Age of Onset  . Hypertension Mother   . Diabetes Mellitus I Mother   . Hypertension Father   . Coronary artery disease Father   . Hypertension Sister   . Hypertension Brother   . Coronary artery disease Brother   . Diabetes Mellitus I Brother   . Diabetes Mellitus I Brother   . Cancer Neg Hx     Social History Social History   Tobacco Use  . Smoking status: Former Smoker    Packs/day: 0.50    Types: Cigarettes    Last attempt  to quit: 04/21/2005    Years since quitting: 12.4  . Smokeless tobacco: Never Used  Substance Use Topics  . Alcohol use: No  . Drug use: No    Review of Systems  Constitutional: No fever/chills Eyes: No visual changes. ENT: No sore throat. Cardiovascular: Denies chest pain. Respiratory: Denies shortness of breath. Gastrointestinal: No abdominal pain.  No nausea, no vomiting.  No diarrhea.  No constipation. Genitourinary: Negative for dysuria. Positive penis lesion.  Musculoskeletal: Negative for back pain. Skin: Negative for rash. Neurological: Negative for headaches, focal weakness or numbness.  10-point ROS otherwise negative.  ____________________________________________   PHYSICAL EXAM:  VITAL SIGNS: ED Triage Vitals  Enc Vitals Group     BP 10/03/17 1211 (!) 150/64     Pulse Rate 10/03/17 1211 88     Resp 10/03/17 1211 18     Temp 10/03/17 1211 98.2 F (36.8 C)     Temp Source 10/03/17 1211 Oral     SpO2 10/03/17 1211 94 %     Pain Score 10/03/17 1221 0   Constitutional: Alert and oriented.  Eyes: Conjunctivae are normal. Head: Atraumatic. Nose: No congestion/rhinnorhea. Mouth/Throat: Mucous membranes are moist. Neck: No stridor. Cardiovascular: Good peripheral circulation. Respiratory: Normal respiratory effort.  Gastrointestinal: Soft and nontender. No distention.  Genitourinary: 2 mm area over the glans with no ulceration or cellulitis. No tenderness. No active bleeding.  Musculoskeletal: No lower extremity tenderness  nor edema. No gross deformities of extremities. Neurologic:  Normal speech and language. No gross focal neurologic deficits are appreciated.  Skin:  Skin is warm, dry and intact. No rash noted.  ____________________________________________   PROCEDURES  Procedure(s) performed:   Procedures  None ____________________________________________   INITIAL IMPRESSION / ASSESSMENT AND PLAN / ED COURSE  Pertinent labs & imaging results  that were available during my care of the patient were reviewed by me and considered in my medical decision making (see chart for details).   Patient presents to the emergency department for evaluation of painless lesion on the penis.  Patient is not sexually active.  Patient is not consistent with primary syphilis. Appears as if there is a friction type injury.  No sign of infection. Plan for barrier ointment and Urology follow up.   At this time, I do not feel there is any life-threatening condition present. I have reviewed and discussed all results (EKG, imaging, lab, urine as appropriate), exam findings with patient. I have reviewed nursing notes and appropriate previous records.  I feel the patient is safe to be discharged home without further emergent workup. Discussed usual and customary return precautions. Patient and family (if present) verbalize understanding and are comfortable with this plan.  Patient will follow-up with their primary care provider. If they do not have a primary care provider, information for follow-up has been provided to them. All questions have been answered.  ____________________________________________  FINAL CLINICAL IMPRESSION(S) / ED DIAGNOSES  Final diagnoses:  Lesion of penis    Note:  This document was prepared using Dragon voice recognition software and may include unintentional dictation errors.  Nanda Quinton, MD Emergency Medicine    Madgeline Rayo, [facility], MD 10/03/17 [phone]

## 2017-10-26 ENCOUNTER — Inpatient Hospital Stay (HOSPITAL_COMMUNITY)
Admission: EM | Admit: 2017-10-26 | Discharge: 2017-10-30 | DRG: 554 | Disposition: A | Discharge status: Home Health | Payer: Medicare HMO | Attending: Family Medicine | Admitting: Family Medicine

## 2017-10-26 ENCOUNTER — Emergency Department (HOSPITAL_COMMUNITY): Payer: Medicare HMO

## 2017-10-26 ENCOUNTER — Encounter (HOSPITAL_COMMUNITY): Payer: Self-pay | Admitting: Emergency Medicine

## 2017-10-26 ENCOUNTER — Other Ambulatory Visit: Payer: Self-pay

## 2017-10-26 DIAGNOSIS — Z8546 Personal history of malignant neoplasm of prostate: Secondary | ICD-10-CM

## 2017-10-26 DIAGNOSIS — M25462 Effusion, left knee: Secondary | ICD-10-CM

## 2017-10-26 DIAGNOSIS — R350 Frequency of micturition: Secondary | ICD-10-CM | POA: Diagnosis not present

## 2017-10-26 DIAGNOSIS — M064 Inflammatory polyarthropathy: Secondary | ICD-10-CM | POA: Diagnosis not present

## 2017-10-26 DIAGNOSIS — R5381 Other malaise: Secondary | ICD-10-CM | POA: Diagnosis present

## 2017-10-26 DIAGNOSIS — R05 Cough: Secondary | ICD-10-CM | POA: Diagnosis not present

## 2017-10-26 DIAGNOSIS — R590 Localized enlarged lymph nodes: Secondary | ICD-10-CM | POA: Diagnosis present

## 2017-10-26 DIAGNOSIS — R531 Weakness: Secondary | ICD-10-CM | POA: Diagnosis not present

## 2017-10-26 DIAGNOSIS — Z96651 Presence of right artificial knee joint: Secondary | ICD-10-CM | POA: Diagnosis not present

## 2017-10-26 DIAGNOSIS — M353 Polymyalgia rheumatica: Secondary | ICD-10-CM | POA: Diagnosis present

## 2017-10-26 DIAGNOSIS — Z8709 Personal history of other diseases of the respiratory system: Secondary | ICD-10-CM | POA: Diagnosis not present

## 2017-10-26 DIAGNOSIS — Z888 Allergy status to other drugs, medicaments and biological substances status: Secondary | ICD-10-CM | POA: Diagnosis not present

## 2017-10-26 DIAGNOSIS — M2550 Pain in unspecified joint: Secondary | ICD-10-CM | POA: Diagnosis not present

## 2017-10-26 DIAGNOSIS — I499 Cardiac arrhythmia, unspecified: Secondary | ICD-10-CM | POA: Diagnosis not present

## 2017-10-26 DIAGNOSIS — M130 Polyarthritis, unspecified: Secondary | ICD-10-CM | POA: Diagnosis not present

## 2017-10-26 DIAGNOSIS — Z8551 Personal history of malignant neoplasm of bladder: Secondary | ICD-10-CM | POA: Diagnosis not present

## 2017-10-26 DIAGNOSIS — M109 Gout, unspecified: Secondary | ICD-10-CM | POA: Diagnosis present

## 2017-10-26 DIAGNOSIS — M25511 Pain in right shoulder: Secondary | ICD-10-CM | POA: Diagnosis not present

## 2017-10-26 DIAGNOSIS — Z87891 Personal history of nicotine dependence: Secondary | ICD-10-CM | POA: Diagnosis not present

## 2017-10-26 DIAGNOSIS — R509 Fever, unspecified: Secondary | ICD-10-CM | POA: Diagnosis present

## 2017-10-26 DIAGNOSIS — E785 Hyperlipidemia, unspecified: Secondary | ICD-10-CM | POA: Diagnosis not present

## 2017-10-26 DIAGNOSIS — R262 Difficulty in walking, not elsewhere classified: Secondary | ICD-10-CM | POA: Diagnosis not present

## 2017-10-26 DIAGNOSIS — Z833 Family history of diabetes mellitus: Secondary | ICD-10-CM

## 2017-10-26 DIAGNOSIS — I700 Atherosclerosis of aorta: Secondary | ICD-10-CM | POA: Diagnosis not present

## 2017-10-26 DIAGNOSIS — C679 Malignant neoplasm of bladder, unspecified: Secondary | ICD-10-CM | POA: Insufficient documentation

## 2017-10-26 DIAGNOSIS — E1121 Type 2 diabetes mellitus with diabetic nephropathy: Secondary | ICD-10-CM | POA: Diagnosis present

## 2017-10-26 DIAGNOSIS — R0602 Shortness of breath: Secondary | ICD-10-CM | POA: Diagnosis not present

## 2017-10-26 DIAGNOSIS — Z794 Long term (current) use of insulin: Secondary | ICD-10-CM | POA: Diagnosis not present

## 2017-10-26 DIAGNOSIS — R197 Diarrhea, unspecified: Secondary | ICD-10-CM | POA: Diagnosis not present

## 2017-10-26 DIAGNOSIS — F0390 Unspecified dementia without behavioral disturbance: Secondary | ICD-10-CM | POA: Diagnosis not present

## 2017-10-26 DIAGNOSIS — Z8249 Family history of ischemic heart disease and other diseases of the circulatory system: Secondary | ICD-10-CM | POA: Diagnosis not present

## 2017-10-26 DIAGNOSIS — E1165 Type 2 diabetes mellitus with hyperglycemia: Secondary | ICD-10-CM | POA: Diagnosis not present

## 2017-10-26 DIAGNOSIS — M7989 Other specified soft tissue disorders: Secondary | ICD-10-CM | POA: Diagnosis not present

## 2017-10-26 DIAGNOSIS — I10 Essential (primary) hypertension: Secondary | ICD-10-CM | POA: Diagnosis present

## 2017-10-26 DIAGNOSIS — Z7982 Long term (current) use of aspirin: Secondary | ICD-10-CM

## 2017-10-26 DIAGNOSIS — D72819 Decreased white blood cell count, unspecified: Secondary | ICD-10-CM | POA: Diagnosis present

## 2017-10-26 DIAGNOSIS — M6281 Muscle weakness (generalized): Secondary | ICD-10-CM | POA: Diagnosis not present

## 2017-10-26 LAB — CBC WITH DIFFERENTIAL/PLATELET
Abs Immature Granulocytes: 0 10*3/uL (ref 0.0–0.1)
BASOS ABS: 0 10*3/uL (ref 0.0–0.1)
Basophils Relative: 0 %
EOS PCT: 0 %
Eosinophils Absolute: 0 10*3/uL (ref 0.0–0.7)
HCT: 36.5 % — ABNORMAL LOW (ref 39.0–52.0)
HEMOGLOBIN: 12 g/dL — AB (ref 13.0–17.0)
Immature Granulocytes: 1 %
LYMPHS PCT: 29 %
Lymphs Abs: 1.1 10*3/uL (ref 0.7–4.0)
MCH: 25.5 pg — ABNORMAL LOW (ref 26.0–34.0)
MCHC: 32.9 g/dL (ref 30.0–36.0)
MCV: 77.7 fL — ABNORMAL LOW (ref 78.0–100.0)
Monocytes Absolute: 0.4 10*3/uL (ref 0.1–1.0)
Monocytes Relative: 11 %
NEUTROS ABS: 2.2 10*3/uL (ref 1.7–7.7)
NEUTROS PCT: 59 %
Platelets: 165 10*3/uL (ref 150–400)
RBC: 4.7 MIL/uL (ref 4.22–5.81)
RDW: 15.9 % — ABNORMAL HIGH (ref 11.5–15.5)
WBC: 3.8 10*3/uL — AB (ref 4.0–10.5)

## 2017-10-26 LAB — URINALYSIS, ROUTINE W REFLEX MICROSCOPIC
Bacteria, UA: NONE SEEN
Bilirubin Urine: NEGATIVE
HGB URINE DIPSTICK: NEGATIVE
KETONES UR: NEGATIVE mg/dL
Leukocytes, UA: NEGATIVE
Nitrite: NEGATIVE
PH: 5 (ref 5.0–8.0)
Protein, ur: 30 mg/dL — AB
Specific Gravity, Urine: 1.012 (ref 1.005–1.030)

## 2017-10-26 LAB — CBG MONITORING, ED: GLUCOSE-CAPILLARY: 296 mg/dL — AB (ref 70–99)

## 2017-10-26 LAB — CK: CK TOTAL: 454 U/L — AB (ref 49–397)

## 2017-10-26 LAB — BASIC METABOLIC PANEL
ANION GAP: 5 (ref 5–15)
BUN: 13 mg/dL (ref 8–23)
CO2: 29 mmol/L (ref 22–32)
Calcium: 9.2 mg/dL (ref 8.9–10.3)
Chloride: 106 mmol/L (ref 98–111)
Creatinine, Ser: 1.24 mg/dL (ref 0.61–1.24)
GFR calc Af Amer: >60 mL/min (ref >60)
GFR, EST NON AFRICAN AMERICAN: 56 mL/min — AB (ref >60)
Glucose, Bld: 272 mg/dL — ABNORMAL HIGH (ref 70–99)
POTASSIUM: 4.3 mmol/L (ref 3.5–5.1)
SODIUM: 140 mmol/L (ref 135–145)

## 2017-10-26 LAB — GRAM STAIN

## 2017-10-26 LAB — HEPATIC FUNCTION PANEL
ALT: 29 U/L (ref 0–44)
AST: 37 U/L (ref 15–41)
Albumin: 2.8 g/dL — ABNORMAL LOW (ref 3.5–5.0)
Alkaline Phosphatase: 57 U/L (ref 38–126)
Bilirubin, Direct: 0.1 mg/dL (ref 0.0–0.2)
Indirect Bilirubin: 0.5 mg/dL (ref 0.3–0.9)
Total Bilirubin: 0.6 mg/dL (ref 0.3–1.2)
Total Protein: 6.7 g/dL (ref 6.5–8.1)

## 2017-10-26 LAB — GLUCOSE, CAPILLARY: Glucose-Capillary: 275 mg/dL — ABNORMAL HIGH (ref 70–99)

## 2017-10-26 LAB — I-STAT CG4 LACTIC ACID, ED: LACTIC ACID, VENOUS: 1.83 mmol/L (ref 0.5–1.9)

## 2017-10-26 LAB — HEMOGLOBIN A1C
Hgb A1c MFr Bld: 10 % — ABNORMAL HIGH (ref 4.8–5.6)
Mean Plasma Glucose: 240.3 mg/dL

## 2017-10-26 LAB — SYNOVIAL CELL COUNT + DIFF, W/ CRYSTALS
CRYSTALS FLUID: NONE SEEN
LYMPHOCYTES-SYNOVIAL FLD: 26 % — AB (ref 0–20)
MONOCYTE-MACROPHAGE-SYNOVIAL FLUID: 71 % (ref 50–90)
NEUTROPHIL, SYNOVIAL: 3 % (ref 0–25)
WBC, Synovial: 8400 /mm3 — ABNORMAL HIGH (ref 0–200)

## 2017-10-26 LAB — SEDIMENTATION RATE: Sed Rate: 23 mm/hr — ABNORMAL HIGH (ref 0–16)

## 2017-10-26 MED ORDER — SODIUM CHLORIDE 0.9 % IV BOLUS
1000.0000 mL | Freq: Once | INTRAVENOUS | Status: AC
  Administered 2017-10-26: 1000 mL via INTRAVENOUS

## 2017-10-26 MED ORDER — METFORMIN HCL 500 MG PO TABS
1000.0000 mg | ORAL_TABLET | Freq: Every day | ORAL | Status: DC
  Administered 2017-10-26: 1000 mg via ORAL
  Filled 2017-10-26: qty 2

## 2017-10-26 MED ORDER — OMEGA-3-ACID ETHYL ESTERS 1 G PO CAPS
1.0000 g | ORAL_CAPSULE | Freq: Every day | ORAL | Status: DC
  Administered 2017-10-26 – 2017-10-30 (×5): 1 g via ORAL
  Filled 2017-10-26 (×5): qty 1

## 2017-10-26 MED ORDER — POTASSIUM CHLORIDE CRYS ER 20 MEQ PO TBCR
20.0000 meq | EXTENDED_RELEASE_TABLET | Freq: Every day | ORAL | Status: DC
  Administered 2017-10-26 – 2017-10-30 (×5): 20 meq via ORAL
  Filled 2017-10-26 (×5): qty 1

## 2017-10-26 MED ORDER — VANCOMYCIN HCL IN DEXTROSE 750-5 MG/150ML-% IV SOLN
750.0000 mg | Freq: Two times a day (BID) | INTRAVENOUS | Status: DC
  Administered 2017-10-26 – 2017-10-28 (×4): 750 mg via INTRAVENOUS
  Filled 2017-10-26 (×5): qty 150

## 2017-10-26 MED ORDER — ACETAMINOPHEN 325 MG PO TABS
650.0000 mg | ORAL_TABLET | Freq: Once | ORAL | Status: AC
  Administered 2017-10-26: 650 mg via ORAL
  Filled 2017-10-26: qty 2

## 2017-10-26 MED ORDER — INSULIN ASPART 100 UNIT/ML ~~LOC~~ SOLN
0.0000 [IU] | Freq: Three times a day (TID) | SUBCUTANEOUS | Status: DC
  Administered 2017-10-27 (×2): 3 [IU] via SUBCUTANEOUS
  Administered 2017-10-27: 5 [IU] via SUBCUTANEOUS
  Administered 2017-10-28 – 2017-10-29 (×3): 2 [IU] via SUBCUTANEOUS

## 2017-10-26 MED ORDER — ESCITALOPRAM OXALATE 10 MG PO TABS
5.0000 mg | ORAL_TABLET | Freq: Every day | ORAL | Status: DC
  Administered 2017-10-26 – 2017-10-29 (×4): 5 mg via ORAL
  Filled 2017-10-26 (×4): qty 1

## 2017-10-26 MED ORDER — AMLODIPINE BESYLATE 5 MG PO TABS
10.0000 mg | ORAL_TABLET | Freq: Every day | ORAL | Status: DC
  Administered 2017-10-27 – 2017-10-30 (×4): 10 mg via ORAL
  Filled 2017-10-26 (×4): qty 2

## 2017-10-26 MED ORDER — SODIUM CHLORIDE 0.9 % IV SOLN
INTRAVENOUS | Status: DC
  Administered 2017-10-26 – 2017-10-28 (×3): via INTRAVENOUS

## 2017-10-26 MED ORDER — LATANOPROST 0.005 % OP SOLN
1.0000 [drp] | Freq: Every day | OPHTHALMIC | Status: DC
  Administered 2017-10-26 – 2017-10-29 (×4): 1 [drp] via OPHTHALMIC
  Filled 2017-10-26: qty 2.5

## 2017-10-26 MED ORDER — TAMSULOSIN HCL 0.4 MG PO CAPS
0.4000 mg | ORAL_CAPSULE | Freq: Every day | ORAL | Status: DC
  Administered 2017-10-26 – 2017-10-29 (×4): 0.4 mg via ORAL
  Filled 2017-10-26 (×4): qty 1

## 2017-10-26 MED ORDER — DONEPEZIL HCL 10 MG PO TABS
10.0000 mg | ORAL_TABLET | Freq: Every day | ORAL | Status: DC
  Administered 2017-10-26 – 2017-10-29 (×4): 10 mg via ORAL
  Filled 2017-10-26 (×4): qty 1

## 2017-10-26 MED ORDER — METOPROLOL TARTRATE 25 MG PO TABS
25.0000 mg | ORAL_TABLET | Freq: Two times a day (BID) | ORAL | Status: DC
  Administered 2017-10-27 – 2017-10-30 (×7): 25 mg via ORAL
  Filled 2017-10-26 (×7): qty 1

## 2017-10-26 MED ORDER — ORAL CARE MOUTH RINSE
15.0000 mL | Freq: Two times a day (BID) | OROMUCOSAL | Status: DC
  Administered 2017-10-26 – 2017-10-30 (×8): 15 mL via OROMUCOSAL

## 2017-10-26 MED ORDER — SODIUM CHLORIDE 0.9 % IV SOLN
2.0000 g | Freq: Once | INTRAVENOUS | Status: AC
  Administered 2017-10-26: 2 g via INTRAVENOUS
  Filled 2017-10-26: qty 20

## 2017-10-26 MED ORDER — OMEGA-3 FATTY ACIDS 1000 MG PO CAPS: 1.0000 g | ORAL_CAPSULE | Freq: Every day | ORAL | Status: DC

## 2017-10-26 MED ORDER — LIDOCAINE-EPINEPHRINE (PF) 2 %-1:200000 IJ SOLN
10.0000 mL | Freq: Once | INTRAMUSCULAR | Status: AC
  Administered 2017-10-26: 10 mL
  Filled 2017-10-26: qty 20

## 2017-10-26 MED ORDER — PREDNISOLONE ACETATE 1 % OP SUSP
1.0000 [drp] | Freq: Every day | OPHTHALMIC | Status: DC
  Administered 2017-10-26 – 2017-10-30 (×5): 1 [drp] via OPHTHALMIC
  Filled 2017-10-26: qty 5

## 2017-10-26 MED ORDER — DORZOLAMIDE HCL-TIMOLOL MAL 2-0.5 % OP SOLN
2.0000 [drp] | Freq: Two times a day (BID) | OPHTHALMIC | Status: DC
  Administered 2017-10-26 – 2017-10-30 (×8): 2 [drp] via OPHTHALMIC
  Filled 2017-10-26: qty 10

## 2017-10-26 MED ORDER — ENOXAPARIN SODIUM 40 MG/0.4ML ~~LOC~~ SOLN
40.0000 mg | SUBCUTANEOUS | Status: DC
  Administered 2017-10-26 – 2017-10-29 (×4): 40 mg via SUBCUTANEOUS
  Filled 2017-10-26 (×4): qty 0.4

## 2017-10-26 MED ORDER — HYPROMELLOSE (GONIOSCOPIC) 2.5 % OP SOLN
1.0000 [drp] | Freq: Four times a day (QID) | OPHTHALMIC | Status: DC | PRN
  Filled 2017-10-26: qty 15

## 2017-10-26 MED ORDER — ASPIRIN 81 MG PO CHEW
81.0000 mg | CHEWABLE_TABLET | Freq: Every day | ORAL | Status: DC
  Administered 2017-10-26 – 2017-10-29 (×4): 81 mg via ORAL
  Filled 2017-10-26 (×4): qty 1

## 2017-10-26 MED ORDER — SIMVASTATIN 20 MG PO TABS
20.0000 mg | ORAL_TABLET | Freq: Every day | ORAL | Status: DC
  Administered 2017-10-27 – 2017-10-29 (×3): 20 mg via ORAL
  Filled 2017-10-26 (×3): qty 1

## 2017-10-26 MED ORDER — DOXYCYCLINE HYCLATE 100 MG PO TABS
100.0000 mg | ORAL_TABLET | Freq: Two times a day (BID) | ORAL | Status: DC
  Administered 2017-10-26 – 2017-10-30 (×8): 100 mg via ORAL
  Filled 2017-10-26 (×8): qty 1

## 2017-10-26 MED ORDER — METFORMIN HCL 500 MG PO TABS
500.0000 mg | ORAL_TABLET | Freq: Every day | ORAL | Status: DC
  Administered 2017-10-27: 500 mg via ORAL
  Filled 2017-10-26: qty 1

## 2017-10-26 MED ORDER — INSULIN ASPART 100 UNIT/ML ~~LOC~~ SOLN
0.0000 [IU] | Freq: Every day | SUBCUTANEOUS | Status: DC
  Administered 2017-10-26: 3 [IU] via SUBCUTANEOUS

## 2017-10-26 MED ORDER — SODIUM CHLORIDE 0.9 % IV SOLN
2.0000 g | INTRAVENOUS | Status: DC
  Administered 2017-10-27 – 2017-10-28 (×2): 2 g via INTRAVENOUS
  Filled 2017-10-26 (×2): qty 20

## 2017-10-26 NOTE — ED Notes (Signed)
Patient transported to X-ray 

## 2017-10-26 NOTE — ED Notes (Addendum)
Pt in x-ray. Will return to draw bloodwork

## 2017-10-26 NOTE — H&P (Addendum)
History and Physical    Kota Ciancio EPP:[id number] DOB: 06/27/1940 DOA: 10/26/2017  PCP: [name], MD Consultants:  None Patient coming from:  Home - lives with wife  Chief Complaint: Joint pain  HPI: [name] is a 77 y.o. male with medical history significant of HTN, hyperlipidemia, prostate CA, bladder CA, possible sarcoidosis who presented to the ED with c/o 5 days of diffuse joint pain, BUE and BLE weakness. He was in his usual state of health until Thursday 7/4 when he traveled to [facility] for a family reunion. States that same day he began having diffuse weakness in his arms and legs, and was hardly able to walk. He also developed diffuse joint pain, in both shoulders, elbows, wrists, hands, fingers, hips, and knees, although denies pain in his ankles and feet. He has been sleeping more and has become unable to walk in the last day or 2 due to the weakness and pain. He reports having chills since Thursday and subjective fevers. He could not tell me exactly what he ate at the reunion but denies eating anything with mayonnaise that had been left out, or raw or undercooked meat/seafood. He has had no sick contacts. He has had no travel out of the country. He reports no tick bites or other bug bites but ED physician reported family saw tick(s) in his car recently. He reports increase in urinary frequency up to once every 30-40 minutes. Denies dysuria, abd pain, N/V/D. No headache or photophobia or meningismus. Has had mild nonproductive cough over the past several days. No chest pain. No dyspnea. He does think that he started having some improvement today in his symptoms.  He has a h/o gout, in his feet. Has not had any flares recently. Was diagnosed with possible sarcoidosis in 2008; most recent chest CT was 10/12/2006 that showed stable mediastinal LAD, recommended followup which does not appear to have been done.   ED Course:  VSS, febrile at 101.5. Gluc 300. R Knee tapped --> 8500 WBC. Gram  stain neg. Lyme titers sent. Cultures sent. Septic joint abx started- rocephin and vanc. No target rash.   Review of Systems: As per HPI; otherwise review of systems reviewed and negative.   Ambulatory Status:  Ambulates without assistance normally; however nonambulatory due to present illness  Past Medical History:  Diagnosis Date  . Diabetes mellitus    insulin dependent  . Heart disease, unspecified   . Hypercholesterolemia   . Hypertension   . Sarcoidosis   . Unspecified glaucoma(365.9)     Past Surgical History:  Procedure Laterality Date  . CATARACT EXTRACTION    . knee replacement surgery     right    Social History   Socioeconomic History  . Marital status: Married    Spouse name: Not on file  . Number of children: Not on file  . Years of education: Not on file  . Highest education level: Not on file  Occupational History  . Not on file  Social Needs  . Financial resource strain: Not on file  . Food insecurity:    Worry: Not on file    Inability: Not on file  . Transportation needs:    Medical: Not on file    Non-medical: Not on file  Tobacco Use  . Smoking status: Former Smoker    Packs/day: 0.50    Types: Cigarettes    Last attempt to quit: 04/21/2005    Years since quitting: 12.5  . Smokeless tobacco: Never Used  Substance and Sexual Activity  . Alcohol use: No  . Drug use: No  . Sexual activity: Not on file  Lifestyle  . Physical activity:    Days per week: Not on file    Minutes per session: Not on file  . Stress: Not on file  Relationships  . Social connections:    Talks on phone: Not on file    Gets together: Not on file    Attends religious service: Not on file    Active member of club or organization: Not on file    Attends meetings of clubs or organizations: Not on file    Relationship status: Not on file  . Intimate partner violence:    Fear of current or ex partner: Not on file    Emotionally abused: Not on file    Physically  abused: Not on file    Forced sexual activity: Not on file  Other Topics Concern  . Not on file  Social History Narrative  . Not on file    Allergies  Allergen Reactions  . Ace Inhibitors Other (See Comments)    angioedema  . Allopurinol Rash  . Aspirin     Unknown reaction     Family History  Problem Relation Age of Onset  . Hypertension Mother   . Diabetes Mellitus I Mother   . Hypertension Father   . Coronary artery disease Father   . Hypertension Sister   . Hypertension Brother   . Coronary artery disease Brother   . Diabetes Mellitus I Brother   . Diabetes Mellitus I Brother   . Cancer Neg Hx     Prior to Admission medications   Medication Sig Start Date End Date Taking? Authorizing Provider  ACCU-CHEK AVIVA PLUS test strip USE AS INSTRUCTED TO CHECK BLOOD SUGAR THREE TIMES DAILY 11/11/16   [name], MD  ACCU-CHEK SOFTCLIX LANCETS lancets by Other route. Use as instructed    [provider]  amLODipine (NORVASC) 10 MG tablet Take 10 mg by mouth daily.    [provider]  aspirin 81 MG tablet Take 81 mg by mouth daily.    [provider]  Blood Glucose Monitoring Suppl (ACCU-CHEK AVIVA PLUS) w/Device KIT Use to check blood sugar 3 times per day dx code E11.65 03/12/16   [name], MD  donepezil (ARICEPT) 5 MG tablet Take 5 mg by mouth at bedtime.  03/22/15   [provider]  dorzolamide-timolol (COSOPT) 22.3-6.8 MG/ML ophthalmic solution 1 drop as directed.  10/13/12   [provider]  ferrous fumarate (HEMOCYTE - 106 MG FE) 325 (106 FE) MG TABS Take 1 tablet by mouth 3 (three) times daily.    [provider]  fish oil-omega-3 fatty acids 1000 MG capsule Take 2 g by mouth daily.      [provider]  hydroxypropyl methylcellulose / hypromellose (ISOPTO TEARS / GONIOVISC) 2.5 % ophthalmic solution Place 1 drop into both eyes as directed.    [provider]  Insulin Detemir (LEVEMIR FLEXTOUCH) 100  UNIT/ML Pen Inject 38 units daily 11/02/15   [name], MD  insulin regular (NOVOLIN R RELION) 250 units/2.15m (100 units/mL) injection INJECT 8 UNITS DAILY BEFORE DINNER 06/02/16   KElayne Snare MD  Insulin Syringe-Needle U-100 (INSULIN SYRINGE .5CC/31GX5/16") 31G X 5/16" 0.5 ML MISC Use one to inject insulin daily 05/02/15   KElayne Snare MD  latanoprost (XALATAN) 0.005 % ophthalmic solution  02/17/14   [provider]  Liraglutide ([place] 18  MG/3ML SOPN Inject 0.2 mLs (1.2 mg total) into the skin daily. 11/02/15   [name], MD  meclizine (ANTIVERT) 25 MG tablet Take 1 tablet (25 mg total) by mouth 3 (three) times daily as needed for dizziness. 10/20/75   [name], MD  metFORMIN (GLUCOPHAGE) 1000 MG tablet TAKE 1/2 TABLET EACH MORNING AND 1 TABLET AT [facility] 06/13/16   [name], MD  metoprolol tartrate (LOPRESSOR) 25 MG tablet Take 25 mg by mouth 2 (two) times daily.  04/04/14   [provider]  potassium chloride SA (K-DUR,KLOR-CON) 20 MEQ tablet TAKE 1 TABLET EVERY DAY 11/07/15   Renato Shin, MD  simvastatin (ZOCOR) 20 MG tablet Take 20 mg by mouth daily.    [provider]  Tamsulosin HCl (FLOMAX) 0.4 MG CAPS Take by mouth.      [provider]    Physical Exam: Vitals:   10/26/17 1515 10/26/17 1530 10/26/17 1600 10/26/17 1615  BP: 133/62 134/67 (!) 137/55 124/71  Pulse: 68 71 (!) 59 66  Resp: (!) 23 (!) 21 20 19   Temp:      TempSrc:      SpO2: 98% 96% 99% 100%     . General:  Appears calm and comfortable and is NAD . Eyes:  PERRL, EOMI, normal lids, iris . ENT:  Mildly HOH, lips & tongue, mmm; appropriate dentition . Neck: neck is full but no discrete parotid swelling or palpable LAD, masses or thyromegaly; no meningismus, no carotid bruits . Cardiovascular:  RRR, no M. No LE edema.  Marland Kitchen Respiratory:   CTA bilaterally. Normal respiratory effort. . Abdomen:  soft, NT, ND, NABS . Back: normal alignment, no CVAT . Skin:  no rash or  induration, no petechiae or purpura . Musculoskeletal:  grossly normal tone BUE/BLE. Decreased ROM bil shoulders, able to abduct almost 90 degrees against gravity but with significant pain. L knee swelling with slight warmth, pain with palpation. R knee with well healed arthoplasty scar.  . Lower extremity:  No LE edema.  Limited foot exam with no ulcerations.  2+ distal pulses. Marland Kitchen Psychiatric:  grossly normal mood and affect, speech fluent and appropriate, AOx3 . Neurologic:  CN 2-12 grossly intact, moves all extremities in coordinated fashion, sensation intact. He does have a mild action tremor in L hand. No rigidity or clonus.   Radiological Exams on Admission: Dg Chest 2 View  Result Date: 10/26/2017 CLINICAL DATA:  77 y/o  M; cough and shortness of breath. EXAM: CHEST - 2 VIEW COMPARISON:  12/26/2010 chest radiograph. FINDINGS: Stable cardiac silhouette given projection and technique. Aortic atherosclerosis with calcification. Pulmonary venous hypertension. Streaky opacity in left lung base, probably atelectasis. No pleural effusion or pneumothorax. No acute osseous abnormality is evident. IMPRESSION: Pulmonary venous hypertension. Left basilar atelectasis. Aortic atherosclerosis. Electronically Signed   By: Kristine Garbe M.D.   On: 10/26/2017 13:44   Dg Shoulder Right  Result Date: 10/26/2017 CLINICAL DATA:  No sure if pt fell,very confused,,frozen rt shoulder unable to move both arms up ,,but he complained of top of rt shoulder,, Lt knee swelling ,,and sob,, EXAM: RIGHT SHOULDER - 2+ VIEW COMPARISON:  None. FINDINGS: Glenohumeral joint is intact. No evidence of scapular fracture or humeral fracture. The acromioclavicular joint is intact. There is narrowing of the joint space superiorly. IMPRESSION: 1.  No fracture or dislocation. 2. Narrowing of the joint space superiorly could indicate chronic rotator cuff degeneration. Electronically Signed   By: Suzy Bouchard M.D.   On: 10/26/2017  13:41   Dg Knee Complete 4 Views Left  Result Date: 10/26/2017 CLINICAL DATA:  Atraumatic knee pain and swelling EXAM: LEFT KNEE - COMPLETE 4+ VIEW COMPARISON:  None FINDINGS: Diffuse osseous demineralization. Tiny marginal spurs at medial compartment. Joint spaces fairly well preserved. Large joint effusion. No acute fracture, dislocation, or bone destruction. Scattered atherosclerotic calcifications. IMPRESSION: Large LEFT knee joint effusion without acute bony abnormalities. Electronically Signed   By: Lavonia Dana M.D.   On: 10/26/2017 13:47    EKG: Independently reviewed. NSR, rate 80 with no ST-T wave changes or other evidence of acute ischemia   Labs on Admission: I have personally reviewed the available labs and imaging studies at the time of the admission.  Pertinent labs:  Gluc 272 Creat 1.24 (baseline 1.15) BUN 13 CK 454 Lactic acid 1.83 WBC 3.8, normal diff Hgb 12 Plt 165  U/A with >=500 glucose, protein 30, o/w normal Urine gram stain neg, WBC 0-5  L knee synovial fluid straw, cloudy, no crystals. WBC 8400, PMN 3, lymphocytes 26, mono-mac 83   Assessment/Plan Principal Problem:   Polyarthralgia Active Problems:   Essential hypertension   Type 2 diabetes mellitus with diabetic nephropathy (HCC)   Hyperlipemia   Fever, unknown origin   Mediastinal lymphadenopathy   Effusion of left knee joint  Dementia  Inflammatory arthritis, fever, generalized weakness: it is unclear what is causing his L knee arthritis and effusion and whether he has any inflammatory arthritis elsewhere vs just polyarthralgia. Certainly with a WBC of only 8400 makes bacterial causes much less likely. No crystals to suggest gout or pseudogout. Presence of fever however argues for infection. Possible viral polyarthritis. Also cannot rule out sarcoid arthritis given his h/o likely sarcoidosis; however would be somewhat unusual to present with arthritis due to sarcoid at his age and sarcoid arthritis  usually involves ankles, which are one of the few joints pt has no pain or swelling in. Feel viral cause most likely at this time, which is also supported by leukopenia. Given the time of year and known exposure to ticks, as well as fever and leukopenia, tick-borne illness cannot be ruled out. He could also have erosive osteoarthritis with an effusion which is unrelated to his current presentation. -f/u culture of synovial fluid -cont vanc and rocephin for now -send hepatic function panel and if abnormal, check viral hepatitis panel -check sed rate -check parvo IgG and IgM -sent B. burgdorfi antibodies. Will also order Ehrlichia and RMSF.  -empirically treat with doxy 100 mg BID until tick-borne illness reasonably ruled out. -consider ID consult in a.m.   HTN -cont home meds metoprolol and norvasc -hold BP meds if begins to look septic or drops pressure  HLD -cont home statin  T2DM -reports he is only on MTF and SSI at home; no basal -check HbA1C -SSI -cont metformin unless pt will be receiving IV contrast -cont ASA 81 mg daily -glucosuria and urinary frequency suggest suboptimal control, at least in the short-term. Pt may need addition of basal insulin; he is unsure why detemir was discontinued. Cont to monitor CBG and titrate insulin as needed.  H/o possible sarcoidosis -pt overdue for CT chest; can defer to outpatient setting unless clinical course calls for it to be done in-house; if so, stop metformin  H/o dementia -cont aricept, SSRI  Urinary Frequency -suspect this is due to glucosuria. See above (T2DM)  DVT prophylaxis:  Lovenox Code Status:  Full - unable to reach family to confirm; pt seemed to not  be sure what his wishes are  Disposition Plan: TBD once clinically improved Consults called: none  Admission status: Admit - It is my clinical opinion that admission to INPATIENT is reasonable and necessary because of the expectation that this patient will require hospital  care that crosses at least 2 midnights to treat this condition based on the medical complexity of the problems presented.  Given the aforementioned information, the predictability of an adverse outcome is felt to be significant.     Janora Norlander MD [facility]  If note is complete, please contact covering daytime or nighttime physician. [email or web address] Password [email or web address]  10/26/2017, 4:40 PM

## 2017-10-26 NOTE — ED Provider Notes (Signed)
[facility] EMERGENCY DEPARTMENT Provider Note   CSN: [id number] Arrival date & time: 10/26/17  1233     History   Chief Complaint Chief Complaint  Patient presents with  . Weakness    HPI [name] is a 77 y.o. male.  HPI  77 year old male with a history of diabetes presents with pain and weakness in his right shoulder and left leg.  He states this is been ongoing for about 4 days.  No trauma.  He had a right knee replacement and some arthroscopic knee surgeries to the left knee.  He denies any injuries.  He denies headache.  He states the arm feels weak but is more that he cannot move his shoulder due to the pain.  No chest pain.  He has been having a cough for a couple weeks.  He denies any known fever.  He is been having some increased urination during this time as well.  Past Medical History:  Diagnosis Date  . Diabetes mellitus    insulin dependent  . Heart disease, unspecified   . Hypercholesterolemia   . Hypertension   . Sarcoidosis   . Unspecified glaucoma(365.9)     Patient Active Problem List   Diagnosis Date Noted  . Angioedema 02/01/2014  . Essential hypertension 02/01/2014  . Type 2 diabetes mellitus with diabetic nephropathy ([place]) 02/01/2014  . Hyperlipemia 02/01/2014  . Pure hypercholesterolemia 12/08/2012  . Type II or unspecified type diabetes mellitus without mention of complication, uncontrolled 12/06/2012  . Unspecified essential hypertension 12/06/2012  . Prostate cancer ([place]) 04/30/2011    Past Surgical History:  Procedure Laterality Date  . CATARACT EXTRACTION    . knee replacement surgery     right        Home Medications    Prior to Admission medications   Medication Sig Start Date End Date Taking? Authorizing Provider  ACCU-CHEK AVIVA PLUS test strip USE AS INSTRUCTED TO CHECK BLOOD SUGAR THREE TIMES DAILY 11/11/16   [name], MD  ACCU-CHEK SOFTCLIX LANCETS lancets by Other route. Use as instructed     [provider]  amLODipine (NORVASC) 10 MG tablet Take 10 mg by mouth daily.    [provider]  aspirin 81 MG tablet Take 81 mg by mouth daily.    [provider]  Blood Glucose Monitoring Suppl (ACCU-CHEK AVIVA PLUS) w/Device KIT Use to check blood sugar 3 times per day dx code E11.65 03/12/16   [name], MD  donepezil (ARICEPT) 5 MG tablet Take 5 mg by mouth at bedtime.  03/22/15   [provider]  dorzolamide-timolol (COSOPT) 22.3-6.8 MG/ML ophthalmic solution 1 drop as directed.  10/13/12   [provider]  ferrous fumarate (HEMOCYTE - 106 MG FE) 325 (106 FE) MG TABS Take 1 tablet by mouth 3 (three) times daily.    [provider]  fish oil-omega-3 fatty acids 1000 MG capsule Take 2 g by mouth daily.      [provider]  hydroxypropyl methylcellulose / hypromellose (ISOPTO TEARS / GONIOVISC) 2.5 % ophthalmic solution Place 1 drop into both eyes as directed.    [provider]  Insulin Detemir (LEVEMIR FLEXTOUCH) 100 UNIT/ML Pen Inject 38 units daily 11/02/15   [name], MD  insulin regular (NOVOLIN R RELION) 250 units/2.57m (100 units/mL) injection INJECT 8 UNITS DAILY BEFORE DINNER 06/02/16   KElayne Snare MD  Insulin Syringe-Needle U-100 (INSULIN SYRINGE .5CC/31GX5/16") 31G X 5/16" 0.5 ML MISC Use one to inject insulin  daily 05/02/15   [name], MD  latanoprost (XALATAN) 0.005 % ophthalmic solution  02/17/14   [provider]  Liraglutide (VICTOZA) 18 MG/3ML SOPN Inject 0.2 mLs (1.2 mg total) into the skin daily. 11/02/15   [name], MD  meclizine (ANTIVERT) 25 MG tablet Take 1 tablet (25 mg total) by mouth 3 (three) times daily as needed for dizziness. 7/79/39   [name], MD  metFORMIN (GLUCOPHAGE) 1000 MG tablet TAKE 1/2 TABLET EACH MORNING AND 1 TABLET AT [facility] 06/13/16   [name], MD  metoprolol tartrate (LOPRESSOR) 25 MG tablet Take 25 mg by mouth 2 (two) times daily.  04/04/14   [provider]  potassium chloride SA (K-DUR,KLOR-CON) 20 MEQ tablet TAKE 1 TABLET EVERY DAY 11/07/15   Renato Shin, MD  simvastatin (ZOCOR) 20 MG tablet Take 20 mg by mouth daily.    [provider]  Tamsulosin HCl (FLOMAX) 0.4 MG CAPS Take by mouth.      [provider]    Family History Family History  Problem Relation Age of Onset  . Hypertension Mother   . Diabetes Mellitus I Mother   . Hypertension Father   . Coronary artery disease Father   . Hypertension Sister   . Hypertension Brother   . Coronary artery disease Brother   . Diabetes Mellitus I Brother   . Diabetes Mellitus I Brother   . Cancer Neg Hx     Social History Social History   Tobacco Use  . Smoking status: Former Smoker    Packs/day: 0.50    Types: Cigarettes    Last attempt to quit: 04/21/2005    Years since quitting: 12.5  . Smokeless tobacco: Never Used  Substance Use Topics  . Alcohol use: No  . Drug use: No     Allergies   Ace inhibitors; Allopurinol; and Aspirin   Review of Systems Review of Systems  Constitutional: Negative for fever.  Respiratory: Positive for cough. Negative for shortness of breath.   Cardiovascular: Negative for chest pain.  Genitourinary: Positive for frequency. Negative for dysuria.  Musculoskeletal: Positive for arthralgias and joint swelling.  Neurological: Positive for weakness. Negative for numbness and headaches.  All other systems reviewed and are negative.    Physical Exam Updated Vital Signs BP 124/71   Pulse 66   Temp (!) 101.2 F (38.4 C) (Rectal)   Resp 19   Ht 6' (1.829 m)   Wt 83.9 kg (185 lb)   SpO2 100%   BMI 25.09 kg/m   Physical Exam  Constitutional: He is oriented to person, place, and time. He appears well-developed and well-nourished. No distress.  HENT:  Head: Normocephalic and atraumatic.  Right Ear: External ear normal.  Left Ear: External ear normal.  Nose: Nose normal.  Eyes: Right eye exhibits no  discharge. Left eye exhibits no discharge.  Neck: Neck supple.  Cardiovascular: Normal rate, regular rhythm and normal heart sounds.  Pulses:      Radial pulses are 2+ on the right side, and 2+ on the left side.       Dorsalis pedis pulses are 2+ on the right side, and 2+ on the left side.  Pulmonary/Chest: Effort normal and breath sounds normal. He has no wheezes. He has no rales.  Abdominal: Soft. There is no tenderness.  Musculoskeletal:       Right shoulder: He exhibits tenderness. He exhibits normal range of motion (painful) and no deformity.       Left knee:  He exhibits swelling. He exhibits normal range of motion (painful). Tenderness (mild) found.  Neurological: He is alert and oriented to person, place, and time.  CN 3-12 grossly intact. 5/5 strength in all 4 extremities. Grossly normal sensation.   Skin: Skin is warm and dry. He is not diaphoretic.  Nursing note and vitals reviewed.    ED Treatments / Results  Labs (all labs ordered are listed, but only abnormal results are displayed) Labs Reviewed  BASIC METABOLIC PANEL - Abnormal; Notable for the following components:      Result Value   Glucose, Bld 272 (*)    GFR calc non Af Amer 56 (*)    All other components within normal limits  CBC WITH DIFFERENTIAL/PLATELET - Abnormal; Notable for the following components:   WBC 3.8 (*)    Hemoglobin 12.0 (*)    HCT 36.5 (*)    MCV 77.7 (*)    MCH 25.5 (*)    RDW 15.9 (*)    All other components within normal limits  CK - Abnormal; Notable for the following components:   Total CK 454 (*)    All other components within normal limits  URINALYSIS, ROUTINE W REFLEX MICROSCOPIC - Abnormal; Notable for the following components:   Glucose, UA >=500 (*)    Protein, ur 30 (*)    All other components within normal limits  SYNOVIAL CELL COUNT + DIFF, W/ CRYSTALS - Abnormal; Notable for the following components:   Color, Synovial STRAW (*)    Appearance-Synovial CLOUDY (*)    WBC,  Synovial 8,400 (*)    Lymphocytes-Synovial Fld 26 (*)    All other components within normal limits  CBG MONITORING, ED - Abnormal; Notable for the following components:   Glucose-Capillary 296 (*)    All other components within normal limits  GRAM STAIN  CULTURE, BLOOD (ROUTINE X 2)  CULTURE, BLOOD (ROUTINE X 2)  CULTURE, BODY FLUID-BOTTLE  B. BURGDORFI ANTIBODIES  I-STAT CG4 LACTIC ACID, ED  I-STAT CG4 LACTIC ACID, ED    EKG EKG Interpretation  Date/Time:  Monday October 26 2017 12:41:18 EDT Ventricular Rate:  80 PR Interval:    QRS Duration: 80 QT Interval:  398 QTC Calculation: 460 R Axis:   13 Text Interpretation:  Sinus rhythm no acute ST/T changes no significant change since 2018 Confirmed by Sherwood Gambler [phone]) on 10/26/2017 1:26:05 PM   Radiology Dg Chest 2 View  Result Date: 10/26/2017 CLINICAL DATA:  77 y/o  M; cough and shortness of breath. EXAM: CHEST - 2 VIEW COMPARISON:  12/26/2010 chest radiograph. FINDINGS: Stable cardiac silhouette given projection and technique. Aortic atherosclerosis with calcification. Pulmonary venous hypertension. Streaky opacity in left lung base, probably atelectasis. No pleural effusion or pneumothorax. No acute osseous abnormality is evident. IMPRESSION: Pulmonary venous hypertension. Left basilar atelectasis. Aortic atherosclerosis. Electronically Signed   By: Kristine Garbe M.D.   On: 10/26/2017 13:44   Dg Shoulder Right  Result Date: 10/26/2017 CLINICAL DATA:  No sure if pt fell,very confused,,frozen rt shoulder unable to move both arms up ,,but he complained of top of rt shoulder,, Lt knee swelling ,,and sob,, EXAM: RIGHT SHOULDER - 2+ VIEW COMPARISON:  None. FINDINGS: Glenohumeral joint is intact. No evidence of scapular fracture or humeral fracture. The acromioclavicular joint is intact. There is narrowing of the joint space superiorly. IMPRESSION: 1.  No fracture or dislocation. 2. Narrowing of the joint space superiorly  could indicate chronic rotator cuff degeneration. Electronically Signed   By: Nicole Kindred  Leonia Reeves M.D.   On: 10/26/2017 13:41   Dg Knee Complete 4 Views Left  Result Date: 10/26/2017 CLINICAL DATA:  Atraumatic knee pain and swelling EXAM: LEFT KNEE - COMPLETE 4+ VIEW COMPARISON:  None FINDINGS: Diffuse osseous demineralization. Tiny marginal spurs at medial compartment. Joint spaces fairly well preserved. Large joint effusion. No acute fracture, dislocation, or bone destruction. Scattered atherosclerotic calcifications. IMPRESSION: Large LEFT knee joint effusion without acute bony abnormalities. Electronically Signed   By: Lavonia Dana M.D.   On: 10/26/2017 13:47    Procedures .Joint Aspiration/Arthrocentesis Date/Time: 10/26/2017 3:10 PM Performed by: Sherwood Gambler, MD Authorized by: Sherwood Gambler, MD   Consent:    Consent obtained:  Verbal and written   Consent given by:  Patient Location:    Location:  Knee   Knee:  L knee Anesthesia (see MAR for exact dosages):    Anesthesia method:  Local infiltration   Local anesthetic:  Lidocaine 2% WITH epi Procedure details:    Preparation: Patient was prepped and draped in usual sterile fashion     Needle gauge:  18 G   Approach:  Lateral   Aspirate amount:  80 mL   Aspirate characteristics:  Yellow and cloudy Post-procedure details:    Dressing:  Adhesive bandage   Patient tolerance of procedure:  Tolerated well, no immediate complications   (including critical care time)  Medications Ordered in ED Medications  sodium chloride 0.9 % bolus 1,000 mL (0 mLs Intravenous Stopped 10/26/17 1544)  acetaminophen (TYLENOL) tablet 650 mg (650 mg Oral Given 10/26/17 1408)  lidocaine-EPINEPHrine (XYLOCAINE W/EPI) 2 %-1:200000 (PF) injection 10 mL (10 mLs Infiltration Given 10/26/17 1414)  cefTRIAXone (ROCEPHIN) 2 g in sodium chloride 0.9 % 100 mL IVPB (0 g Intravenous Stopped 10/26/17 1624)     Initial Impression / Assessment and Plan / ED Course  I  have reviewed the triage vital signs and the nursing notes.  Pertinent labs & imaging results that were available during my care of the patient were reviewed by me and considered in my medical decision making (see chart for details).     Unclear source of the patient's fever.  The patient has multiple possibilities with a non-specific dry cough for 2 weeks as well as urinary frequency.  However no obvious bacterial etiology seen.  I did an arthrocentesis on his left knee given the significant swelling and warmth without erythema.  However this does not appear consistent with septic joint.  With the polyarthritis, I questioned him about tics which they have not pulled a tick off of him recently but he did find a tick in the car that was near other family.  Thus I will send Lyme titers.  Otherwise, I think he will need to be admitted and observed and the hospitalist has been consulted.  Final Clinical Impressions(s) / ED Diagnoses   Final diagnoses:  Fever in adult    ED Discharge Orders    None       Sherwood Gambler, MD 10/26/17 1645

## 2017-10-26 NOTE — Progress Notes (Signed)
Pharmacy Antibiotic Note  [name] is a 77 y.o. male admitted on 10/26/2017 with possible septic knee  Plan: Ceftriaxone 2 g q24h Vanc 750 q12 Monitor renal fx cx vt prn  Height: 6' (182.9 cm) Weight: 185 lb (83.9 kg) IBW/kg (Calculated) : 77.6  Temp (24hrs), Avg:100.4 F (38 C), Min:99.5 F (37.5 C), Max:101.2 F (38.4 C)  Recent Labs  Lab 10/26/17 1329 10/26/17 1401  WBC 3.8*  --   CREATININE 1.24  --   LATICACIDVEN  --  1.83    Estimated Creatinine Clearance: 57.4 mL/min (by C-G formula based on SCr of 1.24 mg/dL).    Allergies  Allergen Reactions  . Ace Inhibitors Other (See Comments)    angioedema  . Allopurinol Rash  . Aspirin     Unknown reaction    Levester Fresh, PharmD, BCPS, BCCCP Clinical Pharmacist [phone]  Please check AMION for all [facility] numbers  10/26/2017 4:48 PM

## 2017-10-26 NOTE — ED Notes (Signed)
Left knee taped per dr Regenia Skeeter, permit signed by pt and nurse, fluid drawn  ABOUT `80 cc , fluid labeled and  walked to lab by tech

## 2017-10-26 NOTE — ED Triage Notes (Addendum)
Pt from home per family and  He has weakness rt side  Arm, x several days pain in all extremities and increased urination, also has had some shuffling gait , hx of TIA, can move fore arm rt but cannotmove shoulder, no cp, sob, dizziness,  Rt side lung has a wheeze, per ems pt feels warm oral temp 99.7  Per ems

## 2017-10-27 DIAGNOSIS — M2550 Pain in unspecified joint: Secondary | ICD-10-CM

## 2017-10-27 LAB — BLOOD CULTURE ID PANEL (REFLEXED)
Acinetobacter baumannii: NOT DETECTED
CANDIDA PARAPSILOSIS: NOT DETECTED
CANDIDA TROPICALIS: NOT DETECTED
Candida albicans: NOT DETECTED
Candida glabrata: NOT DETECTED
Candida krusei: NOT DETECTED
ENTEROCOCCUS SPECIES: NOT DETECTED
ESCHERICHIA COLI: NOT DETECTED
Enterobacter cloacae complex: NOT DETECTED
Enterobacteriaceae species: NOT DETECTED
Haemophilus influenzae: NOT DETECTED
KLEBSIELLA OXYTOCA: NOT DETECTED
KLEBSIELLA PNEUMONIAE: NOT DETECTED
Listeria monocytogenes: NOT DETECTED
Methicillin resistance: DETECTED — AB
Neisseria meningitidis: NOT DETECTED
PROTEUS SPECIES: NOT DETECTED
Pseudomonas aeruginosa: NOT DETECTED
SERRATIA MARCESCENS: NOT DETECTED
STAPHYLOCOCCUS AUREUS BCID: NOT DETECTED
STAPHYLOCOCCUS SPECIES: DETECTED — AB
Streptococcus agalactiae: NOT DETECTED
Streptococcus pneumoniae: NOT DETECTED
Streptococcus pyogenes: NOT DETECTED
Streptococcus species: NOT DETECTED

## 2017-10-27 LAB — GLUCOSE, CAPILLARY
Glucose-Capillary: 238 mg/dL — ABNORMAL HIGH (ref 70–99)
Glucose-Capillary: 188 mg/dL — ABNORMAL HIGH (ref 70–99)
Glucose-Capillary: 176 mg/dL — ABNORMAL HIGH (ref 70–99)
Glucose-Capillary: 153 mg/dL — ABNORMAL HIGH (ref 70–99)

## 2017-10-27 MED ORDER — METFORMIN HCL 500 MG PO TABS
1000.0000 mg | ORAL_TABLET | Freq: Two times a day (BID) | ORAL | Status: DC
  Administered 2017-10-28 – 2017-10-30 (×5): 1000 mg via ORAL
  Filled 2017-10-27 (×5): qty 2

## 2017-10-27 MED ORDER — INSULIN DETEMIR 100 UNIT/ML ~~LOC~~ SOLN
25.0000 [IU] | Freq: Every evening | SUBCUTANEOUS | Status: DC
  Administered 2017-10-27 – 2017-10-29 (×3): 25 [IU] via SUBCUTANEOUS
  Filled 2017-10-27 (×6): qty 0.25

## 2017-10-27 NOTE — Progress Notes (Signed)
PHARMACY - PHYSICIAN COMMUNICATION CRITICAL VALUE ALERT - BLOOD CULTURE IDENTIFICATION (BCID)  [name] is an 77 y.o. male who presented to [facility] on 10/26/2017 with a chief complaint of joint pain.  Assessment:   Continues on abx for probable septic knee. Synovial fluid cell count showed 8.4k WBC. Does have a hx of R knee replacement surgery. Tmax of 101.2. Now with 2/4 blood cx's showing staph species. Will treat as real for now.  Name of physician (or Provider) ContactedMaurene Capes  Current antibiotics: Vancomycin and Ceftriaxone  Changes to prescribed antibiotics recommended:  Continue vancomycin Consider stopping ceftriaxone soon F/U synovial fluid cx as well  Results for orders placed or performed during the hospital encounter of 10/26/17  Blood Culture ID Panel (Reflexed) (Collected: 10/26/2017  1:50 PM)  Result Value Ref Range   Enterococcus species NOT DETECTED NOT DETECTED   Listeria monocytogenes NOT DETECTED NOT DETECTED   Staphylococcus species DETECTED (A) NOT DETECTED   Staphylococcus aureus NOT DETECTED NOT DETECTED   Methicillin resistance DETECTED (A) NOT DETECTED   Streptococcus species NOT DETECTED NOT DETECTED   Streptococcus agalactiae NOT DETECTED NOT DETECTED   Streptococcus pneumoniae NOT DETECTED NOT DETECTED   Streptococcus pyogenes NOT DETECTED NOT DETECTED   Acinetobacter baumannii NOT DETECTED NOT DETECTED   Enterobacteriaceae species NOT DETECTED NOT DETECTED   Enterobacter cloacae complex NOT DETECTED NOT DETECTED   Escherichia coli NOT DETECTED NOT DETECTED   Klebsiella oxytoca NOT DETECTED NOT DETECTED   Klebsiella pneumoniae NOT DETECTED NOT DETECTED   Proteus species NOT DETECTED NOT DETECTED   Serratia marcescens NOT DETECTED NOT DETECTED   Haemophilus influenzae NOT DETECTED NOT DETECTED   Neisseria meningitidis NOT DETECTED NOT DETECTED   Pseudomonas aeruginosa NOT DETECTED NOT DETECTED   Candida albicans NOT DETECTED NOT DETECTED    Candida glabrata NOT DETECTED NOT DETECTED   Candida krusei NOT DETECTED NOT DETECTED   Candida parapsilosis NOT DETECTED NOT DETECTED   Candida tropicalis NOT DETECTED NOT DETECTED    [name] J 10/27/2017  9:49 AM

## 2017-10-27 NOTE — Progress Notes (Signed)
Patient Demographics:    [name], is a 77 y.o. male, DOB - Nov 09, 1940, JJO:[id number]  Admit date - 10/26/2017   Admitting Physician Janora Norlander, MD  Outpatient Primary MD for the patient is [name], MD  LOS - 1   Chief Complaint  Patient presents with  . Weakness        Subjective:    [name] today has no fevers, no emesis,  No chest pain,  Wife at bedside,    Assessment  & Plan :    Principal Problem:   Polyarthralgia Active Problems:   Essential hypertension   Type 2 diabetes mellitus with diabetic nephropathy (HCC)   Hyperlipemia   Fever, unknown origin   Mediastinal lymphadenopathy   Effusion of left knee joint  Brief Summary:- 77 y.o. male with medical history significant of HTN, hyperlipidemia, prostate CA, bladder CA, possible sarcoidosis who presented to the ED with c/o 5 days of diffuse joint pain, BUE and BLE weakness, admitted on 10/26/2017 with fevers, polyarthralgia and polymyalgia, 2 out of 4 bottles blood cultures growing methicillin-resistant coagulase-negative staph   Plan:- 1)Fever and Methicillin-Resistant coagulase-Negative staph  Bacteremia---  ????  Contaminant, continue Rocephin and vancomycin, will get ID consult.  [facility] spotted fever and ehrlichiosis work-up pending, continue doxycycline twice daily twice daily for now  2)Possible inflammatory versus septic Arthritis--- synovial fluid Gram stain and cell count does not support a diagnosis of septic arthritis however will treat as above #1 for possible bacteremia pending further available data and ID consult, ESR is only 23  3)DM2-last A1c was 10, c/n  metformin 1000 mg twice daily, Use Novolog/Humalog Sliding scale insulin with Accu-Cheks/Fingersticks as ordered, c/n Levemir 25 units qpm,    4)HTN--okay to continue amlodipine 10 mg daily and metoprolol 25 mg twice daily  Code Status :  Full code   Disposition Plan  : TBD  Consults  :  ID in am    DVT Prophylaxis  :  Lovenox   Lab Results  Component Value Date   PLT 165 10/26/2017    Inpatient Medications  Scheduled Meds: . amLODipine  10 mg Oral Daily  . aspirin  81 mg Oral QHS  . donepezil  10 mg Oral QHS  . dorzolamide-timolol  2 drop Right Eye BID  . doxycycline  100 mg Oral Q12H  . enoxaparin (LOVENOX) injection  40 mg Subcutaneous Q24H  . escitalopram  5 mg Oral QHS  . insulin aspart  0-15 Units Subcutaneous TID WC  . insulin aspart  0-5 Units Subcutaneous QHS  . latanoprost  1 drop Both Eyes QHS  . mouth rinse  15 mL Mouth Rinse BID  . metFORMIN  1,000 mg Oral QHS  . metFORMIN  500 mg Oral Q breakfast  . metoprolol tartrate  25 mg Oral BID  . omega-3 acid ethyl esters  1 g Oral Daily  . potassium chloride SA  20 mEq Oral Daily  . prednisoLONE acetate  1 drop Right Eye Daily  . simvastatin  20 mg Oral q1800  . tamsulosin  0.4 mg Oral QHS   Continuous Infusions: . sodium chloride 75 mL/hr at 10/27/17 1119  . cefTRIAXone (ROCEPHIN)  IV 2 g (10/27/17 1315)  . vancomycin 750  mg (10/27/17 1738)   PRN Meds:.hydroxypropyl methylcellulose / hypromellose    Anti-infectives (From admission, onward)   Start     Dose/Rate Route Frequency Ordered Stop   10/27/17 1400  cefTRIAXone (ROCEPHIN) 2 g in sodium chloride 0.9 % 100 mL IVPB     2 g 200 mL/hr over 30 Minutes Intravenous Every 24 hours 10/26/17 1647     10/26/17 2300  doxycycline (VIBRA-TABS) tablet 100 mg     100 mg Oral Every 12 hours 10/26/17 2202     10/26/17 1700  vancomycin (VANCOCIN) IVPB 750 mg/150 ml premix     750 mg 150 mL/hr over 60 Minutes Intravenous Every 12 hours 10/26/17 1647     10/26/17 1515  cefTRIAXone (ROCEPHIN) 2 g in sodium chloride 0.9 % 100 mL IVPB     2 g 200 mL/hr over 30 Minutes Intravenous  Once 10/26/17 1505 10/26/17 1624        Objective:   Vitals:   10/27/17 0650 10/27/17 0916 10/27/17 1612 10/27/17  1612  BP: (!) 146/70 125/72 133/74 133/74  Pulse: 82 87 77 74  Resp: 18  (!) 28 (!) 28  Temp: 99 F (37.2 C) 99.3 F (37.4 C) 99.8 F (37.7 C) 99.8 F (37.7 C)  TempSrc: Oral Oral Oral Oral  SpO2: 95% 96% (!) 88% (!) 89%  Weight:      Height:        Wt Readings from Last 3 Encounters:  10/26/17 83.9 kg (185 lb)  07/04/16 82.6 kg (182 lb)  06/20/16 83.5 kg (184 lb)     Intake/Output Summary (Last 24 hours) at 10/27/2017 1751 Last data filed at 10/27/2017 1400 Gross per 24 hour  Intake 1679.55 ml  Output 400 ml  Net 1279.55 ml     Physical Exam Gen:- Awake Alert,  In no apparent distress  HEENT:- [place].AT, No sclera icterus Neck-Supple Neck,No JVD,.  Lungs-  CTAB , good air movement CV- S1, S2 normal Abd-  +ve B.Sounds, Abd Soft, No tenderness,    Extremity/Skin:-Swelling and range of motion has improved post aspiration, bilateral shoulder discomfort and reduced range of motion in both shoulders persist, no significant erythema of the skin overlying the joints Psych-affect is appropriate, oriented x3 Neuro-no new focal deficits, no tremors   Data Review:   Micro Results Recent Results (from the past 240 hour(s))  Culture, blood (routine x 2)     Status: None (Preliminary result)   Collection Time: 10/26/17  1:50 PM  Result Value Ref Range Status   Specimen Description BLOOD RIGHT FOREARM  Final   Special Requests   Final    BOTTLES DRAWN AEROBIC AND ANAEROBIC Blood Culture adequate volume Performed at [facility], [address], [place], [place] [address]    Culture  Setup Time   Final    GRAM POSITIVE COCCI IN BOTH AEROBIC AND ANAEROBIC BOTTLES Organism ID to follow CRITICAL RESULT CALLED TO, READ BACK BY AND VERIFIED WITH: Karlene Einstein PharmD 9:50 10/27/17 ([email or web address])    Culture GRAM POSITIVE COCCI  Final   Report Status PENDING  Incomplete  Blood Culture ID Panel (Reflexed)     Status: Abnormal   Collection Time: 10/26/17  1:50 PM  Result Value Ref Range  Status   Enterococcus species NOT DETECTED NOT DETECTED Final   Listeria monocytogenes NOT DETECTED NOT DETECTED Final   Staphylococcus species DETECTED (A) NOT DETECTED Final    Comment: Methicillin (oxacillin) resistant coagulase negative staphylococcus. Possible blood culture contaminant (  unless isolated from more than one blood culture draw or clinical case suggests pathogenicity). No antibiotic treatment is indicated for blood  culture contaminants. CRITICAL RESULT CALLED TO, READ BACK BY AND VERIFIED WITH: Karlene Einstein PharmD 9:50 10/27/17 ([email or web address])    Staphylococcus aureus NOT DETECTED NOT DETECTED Final   Methicillin resistance DETECTED (A) NOT DETECTED Final    Comment: CRITICAL RESULT CALLED TO, READ BACK BY AND VERIFIED WITH: Karlene Einstein PharmD 9:50 10/27/17 ([email or web address])    Streptococcus species NOT DETECTED NOT DETECTED Final   Streptococcus agalactiae NOT DETECTED NOT DETECTED Final   Streptococcus pneumoniae NOT DETECTED NOT DETECTED Final   Streptococcus pyogenes NOT DETECTED NOT DETECTED Final   Acinetobacter baumannii NOT DETECTED NOT DETECTED Final   Enterobacteriaceae species NOT DETECTED NOT DETECTED Final   Enterobacter cloacae complex NOT DETECTED NOT DETECTED Final   Escherichia coli NOT DETECTED NOT DETECTED Final   Klebsiella oxytoca NOT DETECTED NOT DETECTED Final   Klebsiella pneumoniae NOT DETECTED NOT DETECTED Final   Proteus species NOT DETECTED NOT DETECTED Final   Serratia marcescens NOT DETECTED NOT DETECTED Final   Haemophilus influenzae NOT DETECTED NOT DETECTED Final   Neisseria meningitidis NOT DETECTED NOT DETECTED Final   Pseudomonas aeruginosa NOT DETECTED NOT DETECTED Final   Candida albicans NOT DETECTED NOT DETECTED Final   Candida glabrata NOT DETECTED NOT DETECTED Final   Candida krusei NOT DETECTED NOT DETECTED Final   Candida parapsilosis NOT DETECTED NOT DETECTED Final   Candida tropicalis NOT DETECTED NOT DETECTED Final  Culture, blood  (routine x 2)     Status: None (Preliminary result)   Collection Time: 10/26/17  2:31 PM  Result Value Ref Range Status   Specimen Description BLOOD SITE NOT SPECIFIED  Final   Special Requests   Final    BOTTLES DRAWN AEROBIC AND ANAEROBIC Blood Culture results may not be optimal due to an inadequate volume of blood received in culture bottles   Culture   Final    NO GROWTH < 24 HOURS Performed at [facility], [address] [place], [place] [address]    Report Status PENDING  Incomplete  Gram stain     Status: None   Collection Time: 10/26/17  3:00 PM  Result Value Ref Range Status   Specimen Description SYNOVIAL LEFT KNEE  Final   Special Requests NONE  Final   Gram Stain   Final    ABUNDANT WBC PRESENT, PREDOMINANTLY MONONUCLEAR NO ORGANISMS SEEN Performed at [facility], [address] [place], [place] [address]    Report Status 10/26/2017 FINAL  Final  Culture, body fluid-bottle     Status: None (Preliminary result)   Collection Time: 10/26/17  3:00 PM  Result Value Ref Range Status   Specimen Description SYNOVIAL LEFT KNEE  Final   Special Requests NONE  Final   Culture   Final    NO GROWTH < 24 HOURS Performed at [facility], [place] [address], [place], [place] [address]    Report Status PENDING  Incomplete    Radiology Reports Dg Chest 2 View  Result Date: 10/26/2017 CLINICAL DATA:  77 y/o  M; cough and shortness of breath. EXAM: CHEST - 2 VIEW COMPARISON:  12/26/2010 chest radiograph. FINDINGS: Stable cardiac silhouette given projection and technique. Aortic atherosclerosis with calcification. Pulmonary venous hypertension. Streaky opacity in left lung base, probably atelectasis. No pleural effusion or pneumothorax. No acute osseous abnormality is evident. IMPRESSION: Pulmonary venous hypertension. Left basilar atelectasis.  Aortic atherosclerosis. Electronically Signed   By: Kristine Garbe M.D.   On: 10/26/2017 13:44   Dg Shoulder  Right  Result Date: 10/26/2017 CLINICAL DATA:  No sure if pt fell,very confused,,frozen rt shoulder unable to move both arms up ,,but he complained of top of rt shoulder,, Lt knee swelling ,,and sob,, EXAM: RIGHT SHOULDER - 2+ VIEW COMPARISON:  None. FINDINGS: Glenohumeral joint is intact. No evidence of scapular fracture or humeral fracture. The acromioclavicular joint is intact. There is narrowing of the joint space superiorly. IMPRESSION: 1.  No fracture or dislocation. 2. Narrowing of the joint space superiorly could indicate chronic rotator cuff degeneration. Electronically Signed   By: Suzy Bouchard M.D.   On: 10/26/2017 13:41   Dg Knee Complete 4 Views Left  Result Date: 10/26/2017 CLINICAL DATA:  Atraumatic knee pain and swelling EXAM: LEFT KNEE - COMPLETE 4+ VIEW COMPARISON:  None FINDINGS: Diffuse osseous demineralization. Tiny marginal spurs at medial compartment. Joint spaces fairly well preserved. Large joint effusion. No acute fracture, dislocation, or bone destruction. Scattered atherosclerotic calcifications. IMPRESSION: Large LEFT knee joint effusion without acute bony abnormalities. Electronically Signed   By: Lavonia Dana M.D.   On: 10/26/2017 13:47     CBC Recent Labs  Lab 10/26/17 1329  WBC 3.8*  HGB 12.0*  HCT 36.5*  PLT 165  MCV 77.7*  MCH 25.5*  MCHC 32.9  RDW 15.9*  LYMPHSABS 1.1  MONOABS 0.4  EOSABS 0.0  BASOSABS 0.0    Chemistries  Recent Labs  Lab 10/26/17 1329 10/26/17 2238  NA 140  --   K 4.3  --   CL 106  --   CO2 29  --   GLUCOSE 272*  --   BUN 13  --   CREATININE 1.24  --   CALCIUM 9.2  --   AST  --  37  ALT  --  29  ALKPHOS  --  57  BILITOT  --  0.6   ------------------------------------------------------------------------------------------------------------------ No results for input(s): CHOL, HDL, LDLCALC, TRIG, CHOLHDL, LDLDIRECT in the last 72 hours.  Lab Results  Component Value Date   HGBA1C 10.0 (H) 10/26/2017    ------------------------------------------------------------------------------------------------------------------ No results for input(s): TSH, T4TOTAL, T3FREE, THYROIDAB in the last 72 hours.  Invalid input(s): FREET3 ------------------------------------------------------------------------------------------------------------------ No results for input(s): VITAMINB12, FOLATE, FERRITIN, TIBC, IRON, RETICCTPCT in the last 72 hours.  Coagulation profile No results for input(s): INR, PROTIME in the last 168 hours.  No results for input(s): DDIMER in the last 72 hours.  Cardiac Enzymes No results for input(s): CKMB, TROPONINI, MYOGLOBIN in the last 168 hours.  Invalid input(s): CK ------------------------------------------------------------------------------------------------------------------ No results found for: BNP   [name] M.D on 10/27/2017 at 5:51 PM  Between 7am to 7pm - Pager - [phone]  After 7pm go to [email or web address] - password [email or web address]  [facility] -  Office  [phone]   Voice Recognition Viviann Spare dictation system was used to create this note, attempts have been made to correct errors. Please contact the author with questions and/or clarifications.

## 2017-10-27 NOTE — Progress Notes (Addendum)
Inpatient Diabetes Program Recommendations  AACE/ADA: New Consensus Statement on Inpatient Glycemic Control (2015)  Target Ranges:  Prepandial:   less than 140 mg/dL      Peak postprandial:   less than 180 mg/dL (1-2 hours)      Critically ill patients:  140 - 180 mg/dL   Results for [name], [name] (MRN [id number]) as of 10/27/2017 10:24  Ref. Range 10/26/2017 13:00 10/26/2017 21:50 10/27/2017 07:51  Glucose-Capillary Latest Ref Range: 70 - 99 mg/dL 296 (H) 275 (H) 238 (H)   Review of Glycemic Control  Diabetes history: DM 2 Outpatient Diabetes medications: Levemir 38 units Daily, Novolog R 8 units Daily at supper, Victoza 1.2 mg Daily, Metformin 500 qam, 1000 qpm Current orders for Inpatient glycemic control: Metformin 500 mg qam, 1000 mg qpm, Novolog 0-15 units tid  Inpatient Diabetes Program Recommendations:    A1c 10% on 7/8  Glucose trend is in the 200 range. Consider Levemir 16 units.  Thanks,  Tama Headings RN, MSN, BC-ADM, [facility] Inpatient Diabetes Coordinator Team Pager [phone] (8a-5p)  Addendum 1203 pm: Spoke with patient and wife at bedside. Per wife patient takes regular insulin and metformin at home only. Wife reports patient sees his doctor every 6 months and is due for another check up. Spoke with patient and wife about Current A1c level 10% and discussed A1c and glucose goals. Discussed lifestyle modifications with diet and exercise at length.  Thanks,  Tama Headings RN, MSN, BC-ADM, [facility] Inpatient Diabetes Coordinator Team Pager [phone] (8a-5p)

## 2017-10-28 DIAGNOSIS — Z96651 Presence of right artificial knee joint: Secondary | ICD-10-CM

## 2017-10-28 DIAGNOSIS — E1121 Type 2 diabetes mellitus with diabetic nephropathy: Secondary | ICD-10-CM

## 2017-10-28 DIAGNOSIS — Z8709 Personal history of other diseases of the respiratory system: Secondary | ICD-10-CM

## 2017-10-28 DIAGNOSIS — R509 Fever, unspecified: Secondary | ICD-10-CM

## 2017-10-28 DIAGNOSIS — Z888 Allergy status to other drugs, medicaments and biological substances status: Secondary | ICD-10-CM

## 2017-10-28 DIAGNOSIS — R05 Cough: Secondary | ICD-10-CM

## 2017-10-28 DIAGNOSIS — M25462 Effusion, left knee: Secondary | ICD-10-CM

## 2017-10-28 DIAGNOSIS — Z8546 Personal history of malignant neoplasm of prostate: Secondary | ICD-10-CM

## 2017-10-28 DIAGNOSIS — Z8551 Personal history of malignant neoplasm of bladder: Secondary | ICD-10-CM

## 2017-10-28 DIAGNOSIS — Z87891 Personal history of nicotine dependence: Secondary | ICD-10-CM

## 2017-10-28 DIAGNOSIS — Z833 Family history of diabetes mellitus: Secondary | ICD-10-CM

## 2017-10-28 LAB — EHRLICHIA ANTIBODY PANEL
E chaffeensis (HGE) Ab, IgG: NEGATIVE
E chaffeensis (HGE) Ab, IgM: NEGATIVE
E. Chaffeensis (HME) IgM Titer: NEGATIVE
E.Chaffeensis (HME) IgG: NEGATIVE

## 2017-10-28 LAB — BASIC METABOLIC PANEL
Anion gap: 11 (ref 5–15)
BUN: 8 mg/dL (ref 8–23)
CHLORIDE: 103 mmol/L (ref 98–111)
CO2: 26 mmol/L (ref 22–32)
Calcium: 8.5 mg/dL — ABNORMAL LOW (ref 8.9–10.3)
Creatinine, Ser: 1.07 mg/dL (ref 0.61–1.24)
GFR calc Af Amer: >60 mL/min (ref >60)
GFR calc non Af Amer: >60 mL/min (ref >60)
Glucose, Bld: 124 mg/dL — ABNORMAL HIGH (ref 70–99)
Potassium: 3.3 mmol/L — ABNORMAL LOW (ref 3.5–5.1)
Sodium: 140 mmol/L (ref 135–145)

## 2017-10-28 LAB — GLUCOSE, CAPILLARY
Glucose-Capillary: 119 mg/dL — ABNORMAL HIGH (ref 70–99)
Glucose-Capillary: 145 mg/dL — ABNORMAL HIGH (ref 70–99)
Glucose-Capillary: 141 mg/dL — ABNORMAL HIGH (ref 70–99)
Glucose-Capillary: 159 mg/dL — ABNORMAL HIGH (ref 70–99)

## 2017-10-28 LAB — CBC
HEMATOCRIT: 32.4 % — AB (ref 39.0–52.0)
HEMOGLOBIN: 10.4 g/dL — AB (ref 13.0–17.0)
MCH: 24.4 pg — AB (ref 26.0–34.0)
MCHC: 32.1 g/dL (ref 30.0–36.0)
MCV: 76.1 fL — AB (ref 78.0–100.0)
Platelets: 143 10*3/uL — ABNORMAL LOW (ref 150–400)
RBC: 4.26 MIL/uL (ref 4.22–5.81)
RDW: 15.4 % (ref 11.5–15.5)
WBC: 4.1 10*3/uL (ref 4.0–10.5)

## 2017-10-28 LAB — PARVOVIRUS B19 ANTIBODY, IGG AND IGM
Parovirus B19 IgG Abs: 0.7 index (ref 0.0–0.8)
Parovirus B19 IgM Abs: 0.2 index (ref 0.0–0.8)

## 2017-10-28 LAB — B. BURGDORFI ANTIBODIES: B burgdorferi Ab IgG+IgM: <0.91 {ISR} (ref 0.00–0.90)

## 2017-10-28 MED ORDER — LACTULOSE 10 GM/15ML PO SOLN
30.0000 g | Freq: Once | ORAL | Status: AC
Start: 2017-10-28 — End: 2017-10-28
  Administered 2017-10-28: 30 g via ORAL
  Filled 2017-10-28: qty 45

## 2017-10-28 MED ORDER — ACETAMINOPHEN 325 MG PO TABS
650.0000 mg | ORAL_TABLET | Freq: Four times a day (QID) | ORAL | Status: DC | PRN
  Administered 2017-10-28 – 2017-10-29 (×2): 650 mg via ORAL
  Filled 2017-10-28 (×2): qty 2

## 2017-10-28 NOTE — Consult Note (Addendum)
[facility]    Date of Admission:  10/26/2017   Total days of antibiotics 3        Day 3: Doxycylcine        Day 3: Ceftriaxone        Day 2: Vancomycin       Reason for Consult: Polarthralgia  Referring Provider: Dr. [name] Primary Care Provider: Dr. [name]  Assessment: [name] is a 77 y.o male with a medical history significant of DM, prostate cancer, bladder cancer, and previously diagnosed sarcoidosis presenting with diffuse joint, fevers and blood cultures growing methicillin resistant coagulase negative staph (most likely contaminant). He was started on septic joint antibiotic therapy with vancomycin and ceftriaxone and doxycycline pending RMSF, Lyme and ehrlichiosis work up.   His diffuse joint pain has improved and he is able to ambulate on his own now. His left knee is still swollen and erythematous. We do not suspect bacterial arthritis due to synovial fluid analysis but cannot rule out viral causes of polyarthritis or tick borne illness, although this is less likely. We will continue to monitor cultures and blood work up for tick borne causes.    Plan: 1. Discontinue ceftriaxone and vancomycin 2. Continue doxycycline pending RMSF, lyme and ehrlichiosis workup  3. Continue to monitor cultures    Principal Problem:   Polyarthralgia Active Problems:   Essential hypertension   Type 2 diabetes mellitus with diabetic nephropathy (HCC)   Hyperlipemia   Fever, unknown origin   Mediastinal lymphadenopathy   Effusion of left knee joint   Scheduled Meds: . amLODipine  10 mg Oral Daily  . aspirin  81 mg Oral QHS  . donepezil  10 mg Oral QHS  . dorzolamide-timolol  2 drop Right Eye BID  . doxycycline  100 mg Oral Q12H  . enoxaparin (LOVENOX) injection  40 mg Subcutaneous Q24H  . escitalopram  5 mg Oral QHS  . insulin aspart  0-15 Units Subcutaneous TID WC  . insulin aspart  0-5 Units Subcutaneous QHS  . insulin  detemir  25 Units Subcutaneous QPM  . latanoprost  1 drop Both Eyes QHS  . mouth rinse  15 mL Mouth Rinse BID  . metFORMIN  1,000 mg Oral BID WC  . metoprolol tartrate  25 mg Oral BID  . omega-3 acid ethyl esters  1 g Oral Daily  . potassium chloride SA  20 mEq Oral Daily  . prednisoLONE acetate  1 drop Right Eye Daily  . simvastatin  20 mg Oral q1800  . tamsulosin  0.4 mg Oral QHS   Continuous Infusions: . sodium chloride 20 mL/hr at 10/28/17 0945  . cefTRIAXone (ROCEPHIN)  IV 2 g (10/27/17 1315)  . vancomycin 750 mg (10/28/17 0507)   PRN Meds:.acetaminophen, hydroxypropyl methylcellulose / hypromellose  HPI: [name] is a 77 y.o. male with a medical history significant of DM, prostate cancer, bladder cancer, and previously diagnosed sarcoidosis who presented to the ED c/o 5 days of diffuse joint pain, bilateral upper and lower extremity weakness and fevers. He recently traveled to [place] on 7/4 for a family reunion and noticed that same day he began having weakness in his arms and legs and had trouble walking. His diffuse joint pain progressed and he became unable to walk the last couple of days due to weakness and pain. He noticed fever and chills that also began on 7/4. He denied any sick contacts, eating any  mayonnaise or raw/undercooked meat, traveling outside of the country or tick bites. However, the his family saw tick(s) in his daughter's car recently. He noticed on the day before admission he could not lift his shoulders above his head.   Upon admission, he was febrile at 101.5 and his left knee synovial fluid showed a WBC of 8400 with no crystals seen. He was seen and evaluated at the bedside today. He reported he felt much better and was now able to ambulate on his own again. He denied joint pain today and reported that his leg weakness has resolved. He denied any fevers, nausea, abdominal pain, headaches, or chest pain. He also said he has never experienced joint pain like this  before. He had a right knee replacement and a left knee arthroscopy many years ago but no recent trauma or problems with either knee.    Review of Systems: Review of Systems  Constitutional: Negative for chills and fever.  Respiratory: Positive for cough (chronic cough due to smoking). Negative for sputum production, shortness of breath and wheezing.   Cardiovascular: Negative for chest pain.  Gastrointestinal: Negative for abdominal pain, diarrhea, nausea and vomiting.  Genitourinary: Negative for dysuria.  Musculoskeletal: Negative for joint pain and myalgias.  Skin: Negative for rash.  Neurological: Negative for weakness and headaches.    Past Medical History:  Diagnosis Date  . Diabetes mellitus    insulin dependent  . Heart disease, unspecified   . Hypercholesterolemia   . Hypertension   . Sarcoidosis   . Unspecified glaucoma(365.9)     Social History   Tobacco Use  . Smoking status: Former Smoker    Packs/day: 0.50    Types: Cigarettes    Last attempt to quit: 04/21/2005    Years since quitting: 12.5  . Smokeless tobacco: Never Used  Substance Use Topics  . Alcohol use: No  . Drug use: No    Family History  Problem Relation Age of Onset  . Hypertension Mother   . Diabetes Mellitus I Mother   . Hypertension Father   . Coronary artery disease Father   . Hypertension Sister   . Hypertension Brother   . Coronary artery disease Brother   . Diabetes Mellitus I Brother   . Diabetes Mellitus I Brother   . Cancer Neg Hx    Allergies  Allergen Reactions  . Ace Inhibitors Other (See Comments)    angioedema  . Allopurinol Rash    OBJECTIVE: Blood pressure 136/66, pulse 75, temperature 98.6 F (37 C), temperature source Oral, resp. rate 18, height 6' (1.829 m), weight 87.5 kg (192 lb 14.4 oz), SpO2 91 %.  Physical Exam  Constitutional: He is oriented to person, place, and time.  Cardiovascular: Normal rate, regular rhythm, normal heart sounds and intact distal  pulses.  Pulmonary/Chest: Effort normal and breath sounds normal.  Abdominal: Soft. Bowel sounds are normal.  Musculoskeletal:  Left knee was swollen and erythematous  Neurological: He is alert and oriented to person, place, and time.  Skin: Skin is warm.    Lab Results Lab Results  Component Value Date   WBC 4.1 10/28/2017   HGB 10.4 (L) 10/28/2017   HCT 32.4 (L) 10/28/2017   MCV 76.1 (L) 10/28/2017   PLT 143 (L) 10/28/2017    Lab Results  Component Value Date   CREATININE 1.07 10/28/2017   BUN 8 10/28/2017   NA 140 10/28/2017   K 3.3 (L) 10/28/2017   CL 103 10/28/2017   CO2 26  10/28/2017    Lab Results  Component Value Date   ALT 29 10/26/2017   AST 37 10/26/2017   ALKPHOS 57 10/26/2017   BILITOT 0.6 10/26/2017     Microbiology: Recent Results (from the past 240 hour(s))  Culture, blood (routine x 2)     Status: None (Preliminary result)   Collection Time: 10/26/17  1:50 PM  Result Value Ref Range Status   Specimen Description BLOOD RIGHT FOREARM  Final   Special Requests   Final    BOTTLES DRAWN AEROBIC AND ANAEROBIC Blood Culture adequate volume   Culture  Setup Time   Final    GRAM POSITIVE COCCI IN BOTH AEROBIC AND ANAEROBIC BOTTLES CRITICAL RESULT CALLED TO, READ BACK BY AND VERIFIED WITH: Karlene Einstein PharmD 9:50 10/27/17 ([email or web address]) Performed at [facility], [place] [address], [place], [place] [address]    Culture GRAM POSITIVE COCCI  Final   Report Status PENDING  Incomplete  Blood Culture ID Panel (Reflexed)     Status: Abnormal   Collection Time: 10/26/17  1:50 PM  Result Value Ref Range Status   Enterococcus species NOT DETECTED NOT DETECTED Final   Listeria monocytogenes NOT DETECTED NOT DETECTED Final   Staphylococcus species DETECTED (A) NOT DETECTED Final    Comment: Methicillin (oxacillin) resistant coagulase negative staphylococcus. Possible blood culture contaminant (unless isolated from more than one blood culture draw or clinical case  suggests pathogenicity). No antibiotic treatment is indicated for blood  culture contaminants. CRITICAL RESULT CALLED TO, READ BACK BY AND VERIFIED WITH: Karlene Einstein PharmD 9:50 10/27/17 ([email or web address])    Staphylococcus aureus NOT DETECTED NOT DETECTED Final   Methicillin resistance DETECTED (A) NOT DETECTED Final    Comment: CRITICAL RESULT CALLED TO, READ BACK BY AND VERIFIED WITH: Karlene Einstein PharmD 9:50 10/27/17 ([email or web address])    Streptococcus species NOT DETECTED NOT DETECTED Final   Streptococcus agalactiae NOT DETECTED NOT DETECTED Final   Streptococcus pneumoniae NOT DETECTED NOT DETECTED Final   Streptococcus pyogenes NOT DETECTED NOT DETECTED Final   Acinetobacter baumannii NOT DETECTED NOT DETECTED Final   Enterobacteriaceae species NOT DETECTED NOT DETECTED Final   Enterobacter cloacae complex NOT DETECTED NOT DETECTED Final   Escherichia coli NOT DETECTED NOT DETECTED Final   Klebsiella oxytoca NOT DETECTED NOT DETECTED Final   Klebsiella pneumoniae NOT DETECTED NOT DETECTED Final   Proteus species NOT DETECTED NOT DETECTED Final   Serratia marcescens NOT DETECTED NOT DETECTED Final   Haemophilus influenzae NOT DETECTED NOT DETECTED Final   Neisseria meningitidis NOT DETECTED NOT DETECTED Final   Pseudomonas aeruginosa NOT DETECTED NOT DETECTED Final   Candida albicans NOT DETECTED NOT DETECTED Final   Candida glabrata NOT DETECTED NOT DETECTED Final   Candida krusei NOT DETECTED NOT DETECTED Final   Candida parapsilosis NOT DETECTED NOT DETECTED Final   Candida tropicalis NOT DETECTED NOT DETECTED Final  Culture, blood (routine x 2)     Status: None (Preliminary result)   Collection Time: 10/26/17  2:31 PM  Result Value Ref Range Status   Specimen Description BLOOD SITE NOT SPECIFIED  Final   Special Requests   Final    BOTTLES DRAWN AEROBIC AND ANAEROBIC Blood Culture results may not be optimal due to an inadequate volume of blood received in culture bottles   Culture   Final     NO GROWTH < 24 HOURS Performed at [facility], [address] [place], [place] [address]    Report Status PENDING  Incomplete  Gram stain     Status: None   Collection Time: 10/26/17  3:00 PM  Result Value Ref Range Status   Specimen Description SYNOVIAL LEFT KNEE  Final   Special Requests NONE  Final   Gram Stain   Final    ABUNDANT WBC PRESENT, PREDOMINANTLY MONONUCLEAR NO ORGANISMS SEEN Performed at [facility], [address] [place], [place] [address]    Report Status 10/26/2017 FINAL  Final  Culture, body fluid-bottle     Status: None (Preliminary result)   Collection Time: 10/26/17  3:00 PM  Result Value Ref Range Status   Specimen Description SYNOVIAL LEFT KNEE  Final   Special Requests NONE  Final   Culture   Final    NO GROWTH < 24 HOURS Performed at [facility], [place] [address] [place], [place] [address]    Report Status PENDING  Incomplete     Dutch Quint, [facility]  10/28/2017, 10:34 AM

## 2017-10-28 NOTE — Evaluation (Signed)
Occupational Therapy Evaluation Patient Details Name: [name] MRN: [id number] DOB: 02/07/1941 Today's Date: 10/28/2017    History of Present Illness [name] is a 77 y.o. M presenting with diffuse weakness and joint pain. PMH includes HTN, HLD, DM Type II, Bladder CA, Prostate CA, R TKR, and per notes, a potential diagnosis of Sarcoidosis.   Clinical Impression   Pt is typically independent. Presents with B shoulder pain and limited AROM, impaired standing balance and generalized weakness. Pt requiring min guard assist for mobility with RW. Increased time and effort required to stand. Will follow acutely. Recommending HHOT.    Follow Up Recommendations  Home health OT    Equipment Recommendations  3 in 1 bedside commode    Recommendations for Other Services       Precautions / Restrictions Precautions Precautions: Fall Restrictions Weight Bearing Restrictions: No      Mobility Bed Mobility Overal bed mobility: Modified Independent             General bed mobility comments: pt received in chair  Transfers Overall transfer level: Needs assistance Equipment used: None Transfers: Sit to/from Stand Sit to Stand: Min guard         General transfer comment: from recliner, increased time and effort    Balance Overall balance assessment: Needs assistance Sitting-balance support: Feet unsupported;No upper extremity supported Sitting balance-Leahy Scale: Good Sitting balance - Comments: no LOB with donning and doffing socks Postural control: Posterior lean(with dynamic sitting task) Standing balance support: Bilateral upper extremity supported;No upper extremity supported Standing balance-Leahy Scale: Fair Standing balance comment: can stand statically for pericare and at sink                           ADL either performed or assessed with clinical judgement   ADL Overall ADL's : Needs assistance/impaired Eating/Feeding: Independent;Sitting    Grooming: Standing;Min guard   Upper Body Bathing: Set up;Sitting   Lower Body Bathing: Min guard;Sit to/from stand   Upper Body Dressing : Set up;Sitting   Lower Body Dressing: Min guard;Sit to/from stand   Toilet Transfer: Min guard;Ambulation;RW;BSC Toilet Transfer Details (indicate cue type and reason): educated in use of 3 in 1 over toilet Toileting- Water quality scientist and Hygiene: Min guard;Sit to/from stand       Functional mobility during ADLs: Min guard;Rolling walker       Vision Baseline Vision/History: No visual deficits Patient Visual Report: No change from baseline       Perception     Praxis      Pertinent Vitals/Pain Pain Assessment: Faces Pain Score: 3  Faces Pain Scale: Hurts a little bit Pain Location: bilateral Shoulder Pain Descriptors / Indicators: Sore;Grimacing Pain Intervention(s): Monitored during session     Hand Dominance Right   Extremity/Trunk Assessment Upper Extremity Assessment Upper Extremity Assessment: RUE deficits/detail;LUE deficits/detail RUE Deficits / Details: FF to 100 degrees, 5/5 strength elbow to hand RUE Coordination: decreased gross motor LUE Deficits / Details: FF to 90 degreees, 5/5 elbow to hand LUE Coordination: decreased gross motor   Lower Extremity Assessment Lower Extremity Assessment: Defer to PT evaluation RLE Deficits / Details: Able to perform SLR X 5 with good form. Able to perform ankle pumps as well.  LLE Deficits / Details: Noted L knee swelling. Limited ROM in L ankle. Able to perform SLR X5   Cervical / Trunk Assessment Cervical / Trunk Assessment: Normal   Communication Communication Communication: HOH   Cognition Arousal/Alertness:  Awake/alert Behavior During Therapy: WFL for tasks assessed/performed Overall Cognitive Status: Within Functional Limits for tasks assessed                                 General Comments: pt minimally conversive, wife answers a lot for  him   General Comments  Pt had a loose bowel movement during session on the toilet, notified RN. Wife was educated on follow up therapy recommendations. Movement quality improved w/ length of session.     Exercises Exercises: General Upper Extremity;General Lower Extremity General Exercises - Upper Extremity Shoulder Flexion: AROM;Both;Seated;Other reps (comment)(3; pt limited in ROM and strength) General Exercises - Lower Extremity Ankle Circles/Pumps: AROM;Both;5 reps;Limitations Ankle Circles/Pumps Limitations: Limited ROM in L ankle  Straight Leg Raises: AROM;Both;5 reps   Shoulder Instructions      Home Living Family/patient expects to be discharged to:: Private residence Living Arrangements: Spouse/significant other;Children Available Help at Discharge: Family;Friend(s);Available 24 hours/day Type of Home: House Home Access: Stairs to enter [employer] of Steps: 1 Entrance Stairs-Rails: None Home Layout: One level     Bathroom Shower/Tub: Teacher, early years/pre: Handicapped height Bathroom Accessibility: Yes   Home Equipment: Shower seat          Prior Functioning/Environment Level of Independence: Independent        Comments: drives        OT Problem List: Decreased range of motion;Decreased strength;Impaired balance (sitting and/or standing);Decreased knowledge of use of DME or AE;Decreased coordination;Pain;Impaired UE functional use      OT Treatment/Interventions: Self-care/ADL training;Therapeutic exercise;DME and/or AE instruction;Therapeutic activities;Patient/family education;Balance training    OT Goals(Current goals can be found in the care plan section) Acute Rehab OT Goals Patient Stated Goal: To go home OT Goal Formulation: With patient Time For Goal Achievement: 11/11/17 Potential to Achieve Goals: Good ADL Goals Pt Will Transfer to Toilet: with modified independence;ambulating;bedside commode(over toilet) Pt Will  Perform Tub/Shower Transfer: with supervision;ambulating;shower seat;rolling walker Pt/caregiver will Perform Home Exercise Program: Increased ROM;Increased strength;Both right and left upper extremity;Independently(B shoulder AROM)  OT Frequency: Min 2X/week   Barriers to D/C:            Co-evaluation              AM-PAC PT "6 Clicks" Daily Activity     Outcome Measure Help from another person eating meals?: None Help from another person taking care of personal grooming?: A Little Help from another person toileting, which includes using toliet, bedpan, or urinal?: A Little Help from another person bathing (including washing, rinsing, drying)?: A Little Help from another person to put on and taking off regular upper body clothing?: None Help from another person to put on and taking off regular lower body clothing?: A Little 6 Click Score: 20   End of Session    Activity Tolerance: Patient tolerated treatment well Patient left: in chair;with call bell/phone within reach;with family/visitor present;with chair alarm set  OT Visit Diagnosis: Other abnormalities of gait and mobility (R26.89);Muscle weakness (generalized) (M62.81)                Time: 2778-2423 OT Time Calculation (min): 14 min Charges:  OT General Charges $OT Visit: 1 Visit OT Evaluation $OT Eval Moderate Complexity: 1 Mod G-Codes:     Malka So 10/28/2017, 3:28 PM  10/28/2017 Nestor Lewandowsky, OTR/L Pager: [phone]

## 2017-10-28 NOTE — Evaluation (Signed)
Physical Therapy Evaluation Patient Details Name: [name] MRN: [id number] DOB: 06-May-1940 Today's Date: 10/28/2017   History of Present Illness  [name] is a 77 y.o. M presenting with diffuse weakness and joint pain. PMH includes HTN, HLD, DM Type II, Bladder CA, Prostate CA, R TKR, and per notes, a potential diagnosis of Sarcoidosis.  Clinical Impression  Pt is admitted secondary to problem above with deficits below. Pt requiring min to mod A to stand from lower surface, however, overall fairly steady during gait with RW. Pt did present with limited ROM in bilat shoulders secondary to increased soreness. Pt and wife educated about supine HEP and follow up recommendations. Will continue to follow acutely to maximize functional mobility independence and safety.     Follow Up Recommendations Home health PT;Supervision for mobility/OOB    Equipment Recommendations  Rolling walker with 5" wheels;3in1 (PT)    Recommendations for Other Services OT consult     Precautions / Restrictions Precautions Precautions: Fall Restrictions Weight Bearing Restrictions: No      Mobility  Bed Mobility Overal bed mobility: Modified Independent             General bed mobility comments: Able to sit up at EOB w/o help, but required extra time to perform bed mobility tasks.   Transfers Overall transfer level: Needs assistance Equipment used: Rolling walker (2 wheeled) Transfers: Sit to/from Stand Sit to Stand: Min assist;Mod assist         General transfer comment: Pt with min to mod A for lift assist and steadying assist, especially from lower surface. Required cues for hand placement.   Ambulation/Gait Ambulation/Gait assistance: Min guard Gait Distance (Feet): 80 Feet Assistive device: Rolling walker (2 wheeled) Gait Pattern/deviations: Step-through pattern;Decreased step length - right;Decreased step length - left;Decreased stride length Gait velocity: decreased   General  Gait Details: Pt stood up tall and responded well to cues for posture, but had a tendency to allow walker too far out in front. Required cues for proximity to device. Min guard for safety.   Stairs            Wheelchair Mobility    Modified Rankin (Stroke Patients Only)       Balance Overall balance assessment: Needs assistance Sitting-balance support: Feet unsupported;No upper extremity supported Sitting balance-Leahy Scale: Fair Sitting balance - Comments: When PT asked pt to raise arms, pt fell backwards slowly and was unable to catch himself w/o PT assist. Able to perform static sitting without UE support.  Postural control: Posterior lean(with dynamic sitting task) Standing balance support: Bilateral upper extremity supported;No upper extremity supported Standing balance-Leahy Scale: Fair Standing balance comment: Pt was able to stand and wipe himself after toiletting with MinG from PT                             Pertinent Vitals/Pain Pain Assessment: 0-10 Pain Score: 3  Pain Location: bilateral Shoulder Pain Descriptors / Indicators: Sore(Soreness; starting last Friday (10/21/2017)) Pain Intervention(s): Limited activity within patient's tolerance;Monitored during session;Repositioned    Home Living Family/patient expects to be discharged to:: Private residence Living Arrangements: Spouse/significant other;Children Available Help at Discharge: Family;Friend(s);Available 24 hours/day Type of Home: House Home Access: Stairs to enter Entrance Stairs-Rails: None Entrance Stairs-Number of Steps: 1 Home Layout: One level Home Equipment: None      Prior Function Level of Independence: Independent  Hand Dominance   Dominant Hand: Right    Extremity/Trunk Assessment   Upper Extremity Assessment Upper Extremity Assessment: Defer to OT evaluation;RUE deficits/detail;LUE deficits/detail RUE Deficits / Details: Could not flex shoulder  above ~100 LUE Deficits / Details: Could not flex shoulder above ~90 degrees     Lower Extremity Assessment Lower Extremity Assessment: Generalized weakness;LLE deficits/detail;RLE deficits/detail(Some limitation during sit<>stand) RLE Deficits / Details: Able to perform SLR X 5 with good form. Able to perform ankle pumps as well.  LLE Deficits / Details: Noted L knee swelling. Limited ROM in L ankle. Able to perform SLR X5    Cervical / Trunk Assessment Cervical / Trunk Assessment: Normal  Communication   Communication: HOH  Cognition Arousal/Alertness: Awake/alert Behavior During Therapy: WFL for tasks assessed/performed Overall Cognitive Status: Within Functional Limits for tasks assessed                                        General Comments General comments (skin integrity, edema, etc.): Pt had a loose bowel movement during session on the toilet, notified RN. Wife was educated on follow up therapy recommendations. Movement quality improved w/ length of session.     Exercises General Exercises - Upper Extremity Shoulder Flexion: AROM;Both;Seated;Other reps (comment)(3; pt limited in ROM and strength) General Exercises - Lower Extremity Ankle Circles/Pumps: AROM;Both;5 reps;Limitations Ankle Circles/Pumps Limitations: Limited ROM in L ankle  Straight Leg Raises: AROM;Both;5 reps   Assessment/Plan    PT Assessment Patient needs continued PT services  PT Problem List Decreased strength;Decreased range of motion;Decreased balance;Decreased mobility;Decreased knowledge of use of DME;Pain       PT Treatment Interventions DME instruction;Gait training;Functional mobility training;Stair training;Therapeutic activities;Therapeutic exercise;Balance training;Patient/family education    PT Goals (Current goals can be found in the Care Plan section)  Acute Rehab PT Goals Patient Stated Goal: To go home PT Goal Formulation: With patient/family Time For Goal  Achievement: 11/11/17 Potential to Achieve Goals: Fair    Frequency Min 3X/week   Barriers to discharge        Co-evaluation               AM-PAC PT "6 Clicks" Daily Activity  Outcome Measure Difficulty turning over in bed (including adjusting bedclothes, sheets and blankets)?: A Little Difficulty moving from lying on back to sitting on the side of the bed? : A Lot Difficulty sitting down on and standing up from a chair with arms (e.g., wheelchair, bedside commode, etc,.)?: Unable Help needed moving to and from a bed to chair (including a wheelchair)?: A Little Help needed walking in hospital room?: A Little Help needed climbing 3-5 steps with a railing? : A Lot 6 Click Score: 14    End of Session Equipment Utilized During Treatment: Gait belt Activity Tolerance: Patient tolerated treatment well Patient left: in chair;with call bell/phone within reach;with family/visitor present;with chair alarm set;Other (comment)(With physician in room) Nurse Communication: Mobility status;Other (comment)(BM ) PT Visit Diagnosis: Unsteadiness on feet (R26.81);Other abnormalities of gait and mobility (R26.89);Muscle weakness (generalized) (M62.81);Difficulty in walking, not elsewhere classified (R26.2);Pain Pain - Right/Left: (bilateral) Pain - part of body: Shoulder    Time: 1207-1250 PT Time Calculation (min) (ACUTE ONLY): 43 min   Charges:   PT Evaluation $PT Eval Low Complexity: 1 Low PT Treatments $Gait Training: 8-22 mins $Therapeutic Activity: 8-22 mins   PT G Codes:  Leighton Ruff, PT, DPT  Acute Rehabilitation Services  Pager: [phone]   Rudean Hitt 10/28/2017, 1:43 PM

## 2017-10-28 NOTE — Progress Notes (Signed)
Patient Demographics:    [name], is a 77 y.o. male, DOB - [birth year], VQQ:[id number]  Admit date - 10/26/2017   Admitting Physician Janora Norlander, MD  Outpatient Primary MD for the patient is [name], MD  LOS - 2   Chief Complaint  Patient presents with  . Weakness        Subjective:    [name] today has no fevers, no emesis,  No chest pain,  Wife at bedside, arthralgia and myalgia appears to be improving slowly  Assessment  & Plan :    Principal Problem:   Polyarthralgia Active Problems:   Essential hypertension   Type 2 diabetes mellitus with diabetic nephropathy (HCC)   Hyperlipemia   Fever in adult   Mediastinal lymphadenopathy   Effusion of left knee joint  Brief Summary:- 77 y.o. male with medical history significant of HTN, hyperlipidemia, prostate CA, bladder CA, possible sarcoidosis who presented to the ED with c/o 5 days of diffuse joint pain, BUE and BLE weakness, admitted on 10/26/2017 with fevers, polyarthralgia and polymyalgia, 2 out of 4 bottles blood cultures growing methicillin-resistant coagulase-negative staph   Plan:- 1)Fever and Methicillin-Resistant coagulase-Negative staph  Bacteremia---  ????  Contaminant, infectious disease consult appreciated, most likely contaminant, stop vancomycin and Rocephin, repeat blood cultures 10/28/2017 , continue doxycycline pending results of [facility] spotted fever  work-up, Ehrlichia antibody panel  and Brucella B burgdorferi  are negative, Parvovirus B19 negative  2)Possible inflammatory versus septic Arthritis--- synovial fluid Gram stain and cell count does not support a diagnosis of septic arthritis ,  ESR is only 23, ID consult appreciated, septic arthritis deemed to be much less likely  3)DM2-last A1c was 10, c/n  metformin 1000 mg twice daily, Use Novolog/Humalog Sliding scale insulin with  Accu-Cheks/Fingersticks as ordered, c/n Levemir 25 units qpm,    4)HTN--okay to continue amlodipine 10 mg daily and metoprolol 25 mg twice daily  5) Generalized weakness/debility/polyarthralgia/polymyalgia--- PT eval appreciated,  Code Status : Full code   Disposition Plan  : Home with home health services  Consults  :  ID    DVT Prophylaxis  :  Lovenox   Lab Results  Component Value Date   PLT 143 (L) 10/28/2017    Inpatient Medications  Scheduled Meds: . amLODipine  10 mg Oral Daily  . aspirin  81 mg Oral QHS  . donepezil  10 mg Oral QHS  . dorzolamide-timolol  2 drop Right Eye BID  . doxycycline  100 mg Oral Q12H  . enoxaparin (LOVENOX) injection  40 mg Subcutaneous Q24H  . escitalopram  5 mg Oral QHS  . insulin aspart  0-15 Units Subcutaneous TID WC  . insulin aspart  0-5 Units Subcutaneous QHS  . insulin detemir  25 Units Subcutaneous QPM  . latanoprost  1 drop Both Eyes QHS  . mouth rinse  15 mL Mouth Rinse BID  . metFORMIN  1,000 mg Oral BID WC  . metoprolol tartrate  25 mg Oral BID  . omega-3 acid ethyl esters  1 g Oral Daily  . potassium chloride SA  20 mEq Oral Daily  . prednisoLONE acetate  1 drop Right Eye Daily  . simvastatin  20 mg Oral q1800  . tamsulosin  0.4 mg Oral QHS   Continuous Infusions: . sodium chloride 20 mL/hr at 10/28/17 0945   PRN Meds:.acetaminophen, hydroxypropyl methylcellulose / hypromellose    Anti-infectives (From admission, onward)   Start     Dose/Rate Route Frequency Ordered Stop   10/27/17 1400  cefTRIAXone (ROCEPHIN) 2 g in sodium chloride 0.9 % 100 mL IVPB  Status:  Discontinued     2 g 200 mL/hr over 30 Minutes Intravenous Every 24 hours 10/26/17 1647 10/28/17 1521   10/26/17 2300  doxycycline (VIBRA-TABS) tablet 100 mg     100 mg Oral Every 12 hours 10/26/17 2202     10/26/17 1700  vancomycin (VANCOCIN) IVPB 750 mg/150 ml premix  Status:  Discontinued     750 mg 150 mL/hr over 60 Minutes Intravenous Every 12 hours  10/26/17 1647 10/28/17 1521   10/26/17 1515  cefTRIAXone (ROCEPHIN) 2 g in sodium chloride 0.9 % 100 mL IVPB     2 g 200 mL/hr over 30 Minutes Intravenous  Once 10/26/17 1505 10/26/17 1624        Objective:   Vitals:   10/27/17 2127 10/28/17 0507 10/28/17 0755 10/28/17 1708  BP: 117/61 135/63 136/66 129/72  Pulse: 76 71 75 71  Resp: 20 20 18 18   Temp: 99.8 F (37.7 C) 98.5 F (36.9 C) 98.6 F (37 C) 97.6 F (36.4 C)  TempSrc: Oral Oral Oral Oral  SpO2: 90% 91% 91% 95%  Weight:      Height:        Wt Readings from Last 3 Encounters:  10/27/17 87.5 kg (192 lb 14.4 oz)  07/04/16 82.6 kg (182 lb)  06/20/16 83.5 kg (184 lb)     Intake/Output Summary (Last 24 hours) at 10/28/2017 1746 Last data filed at 10/28/2017 1739 Gross per 24 hour  Intake 2334.22 ml  Output 1400 ml  Net 934.22 ml     Physical Exam Gen:- Awake Alert,  In no apparent distress  HEENT:- [place].AT, No sclera icterus Neck-Supple Neck,No JVD,.  Lungs-  CTAB , good air movement CV- S1, S2 normal Abd-  +ve B.Sounds, Abd Soft, No tenderness,    Extremity/Skin:-Improving lt knee swelling and range of motion has improved post aspiration, improving bilateral shoulder discomfort and improving range of motion in both shoulders , no significant erythema of the skin overlying the joints Psych-affect is appropriate, oriented x3 Neuro-no new focal deficits, no tremors   Data Review:   Micro Results Recent Results (from the past 240 hour(s))  Culture, blood (routine x 2)     Status: Abnormal (Preliminary result)   Collection Time: 10/26/17  1:50 PM  Result Value Ref Range Status   Specimen Description BLOOD RIGHT FOREARM  Final   Special Requests   Final    BOTTLES DRAWN AEROBIC AND ANAEROBIC Blood Culture adequate volume   Culture  Setup Time   Final    GRAM POSITIVE COCCI IN BOTH AEROBIC AND ANAEROBIC BOTTLES CRITICAL RESULT CALLED TO, READ BACK BY AND VERIFIED WITH: Karlene Einstein PharmD 9:50 10/27/17  ([email or web address]) Performed at [facility], [address], [place], [place] [address]    Culture STAPHYLOCOCCUS SPECIES (COAGULASE NEGATIVE) (A)  Final   Report Status PENDING  Incomplete  Blood Culture ID Panel (Reflexed)     Status: Abnormal   Collection Time: 10/26/17  1:50 PM  Result Value Ref Range Status   Enterococcus species NOT DETECTED NOT DETECTED Final   Listeria monocytogenes NOT DETECTED NOT DETECTED Final   Staphylococcus  species DETECTED (A) NOT DETECTED Final    Comment: Methicillin (oxacillin) resistant coagulase negative staphylococcus. Possible blood culture contaminant (unless isolated from more than one blood culture draw or clinical case suggests pathogenicity). No antibiotic treatment is indicated for blood  culture contaminants. CRITICAL RESULT CALLED TO, READ BACK BY AND VERIFIED WITH: Karlene Einstein PharmD 9:50 10/27/17 ([email or web address])    Staphylococcus aureus NOT DETECTED NOT DETECTED Final   Methicillin resistance DETECTED (A) NOT DETECTED Final    Comment: CRITICAL RESULT CALLED TO, READ BACK BY AND VERIFIED WITH: Karlene Einstein PharmD 9:50 10/27/17 ([email or web address])    Streptococcus species NOT DETECTED NOT DETECTED Final   Streptococcus agalactiae NOT DETECTED NOT DETECTED Final   Streptococcus pneumoniae NOT DETECTED NOT DETECTED Final   Streptococcus pyogenes NOT DETECTED NOT DETECTED Final   Acinetobacter baumannii NOT DETECTED NOT DETECTED Final   Enterobacteriaceae species NOT DETECTED NOT DETECTED Final   Enterobacter cloacae complex NOT DETECTED NOT DETECTED Final   Escherichia coli NOT DETECTED NOT DETECTED Final   Klebsiella oxytoca NOT DETECTED NOT DETECTED Final   Klebsiella pneumoniae NOT DETECTED NOT DETECTED Final   Proteus species NOT DETECTED NOT DETECTED Final   Serratia marcescens NOT DETECTED NOT DETECTED Final   Haemophilus influenzae NOT DETECTED NOT DETECTED Final   Neisseria meningitidis NOT DETECTED NOT DETECTED Final   Pseudomonas aeruginosa NOT  DETECTED NOT DETECTED Final   Candida albicans NOT DETECTED NOT DETECTED Final   Candida glabrata NOT DETECTED NOT DETECTED Final   Candida krusei NOT DETECTED NOT DETECTED Final   Candida parapsilosis NOT DETECTED NOT DETECTED Final   Candida tropicalis NOT DETECTED NOT DETECTED Final  Culture, blood (routine x 2)     Status: None (Preliminary result)   Collection Time: 10/26/17  2:31 PM  Result Value Ref Range Status   Specimen Description BLOOD SITE NOT SPECIFIED  Final   Special Requests   Final    BOTTLES DRAWN AEROBIC AND ANAEROBIC Blood Culture results may not be optimal due to an inadequate volume of blood received in culture bottles   Culture   Final    NO GROWTH 2 DAYS Performed at [facility], [address] [place], [place] [address]    Report Status PENDING  Incomplete  Gram stain     Status: None   Collection Time: 10/26/17  3:00 PM  Result Value Ref Range Status   Specimen Description SYNOVIAL LEFT KNEE  Final   Special Requests NONE  Final   Gram Stain   Final    ABUNDANT WBC PRESENT, PREDOMINANTLY MONONUCLEAR NO ORGANISMS SEEN Performed at [facility], [address] [place],  [address]    Report Status 10/26/2017 FINAL  Final  Culture, body fluid-bottle     Status: None (Preliminary result)   Collection Time: 10/26/17  3:00 PM  Result Value Ref Range Status   Specimen Description SYNOVIAL LEFT KNEE  Final   Special Requests NONE  Final   Culture   Final    NO GROWTH 2 DAYS Performed at [facility], [place] [address], [place],  [address]    Report Status PENDING  Incomplete    Radiology Reports Dg Chest 2 View  Result Date: 10/26/2017 CLINICAL DATA:  77 y/o  M; cough and shortness of breath. EXAM: CHEST - 2 VIEW COMPARISON:  12/26/2010 chest radiograph. FINDINGS: Stable cardiac silhouette given projection and technique. Aortic atherosclerosis with calcification. Pulmonary venous hypertension. Streaky opacity in left lung  base, probably  atelectasis. No pleural effusion or pneumothorax. No acute osseous abnormality is evident. IMPRESSION: Pulmonary venous hypertension. Left basilar atelectasis. Aortic atherosclerosis. Electronically Signed   By: Kristine Garbe M.D.   On: 10/26/2017 13:44   Dg Shoulder Right  Result Date: 10/26/2017 CLINICAL DATA:  No sure if pt fell,very confused,,frozen rt shoulder unable to move both arms up ,,but he complained of top of rt shoulder,, Lt knee swelling ,,and sob,, EXAM: RIGHT SHOULDER - 2+ VIEW COMPARISON:  None. FINDINGS: Glenohumeral joint is intact. No evidence of scapular fracture or humeral fracture. The acromioclavicular joint is intact. There is narrowing of the joint space superiorly. IMPRESSION: 1.  No fracture or dislocation. 2. Narrowing of the joint space superiorly could indicate chronic rotator cuff degeneration. Electronically Signed   By: Suzy Bouchard M.D.   On: 10/26/2017 13:41   Dg Knee Complete 4 Views Left  Result Date: 10/26/2017 CLINICAL DATA:  Atraumatic knee pain and swelling EXAM: LEFT KNEE - COMPLETE 4+ VIEW COMPARISON:  None FINDINGS: Diffuse osseous demineralization. Tiny marginal spurs at medial compartment. Joint spaces fairly well preserved. Large joint effusion. No acute fracture, dislocation, or bone destruction. Scattered atherosclerotic calcifications. IMPRESSION: Large LEFT knee joint effusion without acute bony abnormalities. Electronically Signed   By: Lavonia Dana M.D.   On: 10/26/2017 13:47     CBC Recent Labs  Lab 10/26/17 1329 10/28/17 0448  WBC 3.8* 4.1  HGB 12.0* 10.4*  HCT 36.5* 32.4*  PLT 165 143*  MCV 77.7* 76.1*  MCH 25.5* 24.4*  MCHC 32.9 32.1  RDW 15.9* 15.4  LYMPHSABS 1.1  --   MONOABS 0.4  --   EOSABS 0.0  --   BASOSABS 0.0  --     Chemistries  Recent Labs  Lab 10/26/17 1329 10/26/17 2238 10/28/17 0448  NA 140  --  140  K 4.3  --  3.3*  CL 106  --  103  CO2 29  --  26  GLUCOSE 272*  --  124*  BUN  13  --  8  CREATININE 1.24  --  1.07  CALCIUM 9.2  --  8.5*  AST  --  37  --   ALT  --  29  --   ALKPHOS  --  57  --   BILITOT  --  0.6  --    ------------------------------------------------------------------------------------------------------------------ No results for input(s): CHOL, HDL, LDLCALC, TRIG, CHOLHDL, LDLDIRECT in the last 72 hours.  Lab Results  Component Value Date   HGBA1C 10.0 (H) 10/26/2017   ------------------------------------------------------------------------------------------------------------------ No results for input(s): TSH, T4TOTAL, T3FREE, THYROIDAB in the last 72 hours.  Invalid input(s): FREET3 ------------------------------------------------------------------------------------------------------------------ No results for input(s): VITAMINB12, FOLATE, FERRITIN, TIBC, IRON, RETICCTPCT in the last 72 hours.  Coagulation profile No results for input(s): INR, PROTIME in the last 168 hours.  No results for input(s): DDIMER in the last 72 hours.  Cardiac Enzymes No results for input(s): CKMB, TROPONINI, MYOGLOBIN in the last 168 hours.  Invalid input(s): CK ------------------------------------------------------------------------------------------------------------------ No results found for: BNP   [name] M.D on 10/28/2017 at 5:46 PM  Between 7am to 7pm - Pager - [phone]  After 7pm go to [email or web address] - password [email or web address]  [facility] -  Office  [phone]   Voice Recognition Viviann Spare dictation system was used to create this note, attempts have been made to correct errors. Please contact the author with questions and/or clarifications.

## 2017-10-29 ENCOUNTER — Encounter (HOSPITAL_COMMUNITY): Payer: Self-pay | Admitting: Student

## 2017-10-29 DIAGNOSIS — M130 Polyarthritis, unspecified: Secondary | ICD-10-CM

## 2017-10-29 DIAGNOSIS — R197 Diarrhea, unspecified: Secondary | ICD-10-CM

## 2017-10-29 LAB — COMPREHENSIVE METABOLIC PANEL
ALBUMIN: 2.7 g/dL — AB (ref 3.5–5.0)
ALK PHOS: 58 U/L (ref 38–126)
ALT: 32 U/L (ref 0–44)
AST: 50 U/L — ABNORMAL HIGH (ref 15–41)
Anion gap: 9 (ref 5–15)
BUN: 15 mg/dL (ref 8–23)
CALCIUM: 9.1 mg/dL (ref 8.9–10.3)
CO2: 24 mmol/L (ref 22–32)
CREATININE: 1.25 mg/dL — AB (ref 0.61–1.24)
Chloride: 107 mmol/L (ref 98–111)
GFR calc Af Amer: >60 mL/min (ref >60)
GFR calc non Af Amer: 55 mL/min — ABNORMAL LOW (ref >60)
GLUCOSE: 135 mg/dL — AB (ref 70–99)
Potassium: 3.8 mmol/L (ref 3.5–5.1)
SODIUM: 140 mmol/L (ref 135–145)
Total Bilirubin: 0.6 mg/dL (ref 0.3–1.2)
Total Protein: 6.4 g/dL — ABNORMAL LOW (ref 6.5–8.1)

## 2017-10-29 LAB — GLUCOSE, CAPILLARY
Glucose-Capillary: 79 mg/dL (ref 70–99)
Glucose-Capillary: 105 mg/dL — ABNORMAL HIGH (ref 70–99)
Glucose-Capillary: 129 mg/dL — ABNORMAL HIGH (ref 70–99)
Glucose-Capillary: 141 mg/dL — ABNORMAL HIGH (ref 70–99)

## 2017-10-29 LAB — CBC
HCT: 35.7 % — ABNORMAL LOW (ref 39.0–52.0)
HEMOGLOBIN: 11.4 g/dL — AB (ref 13.0–17.0)
MCH: 24.4 pg — ABNORMAL LOW (ref 26.0–34.0)
MCHC: 31.9 g/dL (ref 30.0–36.0)
MCV: 76.4 fL — ABNORMAL LOW (ref 78.0–100.0)
Platelets: 172 10*3/uL (ref 150–400)
RBC: 4.67 MIL/uL (ref 4.22–5.81)
RDW: 15.7 % — ABNORMAL HIGH (ref 11.5–15.5)
WBC: 5.3 10*3/uL (ref 4.0–10.5)

## 2017-10-29 LAB — ROCKY MTN SPOTTED FVR ABS PNL(IGG+IGM)
RMSF IgG: NEGATIVE
RMSF IgM: 0.12 index (ref 0.00–0.89)

## 2017-10-29 LAB — CULTURE, BLOOD (ROUTINE X 2): SPECIAL REQUESTS: ADEQUATE

## 2017-10-29 MED ORDER — POTASSIUM CHLORIDE CRYS ER 20 MEQ PO TBCR
40.0000 meq | EXTENDED_RELEASE_TABLET | Freq: Once | ORAL | Status: AC
  Administered 2017-10-29: 40 meq via ORAL
  Filled 2017-10-29: qty 2

## 2017-10-29 NOTE — Progress Notes (Signed)
Physical Therapy Treatment Patient Details Name: [name] MRN: [id number] DOB: 1940/07/05 Today's Date: 10/29/2017    History of Present Illness [name] is a 77 y.o. M presenting with diffuse weakness and joint pain. PMH includes HTN, HLD, DM Type II, Bladder CA, Prostate CA, R TKR, and per notes, a potential diagnosis of Sarcoidosis.    PT Comments    Patient with significant improvement with mobility. Demonstrates improved pain control, endurance, and balance. Able to progress to ambulating with no assistive device for a distance of 200 feet with min guard assist. Does continue with slow movements and seemingly difficulty with motor initiation of tasks; unsure if this is motor planning issue vs weakness. Recommended patient use cane/walking stick at home for safety but will continue to progress balance with no assistive device.     Follow Up Recommendations  Home health PT;Supervision for mobility/OOB     Equipment Recommendations  3in1 (PT)    Recommendations for Other Services       Precautions / Restrictions Precautions Precautions: Fall Restrictions Weight Bearing Restrictions: No    Mobility  Bed Mobility Overal bed mobility: Modified Independent             General bed mobility comments: Increased time and effort  Transfers Overall transfer level: Needs assistance Equipment used: Rolling walker (2 wheeled) Transfers: Sit to/from Stand Sit to Stand: Supervision         General transfer comment: Supervision for transition from low bed height  Ambulation/Gait Ambulation/Gait assistance: Min guard Gait Distance (Feet): 200 Feet Assistive device: Rolling walker (2 wheeled);IV Pole;None Gait Pattern/deviations: Step-through pattern;Decreased stride length Gait velocity: decreased   General Gait Details: Initially ambulated first 100 feet with walker, then 50 feet with IV pole, then last 50 feet with no assistive device. Patient requiring min guard  throughout. Able to perform head turns, alternating speeds, and stops/starts with mild unsteadiness; able to self correct without additional physical assistance    Stairs             Wheelchair Mobility    Modified Rankin (Stroke Patients Only)       Balance Overall balance assessment: Needs assistance Sitting-balance support: Feet unsupported;No upper extremity supported Sitting balance-Leahy Scale: Good     Standing balance support: No upper extremity supported;During functional activity Standing balance-Leahy Scale: Good                              Cognition Arousal/Alertness: Awake/alert Behavior During Therapy: WFL for tasks assessed/performed Overall Cognitive Status: Within Functional Limits for tasks assessed                                        Exercises General Exercises - Upper Extremity Shoulder Flexion: AROM;Both;PROM;Supine(Patient with full PROM and AROM to ~90 deg with compensation) General Exercises - Lower Extremity Straight Leg Raises: 10 reps;Both;Supine    General Comments        Pertinent Vitals/Pain Pain Assessment: No/denies pain    Home Living                      Prior Function            PT Goals (current goals can now be found in the care plan section) Acute Rehab PT Goals Patient Stated Goal: To go home PT Goal Formulation: With  patient/family Time For Goal Achievement: 11/11/17 Potential to Achieve Goals: Good Progress towards PT goals: Progressing toward goals    Frequency    Min 3X/week      PT Plan Current plan remains appropriate    Co-evaluation              AM-PAC PT "6 Clicks" Daily Activity  Outcome Measure  Difficulty turning over in bed (including adjusting bedclothes, sheets and blankets)?: A Little Difficulty moving from lying on back to sitting on the side of the bed? : A Lot Difficulty sitting down on and standing up from a chair with arms (e.g.,  wheelchair, bedside commode, etc,.)?: Unable Help needed moving to and from a bed to chair (including a wheelchair)?: A Little Help needed walking in hospital room?: A Little Help needed climbing 3-5 steps with a railing? : A Lot 6 Click Score: 14    End of Session Equipment Utilized During Treatment: Gait belt Activity Tolerance: Patient tolerated treatment well Patient left: with family/visitor present;in bed;with call bell/phone within reach;with bed alarm set   PT Visit Diagnosis: Unsteadiness on feet (R26.81);Other abnormalities of gait and mobility (R26.89);Muscle weakness (generalized) (M62.81);Difficulty in walking, not elsewhere classified (R26.2);Pain Pain - Right/Left: (bilateral) Pain - part of body: Shoulder     Time: 1350-1419 PT Time Calculation (min) (ACUTE ONLY): 29 min  Charges:  $Gait Training: 23-37 mins                    G Codes:      Ellamae Sia, PT, DPT Acute Rehabilitation Services  Pager: [phone]    Willy Eddy 10/29/2017, 2:26 PM

## 2017-10-29 NOTE — Care Management Note (Signed)
Case Management Note  Patient Details  Name: Harnoor Kohles MRN: [id number] Date of Birth: 08-03-1940  Subjective/Objective:    76 yr old gentleman admitted with polyarthalgia.                Action/Plan: Case manager spoke with patient and his wife concerning discharge plan and DME needs. Choice for Home Health agency was offered. Referral was called to Carolynn Comment, [place] liaison. Patient will have family support at discharge. Case manager sent message via amion for MD to enter Face to Face for Home Health PT/OT. CM will continue to monitor.    Expected Discharge Date:    pending              Expected Discharge Plan:  [place]  In-House Referral:  NA  Discharge planning Services  CM Consult  Post Acute Care Choice:  Durable Medical Equipment, Home Health Choice offered to:  Patient, Spouse  DME Arranged:  3-N-1, Walker rolling DME Agency:  [place]:  PT, OT [facility] Agency:  [place]  Status of Service:  In process, will continue to follow  If discussed at Long Length of Stay Meetings, dates discussed:    Additional Comments:  Ninfa Meeker, RN 10/29/2017, 1:14 PM

## 2017-10-29 NOTE — Progress Notes (Signed)
Patient Demographics:    [name], is a 77 y.o. male, DOB - [birth year], OEU:[id number]  Admit date - 10/26/2017   Admitting Physician Janora Norlander, MD  Outpatient Primary MD for the patient is Alan Stains, MD  LOS - 3   Chief Complaint  Patient presents with  . Weakness        Subjective:    [name] today has no fevers, no emesis,  No chest pain, ambulated to bathroom with walker, polymyalgia and polyarthralgia appears to be improving  Assessment  & Plan :    Principal Problem:   Polyarthralgia Active Problems:   Essential hypertension   Type 2 diabetes mellitus with diabetic nephropathy (HCC)   Hyperlipemia   Fever in adult   Mediastinal lymphadenopathy   Effusion of left knee joint  Brief Summary:- 77 y.o. male with medical history significant of HTN, hyperlipidemia, prostate CA, bladder CA, possible sarcoidosis who presented to the ED with c/o 5 days of diffuse joint pain, BUE and BLE weakness, admitted on 10/26/2017 with fevers, polyarthralgia and polymyalgia, 2 out of 4 bottles blood cultures growing methicillin-resistant coagulase-negative staph   Plan:- 1)Fever and Methicillin-Resistant coagulase-Negative staph  Bacteremia---  ????  Contaminant, infectious disease consult appreciated, most likely contaminant, stopped vancomycin and Rocephin, repeat blood cultures 10/28/2017 negative so far, continue doxycycline pending results of [facility] spotted fever  work-up, Ehrlichia antibody panel  and Brucella B burgdorferi  are negative, Parvovirus B19 negative, per infectious disease specialist continue doxycycline for 1 more day, possible discharge on 10/30/2017 if repeat blood cultures remain negative  2)Possible inflammatory versus septic Arthritis--- synovial fluid Gram stain and cell count does not support a diagnosis of septic arthritis ,  ESR is only 23, ID consult  appreciated, septic arthritis deemed to be much less likely,  possible discharge on 10/30/2017 if repeat blood cultures remain negative  3)DM2-last A1c was 10, c/n  metformin 1000 mg twice daily, Use Novolog/Humalog Sliding scale insulin with Accu-Cheks/Fingersticks as ordered, c/n Levemir 25 units qpm,    4)HTN--stable, continue amlodipine 10 mg daily and metoprolol 25 mg twice daily  5) Generalized weakness/debility/polyarthralgia/polymyalgia--- PT eval appreciated,  Code Status : Full code   Disposition Plan  : Home with home health services  Consults  :  ID   DVT Prophylaxis  :  Lovenox   Lab Results  Component Value Date   PLT 172 10/29/2017    Inpatient Medications  Scheduled Meds: . amLODipine  10 mg Oral Daily  . aspirin  81 mg Oral QHS  . donepezil  10 mg Oral QHS  . dorzolamide-timolol  2 drop Right Eye BID  . doxycycline  100 mg Oral Q12H  . enoxaparin (LOVENOX) injection  40 mg Subcutaneous Q24H  . escitalopram  5 mg Oral QHS  . insulin aspart  0-15 Units Subcutaneous TID WC  . insulin aspart  0-5 Units Subcutaneous QHS  . insulin detemir  25 Units Subcutaneous QPM  . latanoprost  1 drop Both Eyes QHS  . mouth rinse  15 mL Mouth Rinse BID  . metFORMIN  1,000 mg Oral BID WC  . metoprolol tartrate  25 mg Oral BID  . omega-3 acid ethyl esters  1 g Oral Daily  .  potassium chloride SA  20 mEq Oral Daily  . prednisoLONE acetate  1 drop Right Eye Daily  . simvastatin  20 mg Oral q1800  . tamsulosin  0.4 mg Oral QHS   Continuous Infusions: . sodium chloride 20 mL/hr at 10/28/17 0945   PRN Meds:.acetaminophen, hydroxypropyl methylcellulose / hypromellose    Anti-infectives (From admission, onward)   Start     Dose/Rate Route Frequency Ordered Stop   10/27/17 1400  cefTRIAXone (ROCEPHIN) 2 g in sodium chloride 0.9 % 100 mL IVPB  Status:  Discontinued     2 g 200 mL/hr over 30 Minutes Intravenous Every 24 hours 10/26/17 1647 10/28/17 1521   10/26/17 2300   doxycycline (VIBRA-TABS) tablet 100 mg     100 mg Oral Every 12 hours 10/26/17 2202     10/26/17 1700  vancomycin (VANCOCIN) IVPB 750 mg/150 ml premix  Status:  Discontinued     750 mg 150 mL/hr over 60 Minutes Intravenous Every 12 hours 10/26/17 1647 10/28/17 1521   10/26/17 1515  cefTRIAXone (ROCEPHIN) 2 g in sodium chloride 0.9 % 100 mL IVPB     2 g 200 mL/hr over 30 Minutes Intravenous  Once 10/26/17 1505 10/26/17 1624        Objective:   Vitals:   10/28/17 2052 10/29/17 0450 10/29/17 0834 10/29/17 1615  BP: (!) 149/63 135/66 136/69 116/61  Pulse: 80 77 72 73  Resp: (!) _0 Temp: 100 F (37.8 C) 99.5 F (37.5 C) 98.4 F (36.9 C) 98.3 F (36.8 C)  TempSrc: Oral Oral Oral Oral  SpO2: 92% 93% 95% 98%  Weight: 80.9 kg (178 lb 5.6 oz)     Height:        Wt Readings from Last 3 Encounters:  10/28/17 80.9 kg (178 lb 5.6 oz)  07/04/16 82.6 kg (182 lb)  06/20/16 83.5 kg (184 lb)     Intake/Output Summary (Last 24 hours) at 10/29/2017 1834 Last data filed at 10/29/2017 1809 Gross per 24 hour  Intake 960 ml  Output 500 ml  Net 460 ml     Physical Exam Gen:- Awake Alert,  In no apparent distress  HEENT:- [place].AT, No sclera icterus Neck-Supple Neck,No JVD,.  Lungs-  CTAB , good air movement CV- S1, S2 normal Abd-  +ve B.Sounds, Abd Soft, No tenderness,    Extremity/Skin:-Improving lt knee swelling and range of motion has improved post aspiration, improving bilateral shoulder discomfort and improving range of motion in both shoulders , no significant erythema of the skin overlying the joints Psych-affect is appropriate, oriented x3 Neuro-no new focal deficits, no tremors   Data Review:   Micro Results Recent Results (from the past 240 hour(s))  Culture, blood (routine x 2)     Status: Abnormal   Collection Time: 10/26/17  1:50 PM  Result Value Ref Range Status   Specimen Description BLOOD RIGHT FOREARM  Final   Special Requests   Final    BOTTLES DRAWN  AEROBIC AND ANAEROBIC Blood Culture adequate volume   Culture  Setup Time   Final    GRAM POSITIVE COCCI IN BOTH AEROBIC AND ANAEROBIC BOTTLES CRITICAL RESULT CALLED TO, READ BACK BY AND VERIFIED WITH: Karlene Einstein PharmD 9:50 10/27/17 ([email or web address])    Culture (A)  Final    STAPHYLOCOCCUS SPECIES (COAGULASE NEGATIVE) THE SIGNIFICANCE OF ISOLATING THIS ORGANISM FROM A SINGLE SET OF BLOOD CULTURES WHEN MULTIPLE SETS ARE DRAWN IS UNCERTAIN. PLEASE NOTIFY THE MICROBIOLOGY DEPARTMENT WITHIN ONE WEEK IF  SPECIATION AND SENSITIVITIES ARE REQUIRED. Performed at [facility], [place] [address] [place], [place] [address]    Report Status 10/29/2017 FINAL  Final  Blood Culture ID Panel (Reflexed)     Status: Abnormal   Collection Time: 10/26/17  1:50 PM  Result Value Ref Range Status   Enterococcus species NOT DETECTED NOT DETECTED Final   Listeria monocytogenes NOT DETECTED NOT DETECTED Final   Staphylococcus species DETECTED (A) NOT DETECTED Final    Comment: Methicillin (oxacillin) resistant coagulase negative staphylococcus. Possible blood culture contaminant (unless isolated from more than one blood culture draw or clinical case suggests pathogenicity). No antibiotic treatment is indicated for blood  culture contaminants. CRITICAL RESULT CALLED TO, READ BACK BY AND VERIFIED WITH: Karlene Einstein PharmD 9:50 10/27/17 (wilsonm)    Staphylococcus aureus NOT DETECTED NOT DETECTED Final   Methicillin resistance DETECTED (A) NOT DETECTED Final    Comment: CRITICAL RESULT CALLED TO, READ BACK BY AND VERIFIED WITH: Karlene Einstein PharmD 9:50 10/27/17 (wilsonm)    Streptococcus species NOT DETECTED NOT DETECTED Final   Streptococcus agalactiae NOT DETECTED NOT DETECTED Final   Streptococcus pneumoniae NOT DETECTED NOT DETECTED Final   Streptococcus pyogenes NOT DETECTED NOT DETECTED Final   Acinetobacter baumannii NOT DETECTED NOT DETECTED Final   Enterobacteriaceae species NOT DETECTED NOT DETECTED Final     Enterobacter cloacae complex NOT DETECTED NOT DETECTED Final   Escherichia coli NOT DETECTED NOT DETECTED Final   Klebsiella oxytoca NOT DETECTED NOT DETECTED Final   Klebsiella pneumoniae NOT DETECTED NOT DETECTED Final   Proteus species NOT DETECTED NOT DETECTED Final   Serratia marcescens NOT DETECTED NOT DETECTED Final   Haemophilus influenzae NOT DETECTED NOT DETECTED Final   Neisseria meningitidis NOT DETECTED NOT DETECTED Final   Pseudomonas aeruginosa NOT DETECTED NOT DETECTED Final   Candida albicans NOT DETECTED NOT DETECTED Final   Candida glabrata NOT DETECTED NOT DETECTED Final   Candida krusei NOT DETECTED NOT DETECTED Final   Candida parapsilosis NOT DETECTED NOT DETECTED Final   Candida tropicalis NOT DETECTED NOT DETECTED Final  Culture, blood (routine x 2)     Status: None (Preliminary result)   Collection Time: 10/26/17  2:31 PM  Result Value Ref Range Status   Specimen Description BLOOD SITE NOT SPECIFIED  Final   Special Requests   Final    BOTTLES DRAWN AEROBIC AND ANAEROBIC Blood Culture results may not be optimal due to an inadequate volume of blood received in culture bottles   Culture   Final    NO GROWTH 3 DAYS Performed at [facility], [address], [place], [place] [address]    Report Status PENDING  Incomplete  Gram stain     Status: None   Collection Time: 10/26/17  3:00 PM  Result Value Ref Range Status   Specimen Description SYNOVIAL LEFT KNEE  Final   Special Requests NONE  Final   Gram Stain   Final    ABUNDANT WBC PRESENT, PREDOMINANTLY MONONUCLEAR NO ORGANISMS SEEN Performed at [facility], [address], [place], [place] [address]    Report Status 10/26/2017 FINAL  Final  Culture, body fluid-bottle     Status: None (Preliminary result)   Collection Time: 10/26/17  3:00 PM  Result Value Ref Range Status   Specimen Description SYNOVIAL LEFT KNEE  Final   Special Requests NONE  Final   Culture   Final    NO GROWTH 3  DAYS Performed at [facility], [place] [address] [place], [place] [address]    Report Status PENDING  Incomplete  Culture, blood (Routine X 2) w Reflex to ID Panel     Status: None (Preliminary result)   Collection Time: 10/28/17  9:45 AM  Result Value Ref Range Status   Specimen Description BLOOD LEFT ANTECUBITAL  Final   Special Requests   Final    BOTTLES DRAWN AEROBIC ONLY Blood Culture adequate volume   Culture   Final    NO GROWTH 1 DAY Performed at [facility], [place] [address] [place], [place] [address]    Report Status PENDING  Incomplete  Culture, blood (Routine X 2) w Reflex to ID Panel     Status: None (Preliminary result)   Collection Time: 10/28/17  9:50 AM  Result Value Ref Range Status   Specimen Description BLOOD LEFT ANTECUBITAL  Final   Special Requests   Final    BOTTLES DRAWN AEROBIC ONLY Blood Culture adequate volume   Culture   Final    NO GROWTH 1 DAY Performed at [facility], [place] [address] [place] [address] [place], [place] [address]    Report Status PENDING  Incomplete    Radiology Reports Dg Chest 2 View  Result Date: 10/26/2017 CLINICAL DATA:  77 y/o  M; cough and shortness of breath. EXAM: CHEST - 2 VIEW COMPARISON:  12/26/2010 chest radiograph. FINDINGS: Stable cardiac silhouette given projection and technique. Aortic atherosclerosis with calcification. Pulmonary venous hypertension. Streaky opacity in left lung base, probably atelectasis. No pleural effusion or pneumothorax. No acute osseous abnormality is evident. IMPRESSION: Pulmonary venous hypertension. Left basilar atelectasis. Aortic atherosclerosis. Electronically Signed   By: Kristine Garbe M.D.   On: 10/26/2017 13:44   Dg Shoulder Right  Result Date: 10/26/2017 CLINICAL DATA:  No sure if pt fell,very confused,,frozen rt shoulder unable to move both arms up ,,but he complained of top of rt shoulder,, Lt knee swelling ,,and sob,, EXAM: RIGHT SHOULDER - 2+ VIEW COMPARISON:  None.  FINDINGS: Glenohumeral joint is intact. No evidence of scapular fracture or humeral fracture. The acromioclavicular joint is intact. There is narrowing of the joint space superiorly. IMPRESSION: 1.  No fracture or dislocation. 2. Narrowing of the joint space superiorly could indicate chronic rotator cuff degeneration. Electronically Signed   By: Suzy Bouchard M.D.   On: 10/26/2017 13:41   Dg Knee Complete 4 Views Left  Result Date: 10/26/2017 CLINICAL DATA:  Atraumatic knee pain and swelling EXAM: LEFT KNEE - COMPLETE 4+ VIEW COMPARISON:  None FINDINGS: Diffuse osseous demineralization. Tiny marginal spurs at medial compartment. Joint spaces fairly well preserved. Large joint effusion. No acute fracture, dislocation, or bone destruction. Scattered atherosclerotic calcifications. IMPRESSION: Large LEFT knee joint effusion without acute bony abnormalities. Electronically Signed   By: Lavonia Dana M.D.   On: 10/26/2017 13:47     CBC Recent Labs  Lab 10/26/17 1329 10/28/17 0448 10/29/17 1430  WBC 3.8* 4.1 5.3  HGB 12.0* 10.4* 11.4*  HCT 36.5* 32.4* 35.7*  PLT 165 143* 172  MCV 77.7* 76.1* 76.4*  MCH 25.5* 24.4* 24.4*  MCHC 32.9 32.1 31.9  RDW 15.9* 15.4 15.7*  LYMPHSABS 1.1  --   --   MONOABS 0.4  --   --   EOSABS 0.0  --   --   BASOSABS 0.0  --   --     Chemistries  Recent Labs  Lab 10/26/17 1329 10/26/17 2238 10/28/17 0448 10/29/17 1430  NA 140  --  140 140  K 4.3  --  3.3* 3.8  CL 106  --  103 107  CO2 29  --  26 24  GLUCOSE 272*  --  124* 135*  BUN 13  --  8 15  CREATININE 1.24  --  1.07 1.25*  CALCIUM 9.2  --  8.5* 9.1  AST  --  37  --  50*  ALT  --  29  --  32  ALKPHOS  --  57  --  58  BILITOT  --  0.6  --  0.6   ------------------------------------------------------------------------------------------------------------------ No results for input(s): CHOL, HDL, LDLCALC, TRIG, CHOLHDL, LDLDIRECT in the last 72 hours.  Lab Results  Component Value Date   HGBA1C  10.0 (H) 10/26/2017   ------------------------------------------------------------------------------------------------------------------ No results for input(s): TSH, T4TOTAL, T3FREE, THYROIDAB in the last 72 hours.  Invalid input(s): FREET3 ------------------------------------------------------------------------------------------------------------------ No results for input(s): VITAMINB12, FOLATE, FERRITIN, TIBC, IRON, RETICCTPCT in the last 72 hours.  Coagulation profile No results for input(s): INR, PROTIME in the last 168 hours.  No results for input(s): DDIMER in the last 72 hours.  Cardiac Enzymes No results for input(s): CKMB, TROPONINI, MYOGLOBIN in the last 168 hours.  Invalid input(s): CK ------------------------------------------------------------------------------------------------------------------ No results found for: BNP   [name] M.D on 10/29/2017 at 6:34 PM  Between 7am to 7pm - Pager - [phone]  After 7pm go to [email or web address] - password [email or web address]  [facility] -  Office  [phone]   Voice Recognition Viviann Spare dictation system was used to create this note, attempts have been made to correct errors. Please contact the author with questions and/or clarifications.

## 2017-10-29 NOTE — Progress Notes (Signed)
[facility] for [facility]ious Disease  Date of Admission:  10/26/2017   Total days of antibiotics 4        Day 4: Doxycycline          ASSESSMENT: [name] is a 77 y.o male presented with acute onset of low-grade fever and polyarthralgias and found to have a left knee effusion. Synovial fluid analysis did not suggest bacterial septic arthritis. He was on broad spectrum antibiotics for two days and remains on doxycycline day 4. He defervesced quickly; however, his temperature went up to 100 F last night. He stated he feels fine today but was somnolent and groggy on exam. He stated he did not sleep well because he 3-4 episodes of diarrhea since midnight. He is not complaining of any more joint pain, denied abdominal pain, nausea and vomiting. He is ambulating on his own and his left knee looks less swollen today.   Mr. Henner serology is negative for RMSF, Lyme disease, ehrlichia, and parvovirus. His acute illness could have been caused by viral infections, postinfectious syndromes or autoimmune disorders; the cause is unclear. We do not recommend further work up. We recommend continuing doxycycline for one more day and discharge tomorrow morning.  PLAN: 1. Continue doxycycline for one more day 2. Discharge home tomorrow   Principal Problem:   Polyarthralgia Active Problems:   Essential hypertension   Type 2 diabetes mellitus with diabetic nephropathy (HCC)   Hyperlipemia   Fever in adult   Mediastinal lymphadenopathy   Effusion of left knee joint   Scheduled Meds: . amLODipine  10 mg Oral Daily  . aspirin  81 mg Oral QHS  . donepezil  10 mg Oral QHS  . dorzolamide-timolol  2 drop Right Eye BID  . doxycycline  100 mg Oral Q12H  . enoxaparin (LOVENOX) injection  40 mg Subcutaneous Q24H  . escitalopram  5 mg Oral QHS  . insulin aspart  0-15 Units Subcutaneous TID WC  . insulin aspart  0-5 Units Subcutaneous QHS  . insulin detemir  25 Units Subcutaneous QPM  .  latanoprost  1 drop Both Eyes QHS  . mouth rinse  15 mL Mouth Rinse BID  . metFORMIN  1,000 mg Oral BID WC  . metoprolol tartrate  25 mg Oral BID  . omega-3 acid ethyl esters  1 g Oral Daily  . potassium chloride SA  20 mEq Oral Daily  . potassium chloride  40 mEq Oral Once  . prednisoLONE acetate  1 drop Right Eye Daily  . simvastatin  20 mg Oral q1800  . tamsulosin  0.4 mg Oral QHS   Continuous Infusions: . sodium chloride 20 mL/hr at 10/28/17 0945   PRN Meds:.acetaminophen, hydroxypropyl methylcellulose / hypromellose   SUBJECTIVE: Mr. Weinkauf was seen and evaluated at the bedside today. He was somnolent and groggy during the examination. He stated he had been up all night having 3-4 episodes of diarrhea since midnight. He denied any abdominal pain, nausea or vomiting. He did not notice any blood in his stool. He stated he feels fine and is no longer having any joint pain. His left knee was less swollen then yesterday and he is fully ambulating on his own. He reported that he felt febrile last night; his temperature went up to 100 F at 7 pm last night but he has been afebrile since then.    Review of Systems: Review of Systems  Constitutional: Negative for chills and fever.  Respiratory: Negative for cough and sputum production.   Cardiovascular: Negative for chest pain and leg swelling.  Gastrointestinal: Positive for diarrhea. Negative for abdominal pain, nausea and vomiting.  Musculoskeletal: Negative for joint pain and myalgias.  Neurological: Negative for headaches.    Allergies  Allergen Reactions  . Ace Inhibitors Other (See Comments)    angioedema  . Allopurinol Rash    OBJECTIVE: Vitals:   10/28/17 1708 10/28/17 2052 10/29/17 0450 10/29/17 0834  BP: 129/72 (!) 149/63 135/66 136/69  Pulse: 71 80 77 72  Resp: 18 (!) 22 16 17   Temp: 97.6 F (36.4 C) 100 F (37.8 C) 99.5 F (37.5 C) 98.4 F (36.9 C)  TempSrc: Oral Oral Oral Oral  SpO2: 95% 92% 93% 95%    Weight:  80.9 kg (178 lb 5.6 oz)    Height:       Body mass index is 24.19 kg/m.  Physical Exam  Cardiovascular: Normal rate, regular rhythm, normal heart sounds and intact distal pulses.  Pulmonary/Chest: Effort normal and breath sounds normal.  Abdominal: Bowel sounds are normal. He exhibits distension.    Lab Results Lab Results  Component Value Date   WBC 4.1 10/28/2017   HGB 10.4 (L) 10/28/2017   HCT 32.4 (L) 10/28/2017   MCV 76.1 (L) 10/28/2017   PLT 143 (L) 10/28/2017    Lab Results  Component Value Date   CREATININE 1.07 10/28/2017   BUN 8 10/28/2017   NA 140 10/28/2017   K 3.3 (L) 10/28/2017   CL 103 10/28/2017   CO2 26 10/28/2017    Lab Results  Component Value Date   ALT 29 10/26/2017   AST 37 10/26/2017   ALKPHOS 57 10/26/2017   BILITOT 0.6 10/26/2017     Microbiology: Recent Results (from the past 240 hour(s))  Culture, blood (routine x 2)     Status: Abnormal   Collection Time: 10/26/17  1:50 PM  Result Value Ref Range Status   Specimen Description BLOOD RIGHT FOREARM  Final   Special Requests   Final    BOTTLES DRAWN AEROBIC AND ANAEROBIC Blood Culture adequate volume   Culture  Setup Time   Final    GRAM POSITIVE COCCI IN BOTH AEROBIC AND ANAEROBIC BOTTLES CRITICAL RESULT CALLED TO, READ BACK BY AND VERIFIED WITH: Karlene Einstein PharmD 9:50 10/27/17 ([email or web address])    Culture (A)  Final    STAPHYLOCOCCUS SPECIES (COAGULASE NEGATIVE) THE SIGNIFICANCE OF ISOLATING THIS ORGANISM FROM A SINGLE SET OF BLOOD CULTURES WHEN MULTIPLE SETS ARE DRAWN IS UNCERTAIN. PLEASE NOTIFY THE MICROBIOLOGY DEPARTMENT WITHIN ONE WEEK IF SPECIATION AND SENSITIVITIES ARE REQUIRED. Performed at [facility], [place] [address] [place], [place] [address]    Report Status 10/29/2017 FINAL  Final  Blood Culture ID Panel (Reflexed)     Status: Abnormal   Collection Time: 10/26/17  1:50 PM  Result Value Ref Range Status   Enterococcus species NOT DETECTED NOT DETECTED Final    Listeria monocytogenes NOT DETECTED NOT DETECTED Final   Staphylococcus species DETECTED (A) NOT DETECTED Final    Comment: Methicillin (oxacillin) resistant coagulase negative staphylococcus. Possible blood culture contaminant (unless isolated from more than one blood culture draw or clinical case suggests pathogenicity). No antibiotic treatment is indicated for blood  culture contaminants. CRITICAL RESULT CALLED TO, READ BACK BY AND VERIFIED WITH: Karlene Einstein PharmD 9:50 10/27/17 (wilsonm)    Staphylococcus aureus NOT DETECTED NOT DETECTED Final   Methicillin resistance DETECTED (A) NOT DETECTED Final  Comment: CRITICAL RESULT CALLED TO, READ BACK BY AND VERIFIED WITH: Karlene Einstein PharmD 9:50 10/27/17 (wilsonm)    Streptococcus species NOT DETECTED NOT DETECTED Final   Streptococcus agalactiae NOT DETECTED NOT DETECTED Final   Streptococcus pneumoniae NOT DETECTED NOT DETECTED Final   Streptococcus pyogenes NOT DETECTED NOT DETECTED Final   Acinetobacter baumannii NOT DETECTED NOT DETECTED Final   Enterobacteriaceae species NOT DETECTED NOT DETECTED Final   Enterobacter cloacae complex NOT DETECTED NOT DETECTED Final   Escherichia coli NOT DETECTED NOT DETECTED Final   Klebsiella oxytoca NOT DETECTED NOT DETECTED Final   Klebsiella pneumoniae NOT DETECTED NOT DETECTED Final   Proteus species NOT DETECTED NOT DETECTED Final   Serratia marcescens NOT DETECTED NOT DETECTED Final   Haemophilus influenzae NOT DETECTED NOT DETECTED Final   Neisseria meningitidis NOT DETECTED NOT DETECTED Final   Pseudomonas aeruginosa NOT DETECTED NOT DETECTED Final   Candida albicans NOT DETECTED NOT DETECTED Final   Candida glabrata NOT DETECTED NOT DETECTED Final   Candida krusei NOT DETECTED NOT DETECTED Final   Candida parapsilosis NOT DETECTED NOT DETECTED Final   Candida tropicalis NOT DETECTED NOT DETECTED Final  Culture, blood (routine x 2)     Status: None (Preliminary result)   Collection  Time: 10/26/17  2:31 PM  Result Value Ref Range Status   Specimen Description BLOOD SITE NOT SPECIFIED  Final   Special Requests   Final    BOTTLES DRAWN AEROBIC AND ANAEROBIC Blood Culture results may not be optimal due to an inadequate volume of blood received in culture bottles   Culture   Final    NO GROWTH 3 DAYS Performed at [facility], [address] [place], [place] [address]    Report Status PENDING  Incomplete  Gram stain     Status: None   Collection Time: 10/26/17  3:00 PM  Result Value Ref Range Status   Specimen Description SYNOVIAL LEFT KNEE  Final   Special Requests NONE  Final   Gram Stain   Final    ABUNDANT WBC PRESENT, PREDOMINANTLY MONONUCLEAR NO ORGANISMS SEEN Performed at [facility], [address] [place], [place] [address]    Report Status 10/26/2017 FINAL  Final  Culture, body fluid-bottle     Status: None (Preliminary result)   Collection Time: 10/26/17  3:00 PM  Result Value Ref Range Status   Specimen Description SYNOVIAL LEFT KNEE  Final   Special Requests NONE  Final   Culture   Final    NO GROWTH 3 DAYS Performed at [place] [address], [place], [place] [address]    Report Status PENDING  Incomplete  Culture, blood (Routine X 2) w Reflex to ID Panel     Status: None (Preliminary result)   Collection Time: 10/28/17  9:45 AM  Result Value Ref Range Status   Specimen Description BLOOD LEFT ANTECUBITAL  Final   Special Requests   Final    BOTTLES DRAWN AEROBIC ONLY Blood Culture adequate volume   Culture   Final    NO GROWTH 1 DAY Performed at [facility], [place] [address], [place], [place] [address]    Report Status PENDING  Incomplete  Culture, blood (Routine X 2) w Reflex to ID Panel     Status: None (Preliminary result)   Collection Time: 10/28/17  9:50 AM  Result Value Ref Range Status   Specimen Description BLOOD LEFT ANTECUBITAL  Final   Special Requests  Final    BOTTLES DRAWN AEROBIC ONLY Blood  Culture adequate volume   Culture   Final    NO GROWTH 1 DAY Performed at [facility], [place] [address] [place],  [address]    Report Status PENDING  Incomplete    Kace Hartje Dutch Quint, [facility]  10/29/2017, 1:45 PM

## 2017-10-29 NOTE — Care Management Important Message (Signed)
Important Message  Patient Details  Name: [name] MRN: [id number] Date of Birth: 1941-02-09   Medicare Important Message Given:  Yes    Orbie Pyo 10/29/2017, 2:16 PM

## 2017-10-30 LAB — CBC
HEMATOCRIT: 33.1 % — AB (ref 39.0–52.0)
HEMOGLOBIN: 10.6 g/dL — AB (ref 13.0–17.0)
MCH: 24.4 pg — ABNORMAL LOW (ref 26.0–34.0)
MCHC: 32 g/dL (ref 30.0–36.0)
MCV: 76.1 fL — AB (ref 78.0–100.0)
Platelets: 181 10*3/uL (ref 150–400)
RBC: 4.35 MIL/uL (ref 4.22–5.81)
RDW: 15.4 % (ref 11.5–15.5)
WBC: 5.2 10*3/uL (ref 4.0–10.5)

## 2017-10-30 LAB — BASIC METABOLIC PANEL
ANION GAP: 8 (ref 5–15)
BUN: 13 mg/dL (ref 8–23)
CALCIUM: 8.9 mg/dL (ref 8.9–10.3)
CO2: 25 mmol/L (ref 22–32)
Chloride: 107 mmol/L (ref 98–111)
Creatinine, Ser: 1.19 mg/dL (ref 0.61–1.24)
GFR calc non Af Amer: 58 mL/min — ABNORMAL LOW (ref >60)
GLUCOSE: 114 mg/dL — AB (ref 70–99)
Potassium: 3.8 mmol/L (ref 3.5–5.1)
Sodium: 140 mmol/L (ref 135–145)

## 2017-10-30 LAB — GLUCOSE, CAPILLARY
Glucose-Capillary: 59 mg/dL — ABNORMAL LOW (ref 70–99)
Glucose-Capillary: 136 mg/dL — ABNORMAL HIGH (ref 70–99)

## 2017-10-30 MED ORDER — AMLODIPINE BESYLATE 10 MG PO TABS: 10.0000 mg | ORAL_TABLET | Freq: Every day | ORAL | 1 refills | Status: AC

## 2017-10-30 MED ORDER — INSULIN REGULAR HUMAN 100 UNIT/ML IJ SOLN: 5.0000 [IU] | Freq: Three times a day (TID) | INTRAMUSCULAR | 2 refills | Status: DC | PRN

## 2017-10-30 MED ORDER — FERROUS SULFATE 325 (65 FE) MG PO TABS: 325.0000 mg | ORAL_TABLET | Freq: Two times a day (BID) | ORAL | 3 refills | Status: AC

## 2017-10-30 MED ORDER — LIRAGLUTIDE 18 MG/3ML ~~LOC~~ SOPN: 1.2000 mg | PEN_INJECTOR | Freq: Every day | SUBCUTANEOUS | 1 refills | Status: DC

## 2017-10-30 MED ORDER — METFORMIN HCL 1000 MG PO TABS: 1000.0000 mg | ORAL_TABLET | Freq: Two times a day (BID) | ORAL | 2 refills | Status: DC

## 2017-10-30 MED ORDER — TAMSULOSIN HCL 0.4 MG PO CAPS: 0.4000 mg | ORAL_CAPSULE | Freq: Every day | ORAL | 1 refills | Status: AC

## 2017-10-30 MED ORDER — SIMVASTATIN 20 MG PO TABS: 20.0000 mg | ORAL_TABLET | Freq: Every day | ORAL | 1 refills | Status: DC

## 2017-10-30 MED ORDER — ACETAMINOPHEN 325 MG PO TABS: 650.0000 mg | ORAL_TABLET | Freq: Four times a day (QID) | ORAL | 1 refills | Status: DC | PRN

## 2017-10-30 MED ORDER — INDOMETHACIN 50 MG PO CAPS: 50.0000 mg | ORAL_CAPSULE | Freq: Two times a day (BID) | ORAL | 1 refills | Status: DC | PRN

## 2017-10-30 MED ORDER — METOPROLOL TARTRATE 25 MG PO TABS: 25.0000 mg | ORAL_TABLET | Freq: Two times a day (BID) | ORAL | 2 refills | Status: DC

## 2017-10-30 MED ORDER — DONEPEZIL HCL 10 MG PO TABS: 10.0000 mg | ORAL_TABLET | Freq: Every day | ORAL | 1 refills | Status: DC

## 2017-10-30 MED ORDER — ESCITALOPRAM OXALATE 5 MG PO TABS: 5.0000 mg | ORAL_TABLET | Freq: Every day | ORAL | 1 refills | Status: DC

## 2017-10-30 MED ORDER — INSULIN DETEMIR 100 UNIT/ML FLEXPEN: PEN_INJECTOR | SUBCUTANEOUS | 1 refills | Status: DC

## 2017-10-30 NOTE — Discharge Instructions (Signed)
1) please note several changes to your medications 2) follow-up with your primary care doctor within a week for recheck

## 2017-10-30 NOTE — Progress Notes (Signed)
Inpatient Diabetes Program Recommendations  AACE/ADA: New Consensus Statement on Inpatient Glycemic Control (2015)  Target Ranges:  Prepandial:   less than 140 mg/dL      Peak postprandial:   less than 180 mg/dL (1-2 hours)      Critically ill patients:  140 - 180 mg/dL   Results for [name], [name] (MRN [id number]) as of 10/30/2017 10:35  Ref. Range 10/29/2017 11:44 10/29/2017 16:56 10/29/2017 22:05 10/30/2017 08:26 10/30/2017 08:52  Glucose-Capillary Latest Ref Range: 70 - 99 mg/dL 105 (H) 129 (H) 141 (H) 59 (L) 136 (H)    Review of Glycemic Control  Diabetes history: DM 2 Outpatient Diabetes medications: Levemir 38 units Daily, Novolog R 8 units Daily at supper, Victoza 1.2 mg Daily, Metformin 500 qam, 1000 qpm Current orders for Inpatient glycemic control: Levemir 25 units, Metformin 500 mg qam, 1000 mg qpm, Novolog 0-15 units tid  Inpatient Diabetes Program Recommendations:    A1c 10% on 7/8  Hypoglycemia 59 this am. Consider decreasing Levemir to 20 units.  Thanks,  Tama Headings RN, MSN, BC-ADM, [facility] Inpatient Diabetes Coordinator Team Pager [phone] (8a-5p)

## 2017-10-30 NOTE — Consult Note (Signed)
            [facility] CM Primary Care Navigator  10/30/2017  [name] 1941/02/16 [id number]   Seen patientand wife [name]) at the bedsideto identify possible discharge needs. Wifereports that patient"could hardly walk due to pain to shoulders and knees" that had led to this admission. (Polyarthralgia, polymyalgia)  PatientendorsesDr. [name] with [facility] as hisprimary care provider.   Patient's wife shared [place] and [facility] serviceto obtain medications without difficulty   Patient's daughter Hinton Dyer- works at [employer]) has beenmanaging hismedications at home using "pill box" system filled weekly.  According to wife, patienthas beendriving prior to admission but wife canprovidetransportation to hisdoctor's appointments after discharge.  Made aware of [employer] transportation benefits as well.  Patient lives with wife who will serve as his primary caregiver at home. Daughter also provides assistance with care needs at home per patient's wife.  Anticipated discharge plan is homewith home health services per  therapy recommendation.   Patientand wife voiced understanding to call primary care provider's office whenhegets backhome, for a post discharge follow-up appointment within1- 2 weeksor sooner if needs arise. Patient letter (with PCP's contact number) provided as theirreminder.   Patient is a 77 year old male with history ofHTN, hyperlipidemia, prostate CA, bladder CA, possible sarcoidosis, type 2 DM (uncontrolled). Hisrecent A1c is 10.0 and is taking Metformin 1000 mg twice daily, uses Novolog/ Humalog Sliding scale insulin with Accu-Cheks/ Fingersticks 3 times a day and Levemir 25 units daily. Inpatient diabetes coordinator is following patient during this hospitalization.   Explained to patient and wife about [facility] care management services available forfurther  health management at home andhadvoiced interest about it ("as long as it's free"). Patient states that wife and daughter had been helping him manage his health conditions at home. Both are aware of diet, medications, exercise and close follow-up with providers (Dr. Chalmers Cater- endocrinologist). Exercise is a work in progress and he needs to be doing it, per wife. He reports monitoring blood sugar several times a day.  Patient acknowledgesneedfor guidance and reinforcement in managing diabetes. Expressed preference to have home visits to assist with managementofDM at home and to monitor adherence with diet restrictions, medications and exercise.  Patient and wifeverbally agreed and [facility] for in-home evaluation, reinforce adherence to diet,medications, exercise and assessment of care needsinmanagingDM at home.  Referral made to [facility] follow-up after discharge.  [facility] care management information provided for future needs that may arise.    For additional questions please contact:  Edwena Felty A. Callan Yontz, BSN, RN-BC [facility] PRIMARY CARE Navigator Cell: [phone]

## 2017-10-30 NOTE — Progress Notes (Signed)
Physical Therapy Treatment Patient Details Name: [name] MRN: [id number] DOB: 08/15/1940 Today's Date: 10/30/2017    History of Present Illness [name] is a 77 y.o. M presenting with diffuse weakness and joint pain. PMH includes HTN, HLD, DM Type II, Bladder CA, Prostate CA, R TKR, and per notes, a potential diagnosis of Sarcoidosis.    PT Comments    Patient is progressing very well towards their physical therapy goals. Continued practice on balance training without an assistive device. Patient requiring min guard assist for ambulating 400 feet with no external support. Occasional scissoring noted with dual tasking and distractions, but patient able to correct. Rest of session focused on functional strength training. Still continues to be slow with all mobility but could likely progress to OPPT fairly quickly once d/c home.      Follow Up Recommendations  Home health PT;Supervision for mobility/OOB     Equipment Recommendations  3in1 (PT)    Recommendations for Other Services       Precautions / Restrictions Precautions Precautions: Fall Restrictions Weight Bearing Restrictions: No    Mobility  Bed Mobility Overal bed mobility: Modified Independent                Transfers Overall transfer level: Needs assistance Equipment used: Rolling walker (2 wheeled) Transfers: Sit to/from Stand Sit to Stand: Supervision         General transfer comment: Supervision for transition from low bed height. Increased time and effort utilizing a wide BOS  Ambulation/Gait Ambulation/Gait assistance: Min guard Gait Distance (Feet): 400 Feet Assistive device: None Gait Pattern/deviations: Step-through pattern;Decreased stride length;Narrow base of support Gait velocity: decreased   General Gait Details: Patient with occasional scissoring with distractions but able to correct without additional assistance. Cues for reciprocal arm swing to promote normal gait pattern.     Stairs             Wheelchair Mobility    Modified [place] (Stroke Patients Only)       Balance Overall balance assessment: Needs assistance Sitting-balance support: Feet unsupported;No upper extremity supported Sitting balance-Leahy Scale: Good     Standing balance support: No upper extremity supported;During functional activity Standing balance-Leahy Scale: Good Standing balance comment: Patient able to statically stand but needs supervision for Rhomberg stance and marching in place         Rhomberg - Eyes Opened: 30                  Cognition Arousal/Alertness: Awake/alert Behavior During Therapy: WFL for tasks assessed/performed Overall Cognitive Status: Within Functional Limits for tasks assessed                                        Exercises Other Exercises Other Exercises: 5 Sit to Stands from low bed height with BUE push off Other Exercises: Static/dynamic standing balance activities including Rhomberg and marching in place Other Exercises: Dynamic gait including varying speeds and negotiating obstacles    General Comments        Pertinent Vitals/Pain Pain Assessment: No/denies pain    Home Living                      Prior Function            PT Goals (current goals can now be found in the care plan section) Acute Rehab PT Goals Patient Stated Goal:  To go home PT Goal Formulation: With patient/family Time For Goal Achievement: 11/11/17 Potential to Achieve Goals: Good Progress towards PT goals: Progressing toward goals    Frequency    Min 3X/week      PT Plan Current plan remains appropriate    Co-evaluation              AM-PAC PT "6 Clicks" Daily Activity  Outcome Measure  Difficulty turning over in bed (including adjusting bedclothes, sheets and blankets)?: A Little Difficulty moving from lying on back to sitting on the side of the bed? : A Lot Difficulty sitting down on and  standing up from a chair with arms (e.g., wheelchair, bedside commode, etc,.)?: Unable Help needed moving to and from a bed to chair (including a wheelchair)?: A Little Help needed walking in hospital room?: A Little Help needed climbing 3-5 steps with a railing? : A Lot 6 Click Score: 14    End of Session Equipment Utilized During Treatment: Gait belt Activity Tolerance: Patient tolerated treatment well Patient left: with family/visitor present;in bed;with call bell/phone within reach;with bed alarm set   PT Visit Diagnosis: Unsteadiness on feet (R26.81);Other abnormalities of gait and mobility (R26.89);Muscle weakness (generalized) (M62.81);Difficulty in walking, not elsewhere classified (R26.2);Pain Pain - Right/Left: (bilateral) Pain - part of body: Shoulder     Time: 6811-5726 PT Time Calculation (min) (ACUTE ONLY): 16 min  Charges:  $Therapeutic Activity: 8-22 mins                    G Codes:       Ellamae Sia, PT, DPT Acute Rehabilitation Services  Pager: [phone]    Willy Eddy 10/30/2017, 10:47 AM

## 2017-10-30 NOTE — Progress Notes (Signed)
[name] to be D/C'd Home per MD order.  Discussed prescriptions and follow up appointments with the patient. Prescriptions given to patient, medication list explained in detail. Pt verbalized understanding.  Allergies as of 10/30/2017      Reactions   Ace Inhibitors Other (See Comments)   angioedema   Allopurinol Rash      Medication List    TAKE these medications   ACCU-CHEK AVIVA PLUS test strip Generic drug:  glucose blood USE AS INSTRUCTED TO CHECK BLOOD SUGAR THREE TIMES DAILY   ACCU-CHEK AVIVA PLUS w/Device Kit Use to check blood sugar 3 times per day dx code E11.65   acetaminophen 325 MG tablet Commonly known as:  TYLENOL Take 2 tablets (650 mg total) by mouth every 6 (six) hours as needed for mild pain, fever or headache.   amLODipine 10 MG tablet Commonly known as:  NORVASC Take 1 tablet (10 mg total) by mouth daily.   aspirin 81 MG tablet Take 81 mg by mouth at bedtime.   donepezil 10 MG tablet Commonly known as:  ARICEPT Take 1 tablet (10 mg total) by mouth at bedtime.   dorzolamide-timolol 22.3-6.8 MG/ML ophthalmic solution Commonly known as:  COSOPT Place 2 drops into the right eye 2 (two) times daily.   escitalopram 5 MG tablet Commonly known as:  LEXAPRO Take 1 tablet (5 mg total) by mouth at bedtime.   ferrous sulfate 325 (65 FE) MG tablet Take 1 tablet (325 mg total) by mouth 2 (two) times daily.   fish oil-omega-3 fatty acids 1000 MG capsule Take 1 g by mouth daily.   hydroxypropyl methylcellulose / hypromellose 2.5 % ophthalmic solution Commonly known as:  ISOPTO TEARS / GONIOVISC Place 1 drop into both eyes 4 (four) times daily as needed for dry eyes.   indomethacin 50 MG capsule Commonly known as:  INDOCIN Take 1 capsule (50 mg total) by mouth 2 (two) times daily as needed (gout). Take with food What changed:    when to take this  additional instructions   Insulin Detemir 100 UNIT/ML Pen Commonly known as:  LEVEMIR  FLEXTOUCH Inject 26 units daily What changed:  additional instructions   insulin regular 100 units/mL injection Commonly known as:  NOVOLIN R RELION Inject 0.05-0.15 mLs (5-15 Units total) into the skin 3 (three) times daily with meals as needed for high blood sugar (per sliding scale).   INSULIN SYRINGE .5CC/31GX5/16" 31G X 5/16" 0.5 ML Misc Use one to inject insulin daily   latanoprost 0.005 % ophthalmic solution Commonly known as:  XALATAN Place 1 drop into both eyes at bedtime.   liraglutide 18 MG/3ML Sopn Commonly known as:  VICTOZA Inject 0.2 mLs (1.2 mg total) into the skin daily.   meclizine 25 MG tablet Commonly known as:  ANTIVERT Take 1 tablet (25 mg total) by mouth 3 (three) times daily as needed for dizziness.   metFORMIN 1000 MG tablet Commonly known as:  GLUCOPHAGE Take 1 tablet (1,000 mg total) by mouth 2 (two) times daily with a meal. What changed:  See the new instructions.   metoprolol tartrate 25 MG tablet Commonly known as:  LOPRESSOR Take 1 tablet (25 mg total) by mouth 2 (two) times daily.   potassium chloride SA 20 MEQ tablet Commonly known as:  K-DUR,KLOR-CON TAKE 1 TABLET EVERY DAY What changed:    how much to take  how to take this  when to take this   PREDNISOLONE ACETATE OP Place 1 drop into the right  eye daily.   simvastatin 20 MG tablet Commonly known as:  ZOCOR Take 1 tablet (20 mg total) by mouth daily at 6 PM.   tamsulosin 0.4 MG Caps capsule Commonly known as:  FLOMAX Take 1 capsule (0.4 mg total) by mouth at bedtime.            Durable Medical Equipment  (From admission, onward)        Start     Ordered   10/29/17 1409  For home use only DME 3 n 1  Once     10/29/17 1408   10/29/17 1401  For home use only DME Walker rolling  Once    Question:  Patient needs a walker to treat with the following condition  Answer:  Generalized weakness   10/29/17 1406      Vitals:   10/30/17 0535 10/30/17 1040  BP: 127/68  127/67  Pulse: 74 65  Resp: 20 20  Temp: 99.2 F (37.3 C) 98.4 F (36.9 C)  SpO2: 95% 96%    Skin clean, dry and intact without evidence of skin break down, no evidence of skin tears noted. IV catheter discontinued intact. Site without signs and symptoms of complications. Dressing and pressure applied. Pt denies pain at this time. No complaints noted.  An After Visit Summary was printed and given to the patient. Patient escorted via [place], and D/C home via private auto.

## 2017-10-30 NOTE — Discharge Summary (Signed)
[name], is a 77 y.o. male  DOB 1940/12/10  MRN [id number].  Admission date:  10/26/2017  Admitting Physician  Janora Norlander, MD  Discharge Date:  10/30/2017   Primary MD  [name], MD  Recommendations for primary care physician for things to follow:  1) please note several changes to your medications 2) follow-up with your primary care doctor within a week for recheck   Admission Diagnosis  Fever in adult [R50.9]   Discharge Diagnosis  Fever in adult [R50.9]    Principal Problem:   Polyarthralgia Active Problems:   Essential hypertension   Type 2 diabetes mellitus with diabetic nephropathy ([place])   Hyperlipemia   Fever in adult   Mediastinal lymphadenopathy   Effusion of left knee joint      Past Medical History:  Diagnosis Date  . Diabetes mellitus    insulin dependent  . Heart disease, unspecified   . Hypercholesterolemia   . Hypertension   . Sarcoidosis   . Unspecified glaucoma(365.9)     Past Surgical History:  Procedure Laterality Date  . CATARACT EXTRACTION    . knee replacement surgery     right     HPI  from the history and physical done on the day of admission:    Chief Complaint: Joint pain  HPI: [name] is a 77 y.o. male with medical history significant of HTN, hyperlipidemia, prostate CA, bladder CA, possible sarcoidosis who presented to the ED with c/o 5 days of diffuse joint pain, BUE and BLE weakness. He was in his usual state of health until Thursday 7/4 when he traveled to [facility] for a family reunion. States that same day he began having diffuse weakness in his arms and legs, and was hardly able to walk. He also developed diffuse joint pain, in both shoulders, elbows, wrists, hands, fingers, hips, and knees, although denies pain in his ankles and feet. He has been sleeping more and has become unable to walk in the last day or 2 due to the weakness and  pain. He reports having chills since Thursday and subjective fevers. He could not tell me exactly what he ate at the reunion but denies eating anything with mayonnaise that had been left out, or raw or undercooked meat/seafood. He has had no sick contacts. He has had no travel out of the country. He reports no tick bites or other bug bites but ED physician reported family saw tick(s) in his car recently. He reports increase in urinary frequency up to once every 30-40 minutes. Denies dysuria, abd pain, N/V/D. No headache or photophobia or meningismus. Has had mild nonproductive cough over the past several days. No chest pain. No dyspnea. He does think that he started having some improvement today in his symptoms.  He has a h/o gout, in his feet. Has not had any flares recently. Was diagnosed with possible sarcoidosis in 2008; most recent chest CT was 10/12/2006 that showed stable mediastinal LAD, recommended followup which does not appear to have been done.  ED Course:  VSS, febrile at 101.5. Gluc 300. R Knee tapped --> 8500 WBC. Gram stain neg. Lyme titers sent. Cultures sent. Septic joint abx started- rocephin and vanc. No target rash.      Hospital Course:    Brief Summary:- 77 y.o.malewith medical history significant ofHTN, hyperlipidemia, prostate CA, bladder CA, possible sarcoidosis who presented to the ED with c/o 5 days of diffuse joint pain, BUE and BLE weakness, admitted on 10/26/2017 with fevers, polyarthralgia and polymyalgia, 2 out of 4 bottles blood cultures growing methicillin-resistant coagulase-negative staph   Plan:- 1)Fever and Methicillin-Resistant coagulase-Negative staph  Bacteremia---  ????  Contaminant, infectious disease consult appreciated, most likely contaminant, treated with vancomycin and Rocephin, repeat blood cultures 10/28/2017 negative so far, stop doxycycline as results of [facility] spotted fever  is  Neg also, Ehrlichia antibody panel  and Brucella B  burgdorferi  are negative, Parvovirus B19 negative, per infectious disease specialist completed doxycycline today, discharge home on 10/30/2017 as repeat blood cultures remain negative  2)Possible inflammatory versus septic Arthritis--- synovial fluid Gram stain and cell count does not support a diagnosis of septic arthritis ,  ESR is only 23, ID consult appreciated, septic arthritis deemed to be much less likely, discharge home on 10/30/2017 if repeat blood cultures remain negative  3)DM2-last A1c was 10, c/n  metformin 1000 mg twice daily, , c/n Levemir     4)HTN--stable, continue amlodipine 10 mg daily and metoprolol 25 mg twice daily  5) Generalized weakness/debility/polyarthralgia/polymyalgia--- PT eval appreciated, Discharge home with home health services  Code Status : Full code   Disposition Plan  : Home with home health services  Consults  :  ID   Discharge Condition: stable  Follow UP  Follow-up Information    [facility].   Specialty:  Home Health Services Why:  A repreasentative from [facility] will contact you to arrange start date and time for your therapy. Contact information: [place] [address] [phone]           Diet and Activity recommendation:  As advised  Discharge Instructions    Discharge Instructions    Call MD for:  difficulty breathing, headache or visual disturbances   Complete by:  As directed    Call MD for:  persistant dizziness or light-headedness   Complete by:  As directed    Call MD for:  persistant nausea and vomiting   Complete by:  As directed    Call MD for:  severe uncontrolled pain   Complete by:  As directed    Call MD for:  temperature >100.4   Complete by:  As directed    Diet - low sodium heart healthy   Complete by:  As directed    Diet Carb Modified   Complete by:  As directed    Discharge instructions   Complete by:  As directed    1) please note  several changes to your medications 2) follow-up with your primary care doctor within a week for recheck   Increase activity slowly   Complete by:  As directed       Discharge Medications     Allergies as of 10/30/2017      Reactions   Ace Inhibitors Other (See Comments)   angioedema   Allopurinol Rash      Medication List    TAKE these medications   ACCU-CHEK AVIVA PLUS test strip Generic drug:  glucose blood USE AS INSTRUCTED  TO CHECK BLOOD SUGAR THREE TIMES DAILY   ACCU-CHEK AVIVA PLUS w/Device Kit Use to check blood sugar 3 times per day dx code E11.65   acetaminophen 325 MG tablet Commonly known as:  TYLENOL Take 2 tablets (650 mg total) by mouth every 6 (six) hours as needed for mild pain, fever or headache.   amLODipine 10 MG tablet Commonly known as:  NORVASC Take 1 tablet (10 mg total) by mouth daily.   aspirin 81 MG tablet Take 81 mg by mouth at bedtime.   donepezil 10 MG tablet Commonly known as:  ARICEPT Take 1 tablet (10 mg total) by mouth at bedtime.   dorzolamide-timolol 22.3-6.8 MG/ML ophthalmic solution Commonly known as:  COSOPT Place 2 drops into the right eye 2 (two) times daily.   escitalopram 5 MG tablet Commonly known as:  LEXAPRO Take 1 tablet (5 mg total) by mouth at bedtime.   ferrous sulfate 325 (65 FE) MG tablet Take 1 tablet (325 mg total) by mouth 2 (two) times daily.   fish oil-omega-3 fatty acids 1000 MG capsule Take 1 g by mouth daily.   hydroxypropyl methylcellulose / hypromellose 2.5 % ophthalmic solution Commonly known as:  ISOPTO TEARS / GONIOVISC Place 1 drop into both eyes 4 (four) times daily as needed for dry eyes.   indomethacin 50 MG capsule Commonly known as:  INDOCIN Take 1 capsule (50 mg total) by mouth 2 (two) times daily as needed (gout). Take with food What changed:    when to take this  additional instructions   Insulin Detemir 100 UNIT/ML Pen Commonly known as:  LEVEMIR FLEXTOUCH Inject 26 units  daily What changed:  additional instructions   insulin regular 100 units/mL injection Commonly known as:  NOVOLIN R RELION Inject 0.05-0.15 mLs (5-15 Units total) into the skin 3 (three) times daily with meals as needed for high blood sugar (per sliding scale).   INSULIN SYRINGE .5CC/31GX5/16" 31G X 5/16" 0.5 ML Misc Use one to inject insulin daily   latanoprost 0.005 % ophthalmic solution Commonly known as:  XALATAN Place 1 drop into both eyes at bedtime.   liraglutide 18 MG/3ML Sopn Commonly known as:  VICTOZA Inject 0.2 mLs (1.2 mg total) into the skin daily.   meclizine 25 MG tablet Commonly known as:  ANTIVERT Take 1 tablet (25 mg total) by mouth 3 (three) times daily as needed for dizziness.   metFORMIN 1000 MG tablet Commonly known as:  GLUCOPHAGE Take 1 tablet (1,000 mg total) by mouth 2 (two) times daily with a meal. What changed:  See the new instructions.   metoprolol tartrate 25 MG tablet Commonly known as:  LOPRESSOR Take 1 tablet (25 mg total) by mouth 2 (two) times daily.   potassium chloride SA 20 MEQ tablet Commonly known as:  K-DUR,KLOR-CON TAKE 1 TABLET EVERY DAY What changed:    how much to take  how to take this  when to take this   PREDNISOLONE ACETATE OP Place 1 drop into the right eye daily.   simvastatin 20 MG tablet Commonly known as:  ZOCOR Take 1 tablet (20 mg total) by mouth daily at 6 PM.   tamsulosin 0.4 MG Caps capsule Commonly known as:  FLOMAX Take 1 capsule (0.4 mg total) by mouth at bedtime.            Durable Medical Equipment  (From admission, onward)        Start     Ordered   10/29/17 1409  For  home use only DME 3 n 1  Once     10/29/17 1408   10/29/17 1401  For home use only DME Walker rolling  Once    Question:  Patient needs a walker to treat with the following condition  Answer:  Generalized weakness   10/29/17 1406      Major procedures and Radiology Reports - PLEASE review detailed and final reports  for all details, in brief -    Dg Chest 2 View  Result Date: 10/26/2017 CLINICAL DATA:  77 y/o  M; cough and shortness of breath. EXAM: CHEST - 2 VIEW COMPARISON:  12/26/2010 chest radiograph. FINDINGS: Stable cardiac silhouette given projection and technique. Aortic atherosclerosis with calcification. Pulmonary venous hypertension. Streaky opacity in left lung base, probably atelectasis. No pleural effusion or pneumothorax. No acute osseous abnormality is evident. IMPRESSION: Pulmonary venous hypertension. Left basilar atelectasis. Aortic atherosclerosis. Electronically Signed   By: Kristine Garbe M.D.   On: 10/26/2017 13:44   Dg Shoulder Right  Result Date: 10/26/2017 CLINICAL DATA:  No sure if pt fell,very confused,,frozen rt shoulder unable to move both arms up ,,but he complained of top of rt shoulder,, Lt knee swelling ,,and sob,, EXAM: RIGHT SHOULDER - 2+ VIEW COMPARISON:  None. FINDINGS: Glenohumeral joint is intact. No evidence of scapular fracture or humeral fracture. The acromioclavicular joint is intact. There is narrowing of the joint space superiorly. IMPRESSION: 1.  No fracture or dislocation. 2. Narrowing of the joint space superiorly could indicate chronic rotator cuff degeneration. Electronically Signed   By: Suzy Bouchard M.D.   On: 10/26/2017 13:41   Dg Knee Complete 4 Views Left  Result Date: 10/26/2017 CLINICAL DATA:  Atraumatic knee pain and swelling EXAM: LEFT KNEE - COMPLETE 4+ VIEW COMPARISON:  None FINDINGS: Diffuse osseous demineralization. Tiny marginal spurs at medial compartment. Joint spaces fairly well preserved. Large joint effusion. No acute fracture, dislocation, or bone destruction. Scattered atherosclerotic calcifications. IMPRESSION: Large LEFT knee joint effusion without acute bony abnormalities. Electronically Signed   By: Lavonia Dana M.D.   On: 10/26/2017 13:47    Micro Results    Recent Results (from the past 240 hour(s))  Culture, blood  (routine x 2)     Status: Abnormal   Collection Time: 10/26/17  1:50 PM  Result Value Ref Range Status   Specimen Description BLOOD RIGHT FOREARM  Final   Special Requests   Final    BOTTLES DRAWN AEROBIC AND ANAEROBIC Blood Culture adequate volume   Culture  Setup Time   Final    GRAM POSITIVE COCCI IN BOTH AEROBIC AND ANAEROBIC BOTTLES CRITICAL RESULT CALLED TO, READ BACK BY AND VERIFIED WITH: Karlene Einstein PharmD 9:50 10/27/17 ([email or web address])    Culture (A)  Final    STAPHYLOCOCCUS SPECIES (COAGULASE NEGATIVE) THE SIGNIFICANCE OF ISOLATING THIS ORGANISM FROM A SINGLE SET OF BLOOD CULTURES WHEN MULTIPLE SETS ARE DRAWN IS UNCERTAIN. PLEASE NOTIFY THE MICROBIOLOGY DEPARTMENT WITHIN ONE WEEK IF SPECIATION AND SENSITIVITIES ARE REQUIRED. Performed at [facility], [place] [address], [place], [place] [address]    Report Status 10/29/2017 FINAL  Final  Blood Culture ID Panel (Reflexed)     Status: Abnormal   Collection Time: 10/26/17  1:50 PM  Result Value Ref Range Status   Enterococcus species NOT DETECTED NOT DETECTED Final   Listeria monocytogenes NOT DETECTED NOT DETECTED Final   Staphylococcus species DETECTED (A) NOT DETECTED Final    Comment: Methicillin (oxacillin) resistant coagulase negative staphylococcus. Possible blood culture  contaminant (unless isolated from more than one blood culture draw or clinical case suggests pathogenicity). No antibiotic treatment is indicated for blood  culture contaminants. CRITICAL RESULT CALLED TO, READ BACK BY AND VERIFIED WITH: Karlene Einstein PharmD 9:50 10/27/17 ([email or web address])    Staphylococcus aureus NOT DETECTED NOT DETECTED Final   Methicillin resistance DETECTED (A) NOT DETECTED Final    Comment: CRITICAL RESULT CALLED TO, READ BACK BY AND VERIFIED WITH: Karlene Einstein PharmD 9:50 10/27/17 ([email or web address])    Streptococcus species NOT DETECTED NOT DETECTED Final   Streptococcus agalactiae NOT DETECTED NOT DETECTED Final   Streptococcus pneumoniae NOT  DETECTED NOT DETECTED Final   Streptococcus pyogenes NOT DETECTED NOT DETECTED Final   Acinetobacter baumannii NOT DETECTED NOT DETECTED Final   Enterobacteriaceae species NOT DETECTED NOT DETECTED Final   Enterobacter cloacae complex NOT DETECTED NOT DETECTED Final   Escherichia coli NOT DETECTED NOT DETECTED Final   Klebsiella oxytoca NOT DETECTED NOT DETECTED Final   Klebsiella pneumoniae NOT DETECTED NOT DETECTED Final   Proteus species NOT DETECTED NOT DETECTED Final   Serratia marcescens NOT DETECTED NOT DETECTED Final   Haemophilus influenzae NOT DETECTED NOT DETECTED Final   Neisseria meningitidis NOT DETECTED NOT DETECTED Final   Pseudomonas aeruginosa NOT DETECTED NOT DETECTED Final   Candida albicans NOT DETECTED NOT DETECTED Final   Candida glabrata NOT DETECTED NOT DETECTED Final   Candida krusei NOT DETECTED NOT DETECTED Final   Candida parapsilosis NOT DETECTED NOT DETECTED Final   Candida tropicalis NOT DETECTED NOT DETECTED Final  Culture, blood (routine x 2)     Status: None (Preliminary result)   Collection Time: 10/26/17  2:31 PM  Result Value Ref Range Status   Specimen Description BLOOD SITE NOT SPECIFIED  Final   Special Requests   Final    BOTTLES DRAWN AEROBIC AND ANAEROBIC Blood Culture results may not be optimal due to an inadequate volume of blood received in culture bottles   Culture   Final    NO GROWTH 3 DAYS Performed at [facility], [address] [place], [place] [address]    Report Status PENDING  Incomplete  Gram stain     Status: None   Collection Time: 10/26/17  3:00 PM  Result Value Ref Range Status   Specimen Description SYNOVIAL LEFT KNEE  Final   Special Requests NONE  Final   Gram Stain   Final    ABUNDANT WBC PRESENT, PREDOMINANTLY MONONUCLEAR NO ORGANISMS SEEN Performed at [facility], [address] [place], [place] [address]    Report Status 10/26/2017 FINAL  Final  Culture, body fluid-bottle     Status: None  (Preliminary result)   Collection Time: 10/26/17  3:00 PM  Result Value Ref Range Status   Specimen Description SYNOVIAL LEFT KNEE  Final   Special Requests NONE  Final   Culture   Final    NO GROWTH 3 DAYS Performed at [place] [address] [place], [place] [address]    Report Status PENDING  Incomplete  Culture, blood (Routine X 2) w Reflex to ID Panel     Status: None (Preliminary result)   Collection Time: 10/28/17  9:45 AM  Result Value Ref Range Status   Specimen Description BLOOD LEFT ANTECUBITAL  Final   Special Requests   Final    BOTTLES DRAWN AEROBIC ONLY Blood Culture adequate volume   Culture   Final    NO GROWTH 1 DAY Performed at [facility], [place] [address], [place], [place] [address]    Report Status PENDING  Incomplete  Culture, blood (Routine X 2) w Reflex to ID Panel     Status: None (Preliminary result)   Collection Time: 10/28/17  9:50 AM  Result Value Ref Range Status   Specimen Description BLOOD LEFT ANTECUBITAL  Final   Special Requests   Final    BOTTLES DRAWN AEROBIC ONLY Blood Culture adequate volume   Culture   Final    NO GROWTH 1 DAY Performed at [facility], [place] [address] [place], [place] [address]    Report Status PENDING  Incomplete       Today   Subjective    Emran Molzahn today has no new complains,  Wife at bedside, polymyalgia and polyarthralgia appears to be improving, No fever  Or chills, No Nausea, Vomiting or Diarrhea        Patient has been seen and examined prior to discharge   Objective   Blood pressure 127/68, pulse 74, temperature 99.2 F (37.3 C), temperature source Oral, resp. rate 20, height 6' (1.829 m), weight 80 kg (176 lb 4.8 oz), SpO2 95 %.   Intake/Output Summary (Last 24 hours) at 10/30/2017 0956 Last data filed at 10/30/2017 0600 Gross per 24 hour  Intake 820 ml  Output 500 ml  Net 320 ml    Exam Gen:- Awake Alert,  In no apparent distress  HEENT:- [place].AT, No sclera  icterus Neck-Supple Neck,No JVD,.  Lungs-  CTAB , good air movement CV- S1, S2 normal Abd-  +ve B.Sounds, Abd Soft, No tenderness,    Extremity/Skin:-Improving lt knee swelling and range of motion has improved post aspiration, improving bilateral shoulder discomfort and improving range of motion in both shoulders , no significant erythema of the skin overlying the joints Psych-affect is appropriate, oriented x3 Neuro-no new focal deficits, no tremors    Data Review   CBC w Diff:  Lab Results  Component Value Date   WBC 5.2 10/30/2017   HGB 10.6 (L) 10/30/2017   HGB 12.2 (L) 09/30/2006   HCT 33.1 (L) 10/30/2017   HCT 37.0 (L) 09/30/2006   PLT 181 10/30/2017   PLT 216 09/30/2006   LYMPHOPCT 29 10/26/2017   LYMPHOPCT 42.2 09/30/2006   MONOPCT 11 10/26/2017   MONOPCT 13.3 (H) 09/30/2006   EOSPCT 0 10/26/2017   EOSPCT 1.0 09/30/2006   BASOPCT 0 10/26/2017   BASOPCT 0.9 09/30/2006    CMP:  Lab Results  Component Value Date   NA 140 10/30/2017   K 3.8 10/30/2017   CL 107 10/30/2017   CO2 25 10/30/2017   BUN 13 10/30/2017   CREATININE 1.19 10/30/2017   PROT 6.4 (L) 10/29/2017   ALBUMIN 2.7 (L) 10/29/2017   BILITOT 0.6 10/29/2017   ALKPHOS 58 10/29/2017   AST 50 (H) 10/29/2017   ALT 32 10/29/2017  .   Total Discharge time is about 33 minutes  [name] M.D on 10/30/2017 at 9:56 AM  [facility]   Office  [phone]

## 2017-10-31 DIAGNOSIS — M25462 Effusion, left knee: Secondary | ICD-10-CM | POA: Diagnosis not present

## 2017-10-31 LAB — CULTURE, BLOOD (ROUTINE X 2): CULTURE: NO GROWTH

## 2017-10-31 LAB — CULTURE, BODY FLUID W GRAM STAIN -BOTTLE: Culture: NO GROWTH

## 2017-10-31 LAB — CULTURE, BODY FLUID-BOTTLE

## 2017-11-02 ENCOUNTER — Other Ambulatory Visit: Payer: Self-pay | Admitting: *Deleted

## 2017-11-02 DIAGNOSIS — Z7982 Long term (current) use of aspirin: Secondary | ICD-10-CM | POA: Diagnosis not present

## 2017-11-02 DIAGNOSIS — M130 Polyarthritis, unspecified: Secondary | ICD-10-CM | POA: Diagnosis not present

## 2017-11-02 DIAGNOSIS — I110 Hypertensive heart disease with heart failure: Secondary | ICD-10-CM | POA: Diagnosis not present

## 2017-11-02 DIAGNOSIS — E785 Hyperlipidemia, unspecified: Secondary | ICD-10-CM | POA: Diagnosis not present

## 2017-11-02 DIAGNOSIS — F0390 Unspecified dementia without behavioral disturbance: Secondary | ICD-10-CM | POA: Diagnosis not present

## 2017-11-02 DIAGNOSIS — D860 Sarcoidosis of lung: Secondary | ICD-10-CM | POA: Diagnosis not present

## 2017-11-02 DIAGNOSIS — Z794 Long term (current) use of insulin: Secondary | ICD-10-CM | POA: Diagnosis not present

## 2017-11-02 DIAGNOSIS — E119 Type 2 diabetes mellitus without complications: Secondary | ICD-10-CM | POA: Diagnosis not present

## 2017-11-02 DIAGNOSIS — I519 Heart disease, unspecified: Secondary | ICD-10-CM | POA: Diagnosis not present

## 2017-11-02 LAB — CULTURE, BLOOD (ROUTINE X 2)
CULTURE: NO GROWTH
CULTURE: NO GROWTH
SPECIAL REQUESTS: ADEQUATE
Special Requests: ADEQUATE

## 2017-11-02 NOTE — Patient Outreach (Signed)
[facility]) Care Management  11/02/2017  [name] 1940/11/10 [id number]   Covering for assigned care manager, referral received from [facility] as member was recently admitted to hospital on 7/8 for fever and complications of diabetes (A1C = 10), discharged on 7/12.  Primary MD office will complete transition of care.  Per chart, he also has history of hypertension, prostate cancer, and hyperlipidemia.  Call placed to member to follow up on health status after discharge.  Member was asleep, however wife/caregiver was able to verify identity.  This care manager introduced self and purpose of call.  [facility] care management services explained.  She report member is doing well, will have appointment with primary MD tomorrow, with Dr. Chalmers Cater within the next week.  Active with [facility] for home health, nursing and PT.    She state he has been checking blood sugars since discharge, today was 139.  Report she manages member's diet, restricting carbohydrates and sugars when she can.  Denies any urgent concerns.  Aware that assigned care manger will follow up within the next week.   [facility] CM Care Plan Problem One     Most Recent Value  Care Plan Problem One  Knowledge deficit regarding management of diabetes as evidenced by elevated A1C  Role Documenting the Problem One  Care Management [place] for Problem One  Active  [facility] Long Term Goal   Member's A1C will be decreased to </= 8.5 within the next 3 months  [facility] Long Term Goal Start Date  11/02/17  Interventions for Problem One Long Term Goal  Discharge instructions reviewed, including medication management and diet in effort to decrease risk for readmission  [facility] CM Short Term Goal #1   Member will keep and attend appointments with primary & endocrinologist within the next 2 weeks  [facility] CM Short Term Goal #1 Start Date  11/02/17  Interventions for Short Term Goal #1  Follow up appointments reviewed  with wife, confirmed member has transportation  [facility] CM Short Term Goal #2   Member will check and record blood sugar readings daily over the next 4 weeks  [facility] CM Short Term Goal #2 Start Date  11/02/17  Interventions for Short Term Goal #2  Importance for daily monitoring of blood sugar discussed with member's wife.      [name], [place], MSN [place] [phone]

## 2017-11-03 DIAGNOSIS — M25512 Pain in left shoulder: Secondary | ICD-10-CM | POA: Diagnosis not present

## 2017-11-03 DIAGNOSIS — E1169 Type 2 diabetes mellitus with other specified complication: Secondary | ICD-10-CM | POA: Diagnosis not present

## 2017-11-03 DIAGNOSIS — R509 Fever, unspecified: Secondary | ICD-10-CM | POA: Diagnosis not present

## 2017-11-03 DIAGNOSIS — Z794 Long term (current) use of insulin: Secondary | ICD-10-CM | POA: Diagnosis not present

## 2017-11-03 DIAGNOSIS — M2550 Pain in unspecified joint: Secondary | ICD-10-CM | POA: Diagnosis not present

## 2017-11-03 DIAGNOSIS — M25511 Pain in right shoulder: Secondary | ICD-10-CM | POA: Diagnosis not present

## 2017-11-06 DIAGNOSIS — E1165 Type 2 diabetes mellitus with hyperglycemia: Secondary | ICD-10-CM | POA: Diagnosis not present

## 2017-11-06 DIAGNOSIS — E7800 Pure hypercholesterolemia, unspecified: Secondary | ICD-10-CM | POA: Diagnosis not present

## 2017-11-06 DIAGNOSIS — I10 Essential (primary) hypertension: Secondary | ICD-10-CM | POA: Diagnosis not present

## 2017-11-09 ENCOUNTER — Other Ambulatory Visit: Payer: Self-pay

## 2017-11-09 DIAGNOSIS — E119 Type 2 diabetes mellitus without complications: Secondary | ICD-10-CM | POA: Diagnosis not present

## 2017-11-09 DIAGNOSIS — Z794 Long term (current) use of insulin: Secondary | ICD-10-CM | POA: Diagnosis not present

## 2017-11-09 DIAGNOSIS — Z7982 Long term (current) use of aspirin: Secondary | ICD-10-CM | POA: Diagnosis not present

## 2017-11-09 DIAGNOSIS — I519 Heart disease, unspecified: Secondary | ICD-10-CM | POA: Diagnosis not present

## 2017-11-09 DIAGNOSIS — E785 Hyperlipidemia, unspecified: Secondary | ICD-10-CM | POA: Diagnosis not present

## 2017-11-09 DIAGNOSIS — I110 Hypertensive heart disease with heart failure: Secondary | ICD-10-CM | POA: Diagnosis not present

## 2017-11-09 DIAGNOSIS — F0390 Unspecified dementia without behavioral disturbance: Secondary | ICD-10-CM | POA: Diagnosis not present

## 2017-11-09 DIAGNOSIS — M130 Polyarthritis, unspecified: Secondary | ICD-10-CM | POA: Diagnosis not present

## 2017-11-09 DIAGNOSIS — D860 Sarcoidosis of lung: Secondary | ICD-10-CM | POA: Diagnosis not present

## 2017-11-09 NOTE — Patient Outreach (Signed)
[facility]) Care Management  11/09/2017  [name] 03/31/1941 [id number]  Referral received from [facility] as member was recently admitted to hospital on 7/8 for fever and complications of diabetes (A1C = 10), discharged on 7/12.  77 year old-Per chart, history of hypertension, prostate cancer, and hyperlipidemia.  RNCM called to schedule a home visit. RNCM spoke with client, however, he deferred the call to his wife to schedule home visit.  Plan: home visit scheduled for this week.  Thea Silversmith, RN, MSN, [place] Coordinator Cell: [phone]

## 2017-11-11 DIAGNOSIS — F0390 Unspecified dementia without behavioral disturbance: Secondary | ICD-10-CM | POA: Diagnosis not present

## 2017-11-11 DIAGNOSIS — I110 Hypertensive heart disease with heart failure: Secondary | ICD-10-CM | POA: Diagnosis not present

## 2017-11-11 DIAGNOSIS — D860 Sarcoidosis of lung: Secondary | ICD-10-CM | POA: Diagnosis not present

## 2017-11-11 DIAGNOSIS — Z794 Long term (current) use of insulin: Secondary | ICD-10-CM | POA: Diagnosis not present

## 2017-11-11 DIAGNOSIS — Z7982 Long term (current) use of aspirin: Secondary | ICD-10-CM | POA: Diagnosis not present

## 2017-11-11 DIAGNOSIS — M130 Polyarthritis, unspecified: Secondary | ICD-10-CM | POA: Diagnosis not present

## 2017-11-11 DIAGNOSIS — I519 Heart disease, unspecified: Secondary | ICD-10-CM | POA: Diagnosis not present

## 2017-11-11 DIAGNOSIS — E785 Hyperlipidemia, unspecified: Secondary | ICD-10-CM | POA: Diagnosis not present

## 2017-11-11 DIAGNOSIS — E119 Type 2 diabetes mellitus without complications: Secondary | ICD-10-CM | POA: Diagnosis not present

## 2017-11-13 ENCOUNTER — Other Ambulatory Visit: Payer: Self-pay

## 2017-11-13 DIAGNOSIS — Z794 Long term (current) use of insulin: Secondary | ICD-10-CM | POA: Diagnosis not present

## 2017-11-13 DIAGNOSIS — F0390 Unspecified dementia without behavioral disturbance: Secondary | ICD-10-CM | POA: Diagnosis not present

## 2017-11-13 DIAGNOSIS — D860 Sarcoidosis of lung: Secondary | ICD-10-CM | POA: Diagnosis not present

## 2017-11-13 DIAGNOSIS — M130 Polyarthritis, unspecified: Secondary | ICD-10-CM | POA: Diagnosis not present

## 2017-11-13 DIAGNOSIS — I519 Heart disease, unspecified: Secondary | ICD-10-CM | POA: Diagnosis not present

## 2017-11-13 DIAGNOSIS — E119 Type 2 diabetes mellitus without complications: Secondary | ICD-10-CM | POA: Diagnosis not present

## 2017-11-13 DIAGNOSIS — E785 Hyperlipidemia, unspecified: Secondary | ICD-10-CM | POA: Diagnosis not present

## 2017-11-13 DIAGNOSIS — Z7982 Long term (current) use of aspirin: Secondary | ICD-10-CM | POA: Diagnosis not present

## 2017-11-13 DIAGNOSIS — I110 Hypertensive heart disease with heart failure: Secondary | ICD-10-CM | POA: Diagnosis not present

## 2017-11-13 NOTE — Patient Outreach (Addendum)
[facility] ([facility]) Care Management   11/13/2017  [name] 04/25/1940 [id number]  [name] is an 76 y.o. male  Subjective: "I am doing ok", but client is also voicing some general joint discomfort.  Objective:  BP 138/64   Pulse 88   Resp 20   Ht 1.829 m (6') Comment: patient reported.  Wt 174 lb (78.9 kg) Comment: patient reported.  SpO2 96%   BMI 23.60 kg/m   Review of Systems  Respiratory:       Lungs clear  Cardiovascular:       Heart beat regular. S1S2 noted.    Physical Exam skin warm dry, color within normal limits, left knee with some edema noted. Client reports fluid was drawn from the knee while he was in the hospital.  Encounter Medications:   Outpatient Encounter Medications as of 11/13/2017  Medication Sig Note  . acetaminophen (TYLENOL) 325 MG tablet Take 2 tablets (650 mg total) by mouth every 6 (six) hours as needed for mild pain, fever or headache.   . amLODipine (NORVASC) 10 MG tablet Take 1 tablet (10 mg total) by mouth daily.   . aspirin 81 MG tablet Take 81 mg by mouth at bedtime.    . donepezil (ARICEPT) 10 MG tablet Take 1 tablet (10 mg total) by mouth at bedtime.   . dorzolamide-timolol (COSOPT) 22.3-6.8 MG/ML ophthalmic solution Place 2 drops into the right eye 2 (two) times daily.   . escitalopram (LEXAPRO) 5 MG tablet Take 1 tablet (5 mg total) by mouth at bedtime.   . ferrous sulfate 325 (65 FE) MG tablet Take 1 tablet (325 mg total) by mouth 2 (two) times daily.   . fish oil-omega-3 fatty acids 1000 MG capsule Take 1 g by mouth daily.    . hydroxypropyl methylcellulose / hypromellose (ISOPTO TEARS / GONIOVISC) 2.5 % ophthalmic solution Place 1 drop into both eyes 4 (four) times daily as needed for dry eyes.    . indomethacin (INDOCIN) 50 MG capsule Take 1 capsule (50 mg total) by mouth 2 (two) times daily as needed (gout). Take with food   . insulin regular (NOVOLIN R RELION) 100 units/mL injection Inject 0.05-0.15 mLs (5-15  Units total) into the skin 3 (three) times daily with meals as needed for high blood sugar (per sliding scale).   . latanoprost (XALATAN) 0.005 % ophthalmic solution Place 1 drop into both eyes at bedtime.    . meclizine (ANTIVERT) 25 MG tablet Take 1 tablet (25 mg total) by mouth 3 (three) times daily as needed for dizziness.   . metFORMIN (GLUCOPHAGE) 1000 MG tablet Take 1 tablet (1,000 mg total) by mouth 2 (two) times daily with a meal. 11/13/2017: Takes 1/2 tablet in the morning and one tablet with dinner.  . metoprolol tartrate (LOPRESSOR) 25 MG tablet Take 1 tablet (25 mg total) by mouth 2 (two) times daily.   . potassium chloride SA (K-DUR,KLOR-CON) 20 MEQ tablet TAKE 1 TABLET EVERY DAY (Patient taking differently: TAKE 1 TABLET (20MEQ) EVERY DAY)   . PREDNISOLONE ACETATE OP Place 1 drop into the right eye daily. 10/26/2017: Unknown strength   . simvastatin (ZOCOR) 20 MG tablet Take 1 tablet (20 mg total) by mouth daily at 6 PM.   . tamsulosin (FLOMAX) 0.4 MG CAPS capsule Take 1 capsule (0.4 mg total) by mouth at bedtime.   . ACCU-CHEK AVIVA PLUS test strip USE AS INSTRUCTED TO CHECK BLOOD SUGAR THREE TIMES DAILY (Patient not taking: Reported on 10/26/2017)   .   Blood Glucose Monitoring Suppl (ACCU-CHEK AVIVA PLUS) w/Device KIT Use to check blood sugar 3 times per day dx code E11.65 (Patient not taking: Reported on 10/26/2017)   . Insulin Detemir (LEVEMIR FLEXTOUCH) 100 UNIT/ML Pen Inject 26 units daily (Patient not taking: Reported on 11/13/2017)   . Insulin Syringe-Needle U-100 (INSULIN SYRINGE .5CC/31GX5/16") 31G X 5/16" 0.5 ML MISC Use one to inject insulin daily (Patient not taking: Reported on 10/26/2017)   . liraglutide (VICTOZA) 18 MG/3ML SOPN Inject 0.2 mLs (1.2 mg total) into the skin daily. (Patient not taking: Reported on 11/13/2017)    No facility-administered encounter medications on file as of 11/13/2017.     Functional Status:   In your present state of health, do you have any difficulty  performing the following activities: 11/13/2017 10/26/2017  Hearing? [place]? N -  Difficulty concentrating or making decisions? N -  Walking or climbing stairs? Y -  Dressing or bathing? N -  Doing errands, shopping? Y N  Preparing Food and eating ? N -  Using the Toilet? N -  In the past six months, have you accidently leaked urine? Y -  Comment urgency -  Do you have problems with loss of bowel control? N -  Managing your Medications? Y -  Managing your Finances? N -  Housekeeping or managing your Housekeeping? N -  Some recent data might be hidden    Fall/Depression Screening:    Fall Risk  11/13/2017  Falls in the past year? No   PHQ 2/9 Scores 11/13/2017  PHQ - 2 Score 0    Assessment:  referral received from [place] as member was recently admitted to hospital on 7/8 for fever and complications of diabetes (A1C = 10), discharged on 7/12.  History of prostate cancer, Dm, HTN, hyperlipidemia, bladder cancer, polyarthralgia, effusion of the left knee joint.  RNCM completed home visit. He reports he has followed up with primary care and has an appointment with endocrinologist.  History of diabetes - Mrs. Kenton reports she has been checking client's blood sugar before each meal. Currently she states client is on sliding scale insulin and oral agents. She reports the plan is when they follow up with endocrinologist with his blood sugar readings then they will go from there. Client was taking Levimir, but she states that it cost $300 and they were unable to afford it so client is not taking. RNCM will refer to pharmacy re: medication assistance.  Plan: pharmacy referral and home visit in two weeks for continued education diabetes management. [facility] CM Care Plan Problem One     Most Recent Value  Care Plan Problem One  Knowledge deficit regarding management of diabetes as evidenced by elevated A1C  Role Documenting the Problem One  Care Management [place] for Problem One  Active  [facility] Long Term Goal   Member's A1C will be decreased to </= 8.5 within the next 3 months  [facility] Long Term Goal Start Date  11/02/17  Interventions for Problem One Long Term Goal  RNCM reviewed client's blood sugar readings with client and his wife, reviewed upcoming appointments and encouraged to follow up. [facility] calender/organizer provided and demonstrated how to use.  [facility] CM Short Term Goal #1   Member will keep and attend appointments with primary & endocrinologist within the next 2 weeks  [facility] CM Short Term Goal #1 Start Date  11/02/17  Interventions for Short Term Goal #1  discussed the importance  of following up with provider.  [facility] CM Short Term Goal #2   Member will check and record blood sugar readings daily over the next 4 weeks  [facility] CM Short Term Goal #2 Start Date  11/02/17  [facility] CM Short Term Goal #2 Met Date  11/13/17  [facility] CM Short Term Goal #3  client/family will reviewed diabetes booklet within the next 30 days.  [facility] CM Short Term Goal #3 Start Date  11/13/17  Interventions for Short Tern Goal #3  RNCM provided diabetes book and encouraged family to review.      , RN, MSN, BSN,CCM [facility] Community Care Coordinator Cell: [phone]      

## 2017-11-16 DIAGNOSIS — Z7982 Long term (current) use of aspirin: Secondary | ICD-10-CM | POA: Diagnosis not present

## 2017-11-16 DIAGNOSIS — I519 Heart disease, unspecified: Secondary | ICD-10-CM | POA: Diagnosis not present

## 2017-11-16 DIAGNOSIS — Z794 Long term (current) use of insulin: Secondary | ICD-10-CM | POA: Diagnosis not present

## 2017-11-16 DIAGNOSIS — M130 Polyarthritis, unspecified: Secondary | ICD-10-CM | POA: Diagnosis not present

## 2017-11-16 DIAGNOSIS — E785 Hyperlipidemia, unspecified: Secondary | ICD-10-CM | POA: Diagnosis not present

## 2017-11-16 DIAGNOSIS — D860 Sarcoidosis of lung: Secondary | ICD-10-CM | POA: Diagnosis not present

## 2017-11-16 DIAGNOSIS — F0390 Unspecified dementia without behavioral disturbance: Secondary | ICD-10-CM | POA: Diagnosis not present

## 2017-11-16 DIAGNOSIS — I110 Hypertensive heart disease with heart failure: Secondary | ICD-10-CM | POA: Diagnosis not present

## 2017-11-16 DIAGNOSIS — E119 Type 2 diabetes mellitus without complications: Secondary | ICD-10-CM | POA: Diagnosis not present

## 2017-11-17 DIAGNOSIS — D860 Sarcoidosis of lung: Secondary | ICD-10-CM | POA: Diagnosis not present

## 2017-11-17 DIAGNOSIS — Z794 Long term (current) use of insulin: Secondary | ICD-10-CM | POA: Diagnosis not present

## 2017-11-17 DIAGNOSIS — I519 Heart disease, unspecified: Secondary | ICD-10-CM | POA: Diagnosis not present

## 2017-11-17 DIAGNOSIS — E1165 Type 2 diabetes mellitus with hyperglycemia: Secondary | ICD-10-CM | POA: Diagnosis not present

## 2017-11-17 DIAGNOSIS — M130 Polyarthritis, unspecified: Secondary | ICD-10-CM | POA: Diagnosis not present

## 2017-11-17 DIAGNOSIS — E119 Type 2 diabetes mellitus without complications: Secondary | ICD-10-CM | POA: Diagnosis not present

## 2017-11-17 DIAGNOSIS — I110 Hypertensive heart disease with heart failure: Secondary | ICD-10-CM | POA: Diagnosis not present

## 2017-11-17 DIAGNOSIS — E785 Hyperlipidemia, unspecified: Secondary | ICD-10-CM | POA: Diagnosis not present

## 2017-11-17 DIAGNOSIS — F0390 Unspecified dementia without behavioral disturbance: Secondary | ICD-10-CM | POA: Diagnosis not present

## 2017-11-17 DIAGNOSIS — I10 Essential (primary) hypertension: Secondary | ICD-10-CM | POA: Diagnosis not present

## 2017-11-17 DIAGNOSIS — Z7982 Long term (current) use of aspirin: Secondary | ICD-10-CM | POA: Diagnosis not present

## 2017-11-17 DIAGNOSIS — E7800 Pure hypercholesterolemia, unspecified: Secondary | ICD-10-CM | POA: Diagnosis not present

## 2017-11-18 ENCOUNTER — Other Ambulatory Visit: Payer: Self-pay | Admitting: Pharmacist

## 2017-11-18 NOTE — Patient Outreach (Signed)
[facility]) Care Management  11/18/2017  [name] 1940/06/13 [id number]   77 year old male referred to [facility] for transition of care services following recent hospitalization for weakness and polyarthralgia.  [place] services requested for medication assistance with insulin.  PMHx includes, but not limited to, HTN, HLD, prostate CA, bladder CA, possible sarcoidosis, gout, and type II diabetes mellitus.    Hemoglobin A1C = 10.0 (10/26/2017)  Successful call placed to [name] today.  Spoke with spouse, [name], regarding medications.  HIPAA identifiers verified.  Verified that spouse is listed on [facility] consent form. Spouse reports that patient saw Dr. Criss Craig yesterday and that Dr. Criss Craig was "very pleased" with patient's blood sugars.  She states Dr. Criss Craig wants patient to continue on metformin (dose increased) and Novolin and there are no plans currently to resume long-acting insulin.  Spouse reports patient received patient assistance for Levemir in the past but this was stopped about 2 years ago.  Spouse is adamant that she is not interested in applying for any type of patient assistance for insulin.  She refuses to review any programs.  She appreciates [facility] offer to assist but declines at this time.    Plan: I will sign off patient case at this time due to spouse declining services.  I am happy to assist in the future as needed.   [name], PharmD, [place] [phone]

## 2017-11-19 DIAGNOSIS — D860 Sarcoidosis of lung: Secondary | ICD-10-CM | POA: Diagnosis not present

## 2017-11-19 DIAGNOSIS — F0390 Unspecified dementia without behavioral disturbance: Secondary | ICD-10-CM | POA: Diagnosis not present

## 2017-11-19 DIAGNOSIS — I110 Hypertensive heart disease with heart failure: Secondary | ICD-10-CM | POA: Diagnosis not present

## 2017-11-19 DIAGNOSIS — E785 Hyperlipidemia, unspecified: Secondary | ICD-10-CM | POA: Diagnosis not present

## 2017-11-19 DIAGNOSIS — Z7982 Long term (current) use of aspirin: Secondary | ICD-10-CM | POA: Diagnosis not present

## 2017-11-19 DIAGNOSIS — M130 Polyarthritis, unspecified: Secondary | ICD-10-CM | POA: Diagnosis not present

## 2017-11-19 DIAGNOSIS — Z794 Long term (current) use of insulin: Secondary | ICD-10-CM | POA: Diagnosis not present

## 2017-11-19 DIAGNOSIS — E119 Type 2 diabetes mellitus without complications: Secondary | ICD-10-CM | POA: Diagnosis not present

## 2017-11-19 DIAGNOSIS — I519 Heart disease, unspecified: Secondary | ICD-10-CM | POA: Diagnosis not present

## 2017-11-20 DIAGNOSIS — E785 Hyperlipidemia, unspecified: Secondary | ICD-10-CM | POA: Diagnosis not present

## 2017-11-20 DIAGNOSIS — I519 Heart disease, unspecified: Secondary | ICD-10-CM | POA: Diagnosis not present

## 2017-11-20 DIAGNOSIS — I110 Hypertensive heart disease with heart failure: Secondary | ICD-10-CM | POA: Diagnosis not present

## 2017-11-20 DIAGNOSIS — M130 Polyarthritis, unspecified: Secondary | ICD-10-CM | POA: Diagnosis not present

## 2017-11-20 DIAGNOSIS — D860 Sarcoidosis of lung: Secondary | ICD-10-CM | POA: Diagnosis not present

## 2017-11-20 DIAGNOSIS — Z794 Long term (current) use of insulin: Secondary | ICD-10-CM | POA: Diagnosis not present

## 2017-11-20 DIAGNOSIS — F0390 Unspecified dementia without behavioral disturbance: Secondary | ICD-10-CM | POA: Diagnosis not present

## 2017-11-20 DIAGNOSIS — Z7982 Long term (current) use of aspirin: Secondary | ICD-10-CM | POA: Diagnosis not present

## 2017-11-20 DIAGNOSIS — E119 Type 2 diabetes mellitus without complications: Secondary | ICD-10-CM | POA: Diagnosis not present

## 2017-11-23 DIAGNOSIS — D860 Sarcoidosis of lung: Secondary | ICD-10-CM | POA: Diagnosis not present

## 2017-11-23 DIAGNOSIS — Z7982 Long term (current) use of aspirin: Secondary | ICD-10-CM | POA: Diagnosis not present

## 2017-11-23 DIAGNOSIS — Z794 Long term (current) use of insulin: Secondary | ICD-10-CM | POA: Diagnosis not present

## 2017-11-23 DIAGNOSIS — I519 Heart disease, unspecified: Secondary | ICD-10-CM | POA: Diagnosis not present

## 2017-11-23 DIAGNOSIS — M130 Polyarthritis, unspecified: Secondary | ICD-10-CM | POA: Diagnosis not present

## 2017-11-23 DIAGNOSIS — E785 Hyperlipidemia, unspecified: Secondary | ICD-10-CM | POA: Diagnosis not present

## 2017-11-23 DIAGNOSIS — E119 Type 2 diabetes mellitus without complications: Secondary | ICD-10-CM | POA: Diagnosis not present

## 2017-11-23 DIAGNOSIS — I110 Hypertensive heart disease with heart failure: Secondary | ICD-10-CM | POA: Diagnosis not present

## 2017-11-23 DIAGNOSIS — F0390 Unspecified dementia without behavioral disturbance: Secondary | ICD-10-CM | POA: Diagnosis not present

## 2017-11-26 DIAGNOSIS — M130 Polyarthritis, unspecified: Secondary | ICD-10-CM | POA: Diagnosis not present

## 2017-11-26 DIAGNOSIS — F0390 Unspecified dementia without behavioral disturbance: Secondary | ICD-10-CM | POA: Diagnosis not present

## 2017-11-26 DIAGNOSIS — E785 Hyperlipidemia, unspecified: Secondary | ICD-10-CM | POA: Diagnosis not present

## 2017-11-26 DIAGNOSIS — D860 Sarcoidosis of lung: Secondary | ICD-10-CM | POA: Diagnosis not present

## 2017-11-26 DIAGNOSIS — Z794 Long term (current) use of insulin: Secondary | ICD-10-CM | POA: Diagnosis not present

## 2017-11-26 DIAGNOSIS — Z7982 Long term (current) use of aspirin: Secondary | ICD-10-CM | POA: Diagnosis not present

## 2017-11-26 DIAGNOSIS — I519 Heart disease, unspecified: Secondary | ICD-10-CM | POA: Diagnosis not present

## 2017-11-26 DIAGNOSIS — E119 Type 2 diabetes mellitus without complications: Secondary | ICD-10-CM | POA: Diagnosis not present

## 2017-11-26 DIAGNOSIS — I110 Hypertensive heart disease with heart failure: Secondary | ICD-10-CM | POA: Diagnosis not present

## 2017-11-27 DIAGNOSIS — I519 Heart disease, unspecified: Secondary | ICD-10-CM | POA: Diagnosis not present

## 2017-11-27 DIAGNOSIS — E119 Type 2 diabetes mellitus without complications: Secondary | ICD-10-CM | POA: Diagnosis not present

## 2017-11-27 DIAGNOSIS — E785 Hyperlipidemia, unspecified: Secondary | ICD-10-CM | POA: Diagnosis not present

## 2017-11-27 DIAGNOSIS — M130 Polyarthritis, unspecified: Secondary | ICD-10-CM | POA: Diagnosis not present

## 2017-11-27 DIAGNOSIS — Z794 Long term (current) use of insulin: Secondary | ICD-10-CM | POA: Diagnosis not present

## 2017-11-27 DIAGNOSIS — D860 Sarcoidosis of lung: Secondary | ICD-10-CM | POA: Diagnosis not present

## 2017-11-27 DIAGNOSIS — F0390 Unspecified dementia without behavioral disturbance: Secondary | ICD-10-CM | POA: Diagnosis not present

## 2017-11-27 DIAGNOSIS — I110 Hypertensive heart disease with heart failure: Secondary | ICD-10-CM | POA: Diagnosis not present

## 2017-11-27 DIAGNOSIS — Z7982 Long term (current) use of aspirin: Secondary | ICD-10-CM | POA: Diagnosis not present

## 2017-11-30 DIAGNOSIS — M130 Polyarthritis, unspecified: Secondary | ICD-10-CM | POA: Diagnosis not present

## 2017-11-30 DIAGNOSIS — I110 Hypertensive heart disease with heart failure: Secondary | ICD-10-CM | POA: Diagnosis not present

## 2017-11-30 DIAGNOSIS — F0390 Unspecified dementia without behavioral disturbance: Secondary | ICD-10-CM | POA: Diagnosis not present

## 2017-11-30 DIAGNOSIS — I519 Heart disease, unspecified: Secondary | ICD-10-CM | POA: Diagnosis not present

## 2017-11-30 DIAGNOSIS — E119 Type 2 diabetes mellitus without complications: Secondary | ICD-10-CM | POA: Diagnosis not present

## 2017-11-30 DIAGNOSIS — Z794 Long term (current) use of insulin: Secondary | ICD-10-CM | POA: Diagnosis not present

## 2017-11-30 DIAGNOSIS — D860 Sarcoidosis of lung: Secondary | ICD-10-CM | POA: Diagnosis not present

## 2017-11-30 DIAGNOSIS — Z7982 Long term (current) use of aspirin: Secondary | ICD-10-CM | POA: Diagnosis not present

## 2017-11-30 DIAGNOSIS — E785 Hyperlipidemia, unspecified: Secondary | ICD-10-CM | POA: Diagnosis not present

## 2017-12-01 ENCOUNTER — Other Ambulatory Visit: Payer: Self-pay

## 2017-12-01 NOTE — Patient Outreach (Signed)
[facility]) Care Management   12/01/2017  [name] 09-Dec-1940 [id number]  [name] is an 77 y.o. male  Subjective: client is without any complaints today.  Objective:  BP 112/64   Pulse 81   Resp 18   SpO2 95%   Review of Systems  Respiratory:       Lung sounds clear.  Cardiovascular:       S1S2 noted, regular    Physical Exam skin warm, dry, color within normal limits.  Encounter Medications:   Outpatient Encounter Medications as of 12/01/2017  Medication Sig Note  . ACCU-CHEK AVIVA PLUS test strip USE AS INSTRUCTED TO CHECK BLOOD SUGAR THREE TIMES DAILY   . acetaminophen (TYLENOL) 325 MG tablet Take 2 tablets (650 mg total) by mouth every 6 (six) hours as needed for mild pain, fever or headache.   Marland Kitchen amLODipine (NORVASC) 10 MG tablet Take 1 tablet (10 mg total) by mouth daily.   Marland Kitchen aspirin 81 MG tablet Take 81 mg by mouth at bedtime.    . Blood Glucose Monitoring Suppl (ACCU-CHEK AVIVA PLUS) w/Device KIT Use to check blood sugar 3 times per day dx code E11.65   . donepezil (ARICEPT) 10 MG tablet Take 1 tablet (10 mg total) by mouth at bedtime.   . dorzolamide-timolol (COSOPT) 22.3-6.8 MG/ML ophthalmic solution Place 1 drop into both eyes 2 (two) times daily.    Marland Kitchen escitalopram (LEXAPRO) 5 MG tablet Take 1 tablet (5 mg total) by mouth at bedtime.   . ferrous sulfate 325 (65 FE) MG tablet Take 1 tablet (325 mg total) by mouth 2 (two) times daily.   . fish oil-omega-3 fatty acids 1000 MG capsule Take 1 g by mouth daily.    . hydroxypropyl methylcellulose / hypromellose (ISOPTO TEARS / GONIOVISC) 2.5 % ophthalmic solution Place 1 drop into both eyes 4 (four) times daily as needed for dry eyes.    . indomethacin (INDOCIN) 50 MG capsule Take 1 capsule (50 mg total) by mouth 2 (two) times daily as needed (gout). Take with food   . Insulin Detemir (LEVEMIR FLEXTOUCH) 100 UNIT/ML Pen Inject 26 units daily (Patient not taking: Reported on 11/13/2017)   . insulin  regular (NOVOLIN R RELION) 100 units/mL injection Inject 0.05-0.15 mLs (5-15 Units total) into the skin 3 (three) times daily with meals as needed for high blood sugar (per sliding scale).   . Insulin Syringe-Needle U-100 (INSULIN SYRINGE .5CC/31GX5/16") 31G X 5/16" 0.5 ML MISC Use one to inject insulin daily   . latanoprost (XALATAN) 0.005 % ophthalmic solution Place 1 drop into both eyes at bedtime.    . liraglutide (VICTOZA) 18 MG/3ML SOPN Inject 0.2 mLs (1.2 mg total) into the skin daily. (Patient not taking: Reported on 11/13/2017)   . meclizine (ANTIVERT) 25 MG tablet Take 1 tablet (25 mg total) by mouth 3 (three) times daily as needed for dizziness. (Patient not taking: Reported on 11/18/2017)   . metFORMIN (GLUCOPHAGE) 1000 MG tablet Take 1 tablet (1,000 mg total) by mouth 2 (two) times daily with a meal.   . metoprolol tartrate (LOPRESSOR) 25 MG tablet Take 1 tablet (25 mg total) by mouth 2 (two) times daily.   . potassium chloride SA (K-DUR,KLOR-CON) 20 MEQ tablet TAKE 1 TABLET EVERY DAY (Patient taking differently: TAKE 1 TABLET (20MEQ) EVERY DAY)   . PREDNISOLONE ACETATE OP Place 1 drop into the right eye daily. 10/26/2017: Unknown strength   . simvastatin (ZOCOR) 20 MG tablet Take 1 tablet (20  mg total) by mouth daily at 6 PM.   . tamsulosin (FLOMAX) 0.4 MG CAPS capsule Take 1 capsule (0.4 mg total) by mouth at bedtime.    No facility-administered encounter medications on file as of 12/01/2017.     Functional Status:   In your present state of health, do you have any difficulty performing the following activities: 11/13/2017 10/26/2017  Hearing? [place]? N -  Difficulty concentrating or making decisions? N -  Walking or climbing stairs? Y -  Dressing or bathing? N -  Doing errands, shopping? Y N  Preparing Food and eating ? N -  Using the Toilet? N -  In the past six months, have you accidently leaked urine? Y -  Comment urgency -  Do you have problems with loss of bowel control?  N -  Managing your Medications? Y -  Managing your Finances? N -  Housekeeping or managing your Housekeeping? N -  Some recent data might be hidden    Fall/Depression Screening:    Fall Risk  11/13/2017  Falls in the past year? No   PHQ 2/9 Scores 11/13/2017  PHQ - 2 Score 0    Assessment:  referral received from [place] as member was recently admitted to hospital on 7/8 for fever and complications of diabetes (A1C = 10), discharged on 7/12. History of prostate cancer, Dm, HTN, hyperlipidemia, bladder cancer, polyarthralgia, effusion of the left knee joint.  RNCM completed home visit. Also present during visit was client's wife. Client is voicing no complaints or concerns.   RNCM has been following up with client on diabetes education reinforcement. Client checks blood sugar and follows up with sliding scale insulin that he reports he has been taking for some time. Client has followed up with Dr. Criss Rosales who is managing his diabetes. Per Ms. Poffenberger Dr. Criss Rosales instructed client taking 1041m metformin twice a day and sliding scale insulin as usual. Per Mrs. Kamrowski, Dr. BCriss Rosaleswill see him in three months to follow up.  Mrs. Sandra reports they do not have access to computer/email. Therefore, RNCM showed both client and Mrs. Koch EMMI video Diabetes: Nutrition and Healthy Eating during home visit, which Mrs. Spearing expressed it helped to reinforce information she knew regarding diabetes and healthy eating.  Plan: RNCM will make home visit next month to continue diabetes education reinforcement. [facility] CM Care Plan Problem One     Most Recent Value  Care Plan Problem One  Knowledge deficit regarding management of diabetes as evidenced by elevated A1C  Role Documenting the Problem One  Care Management [place] Problem [place]  Active  [facility] Long Term Goal   Member's A1C will be decreased to </= 8.5 within the next 3 months  [facility] Long Term Goal Start Date  11/02/17   Interventions for Problem One Long Term Goal  RNCM reviewed medications, blood sugar readings discussed with both client and his wife.  [facility] CM Short Term Goal #1   Member will keep and attend appointments with primary & endocrinologist within the next 2 weeks  [facility] CM Short Term Goal #1 Start Date  11/02/17  [facility] Met Date  12/02/17  [facility] CM Short Term Goal #2   Member will check and record blood sugar readings daily over the next 4 weeks  [facility] CM Short Term Goal #2 Start Date  11/02/17  [facility] Short Term Goal #2 Met Date  11/13/17  [facility]  [facility] Short Term Goal #3  client/family will reviewed diabetes booklet within the next 30 days.  [facility] CM Short Term Goal #3 Start Date  11/13/17  [facility] CM Short Term Goal #3 Met Date  12/01/17  Interventions for Short Tern Goal #3  RNCM encouraged client and his wife to continue to use book as resource/information.  [facility] CM Short Term Goal #4  client will verbalize signs/symptoms of hypoglycemia within the next 30 days.  Interventions for Short Term Goal #4  RNCM discussed with client/wife signs and symptoms of hypoglycemia. used clien'ts recorded readings to reviewe low blood sugar his signs/symptoms associated with the reading and how he treated the low blood sugar.     Thea Silversmith, RN, MSN, [place] Coordinator Cell: [phone]

## 2017-12-02 DIAGNOSIS — Z794 Long term (current) use of insulin: Secondary | ICD-10-CM | POA: Diagnosis not present

## 2017-12-02 DIAGNOSIS — E785 Hyperlipidemia, unspecified: Secondary | ICD-10-CM | POA: Diagnosis not present

## 2017-12-02 DIAGNOSIS — D860 Sarcoidosis of lung: Secondary | ICD-10-CM | POA: Diagnosis not present

## 2017-12-02 DIAGNOSIS — E119 Type 2 diabetes mellitus without complications: Secondary | ICD-10-CM | POA: Diagnosis not present

## 2017-12-02 DIAGNOSIS — I110 Hypertensive heart disease with heart failure: Secondary | ICD-10-CM | POA: Diagnosis not present

## 2017-12-02 DIAGNOSIS — F0390 Unspecified dementia without behavioral disturbance: Secondary | ICD-10-CM | POA: Diagnosis not present

## 2017-12-02 DIAGNOSIS — I519 Heart disease, unspecified: Secondary | ICD-10-CM | POA: Diagnosis not present

## 2017-12-02 DIAGNOSIS — Z7982 Long term (current) use of aspirin: Secondary | ICD-10-CM | POA: Diagnosis not present

## 2017-12-02 DIAGNOSIS — M130 Polyarthritis, unspecified: Secondary | ICD-10-CM | POA: Diagnosis not present

## 2017-12-03 DIAGNOSIS — I10 Essential (primary) hypertension: Secondary | ICD-10-CM | POA: Diagnosis not present

## 2017-12-03 DIAGNOSIS — E785 Hyperlipidemia, unspecified: Secondary | ICD-10-CM | POA: Diagnosis not present

## 2017-12-03 DIAGNOSIS — I110 Hypertensive heart disease with heart failure: Secondary | ICD-10-CM | POA: Diagnosis not present

## 2017-12-03 DIAGNOSIS — D649 Anemia, unspecified: Secondary | ICD-10-CM | POA: Diagnosis not present

## 2017-12-03 DIAGNOSIS — M130 Polyarthritis, unspecified: Secondary | ICD-10-CM | POA: Diagnosis not present

## 2017-12-03 DIAGNOSIS — Z794 Long term (current) use of insulin: Secondary | ICD-10-CM | POA: Diagnosis not present

## 2017-12-03 DIAGNOSIS — I519 Heart disease, unspecified: Secondary | ICD-10-CM | POA: Diagnosis not present

## 2017-12-03 DIAGNOSIS — E1169 Type 2 diabetes mellitus with other specified complication: Secondary | ICD-10-CM | POA: Diagnosis not present

## 2017-12-03 DIAGNOSIS — D860 Sarcoidosis of lung: Secondary | ICD-10-CM | POA: Diagnosis not present

## 2017-12-03 DIAGNOSIS — F320 Major depressive disorder, single episode, mild: Secondary | ICD-10-CM | POA: Diagnosis not present

## 2017-12-03 DIAGNOSIS — E119 Type 2 diabetes mellitus without complications: Secondary | ICD-10-CM | POA: Diagnosis not present

## 2017-12-03 DIAGNOSIS — F0390 Unspecified dementia without behavioral disturbance: Secondary | ICD-10-CM | POA: Diagnosis not present

## 2017-12-03 DIAGNOSIS — Z7982 Long term (current) use of aspirin: Secondary | ICD-10-CM | POA: Diagnosis not present

## 2017-12-03 DIAGNOSIS — R413 Other amnesia: Secondary | ICD-10-CM | POA: Diagnosis not present

## 2017-12-07 DIAGNOSIS — E119 Type 2 diabetes mellitus without complications: Secondary | ICD-10-CM | POA: Diagnosis not present

## 2017-12-07 DIAGNOSIS — I519 Heart disease, unspecified: Secondary | ICD-10-CM | POA: Diagnosis not present

## 2017-12-07 DIAGNOSIS — D860 Sarcoidosis of lung: Secondary | ICD-10-CM | POA: Diagnosis not present

## 2017-12-07 DIAGNOSIS — Z7982 Long term (current) use of aspirin: Secondary | ICD-10-CM | POA: Diagnosis not present

## 2017-12-07 DIAGNOSIS — Z794 Long term (current) use of insulin: Secondary | ICD-10-CM | POA: Diagnosis not present

## 2017-12-07 DIAGNOSIS — F0390 Unspecified dementia without behavioral disturbance: Secondary | ICD-10-CM | POA: Diagnosis not present

## 2017-12-07 DIAGNOSIS — I110 Hypertensive heart disease with heart failure: Secondary | ICD-10-CM | POA: Diagnosis not present

## 2017-12-07 DIAGNOSIS — M130 Polyarthritis, unspecified: Secondary | ICD-10-CM | POA: Diagnosis not present

## 2017-12-07 DIAGNOSIS — E785 Hyperlipidemia, unspecified: Secondary | ICD-10-CM | POA: Diagnosis not present

## 2017-12-09 DIAGNOSIS — D860 Sarcoidosis of lung: Secondary | ICD-10-CM | POA: Diagnosis not present

## 2017-12-09 DIAGNOSIS — E119 Type 2 diabetes mellitus without complications: Secondary | ICD-10-CM | POA: Diagnosis not present

## 2017-12-09 DIAGNOSIS — F0390 Unspecified dementia without behavioral disturbance: Secondary | ICD-10-CM | POA: Diagnosis not present

## 2017-12-09 DIAGNOSIS — E785 Hyperlipidemia, unspecified: Secondary | ICD-10-CM | POA: Diagnosis not present

## 2017-12-09 DIAGNOSIS — Z7982 Long term (current) use of aspirin: Secondary | ICD-10-CM | POA: Diagnosis not present

## 2017-12-09 DIAGNOSIS — M130 Polyarthritis, unspecified: Secondary | ICD-10-CM | POA: Diagnosis not present

## 2017-12-09 DIAGNOSIS — I519 Heart disease, unspecified: Secondary | ICD-10-CM | POA: Diagnosis not present

## 2017-12-09 DIAGNOSIS — Z794 Long term (current) use of insulin: Secondary | ICD-10-CM | POA: Diagnosis not present

## 2017-12-09 DIAGNOSIS — I110 Hypertensive heart disease with heart failure: Secondary | ICD-10-CM | POA: Diagnosis not present

## 2017-12-14 DIAGNOSIS — I519 Heart disease, unspecified: Secondary | ICD-10-CM | POA: Diagnosis not present

## 2017-12-14 DIAGNOSIS — Z794 Long term (current) use of insulin: Secondary | ICD-10-CM | POA: Diagnosis not present

## 2017-12-14 DIAGNOSIS — E119 Type 2 diabetes mellitus without complications: Secondary | ICD-10-CM | POA: Diagnosis not present

## 2017-12-14 DIAGNOSIS — E785 Hyperlipidemia, unspecified: Secondary | ICD-10-CM | POA: Diagnosis not present

## 2017-12-14 DIAGNOSIS — D860 Sarcoidosis of lung: Secondary | ICD-10-CM | POA: Diagnosis not present

## 2017-12-14 DIAGNOSIS — Z7982 Long term (current) use of aspirin: Secondary | ICD-10-CM | POA: Diagnosis not present

## 2017-12-14 DIAGNOSIS — M130 Polyarthritis, unspecified: Secondary | ICD-10-CM | POA: Diagnosis not present

## 2017-12-14 DIAGNOSIS — F0390 Unspecified dementia without behavioral disturbance: Secondary | ICD-10-CM | POA: Diagnosis not present

## 2017-12-14 DIAGNOSIS — I110 Hypertensive heart disease with heart failure: Secondary | ICD-10-CM | POA: Diagnosis not present

## 2017-12-15 ENCOUNTER — Ambulatory Visit: Payer: Medicare HMO | Admitting: Cardiology

## 2017-12-15 ENCOUNTER — Encounter: Payer: Self-pay | Admitting: Cardiology

## 2017-12-15 VITALS — BP 116/58 | HR 68 | Ht 72.0 in | Wt 174.6 lb

## 2017-12-15 DIAGNOSIS — I10 Essential (primary) hypertension: Secondary | ICD-10-CM

## 2017-12-15 DIAGNOSIS — E119 Type 2 diabetes mellitus without complications: Secondary | ICD-10-CM | POA: Diagnosis not present

## 2017-12-15 DIAGNOSIS — T783XXS Angioneurotic edema, sequela: Secondary | ICD-10-CM | POA: Diagnosis not present

## 2017-12-15 DIAGNOSIS — E7800 Pure hypercholesterolemia, unspecified: Secondary | ICD-10-CM

## 2017-12-15 NOTE — Progress Notes (Signed)
[place]. [address], [address], [place]  [address] Phone: [phone] Fax:  [phone]  Date:  12/15/2017   ID:  [name], DOB [birth year], MRN [id number]  PCP:  Alan Stains, MD   History of Present Illness: [name] is a 77 y.o. male here for follow-up, prior patient of Dr. Melvern Banker. He also has comorbidities of diabetes, hypertension, chronic kidney disease stage II, iron deficiency anemia.  Previously, old myocardial infarction-prior CAD listed on medical records. He believes that he had a stress test previously several years ago. He does not recall having a cardiac catheterization, stent placement. Echocardiogram in 2015 thankfully did not show any signs of infarction, normal EF.  He does recall an incident that he had once when he was sitting at a table and became "blank". Did not fall over or lose consciousness. He has not had any episodes since.  During his prior visit with me, ACE inhibitor was stopped because of angioedema.  12/15/2017- overall doing well, no fevers chills nausea vomiting syncope bleeding orthopnea.  He was in the hospital in July 2019 with fever.  Thankfully he is doing much better now.  No cardiac complaints.  Blood pressure under excellent control.  Continues with low-dose aspirin given his high risk for underlying CAD with his diabetes.  Hemoglobin A1c was 10.  Working with Dr. Criss Rosales.  Wt Readings from Last 3 Encounters:  12/15/17 174 lb 9.6 oz (79.2 kg)  11/13/17 174 lb (78.9 kg)  10/30/17 176 lb 4.8 oz (80 kg)     Past Medical History:  Diagnosis Date  . Diabetes mellitus    insulin dependent  . Heart disease, unspecified   . Hypercholesterolemia   . Hypertension   . Sarcoidosis   . Unspecified glaucoma(365.9)     Past Surgical History:  Procedure Laterality Date  . CATARACT EXTRACTION    . knee replacement surgery     right    Current Outpatient Medications  Medication Sig Dispense Refill  . ACCU-CHEK AVIVA PLUS test  strip USE AS INSTRUCTED TO CHECK BLOOD SUGAR THREE TIMES DAILY 300 each 1  . acetaminophen (TYLENOL) 325 MG tablet Take 2 tablets (650 mg total) by mouth every 6 (six) hours as needed for mild pain, fever or headache. 20 tablet 1  . amLODipine (NORVASC) 10 MG tablet Take 1 tablet (10 mg total) by mouth daily. 30 tablet 1  . aspirin 81 MG tablet Take 81 mg by mouth at bedtime.     . Blood Glucose Monitoring Suppl (ACCU-CHEK AVIVA PLUS) w/Device KIT Use to check blood sugar 3 times per day dx code E11.65 1 kit 0  . donepezil (ARICEPT) 10 MG tablet Take 1 tablet (10 mg total) by mouth at bedtime. 30 tablet 1  . dorzolamide-timolol (COSOPT) 22.3-6.8 MG/ML ophthalmic solution Place 1 drop into both eyes 2 (two) times daily.     Marland Kitchen escitalopram (LEXAPRO) 5 MG tablet Take 1 tablet (5 mg total) by mouth at bedtime. 30 tablet 1  . ferrous sulfate 325 (65 FE) MG tablet Take 1 tablet (325 mg total) by mouth 2 (two) times daily. 60 tablet 3  . fish oil-omega-3 fatty acids 1000 MG capsule Take 1 g by mouth daily.     . hydroxypropyl methylcellulose / hypromellose (ISOPTO TEARS / GONIOVISC) 2.5 % ophthalmic solution Place 1 drop into both eyes 4 (four) times daily as needed for dry eyes.     . indomethacin (INDOCIN) 50 MG capsule Take  1 capsule (50 mg total) by mouth 2 (two) times daily as needed (gout). Take with food 20 capsule 1  . Insulin Detemir (LEVEMIR FLEXTOUCH) 100 UNIT/ML Pen Inject 26 units daily 20 pen 1  . insulin regular (NOVOLIN R RELION) 100 units/mL injection Inject 0.05-0.15 mLs (5-15 Units total) into the skin 3 (three) times daily with meals as needed for high blood sugar (per sliding scale). 10 mL 2  . Insulin Syringe-Needle U-100 (INSULIN SYRINGE .5CC/31GX5/16") 31G X 5/16" 0.5 ML MISC Use one to inject insulin daily 30 each 1  . latanoprost (XALATAN) 0.005 % ophthalmic solution Place 1 drop into both eyes at bedtime.     . liraglutide (VICTOZA) 18 MG/3ML SOPN Inject 0.2 mLs (1.2 mg total)  into the skin daily. 15 pen 1  . meclizine (ANTIVERT) 25 MG tablet Take 1 tablet (25 mg total) by mouth 3 (three) times daily as needed for dizziness. 30 tablet 0  . metFORMIN (GLUCOPHAGE) 1000 MG tablet Take 1 tablet (1,000 mg total) by mouth 2 (two) times daily with a meal. 60 tablet 2  . metoprolol tartrate (LOPRESSOR) 25 MG tablet Take 1 tablet (25 mg total) by mouth 2 (two) times daily. 60 tablet 2  . PREDNISOLONE ACETATE OP Place 1 drop into the right eye daily.    . simvastatin (ZOCOR) 20 MG tablet Take 1 tablet (20 mg total) by mouth daily at 6 PM. 30 tablet 1  . tamsulosin (FLOMAX) 0.4 MG CAPS capsule Take 1 capsule (0.4 mg total) by mouth at bedtime. 30 capsule 1   No current facility-administered medications for this visit.     Allergies:    Allergies  Allergen Reactions  . Ace Inhibitors Other (See Comments)    angioedema  . Allopurinol Rash    Social History:  The patient  reports that he quit smoking about 12 years ago. His smoking use included cigarettes. He smoked 0.50 packs per day. He has never used smokeless tobacco. He reports that he does not drink alcohol or use drugs.   Family History  Problem Relation Age of Onset  . Hypertension Mother   . Diabetes Mellitus I Mother   . Hypertension Father   . Coronary artery disease Father   . Hypertension Sister   . Hypertension Brother   . Coronary artery disease Brother   . Diabetes Mellitus I Brother   . Diabetes Mellitus I Brother   . Cancer Neg Hx    brother died with coronary artery disease, lung cancer. He has 7 total siblings.  ROS:  Please see the history of present illness.    PHYSICAL EXAM: VS:  BP (!) 116/58   Pulse 68   Ht 6' (1.829 m)   Wt 174 lb 9.6 oz (79.2 kg)   SpO2 95%   BMI 23.68 kg/m  GEN: Well nourished, well developed, in no acute distress  HEENT: normal  Neck: no JVD, carotid bruits, or masses Cardiac: RRR; no murmurs, rubs, or gallops,no edema  Respiratory:  clear to auscultation  bilaterally, normal work of breathing GI: soft, nontender, nondistended, + BS MS: no deformity or atrophy  Skin: warm and dry, no rash Neuro:  Alert and Oriented x 3, Strength and sensation are intact Psych: euthymic mood, full affect   EKG:  EKG was ordered today 07/11/15-sinus rhythm, 85, nonspecific ST-T wave change personally viewed-no specific change from prior Sinus rhythm, 90, nonspecific ST-T wave changes, LVH borderline Labs: Glucose 113, creatinine 1.08, potassium 3.9, sodium 140, hemoglobin  14.2, platelets 174, uric acid 9.1 mildly elevated, hemoglobin A1c 6.9, LDL 83   ECHO: 02/06/14: - Left ventricle: The cavity size was normal. There was mild focal basal hypertrophy of the septum. Systolic function was normal. The estimated ejection fraction was in the range of 60% to 65%. Wall motion was normal; there were no regional wall motion abnormalities. Doppler parameters are consistent with abnormal left ventricular relaxation (grade 1 diastolic dysfunction). - Mitral valve: There was systolic anterior motion of the chordal structures.  ASSESSMENT AND PLAN:  1. Angioedema-previously noted during a clinic encounter. He should not be prescribed an ACE inhibitor in the future nor should he be prescribed angiotensin receptor blocker.  No further recurrences.  Doing well. 2. I have taken coronary artery disease off of his past medical history-has a history of this is been carried on his past medical history. He he believes that he is had a stress test in the past but does not recall having a cardiac catheterization. Echocardiogram was reassuring. I looked through old [place] records and do not see a Dr. Melvern Banker visit.  Overall stable.  No anginal symptoms. 3. Hypertension-currently well controlled.  Medications reviewed. 4. Hyperlipidemia-continue with statin therapy.  Aggressive prevention efforts. 5. Diabetes with chronic kidney disease stage II- Well controlled. Dr. Criss Rosales.    6. He would like to be seen next year.Bobby Rumpf, MD [facility]  12/15/2017 11:39 AM

## 2017-12-15 NOTE — Patient Instructions (Signed)

## 2018-01-05 ENCOUNTER — Other Ambulatory Visit: Payer: Self-pay

## 2018-01-05 NOTE — Patient Outreach (Signed)
[facility] [facility]) Care Management   01/05/2018  [name] Feb 10, 1941 [id number]  [name] is an 77 y.o. male  Subjective: client without any questions or complaints.  Objective:  BP 122/60   Pulse 79   Resp 20   SpO2 92%   Review of Systems  Respiratory:       Lungs clear, decreased.  Cardiovascular:       S1S2 noted, regular   Physical Exam skin warm dry, color within normal limits.  Encounter Medications:   Outpatient Encounter Medications as of 01/05/2018  Medication Sig Note  . acetaminophen (TYLENOL) 325 MG tablet Take 2 tablets (650 mg total) by mouth every 6 (six) hours as needed for mild pain, fever or headache.   Marland Kitchen amLODipine (NORVASC) 10 MG tablet Take 1 tablet (10 mg total) by mouth daily.   Marland Kitchen aspirin 81 MG tablet Take 81 mg by mouth at bedtime.    . donepezil (ARICEPT) 10 MG tablet Take 1 tablet (10 mg total) by mouth at bedtime.   . dorzolamide-timolol (COSOPT) 22.3-6.8 MG/ML ophthalmic solution Place 1 drop into both eyes 2 (two) times daily.    Marland Kitchen escitalopram (LEXAPRO) 5 MG tablet Take 1 tablet (5 mg total) by mouth at bedtime.   . ferrous sulfate 325 (65 FE) MG tablet Take 1 tablet (325 mg total) by mouth 2 (two) times daily.   . fish oil-omega-3 fatty acids 1000 MG capsule Take 1 g by mouth daily.    . hydroxypropyl methylcellulose / hypromellose (ISOPTO TEARS / GONIOVISC) 2.5 % ophthalmic solution Place 1 drop into both eyes 4 (four) times daily as needed for dry eyes.    . indomethacin (INDOCIN) 50 MG capsule Take 1 capsule (50 mg total) by mouth 2 (two) times daily as needed (gout). Take with food   . insulin regular (NOVOLIN R RELION) 100 units/mL injection Inject 0.05-0.15 mLs (5-15 Units total) into the skin 3 (three) times daily with meals as needed for high blood sugar (per sliding scale).   Marland Kitchen latanoprost (XALATAN) 0.005 % ophthalmic solution Place 1 drop into both eyes at bedtime.    . meclizine (ANTIVERT) 25 MG tablet Take 1 tablet  (25 mg total) by mouth 3 (three) times daily as needed for dizziness.   . metFORMIN (GLUCOPHAGE) 1000 MG tablet Take 1 tablet (1,000 mg total) by mouth 2 (two) times daily with a meal.   . metoprolol tartrate (LOPRESSOR) 25 MG tablet Take 1 tablet (25 mg total) by mouth 2 (two) times daily.   Marland Kitchen PREDNISOLONE ACETATE OP Place 1 drop into the right eye daily. 10/26/2017: Unknown strength   . simvastatin (ZOCOR) 20 MG tablet Take 1 tablet (20 mg total) by mouth daily at 6 PM.   . tamsulosin (FLOMAX) 0.4 MG CAPS capsule Take 1 capsule (0.4 mg total) by mouth at bedtime.   Marland Kitchen ACCU-CHEK AVIVA PLUS test strip USE AS INSTRUCTED TO CHECK BLOOD SUGAR THREE TIMES DAILY   . Blood Glucose Monitoring Suppl (ACCU-CHEK AVIVA PLUS) w/Device KIT Use to check blood sugar 3 times per day dx code E11.65   . Insulin Detemir (LEVEMIR FLEXTOUCH) 100 UNIT/ML Pen Inject 26 units daily (Patient not taking: Reported on 01/05/2018)   . Insulin Syringe-Needle U-100 (INSULIN SYRINGE .5CC/31GX5/16") 31G X 5/16" 0.5 ML MISC Use one to inject insulin daily   . liraglutide (VICTOZA) 18 MG/3ML SOPN Inject 0.2 mLs (1.2 mg total) into the skin daily. (Patient not taking: Reported on 01/05/2018)    No  facility-administered encounter medications on file as of 01/05/2018.     Functional Status:   In your present state of health, do you have any difficulty performing the following activities: 11/13/2017 10/26/2017  Hearing? [place]? N -  Difficulty concentrating or making decisions? N -  Walking or climbing stairs? Y -  Dressing or bathing? N -  Doing errands, shopping? Y N  Preparing Food and eating ? N -  Using the Toilet? N -  In the past six months, have you accidently leaked urine? Y -  Comment urgency -  Do you have problems with loss of bowel control? N -  Managing your Medications? Y -  Managing your Finances? N -  Housekeeping or managing your Housekeeping? N -  Some recent data might be hidden    Fall/Depression  Screening:    Fall Risk  11/13/2017  Falls in the past year? No   PHQ 2/9 Scores 11/13/2017  PHQ - 2 Score 0    Assessment:   77 year old with history of prostate cancer, DM, HTN, hyperlipidemia, bladder cancer, polyarthralgia, effusion of the left knee joint. . Routine Home visit completed to continue diabetes education. Attempted to show EMMI video, however the system was not working.   RNCM discussed continued disease management of diabetes with Healthcoach. Client is agreeable.  Plan: transition to health coach. [facility] CM Care Plan Problem One     Most Recent Value  Care Plan Problem One  Knowledge deficit regarding management of diabetes as evidenced by elevated A1C  (Pended)   Role Documenting the Problem One  Care Management Coordinator  (Pended)   Care Plan for Problem One  Active  (Pended)   THN Long Term Goal   Member's A1C will be decreased to </= 8.5 within the next 3 months  (Pended)   THN Long Term Goal Start Date  11/02/17  (Pended)   THN CM Short Term Goal #1   Member will keep and attend appointments with primary & endocrinologist within the next 2 weeks  (Pended)   THN CM Short Term Goal #1 Start Date  11/02/17  (Pended)   THN CM Short Term Goal #1 Met Date  12/02/17  (Pended)   THN CM Short Term Goal #2   Member will check and record blood sugar readings daily over the next 4 weeks  (Pended)   THN CM Short Term Goal #2 Start Date  11/02/17  (Pended)   THN CM Short Term Goal #2 Met Date  11/13/17  (Pended)   THN CM Short Term Goal #3  client/family will reviewed diabetes booklet within the next 30 days.  (Pended)   THN CM Short Term Goal #3 Start Date  11/13/17  (Pended)   THN CM Short Term Goal #3 Met Date  12/01/17  (Pended)   THN CM Short Term Goal #4  client will verbalize signs/symptoms of hypoglycemia within the next 30 days.  (Pended)   Interventions for Short Term Goal #4  RNCM discussed with client/wife signs and symptoms of hypoglycemia. used clien'ts recorded  readings to reviewe low blood sugar his signs/symptoms associated with the reading and how he treated the low blood sugar.  (Pended)      Thea Silversmith, RN, MSN, [place] Coordinator Cell: [phone]

## 2018-01-06 DIAGNOSIS — Z23 Encounter for immunization: Secondary | ICD-10-CM | POA: Diagnosis not present

## 2018-01-07 DIAGNOSIS — C61 Malignant neoplasm of prostate: Secondary | ICD-10-CM | POA: Diagnosis not present

## 2018-01-12 DIAGNOSIS — C61 Malignant neoplasm of prostate: Secondary | ICD-10-CM | POA: Diagnosis not present

## 2018-01-12 DIAGNOSIS — C672 Malignant neoplasm of lateral wall of bladder: Secondary | ICD-10-CM | POA: Diagnosis not present

## 2018-01-19 DIAGNOSIS — H401112 Primary open-angle glaucoma, right eye, moderate stage: Secondary | ICD-10-CM | POA: Diagnosis not present

## 2018-01-19 DIAGNOSIS — H401123 Primary open-angle glaucoma, left eye, severe stage: Secondary | ICD-10-CM | POA: Diagnosis not present

## 2018-01-19 DIAGNOSIS — Z961 Presence of intraocular lens: Secondary | ICD-10-CM | POA: Diagnosis not present

## 2018-02-03 DIAGNOSIS — H401111 Primary open-angle glaucoma, right eye, mild stage: Secondary | ICD-10-CM | POA: Diagnosis not present

## 2018-02-03 DIAGNOSIS — Z961 Presence of intraocular lens: Secondary | ICD-10-CM | POA: Diagnosis not present

## 2018-02-08 ENCOUNTER — Other Ambulatory Visit: Payer: Self-pay | Admitting: *Deleted

## 2018-02-08 NOTE — Patient Outreach (Signed)
[facility]) Care Management  02/08/2018  [name] February 19, 1941 [id number]   RN Health Coach attempted #1 follow up outreach call to patient.  Patient was unavailable. HIPPA compliance voicemail message left with return callback number.  Plan: RN will call patient again within 10 business days.  [place] Care Management [phone]

## 2018-02-09 DIAGNOSIS — E1165 Type 2 diabetes mellitus with hyperglycemia: Secondary | ICD-10-CM | POA: Diagnosis not present

## 2018-02-09 DIAGNOSIS — E7800 Pure hypercholesterolemia, unspecified: Secondary | ICD-10-CM | POA: Diagnosis not present

## 2018-02-16 DIAGNOSIS — E7800 Pure hypercholesterolemia, unspecified: Secondary | ICD-10-CM | POA: Diagnosis not present

## 2018-02-16 DIAGNOSIS — E1165 Type 2 diabetes mellitus with hyperglycemia: Secondary | ICD-10-CM | POA: Diagnosis not present

## 2018-02-16 DIAGNOSIS — I10 Essential (primary) hypertension: Secondary | ICD-10-CM | POA: Diagnosis not present

## 2018-02-17 ENCOUNTER — Other Ambulatory Visit: Payer: Self-pay | Admitting: *Deleted

## 2018-02-17 NOTE — Patient Outreach (Signed)
[facility]) Care Management  02/17/2018  Barclay Lennox [birth year] [id number]   RN Health Coach telephone call to patient.  Hipaa compliance verified. Per patient this was not a good time to speak with RN Health Coach. Per patient he was getting ready to go out.   Plan: RN will call patient again within 10 business  Days.  [place] Care Management [phone]

## 2018-03-04 ENCOUNTER — Ambulatory Visit: Payer: Self-pay | Admitting: *Deleted

## 2018-05-07 DIAGNOSIS — Z961 Presence of intraocular lens: Secondary | ICD-10-CM | POA: Diagnosis not present

## 2018-05-07 DIAGNOSIS — H401111 Primary open-angle glaucoma, right eye, mild stage: Secondary | ICD-10-CM | POA: Diagnosis not present

## 2018-06-11 DIAGNOSIS — Z23 Encounter for immunization: Secondary | ICD-10-CM | POA: Diagnosis not present

## 2018-06-11 DIAGNOSIS — E785 Hyperlipidemia, unspecified: Secondary | ICD-10-CM | POA: Diagnosis not present

## 2018-06-11 DIAGNOSIS — Z0000 Encounter for general adult medical examination without abnormal findings: Secondary | ICD-10-CM | POA: Diagnosis not present

## 2018-06-11 DIAGNOSIS — Z8546 Personal history of malignant neoplasm of prostate: Secondary | ICD-10-CM | POA: Diagnosis not present

## 2018-06-11 DIAGNOSIS — F320 Major depressive disorder, single episode, mild: Secondary | ICD-10-CM | POA: Diagnosis not present

## 2018-06-11 DIAGNOSIS — D508 Other iron deficiency anemias: Secondary | ICD-10-CM | POA: Diagnosis not present

## 2018-06-11 DIAGNOSIS — E1169 Type 2 diabetes mellitus with other specified complication: Secondary | ICD-10-CM | POA: Diagnosis not present

## 2018-06-11 DIAGNOSIS — I10 Essential (primary) hypertension: Secondary | ICD-10-CM | POA: Diagnosis not present

## 2018-06-11 DIAGNOSIS — M109 Gout, unspecified: Secondary | ICD-10-CM | POA: Diagnosis not present

## 2018-06-15 ENCOUNTER — Other Ambulatory Visit: Payer: Self-pay | Admitting: *Deleted

## 2018-06-15 DIAGNOSIS — F320 Major depressive disorder, single episode, mild: Secondary | ICD-10-CM | POA: Diagnosis not present

## 2018-06-15 DIAGNOSIS — E785 Hyperlipidemia, unspecified: Secondary | ICD-10-CM | POA: Diagnosis not present

## 2018-06-15 DIAGNOSIS — E1169 Type 2 diabetes mellitus with other specified complication: Secondary | ICD-10-CM | POA: Diagnosis not present

## 2018-06-15 DIAGNOSIS — D508 Other iron deficiency anemias: Secondary | ICD-10-CM | POA: Diagnosis not present

## 2018-06-15 DIAGNOSIS — Z8546 Personal history of malignant neoplasm of prostate: Secondary | ICD-10-CM | POA: Diagnosis not present

## 2018-06-15 DIAGNOSIS — E139 Other specified diabetes mellitus without complications: Secondary | ICD-10-CM | POA: Diagnosis not present

## 2018-06-15 DIAGNOSIS — E1159 Type 2 diabetes mellitus with other circulatory complications: Secondary | ICD-10-CM | POA: Diagnosis not present

## 2018-06-15 DIAGNOSIS — Z7984 Long term (current) use of oral hypoglycemic drugs: Secondary | ICD-10-CM | POA: Diagnosis not present

## 2018-06-15 DIAGNOSIS — I10 Essential (primary) hypertension: Secondary | ICD-10-CM | POA: Diagnosis not present

## 2018-06-15 NOTE — Patient Outreach (Addendum)
[place] [facility]) Care Management  06/15/2018   [name] [birth year] [id number]  RN Health Coach telephone call to patient.  Hipaa compliance verified. RN spoke with patient for short moment. Patient stated speak to my wife. Per wife the patient does not like to talk on the phone. RN explained that she had been trying to reach them and they had not returned or answered the call until now. Wife agrees to be the contact person since husband has some dementia. Patient last A1C is 7.0 and his fasting blood sugar is 145 today. Patient does not use any devices ambulating. Patient is having eye surgery on Friday due to build up of pressure in eye. Wife is POA and has agreed to follow up outreach calls and education.   Current Medications:  Current Outpatient Medications  Medication Sig Dispense Refill  . ACCU-CHEK AVIVA PLUS test strip USE AS INSTRUCTED TO CHECK BLOOD SUGAR THREE TIMES DAILY 300 each 1  . acetaminophen (TYLENOL) 325 MG tablet Take 2 tablets (650 mg total) by mouth every 6 (six) hours as needed for mild pain, fever or headache. 20 tablet 1  . amLODipine (NORVASC) 10 MG tablet Take 1 tablet (10 mg total) by mouth daily. 30 tablet 1  . aspirin 81 MG tablet Take 81 mg by mouth at bedtime.     . Blood Glucose Monitoring Suppl (ACCU-CHEK AVIVA PLUS) w/Device KIT Use to check blood sugar 3 times per day dx code E11.65 1 kit 0  . donepezil (ARICEPT) 10 MG tablet Take 1 tablet (10 mg total) by mouth at bedtime. 30 tablet 1  . dorzolamide-timolol (COSOPT) 22.3-6.8 MG/ML ophthalmic solution Place 1 drop into both eyes 2 (two) times daily.     Marland Kitchen escitalopram (LEXAPRO) 5 MG tablet Take 1 tablet (5 mg total) by mouth at bedtime. 30 tablet 1  . ferrous sulfate 325 (65 FE) MG tablet Take 1 tablet (325 mg total) by mouth 2 (two) times daily. 60 tablet 3  . fish oil-omega-3 fatty acids 1000 MG capsule Take 1 g by mouth daily.     . hydroxypropyl methylcellulose / hypromellose (ISOPTO  TEARS / GONIOVISC) 2.5 % ophthalmic solution Place 1 drop into both eyes 4 (four) times daily as needed for dry eyes.     . indomethacin (INDOCIN) 50 MG capsule Take 1 capsule (50 mg total) by mouth 2 (two) times daily as needed (gout). Take with food 20 capsule 1  . Insulin Detemir (LEVEMIR FLEXTOUCH) 100 UNIT/ML Pen Inject 26 units daily (Patient not taking: Reported on 01/05/2018) 20 pen 1  . insulin regular (NOVOLIN R RELION) 100 units/mL injection Inject 0.05-0.15 mLs (5-15 Units total) into the skin 3 (three) times daily with meals as needed for high blood sugar (per sliding scale). 10 mL 2  . Insulin Syringe-Needle U-100 (INSULIN SYRINGE .5CC/31GX5/16") 31G X 5/16" 0.5 ML MISC Use one to inject insulin daily 30 each 1  . latanoprost (XALATAN) 0.005 % ophthalmic solution Place 1 drop into both eyes at bedtime.     . liraglutide (VICTOZA) 18 MG/3ML SOPN Inject 0.2 mLs (1.2 mg total) into the skin daily. (Patient not taking: Reported on 01/05/2018) 15 pen 1  . meclizine (ANTIVERT) 25 MG tablet Take 1 tablet (25 mg total) by mouth 3 (three) times daily as needed for dizziness. 30 tablet 0  . metFORMIN (GLUCOPHAGE) 1000 MG tablet Take 1 tablet (1,000 mg total) by mouth 2 (two) times daily with a meal. 60 tablet 2  .  metoprolol tartrate (LOPRESSOR) 25 MG tablet Take 1 tablet (25 mg total) by mouth 2 (two) times daily. 60 tablet 2  . PREDNISOLONE ACETATE OP Place 1 drop into the right eye daily.    . simvastatin (ZOCOR) 20 MG tablet Take 1 tablet (20 mg total) by mouth daily at 6 PM. 30 tablet 1  . tamsulosin (FLOMAX) 0.4 MG CAPS capsule Take 1 capsule (0.4 mg total) by mouth at bedtime. 30 capsule 1   No current facility-administered medications for this visit.     Functional Status:  In your present state of health, do you have any difficulty performing the following activities: 06/15/2018 11/13/2017  Hearing? [name]  Vision? Y N  Comment patient is having surgery onlt eye [id number] -  Difficulty  concentrating or making decisions? N N  Walking or climbing stairs? N Y  Dressing or bathing? N N  Doing errands, shopping? N Y  Comment wife does most of shopping -  Conservation officer, nature and eating ? Y N  Comment wife prepares -  Using the Toilet? N N  In the past six months, have you accidently leaked urine? Y Y  Comment some leakage urgency  Do you have problems with loss of bowel control? N N  Managing your Medications? N Y  Managing your Finances? N N  Housekeeping or managing your Housekeeping? Y N  Comment wife handles -  Some recent data might be hidden    Fall/Depression Screening: Fall Risk  06/15/2018 11/13/2017  Falls in the past year? 0 No   PHQ 2/9 Scores 06/15/2018 11/13/2017  PHQ - 2 Score 0 0   THN CM Care Plan Problem One     Most Recent Value  Care Plan Problem One  Knowledge Deficit in Self Management of Diabetes  Role Documenting the Problem One  [place] for Problem One  Active  THN Long Term Goal   Patient A1C will remain 7.0 ot less with next blood draw  THN Long Term Goal Start Date  06/15/18  Interventions for Problem One Long Term Goal  RN discussed what the fasting blood sugars should be 80-130 to see an A1C less thatn 7.0. RNJ discussed medication adherence. RN will follow up for further discussion and teach back  THN CM Short Term Goal #1   Patient will be able to verbalize eating healthier snacks witin the next 30 days  THN CM Short Term Goal #1 Start Date  06/15/18  Interventions for Short Term Goal #1  Rn discussed eating healthy snacks. RN sent a list of low carb and healthy snacks. RN will follow up with further discussion and teach back  THN CM Short Term Goal #2   Patient will be able to verbalize signs and symptoms of hypo and hyperglycemia and action plan  [facility] CM Short Term Goal #2 Start Date  06/15/18  Interventions for Short Term Goal #2  RN discussed the patient symptoms of hypo and hyper glycemia. RN sent a picture chart of faces  describing hypo and hyperglycemia with action plan. RN will follow up with further discussion and teach back      Assessment:  A1C 7.0 Fasting blood sugar 145 Patient will benefit from [place] telephonic outreach for education and support for diabetes self management.  Plan:  RN sent Living well with diabetes book RN discussed A1C and fasting blood sugar range RN sent facial sheet on hypo and hyperglycemia with action plan RN sent 2020 Calendar book  for documentation RN discussed medication adherence RN discussed getting back in to a routine exercise program RN sent barriers letter and assessment   [facility] Management [phone] to PCP RN will follow up within the month of May

## 2018-06-17 DIAGNOSIS — H401123 Primary open-angle glaucoma, left eye, severe stage: Secondary | ICD-10-CM | POA: Diagnosis not present

## 2018-07-27 DIAGNOSIS — E785 Hyperlipidemia, unspecified: Secondary | ICD-10-CM | POA: Diagnosis not present

## 2018-07-27 DIAGNOSIS — Z79899 Other long term (current) drug therapy: Secondary | ICD-10-CM | POA: Diagnosis not present

## 2018-09-14 ENCOUNTER — Ambulatory Visit: Payer: Self-pay | Admitting: *Deleted

## 2018-10-05 DIAGNOSIS — Z961 Presence of intraocular lens: Secondary | ICD-10-CM | POA: Diagnosis not present

## 2018-10-05 DIAGNOSIS — H401123 Primary open-angle glaucoma, left eye, severe stage: Secondary | ICD-10-CM | POA: Diagnosis not present

## 2018-10-05 DIAGNOSIS — H401112 Primary open-angle glaucoma, right eye, moderate stage: Secondary | ICD-10-CM | POA: Diagnosis not present

## 2018-12-16 DIAGNOSIS — M109 Gout, unspecified: Secondary | ICD-10-CM | POA: Diagnosis not present

## 2018-12-16 DIAGNOSIS — E139 Other specified diabetes mellitus without complications: Secondary | ICD-10-CM | POA: Diagnosis not present

## 2018-12-16 DIAGNOSIS — E785 Hyperlipidemia, unspecified: Secondary | ICD-10-CM | POA: Diagnosis not present

## 2018-12-16 DIAGNOSIS — R413 Other amnesia: Secondary | ICD-10-CM | POA: Diagnosis not present

## 2018-12-16 DIAGNOSIS — I10 Essential (primary) hypertension: Secondary | ICD-10-CM | POA: Diagnosis not present

## 2018-12-16 DIAGNOSIS — Z8546 Personal history of malignant neoplasm of prostate: Secondary | ICD-10-CM | POA: Diagnosis not present

## 2018-12-16 DIAGNOSIS — E1159 Type 2 diabetes mellitus with other circulatory complications: Secondary | ICD-10-CM | POA: Diagnosis not present

## 2018-12-16 DIAGNOSIS — E1169 Type 2 diabetes mellitus with other specified complication: Secondary | ICD-10-CM | POA: Diagnosis not present

## 2018-12-16 DIAGNOSIS — F320 Major depressive disorder, single episode, mild: Secondary | ICD-10-CM | POA: Diagnosis not present

## 2018-12-16 DIAGNOSIS — D508 Other iron deficiency anemias: Secondary | ICD-10-CM | POA: Diagnosis not present

## 2019-01-04 ENCOUNTER — Ambulatory Visit (INDEPENDENT_AMBULATORY_CARE_PROVIDER_SITE_OTHER): Payer: Medicare HMO | Admitting: Cardiology

## 2019-01-04 ENCOUNTER — Encounter (INDEPENDENT_AMBULATORY_CARE_PROVIDER_SITE_OTHER): Payer: Self-pay

## 2019-01-04 ENCOUNTER — Encounter: Payer: Self-pay | Admitting: Cardiology

## 2019-01-04 ENCOUNTER — Other Ambulatory Visit: Payer: Self-pay

## 2019-01-04 VITALS — BP 124/70 | HR 83 | Ht 72.0 in | Wt 183.0 lb

## 2019-01-04 DIAGNOSIS — E119 Type 2 diabetes mellitus without complications: Secondary | ICD-10-CM | POA: Diagnosis not present

## 2019-01-04 DIAGNOSIS — T783XXS Angioneurotic edema, sequela: Secondary | ICD-10-CM

## 2019-01-04 DIAGNOSIS — T783XXD Angioneurotic edema, subsequent encounter: Secondary | ICD-10-CM | POA: Diagnosis not present

## 2019-01-04 DIAGNOSIS — I10 Essential (primary) hypertension: Secondary | ICD-10-CM | POA: Diagnosis not present

## 2019-01-04 DIAGNOSIS — I2510 Atherosclerotic heart disease of native coronary artery without angina pectoris: Secondary | ICD-10-CM | POA: Diagnosis not present

## 2019-01-04 DIAGNOSIS — E7800 Pure hypercholesterolemia, unspecified: Secondary | ICD-10-CM

## 2019-01-04 DIAGNOSIS — I129 Hypertensive chronic kidney disease with stage 1 through stage 4 chronic kidney disease, or unspecified chronic kidney disease: Secondary | ICD-10-CM | POA: Diagnosis not present

## 2019-01-04 DIAGNOSIS — N182 Chronic kidney disease, stage 2 (mild): Secondary | ICD-10-CM | POA: Diagnosis not present

## 2019-01-04 DIAGNOSIS — E785 Hyperlipidemia, unspecified: Secondary | ICD-10-CM

## 2019-01-04 NOTE — Progress Notes (Signed)
[facility], [address], [place]  [address] Phone: [phone] Fax:  [phone]  Date:  01/04/2019   ID:  [name], DOB 06-13-1940, MRN [id number]  PCP:  [name], MD   History of Present Illness: [name] is a 78 y.o. male here for follow-up, prior patient of Dr. Melvern Banker. He also has comorbidities of diabetes, hypertension, chronic kidney disease stage II, iron deficiency anemia.  Previously, old myocardial infarction-prior CAD listed on medical records. He believes that he had a stress test previously several years ago. He does not recall having a cardiac catheterization, stent placement. Echocardiogram in 2015 thankfully did not show any signs of infarction, normal EF.  He does recall an incident that he had once when he was sitting at a table and became "blank". Did not fall over or lose consciousness. He has not had any episodes since.  During his prior visit with me, ACE inhibitor was stopped because of angioedema.  12/15/2017- overall doing well, no fevers chills nausea vomiting syncope bleeding orthopnea.  He was in the hospital in July 2019 with fever.  Thankfully he is doing much better now.  No cardiac complaints.  Blood pressure under excellent control.  Continues with low-dose aspirin given his high risk for underlying CAD with his diabetes.  Hemoglobin A1c was 10.  Working with Dr. Criss Rosales.  01/04/2019- here for the follow-up of angioedema.  Remember, at last visit I took CAD off of his medical records.  Did not recall having cath or stent placement.  Echo was normal. No CP, No SOB, no syncope, no bleeding. Taking meds.  Doing well.  Wt Readings from Last 3 Encounters:  01/04/19 183 lb (83 kg)  12/15/17 174 lb 9.6 oz (79.2 kg)  11/13/17 174 lb (78.9 kg)     Past Medical History:  Diagnosis Date  . Diabetes mellitus    insulin dependent  . Heart disease, unspecified   . Hypercholesterolemia   . Hypertension   . Sarcoidosis   .  Unspecified glaucoma(365.9)     Past Surgical History:  Procedure Laterality Date  . CATARACT EXTRACTION    . knee replacement surgery     right    Current Outpatient Medications  Medication Sig Dispense Refill  . ACCU-CHEK AVIVA PLUS test strip USE AS INSTRUCTED TO CHECK BLOOD SUGAR THREE TIMES DAILY 300 each 1  . acetaminophen (TYLENOL) 325 MG tablet Take 2 tablets (650 mg total) by mouth every 6 (six) hours as needed for mild pain, fever or headache. 20 tablet 1  . amLODipine (NORVASC) 10 MG tablet Take 1 tablet (10 mg total) by mouth daily. 30 tablet 1  . aspirin 81 MG tablet Take 81 mg by mouth at bedtime.     Marland Kitchen atorvastatin (LIPITOR) 20 MG tablet Take 20 mg by mouth daily.    . Blood Glucose Monitoring Suppl (ACCU-CHEK AVIVA PLUS) w/Device KIT Use to check blood sugar 3 times per day dx code E11.65 1 kit 0  . donepezil (ARICEPT) 10 MG tablet Take 1 tablet (10 mg total) by mouth at bedtime. 30 tablet 1  . dorzolamide-timolol (COSOPT) 22.3-6.8 MG/ML ophthalmic solution Place 1 drop into both eyes 2 (two) times daily.     Marland Kitchen escitalopram (LEXAPRO) 5 MG tablet Take 1 tablet (5 mg total) by mouth at bedtime. 30 tablet 1  . ferrous sulfate 325 (65 FE) MG tablet Take 1 tablet (325 mg total) by mouth 2 (two) times daily. [place]  tablet 3  . fish oil-omega-3 fatty acids 1000 MG capsule Take 1 g by mouth daily.     . hydroxypropyl methylcellulose / hypromellose (ISOPTO TEARS / GONIOVISC) 2.5 % ophthalmic solution Place 1 drop into both eyes 4 (four) times daily as needed for dry eyes.     . indomethacin (INDOCIN) 50 MG capsule Take 1 capsule (50 mg total) by mouth 2 (two) times daily as needed (gout). Take with food 20 capsule 1  . Insulin Detemir (LEVEMIR FLEXTOUCH) 100 UNIT/ML Pen Inject 26 units daily 20 pen 1  . insulin regular (NOVOLIN R RELION) 100 units/mL injection Inject 0.05-0.15 mLs (5-15 Units total) into the skin 3 (three) times daily with meals as needed for high blood sugar (per  sliding scale). 10 mL 2  . Insulin Syringe-Needle U-100 (INSULIN SYRINGE .5CC/31GX5/16") 31G X 5/16" 0.5 ML MISC Use one to inject insulin daily 30 each 1  . latanoprost (XALATAN) 0.005 % ophthalmic solution Place 1 drop into both eyes at bedtime.     . liraglutide (VICTOZA) 18 MG/3ML SOPN Inject 0.2 mLs (1.2 mg total) into the skin daily. 15 pen 1  . meclizine (ANTIVERT) 25 MG tablet Take 1 tablet (25 mg total) by mouth 3 (three) times daily as needed for dizziness. 30 tablet 0  . metFORMIN (GLUCOPHAGE) 1000 MG tablet Take 1 tablet (1,000 mg total) by mouth 2 (two) times daily with a meal. 60 tablet 2  . metoprolol tartrate (LOPRESSOR) 25 MG tablet Take 1 tablet (25 mg total) by mouth 2 (two) times daily. 60 tablet 2  . PREDNISOLONE ACETATE OP Place 1 drop into the right eye daily.    . simvastatin (ZOCOR) 20 MG tablet Take 1 tablet (20 mg total) by mouth daily at 6 PM. 30 tablet 1  . tamsulosin (FLOMAX) 0.4 MG CAPS capsule Take 1 capsule (0.4 mg total) by mouth at bedtime. 30 capsule 1   No current facility-administered medications for this visit.     Allergies:    Allergies  Allergen Reactions  . Ace Inhibitors Other (See Comments)    angioedema  . Allopurinol Rash    Social History:  The patient  reports that he quit smoking about 13 years ago. His smoking use included cigarettes. He smoked 0.50 packs per day. He has never used smokeless tobacco. He reports that he does not drink alcohol or use drugs.   Family History  Problem Relation Age of Onset  . Hypertension Mother   . Diabetes Mellitus I Mother   . Hypertension Father   . Coronary artery disease Father   . Hypertension Sister   . Hypertension Brother   . Coronary artery disease Brother   . Diabetes Mellitus I Brother   . Diabetes Mellitus I Brother   . Cancer Neg Hx    brother died with coronary artery disease, lung cancer. He has 7 total siblings.  ROS:  Please see the history of present illness.    PHYSICAL EXAM:  VS:  BP 124/70   Pulse 83   Ht 6' (1.829 m)   Wt 183 lb (83 kg)   BMI 24.82 kg/m  GEN: Well nourished, well developed, in no acute distress  HEENT: normal  Neck: no JVD, carotid bruits, or masses Cardiac: RRR; no murmurs, rubs, or gallops,no edema  Respiratory:  clear to auscultation bilaterally, normal work of breathing GI: soft, nontender, nondistended, + BS MS: no deformity or atrophy  Skin: warm and dry, no rash Neuro:  Alert and  Oriented x 3, Strength and sensation are intact Psych: euthymic mood, full affect    EKG:  EKG was ordered today 01/04/2019-sinus rhythm 83 with PACs personally reviewed 07/11/15-sinus rhythm, 85, nonspecific ST-T wave change personally viewed-no specific change from prior Sinus rhythm, 90, nonspecific ST-T wave changes, LVH borderline  Labs: Glucose 113, creatinine 1.08, potassium 3.9, sodium 140, hemoglobin 14.2, platelets 174, uric acid 9.1 mildly elevated, hemoglobin A1c 6.9, LDL 83   ECHO: 02/06/14: - Left ventricle: The cavity size was normal. There was mild focal basal hypertrophy of the septum. Systolic function was normal. The estimated ejection fraction was in the range of 60% to 65%. Wall motion was normal; there were no regional wall motion abnormalities. Doppler parameters are consistent with abnormal left ventricular relaxation (grade 1 diastolic dysfunction). - Mitral valve: There was systolic anterior motion of the chordal structures.  ASSESSMENT AND PLAN:  1. Angioedema-previously noted during a clinic encounter. He should not be prescribed an ACE inhibitor in the future nor should he be prescribed angiotensin receptor blocker.  No further recurrences.  Doing well.  No changes. 2. I have taken coronary artery disease off of his past medical history-has a history of this is been carried on his past medical history. He he believes that he is had a stress test in the past but does not recall having a cardiac catheterization.  Echocardiogram was reassuring. I looked through old [place] records and do not see a Dr. Melvern Banker visit.  Overall stable.  No anginal symptoms.  Still no symptoms. 3. Hypertension-currently well controlled.  Medications reviewed.  Doing well 4. Hyperlipidemia-continue with statin therapy.  Aggressive prevention efforts.  Continue to support. 5. Diabetes with chronic kidney disease stage II- Well controlled. Dr. Chalmers Cater.  No changes made 6. He would like to be seen next year.Bobby Rumpf, MD [facility]  01/04/2019 8:42 AM

## 2019-01-04 NOTE — Patient Instructions (Signed)
Medication Instructions:  The current medical regimen is effective;  continue present plan and medications.  If you need a refill on your cardiac medications before your next appointment, please call your pharmacy.   Follow-Up: At [facility], you and your health needs are our priority.  As part of our continuing mission to provide you with exceptional heart care, we have created designated Provider Care Teams.  These Care Teams include your primary Cardiologist (physician) and Advanced Practice Providers (APPs -  Physician Assistants and Nurse Practitioners) who all work together to provide you with the care you need, when you need it. You will need a follow up appointment in 12 months.  Please call our office 2 months in advance to schedule this appointment.  You may see Mark Skains, MD or one of the following Advanced Practice Providers on your designated Care Team:   Lori Gerhardt, NP Laura Ingold, NP . Jill McDaniel, NP  Thank you for choosing  HeartCare!!      

## 2019-01-07 DIAGNOSIS — C61 Malignant neoplasm of prostate: Secondary | ICD-10-CM | POA: Diagnosis not present

## 2019-01-14 DIAGNOSIS — C678 Malignant neoplasm of overlapping sites of bladder: Secondary | ICD-10-CM | POA: Diagnosis not present

## 2019-01-14 DIAGNOSIS — C61 Malignant neoplasm of prostate: Secondary | ICD-10-CM | POA: Diagnosis not present

## 2019-01-17 ENCOUNTER — Other Ambulatory Visit: Payer: Self-pay | Admitting: *Deleted

## 2019-01-17 DIAGNOSIS — I10 Essential (primary) hypertension: Secondary | ICD-10-CM | POA: Diagnosis not present

## 2019-01-17 DIAGNOSIS — E1165 Type 2 diabetes mellitus with hyperglycemia: Secondary | ICD-10-CM | POA: Diagnosis not present

## 2019-01-17 DIAGNOSIS — E7800 Pure hypercholesterolemia, unspecified: Secondary | ICD-10-CM | POA: Diagnosis not present

## 2019-01-17 NOTE — Patient Outreach (Signed)
[name]) Care Management  01/17/2019   [name] [birth year] [id number]  RN Health Coach telephone call to patient.  [employer] compliance verified. Patient is very hard of hearing and passed phone to wife. Patient and wife did not know what the patient A1C was only that it was less that 10.0. RN will contact Alan to find out. Patient fast blood sugar is 155. Per wife he is taking his medications as per ordered. Patient is eating better. Wife states she does not cook potatoes and that he eats very little bread. Per wife she does not fry much food anymore. Patient has not been able to go to the silver sneakers due to the [place]. Per wife he is able to afford his medications. Per wife they have adequate food. Patient is checking blood sugars three times a day. Wife has agreed to follow up outreach calls.   Current Medications:  Current Outpatient Medications  Medication Sig Dispense Refill  . ACCU-CHEK AVIVA PLUS test strip USE AS INSTRUCTED TO CHECK BLOOD SUGAR THREE TIMES DAILY 300 each 1  . acetaminophen (TYLENOL) 325 MG tablet Take 2 tablets (650 mg total) by mouth every 6 (six) hours as needed for mild pain, fever or headache. 20 tablet 1  . amLODipine (NORVASC) 10 MG tablet Take 1 tablet (10 mg total) by mouth daily. 30 tablet 1  . aspirin 81 MG tablet Take 81 mg by mouth at bedtime.     [name] Kitchen atorvastatin (LIPITOR) 20 MG tablet Take 20 mg by mouth daily.    . Blood Glucose Monitoring Suppl (ACCU-CHEK AVIVA PLUS) w/Device KIT Use to check blood sugar 3 times per day dx code E11.65 1 kit 0  . donepezil (ARICEPT) 10 MG tablet Take 1 tablet (10 mg total) by mouth at bedtime. 30 tablet 1  . dorzolamide-timolol (COSOPT) 22.3-6.8 MG/ML ophthalmic solution Place 1 drop into both eyes 2 (two) times daily.     [name] Kitchen escitalopram (LEXAPRO) 5 MG tablet Take 1 tablet (5 mg total) by mouth at bedtime. 30 tablet 1  . ferrous sulfate 325 (65 FE) MG tablet Take 1 tablet (325 mg total) by mouth 2  (two) times daily. 60 tablet 3  . fish oil-omega-3 fatty acids 1000 MG capsule Take 1 g by mouth daily.     . hydroxypropyl methylcellulose / hypromellose (ISOPTO TEARS / GONIOVISC) 2.5 % ophthalmic solution Place 1 drop into both eyes 4 (four) times daily as needed for dry eyes.     . indomethacin (INDOCIN) 50 MG capsule Take 1 capsule (50 mg total) by mouth 2 (two) times daily as needed (gout). Take with food 20 capsule 1  . Insulin Detemir (LEVEMIR FLEXTOUCH) 100 UNIT/ML Pen Inject 26 units daily 20 pen 1  . insulin regular (NOVOLIN R RELION) 100 units/mL injection Inject 0.05-0.15 mLs (5-15 Units total) into the skin 3 (three) times daily with meals as needed for high blood sugar (per sliding scale). 10 mL 2  . Insulin Syringe-Needle U-100 (INSULIN SYRINGE .5CC/31GX5/16") 31G X 5/16" 0.5 ML MISC Use one to inject insulin daily 30 each 1  . latanoprost (XALATAN) 0.005 % ophthalmic solution Place 1 drop into both eyes at bedtime.     . liraglutide (VICTOZA) 18 MG/3ML SOPN Inject 0.2 mLs (1.2 mg total) into the skin daily. 15 pen 1  . meclizine (ANTIVERT) 25 MG tablet Take 1 tablet (25 mg total) by mouth 3 (three) times daily as needed for dizziness. 30 tablet 0  . metFORMIN (GLUCOPHAGE)  1000 MG tablet Take 1 tablet (1,000 mg total) by mouth 2 (two) times daily with a meal. 60 tablet 2  . metoprolol tartrate (LOPRESSOR) 25 MG tablet Take 1 tablet (25 mg total) by mouth 2 (two) times daily. 60 tablet 2  . PREDNISOLONE ACETATE OP Place 1 drop into the right eye daily.    . simvastatin (ZOCOR) 20 MG tablet Take 1 tablet (20 mg total) by mouth daily at 6 PM. 30 tablet 1  . tamsulosin (FLOMAX) 0.4 MG CAPS capsule Take 1 capsule (0.4 mg total) by mouth at bedtime. 30 capsule 1   No current facility-administered medications for this visit.     Functional Status:  In your present state of health, do you have any difficulty performing the following activities: 01/17/2019 06/15/2018  Hearing? Y Y  Comment  Patient has diffuculty hearing and doesn't like to talk on phone -  Vision? [name]  Comment patient had surgery for glaucoma patient is having surgery onlt eye [id number]  Difficulty concentrating or making decisions? N N  Walking or climbing stairs? N N  Dressing or bathing? - N  Doing errands, shopping? Y N  Comment per wife she does the shopping wife does most of shopping  Preparing Food and eating ? N Y  Comment wife prepare food. wife prepares  Using the Toilet? N N  In the past six months, have you accidently leaked urine? - Y  Comment - some leakage  Do you have problems with loss of bowel control? N N  Managing your Medications? N N  Managing your Finances? N N  Housekeeping or managing your Housekeeping? [name]  Comment wife takes care of the home wife handles  Some recent data might be hidden    Fall/Depression Screening: Fall Risk  01/17/2019 06/15/2018 11/13/2017  Falls in the past year? 0 0 No  Number falls in past yr: 0 - -  Injury with Fall? 0 - -  Follow up Falls evaluation completed - -   PHQ 2/9 Scores 01/17/2019 06/15/2018 11/13/2017  PHQ - 2 Score 0 0 0   THN CM Care Plan Problem One     Most Recent Value  Care Plan Problem One  Knowledge Deficit in Self Management of Diabetes  Role Documenting the Problem One  [name] for Problem One  Active  THN Long Term Goal   Patient will see a decrease in A!C from 8.5 done 01/17/2019 within the next 90 days  THN Long Term Goal Start Date  01/17/19  Interventions for Problem One Long Term Goal  RN discussed with wife and patient what the A1c. Neither new only that it was less than 10.0. RN called [name] who seen patient toaday and it was 8.5 today. RN sent information on why you get A1C checked.  THN CM Short Term Goal #1   Patient will follow up Health Maintenance within  the next 30 days  THN CM Short Term Goal #1 Start Date  01/17/19  Interventions for Short Term Goal #1  RN discussed follow up with health  maintenance. Patient is scheduling flu shot. Patient will follow up with eye Alan. Shelly Bombard will follow up for compliance  THN CM Short Term Goal #2   Patient will be able to verbalize signs and symptoms of hypo and hyperglycemia and action plan  [facility] Craig CM Short Term Goal #2 Met Date  01/17/19  [facility]: [facility] Rhonda W CM Short Term Goal #3  Patient and wife will  verbalize the importance of foot care within the next 30 days  THN CM Short Term Goal #3 Start Date  01/17/19  Interventions for Short Tern Goal #3  Patient does not have a podiatrist. RN sent educational material on the importance of foot care. RN will follow up with further discussion      Assessment:  A1C is 8.5 done on 01/17/2019 per [name] office FBS 155 Patient is checking blood sugars 3 x day Patient had glaucoma surgery done 09/2018 Patient will continue to benefit from [place] telephonic outreach for education and support for diabetes self management. Plan:  RN discussed Health Maintenance Patient will continue to take medications as per ordered Patient will continue to check blood sugars three times a day and document Patient will get flu shot in the month of October Patient is trying to eat healthier RN sent educational material on why get A1C checked RN sent educational material on foot care RN sent educational material on sick days since flu season is coming up RN will follow up outreach within the month of December  Alicia Seib [place] Management [phone]

## 2019-01-20 DIAGNOSIS — Z23 Encounter for immunization: Secondary | ICD-10-CM | POA: Diagnosis not present

## 2019-01-26 ENCOUNTER — Ambulatory Visit: Payer: Medicare HMO | Admitting: Cardiology

## 2019-02-02 DIAGNOSIS — H401112 Primary open-angle glaucoma, right eye, moderate stage: Secondary | ICD-10-CM | POA: Diagnosis not present

## 2019-02-02 DIAGNOSIS — Z961 Presence of intraocular lens: Secondary | ICD-10-CM | POA: Diagnosis not present

## 2019-02-02 DIAGNOSIS — H401123 Primary open-angle glaucoma, left eye, severe stage: Secondary | ICD-10-CM | POA: Diagnosis not present

## 2019-03-01 DIAGNOSIS — R5383 Other fatigue: Secondary | ICD-10-CM | POA: Diagnosis not present

## 2019-03-01 DIAGNOSIS — F330 Major depressive disorder, recurrent, mild: Secondary | ICD-10-CM | POA: Diagnosis not present

## 2019-04-01 DIAGNOSIS — F330 Major depressive disorder, recurrent, mild: Secondary | ICD-10-CM | POA: Diagnosis not present

## 2019-04-07 DIAGNOSIS — Z03818 Encounter for observation for suspected exposure to other biological agents ruled out: Secondary | ICD-10-CM | POA: Diagnosis not present

## 2019-04-18 ENCOUNTER — Other Ambulatory Visit: Payer: Self-pay

## 2019-04-18 ENCOUNTER — Ambulatory Visit: Payer: Self-pay | Admitting: *Deleted

## 2019-04-18 NOTE — Patient Outreach (Signed)
[facility]) Care Management  04/18/2019  Jasyn Marra Jan 28, 1941 [id number]   Telephone Assessment   Outreach attempt #1 to patient. No answer after multiple rings and unable to leave message.     Plan: RN CM will make outreach attempt to patient within the month of January.   Enzo Montgomery, RN,BSN,CCM [place] Management Telephonic Care Management Coordinator Direct Phone: [phone] Toll Free: [phone] Fax: [phone]

## 2019-05-11 DIAGNOSIS — R350 Frequency of micturition: Secondary | ICD-10-CM | POA: Diagnosis not present

## 2019-05-11 DIAGNOSIS — D649 Anemia, unspecified: Secondary | ICD-10-CM | POA: Diagnosis not present

## 2019-05-11 DIAGNOSIS — E1169 Type 2 diabetes mellitus with other specified complication: Secondary | ICD-10-CM | POA: Diagnosis not present

## 2019-05-11 DIAGNOSIS — F330 Major depressive disorder, recurrent, mild: Secondary | ICD-10-CM | POA: Diagnosis not present

## 2019-05-11 DIAGNOSIS — R634 Abnormal weight loss: Secondary | ICD-10-CM | POA: Diagnosis not present

## 2019-05-11 DIAGNOSIS — I10 Essential (primary) hypertension: Secondary | ICD-10-CM | POA: Diagnosis not present

## 2019-05-11 DIAGNOSIS — R5383 Other fatigue: Secondary | ICD-10-CM | POA: Diagnosis not present

## 2019-05-11 DIAGNOSIS — R05 Cough: Secondary | ICD-10-CM | POA: Diagnosis not present

## 2019-05-12 ENCOUNTER — Ambulatory Visit
Admission: RE | Admit: 2019-05-12 | Discharge: 2019-05-12 | Disposition: A | Discharge status: Home / Self Care | Payer: Medicare HMO | Source: Ambulatory Visit | Attending: Family Medicine | Admitting: Family Medicine

## 2019-05-12 ENCOUNTER — Other Ambulatory Visit: Payer: Self-pay

## 2019-05-12 ENCOUNTER — Other Ambulatory Visit: Payer: Self-pay | Admitting: Family Medicine

## 2019-05-12 DIAGNOSIS — R059 Cough, unspecified: Secondary | ICD-10-CM

## 2019-05-12 DIAGNOSIS — R05 Cough: Secondary | ICD-10-CM

## 2019-05-16 ENCOUNTER — Other Ambulatory Visit: Payer: Self-pay

## 2019-05-16 DIAGNOSIS — E7800 Pure hypercholesterolemia, unspecified: Secondary | ICD-10-CM | POA: Diagnosis not present

## 2019-05-16 DIAGNOSIS — N189 Chronic kidney disease, unspecified: Secondary | ICD-10-CM | POA: Diagnosis not present

## 2019-05-16 DIAGNOSIS — E1165 Type 2 diabetes mellitus with hyperglycemia: Secondary | ICD-10-CM | POA: Diagnosis not present

## 2019-05-16 DIAGNOSIS — I10 Essential (primary) hypertension: Secondary | ICD-10-CM | POA: Diagnosis not present

## 2019-05-16 NOTE — Patient Outreach (Signed)
[facility]) Care Management  05/16/2019  [name] 22-Jun-1940 [id number]   Telephone Assessment    Outreach attempt #2 to patient. Spoke briefly with spouse. She reports that they are just returning home from seeing Diabetes MD. Raechel Chute was unable to accompany patient inside for appt due to COVID restrictions. However, she states that MD is calling in a new med for patient to begin taking. She does not know the name of med yet. Spouse states that patient's blood sugars have been in the 100's-200's and has been dropping some at night to the 90's. Patient is checking cbgs about 2x/day. Spouse reports that she has noticed a decrease in patient's appetite. He has about a seven pound weight loss-unintentional the past month or so. Patient did follow up with PCP last week regarding this. Several test and blood work were done but normal. Spouse reports MD is doing further workup to make sure nothing is going on with patient. Spouse has been encouraging patient to drink at least one Boost per day. She also states that she has noticed that patient is sleeping/napping more during the day. However, he continues to sleep without difficulty all night as well. She denies any recent falls. She voices no RN CM needs or concerns at this time.    Medications Reviewed Today    Reviewed by Verlin Grills, RN (Case Manager) on 01/17/19 at 1512  Med List Status: <None>  Medication Order Taking? Sig Documenting Provider Last Dose Status Informant  ACCU-CHEK AVIVA PLUS test strip [id number] No USE AS INSTRUCTED TO CHECK BLOOD SUGAR THREE TIMES DAILY [name], MD Taking Active Family Member  acetaminophen (TYLENOL) 325 MG tablet [id number] No Take 2 tablets (650 mg total) by mouth every 6 (six) hours as needed for mild pain, fever or headache. [name], MD Taking Active   amLODipine (NORVASC) 10 MG tablet [id number] No Take 1 tablet (10 mg total) by mouth daily. [name], MD Taking  Active   aspirin 81 MG tablet [id number] No Take 81 mg by mouth at bedtime.  [provider] Taking Active Family Member  atorvastatin (LIPITOR) 20 MG tablet [id number] No Take 20 mg by mouth daily. [provider] Taking Active   Blood Glucose Monitoring Suppl (ACCU-CHEK AVIVA PLUS) w/Device KIT [id number] No Use to check blood sugar 3 times per day dx code E11.65 [name], MD Taking Active Family Member  donepezil (ARICEPT) 10 MG tablet [id number] No Take 1 tablet (10 mg total) by mouth at bedtime. [name], MD Taking Active   dorzolamide-timolol (COSOPT) 22.3-6.8 MG/ML ophthalmic solution [id number] No Place 1 drop into both eyes 2 (two) times daily.  [provider] Taking Active Family Member  escitalopram (LEXAPRO) 5 MG tablet [id number] No Take 1 tablet (5 mg total) by mouth at bedtime. [name], MD Taking Active   ferrous sulfate 325 (65 FE) MG tablet [id number] No Take 1 tablet (325 mg total) by mouth 2 (two) times daily. [name], MD Taking Active   fish oil-omega-3 fatty acids 1000 MG capsule [id number] No Take 1 g by mouth daily.  [provider] Taking Active Family Member  hydroxypropyl methylcellulose / hypromellose (ISOPTO TEARS / GONIOVISC) 2.5 % ophthalmic solution [id number] No Place 1 drop into both eyes 4 (four) times daily as needed for dry eyes.  [provider] Taking Active Family Member  indomethacin (INDOCIN) 50 MG capsule [id number] No Take 1 capsule (50 mg total) by mouth 2 (two) times daily as needed (  gout). Take with food [name], MD Taking Active   Insulin Detemir (LEVEMIR FLEXTOUCH) 100 UNIT/ML Pen [id number] No Inject 26 units daily [name], [name], MD Taking Active   insulin regular (NOVOLIN R RELION) 100 units/mL injection [id number] No Inject 0.05-0.15 mLs (5-15 Units total) into the skin 3 (three) times daily with meals as needed for high blood sugar (per sliding scale). [name], MD Taking  Active   Insulin Syringe-Needle U-100 (INSULIN SYRINGE .5CC/31GX5/16") 31G X 5/16" 0.5 ML MISC [id number] No Use one to inject insulin daily [name], MD Taking Active Family Member  latanoprost (XALATAN) 0.005 % ophthalmic solution [id number] No Place 1 drop into both eyes at bedtime.  [provider] Taking Active Family Member           Med Note (HARDEN, SUMMER D   Mon Oct 26, 2017  5:16 PM)    liraglutide (VICTOZA) 18 MG/3ML SOPN [id number] No Inject 0.2 mLs (1.2 mg total) into the skin daily. [name], MD Taking Active   meclizine (ANTIVERT) 25 MG tablet [id number] No Take 1 tablet (25 mg total) by mouth 3 (three) times daily as needed for dizziness. [name], MD Taking Active Family Member  metFORMIN (GLUCOPHAGE) 1000 MG tablet [id number] No Take 1 tablet (1,000 mg total) by mouth 2 (two) times daily with a meal. [name], MD Taking Active            Med Note [name], [name]   Wed Nov 18, 2017  2:07 PM)    metoprolol tartrate (LOPRESSOR) 25 MG tablet [id number] No Take 1 tablet (25 mg total) by mouth 2 (two) times daily. [name], MD Taking Active   PREDNISOLONE ACETATE OP [id number] No Place 1 drop into the right eye daily. [provider] Taking Active Family Member           Med Note [facility] MENDEZ, CARLOS A   Tue Jan 04, 2019  8:29 AM)    simvastatin (ZOCOR) 20 MG tablet [id number] No Take 1 tablet (20 mg total) by mouth daily at 6 PM. [name], MD Taking Active   tamsulosin (FLOMAX) 0.4 MG CAPS capsule [id number] No Take 1 capsule (0.4 mg total) by mouth at bedtime. [name], MD Taking Active           [facility] CM Care Plan Problem One     Most Recent Value  Care Plan Problem One  Knowledge Deficit in Self Management of Diabetes  Role Documenting the Problem One  Care Management Telephonic Coordinator  Care Plan for Problem One  Active  [facility] Long Term Goal   Patient/spouse will report a decrease/lowering of A1C level from 8.5  over the next 90 days.  THN Long Term Goal Start Date  05/16/19  Interventions for Problem One Long Term Goal  RN CM reviewed and discussed A1C monitoring, cbg montioring and values. RN CM reinforced daibetic diet and med adherence.  THN CM Short Term Goal #1   Patient will report drinking at least one nutritional supplement per day to aide in caloric intake over the next 30 days.  THN CM Short Term Goal #1 Start Date  05/16/19  Interventions for Short Term Goal #1  RN CM completed nutrition assessment. RN CM discussed with spouse ways to improve oral intake.   THN CM Short Term Goal #2   Patient will report no furhter unintentional wgt loss over the next 30 days.  THN CM Short Term Goal #2 Start Date  05/16/19  Interventions for Short Term Goal #2  RN CM discussed nutrition measures with spouse and ways to help manage. RN CM discussed with spouse need for MD follow up for any furhter significant wgt loss.      Plan: RN CM discussed with patient next outreach within the month of March. Patient gave verbal consent and in agreement with RN CM follow up and timeframe. Patient aware that they may contact RN CM sooner for any issues or concerns. RN CM will send quarterly update to PCP.   Enzo Montgomery, RN,BSN,CCM [place] Management Telephonic Care Management Coordinator Direct Phone: [phone] Toll Free: [phone] Fax: [phone]

## 2019-05-17 ENCOUNTER — Ambulatory Visit: Payer: Self-pay

## 2019-05-21 ENCOUNTER — Ambulatory Visit: Payer: Medicare HMO

## 2019-05-26 ENCOUNTER — Ambulatory Visit: Payer: Medicare HMO | Attending: Internal Medicine

## 2019-05-26 DIAGNOSIS — Z23 Encounter for immunization: Secondary | ICD-10-CM | POA: Insufficient documentation

## 2019-05-26 NOTE — Progress Notes (Signed)
   Covid-19 Vaccination Clinic  Name:  Keaten Lira    MRN: [id number] DOB: 1940/06/05  05/26/2019  Mr. Antelo was observed post Covid-19 immunization for 15 minutes without incidence. He was provided with Vaccine Information Sheet and instruction to access the V-Safe system.   Mr. Mashni was instructed to call 911 with any severe reactions post vaccine: Marland Kitchen Difficulty breathing  . Swelling of your face and throat  . A fast heartbeat  . A bad rash all over your body  . Dizziness and weakness    Immunizations Administered    Name Date Dose VIS Date Route   Pfizer COVID-19 Vaccine 05/26/2019  3:37 PM 0.3 mL 04/01/2019 Intramuscular   Manufacturer: [place]   Lot: [id number]   [place]: [id number]

## 2019-05-30 DIAGNOSIS — E7800 Pure hypercholesterolemia, unspecified: Secondary | ICD-10-CM | POA: Diagnosis not present

## 2019-05-30 DIAGNOSIS — N189 Chronic kidney disease, unspecified: Secondary | ICD-10-CM | POA: Diagnosis not present

## 2019-05-30 DIAGNOSIS — E1165 Type 2 diabetes mellitus with hyperglycemia: Secondary | ICD-10-CM | POA: Diagnosis not present

## 2019-05-30 DIAGNOSIS — I10 Essential (primary) hypertension: Secondary | ICD-10-CM | POA: Diagnosis not present

## 2019-06-01 ENCOUNTER — Ambulatory Visit: Payer: Medicare HMO

## 2019-06-03 DIAGNOSIS — Z961 Presence of intraocular lens: Secondary | ICD-10-CM | POA: Diagnosis not present

## 2019-06-03 DIAGNOSIS — H401123 Primary open-angle glaucoma, left eye, severe stage: Secondary | ICD-10-CM | POA: Diagnosis not present

## 2019-06-03 DIAGNOSIS — H401112 Primary open-angle glaucoma, right eye, moderate stage: Secondary | ICD-10-CM | POA: Diagnosis not present

## 2019-06-07 ENCOUNTER — Other Ambulatory Visit: Payer: Self-pay | Admitting: Family Medicine

## 2019-06-07 DIAGNOSIS — R634 Abnormal weight loss: Secondary | ICD-10-CM

## 2019-06-07 DIAGNOSIS — D649 Anemia, unspecified: Secondary | ICD-10-CM

## 2019-06-10 ENCOUNTER — Other Ambulatory Visit: Payer: Self-pay

## 2019-06-10 ENCOUNTER — Ambulatory Visit
Admission: RE | Admit: 2019-06-10 | Discharge: 2019-06-10 | Disposition: A | Discharge status: Home / Self Care | Payer: Medicare HMO | Source: Ambulatory Visit | Attending: Family Medicine | Admitting: Family Medicine

## 2019-06-10 DIAGNOSIS — R634 Abnormal weight loss: Secondary | ICD-10-CM

## 2019-06-10 DIAGNOSIS — D649 Anemia, unspecified: Secondary | ICD-10-CM | POA: Diagnosis not present

## 2019-06-10 DIAGNOSIS — N3289 Other specified disorders of bladder: Secondary | ICD-10-CM | POA: Diagnosis not present

## 2019-06-10 MED ORDER — IOPAMIDOL (ISOVUE-300) INJECTION 61%
100.0000 mL | Freq: Once | INTRAVENOUS | Status: AC | PRN
  Administered 2019-06-10: 100 mL via INTRAVENOUS

## 2019-06-16 DIAGNOSIS — E785 Hyperlipidemia, unspecified: Secondary | ICD-10-CM | POA: Diagnosis not present

## 2019-06-16 DIAGNOSIS — Z0000 Encounter for general adult medical examination without abnormal findings: Secondary | ICD-10-CM | POA: Diagnosis not present

## 2019-06-16 DIAGNOSIS — Z8551 Personal history of malignant neoplasm of bladder: Secondary | ICD-10-CM | POA: Diagnosis not present

## 2019-06-16 DIAGNOSIS — F330 Major depressive disorder, recurrent, mild: Secondary | ICD-10-CM | POA: Diagnosis not present

## 2019-06-16 DIAGNOSIS — I10 Essential (primary) hypertension: Secondary | ICD-10-CM | POA: Diagnosis not present

## 2019-06-16 DIAGNOSIS — M109 Gout, unspecified: Secondary | ICD-10-CM | POA: Diagnosis not present

## 2019-06-16 DIAGNOSIS — E1169 Type 2 diabetes mellitus with other specified complication: Secondary | ICD-10-CM | POA: Diagnosis not present

## 2019-06-16 DIAGNOSIS — Z8546 Personal history of malignant neoplasm of prostate: Secondary | ICD-10-CM | POA: Diagnosis not present

## 2019-06-16 DIAGNOSIS — I700 Atherosclerosis of aorta: Secondary | ICD-10-CM | POA: Diagnosis not present

## 2019-06-20 ENCOUNTER — Ambulatory Visit: Payer: Medicare HMO | Attending: Internal Medicine

## 2019-06-20 DIAGNOSIS — Z23 Encounter for immunization: Secondary | ICD-10-CM

## 2019-06-20 NOTE — Progress Notes (Signed)
   Covid-19 Vaccination Clinic  Name:  [name]    MRN: [id number] DOB: 1940-10-15  06/20/2019  [name] was observed post Covid-19 immunization for 15 minutes without incidence. He was provided with Vaccine Information Sheet and instruction to access the V-Safe system.   [name] was instructed to call 911 with any severe reactions post vaccine: Marland Kitchen Difficulty breathing  . Swelling of your face and throat  . A fast heartbeat  . A bad rash all over your body  . Dizziness and weakness    Immunizations Administered    Name Date Dose VIS Date Route   Pfizer COVID-19 Vaccine 06/20/2019  2:13 PM 0.3 mL 04/01/2019 Intramuscular   Manufacturer: [place]   Lot: [id number]   [place]: [id number]

## 2019-06-27 DIAGNOSIS — H26492 Other secondary cataract, left eye: Secondary | ICD-10-CM | POA: Diagnosis not present

## 2019-06-28 ENCOUNTER — Other Ambulatory Visit: Payer: Self-pay

## 2019-06-28 NOTE — Patient Outreach (Signed)
[facility]) Care Management  06/28/2019  Leiden Purple 15-May-1940 [id number]   Telephone Assessment   Outreach attempt # 1 to patient. No answer. RN CM left HIPAA compliant voicemail message along with contact info.     Plan: RN CM will make outreach attempt to patient within the month of April if no return call.    Enzo Montgomery, RN,BSN,CCM [place] Management Telephonic Care Management Coordinator Direct Phone: [phone] Toll Free: [phone] Fax: [phone]

## 2019-08-25 ENCOUNTER — Other Ambulatory Visit: Payer: Self-pay

## 2019-08-25 NOTE — Patient Outreach (Signed)
[facility]) Care Management  08/25/2019  [name] [birth year] [id number]   Telephone Assessment    Outreach attempt to patient/spouse. Spoke with spouse who reports that patient is still resting at present. She denies any acute issues or concerns with patient. She voices he has bene doing and feeling well. He has seen all his MD within the past two months. Appetite remains WNL for patient. She is unsure about wgt. Spouse reviewed cbg monitoring log and cbgs have been ranging in the 80s-130s. They are checking cbgs 2x/day. Last A1C was 8.8(Jan 2021). She denies any recent falls. Patient remains independent with ADLs. She confirms that patient has received both doses of COVID-19 vaccine and tolerated them well. She denies any RN CM needs or concerns at this time.    Medications Reviewed Today    Reviewed by [name], RN (Registered Nurse) on 08/25/19 at 1016  Med List Status: <None>  Medication Order Taking? Sig Documenting Provider Last Dose Status Informant  ACCU-CHEK AVIVA PLUS test strip [id number] No USE AS INSTRUCTED TO CHECK BLOOD SUGAR THREE TIMES DAILY [name], MD Taking Active Family Member  acetaminophen (TYLENOL) 325 MG tablet [id number] No Take 2 tablets (650 mg total) by mouth every 6 (six) hours as needed for mild pain, fever or headache. [name], MD Taking Active   amLODipine (NORVASC) 10 MG tablet [id number] No Take 1 tablet (10 mg total) by mouth daily. [name], MD Taking Active   aspirin 81 MG tablet [id number] No Take 81 mg by mouth at bedtime.  [provider] Taking Active Family Member  atorvastatin (LIPITOR) 20 MG tablet [id number] No Take 20 mg by mouth daily. [provider] Taking Active   Blood Glucose Monitoring Suppl (ACCU-CHEK AVIVA PLUS) w/Device KIT [id number] No Use to check blood sugar 3 times per day dx code E11.65 [name], MD Taking Active Family Member  donepezil (ARICEPT) 10 MG  tablet [id number] No Take 1 tablet (10 mg total) by mouth at bedtime. [name], MD Taking Active   dorzolamide-timolol (COSOPT) 22.3-6.8 MG/ML ophthalmic solution [id number] No Place 1 drop into both eyes 2 (two) times daily.  [provider] Taking Active Family Member  escitalopram (LEXAPRO) 5 MG tablet [id number] No Take 1 tablet (5 mg total) by mouth at bedtime. [name], MD Taking Active   ferrous sulfate 325 (65 FE) MG tablet [id number] No Take 1 tablet (325 mg total) by mouth 2 (two) times daily. [name], MD Taking Active   fish oil-omega-3 fatty acids 1000 MG capsule [id number] No Take 1 g by mouth daily.  [provider] Taking Active Family Member  hydroxypropyl methylcellulose / hypromellose (ISOPTO TEARS / GONIOVISC) 2.5 % ophthalmic solution [id number] No Place 1 drop into both eyes 4 (four) times daily as needed for dry eyes.  [provider] Taking Active Family Member  indomethacin (INDOCIN) 50 MG capsule [id number] No Take 1 capsule (50 mg total) by mouth 2 (two) times daily as needed (gout). Take with food [name], MD Taking Active   Insulin Detemir (LEVEMIR FLEXTOUCH) 100 UNIT/ML Pen [id number] No Inject 26 units daily [name], [name], MD Taking Active   insulin regular (NOVOLIN R RELION) 100 units/mL injection [id number] No Inject 0.05-0.15 mLs (5-15 Units total) into the skin 3 (three) times daily with meals as needed for high blood sugar (per sliding scale). [name], MD Taking Active   Insulin Syringe-Needle U-100 (INSULIN SYRINGE .5CC/31GX5/16") 31G X 5/16" 0.5 ML MISC [id number] No  Use one to inject insulin daily [name], MD Taking Active Family Member  latanoprost (XALATAN) 0.005 % ophthalmic solution [id number] No Place 1 drop into both eyes at bedtime.  [provider] Taking Active Family Member           Med Note (HARDEN, SUMMER D   Mon Oct 26, 2017  5:16 PM)    liraglutide (VICTOZA) 18 MG/3ML SOPN  [id number] No Inject 0.2 mLs (1.2 mg total) into the skin daily. [name], MD Taking Active   meclizine (ANTIVERT) 25 MG tablet [id number] No Take 1 tablet (25 mg total) by mouth 3 (three) times daily as needed for dizziness. [name], MD Taking Active Family Member  metFORMIN (GLUCOPHAGE) 1000 MG tablet [id number] No Take 1 tablet (1,000 mg total) by mouth 2 (two) times daily with a meal. [name], MD Taking Active            Med Note [name], [name]   Wed Nov 18, 2017  2:07 PM)    metoprolol tartrate (LOPRESSOR) 25 MG tablet [id number] No Take 1 tablet (25 mg total) by mouth 2 (two) times daily. [name], MD Taking Active   PREDNISOLONE ACETATE OP [id number] No Place 1 drop into the right eye daily. [provider] Taking Active Family Member           Med Note [facility] MENDEZ, CARLOS A   Tue Jan 04, 2019  8:29 AM)    simvastatin (ZOCOR) 20 MG tablet [id number] No Take 1 tablet (20 mg total) by mouth daily at 6 PM. [name], MD Taking Active   tamsulosin (FLOMAX) 0.4 MG CAPS capsule [id number] No Take 1 capsule (0.4 mg total) by mouth at bedtime. [name], MD Taking Active          [facility] CM Care Plan Problem One     Most Recent Value  Care Plan Problem One  Knowledge Deficit in Self Management of Diabetes  Role Documenting the Problem One  Care Management Telephonic Coordinator  Care Plan for Problem One  Active  [facility] Long Term Goal   Patient/spouse will report a decrease/lowering of A1C level from 8.5 over the next 90 days.  THN Long Term Goal Start Date  05/16/19  Interventions for Problem One Long Term Goal  RN CM reviewed with spouse cbg montiroing log and values. RN CM assessed for most recent A1C elvel. RN CM reinforced importance of adherence to daibetic diet.  THN CM Short Term Goal #1   Patient will report drinking at least one nutritional supplement per day to aide in caloric intake over the next 30 days.  THN CM Short Term Goal #1  Start Date  05/16/19  THN CM Short Term Goal #1 Met Date  08/25/19  THN CM Short Term Goal #2   Patient will report no furhter unintentional wgt loss over the next 30 days.  THN CM Short Term Goal #2 Start Date  05/16/19  [facility] CM Short Term Goal #2 Met Date  08/25/19     Plan: RN CM discussed with spouse outreach within the month of August. She gave verbal consent and in agreement with RN CM follow up and timeframe. She is are that she may contact RN CM sooner for any issues or concerns.  RN CM will send quarterly update to PCP.   Enzo Montgomery, RN,BSN,CCM [place] Management Telephonic Care Management Coordinator Direct Phone: [phone] Toll Free: [phone] Fax: [phone]

## 2019-09-20 DIAGNOSIS — E7800 Pure hypercholesterolemia, unspecified: Secondary | ICD-10-CM | POA: Diagnosis not present

## 2019-09-20 DIAGNOSIS — E1165 Type 2 diabetes mellitus with hyperglycemia: Secondary | ICD-10-CM | POA: Diagnosis not present

## 2019-09-27 DIAGNOSIS — N189 Chronic kidney disease, unspecified: Secondary | ICD-10-CM | POA: Diagnosis not present

## 2019-09-27 DIAGNOSIS — I10 Essential (primary) hypertension: Secondary | ICD-10-CM | POA: Diagnosis not present

## 2019-09-27 DIAGNOSIS — E7800 Pure hypercholesterolemia, unspecified: Secondary | ICD-10-CM | POA: Diagnosis not present

## 2019-09-27 DIAGNOSIS — E1165 Type 2 diabetes mellitus with hyperglycemia: Secondary | ICD-10-CM | POA: Diagnosis not present

## 2019-10-03 DIAGNOSIS — E119 Type 2 diabetes mellitus without complications: Secondary | ICD-10-CM | POA: Diagnosis not present

## 2019-10-03 DIAGNOSIS — H401112 Primary open-angle glaucoma, right eye, moderate stage: Secondary | ICD-10-CM | POA: Diagnosis not present

## 2019-10-03 DIAGNOSIS — Z961 Presence of intraocular lens: Secondary | ICD-10-CM | POA: Diagnosis not present

## 2019-10-03 DIAGNOSIS — H401123 Primary open-angle glaucoma, left eye, severe stage: Secondary | ICD-10-CM | POA: Diagnosis not present

## 2019-10-03 DIAGNOSIS — Z794 Long term (current) use of insulin: Secondary | ICD-10-CM | POA: Diagnosis not present

## 2019-10-10 DIAGNOSIS — E1165 Type 2 diabetes mellitus with hyperglycemia: Secondary | ICD-10-CM | POA: Diagnosis not present

## 2019-10-10 DIAGNOSIS — E7800 Pure hypercholesterolemia, unspecified: Secondary | ICD-10-CM | POA: Diagnosis not present

## 2019-10-10 DIAGNOSIS — N189 Chronic kidney disease, unspecified: Secondary | ICD-10-CM | POA: Diagnosis not present

## 2019-10-10 DIAGNOSIS — I10 Essential (primary) hypertension: Secondary | ICD-10-CM | POA: Diagnosis not present

## 2019-11-28 ENCOUNTER — Other Ambulatory Visit: Payer: Self-pay

## 2019-11-28 NOTE — Patient Outreach (Signed)
[facility]) Care Management  11/28/2019  Joon Pohle March 08, 1941 [id number]   Telephone Assessment Quarterly Call    Outreach attempt # 1 to patient/spouse. No answer after multiple rings.     Plan: RN CM will make outreach attempt within the month of Oct.if no return call.    Enzo Montgomery, RN,BSN,CCM [place] [facility] Telephonic Care Management Coordinator Direct Phone: [phone] Toll Free: [phone] Fax: [phone]

## 2019-12-01 ENCOUNTER — Ambulatory Visit: Payer: Self-pay

## 2019-12-06 IMAGING — DX DG KNEE COMPLETE 4+V*L*
4 series · 4 of 4 positions shown · non-contrast
Comparison: None

CLINICAL DATA: Atraumatic knee pain and swelling

EXAM:
LEFT KNEE - COMPLETE 4+ VIEW

[t knee ap left]
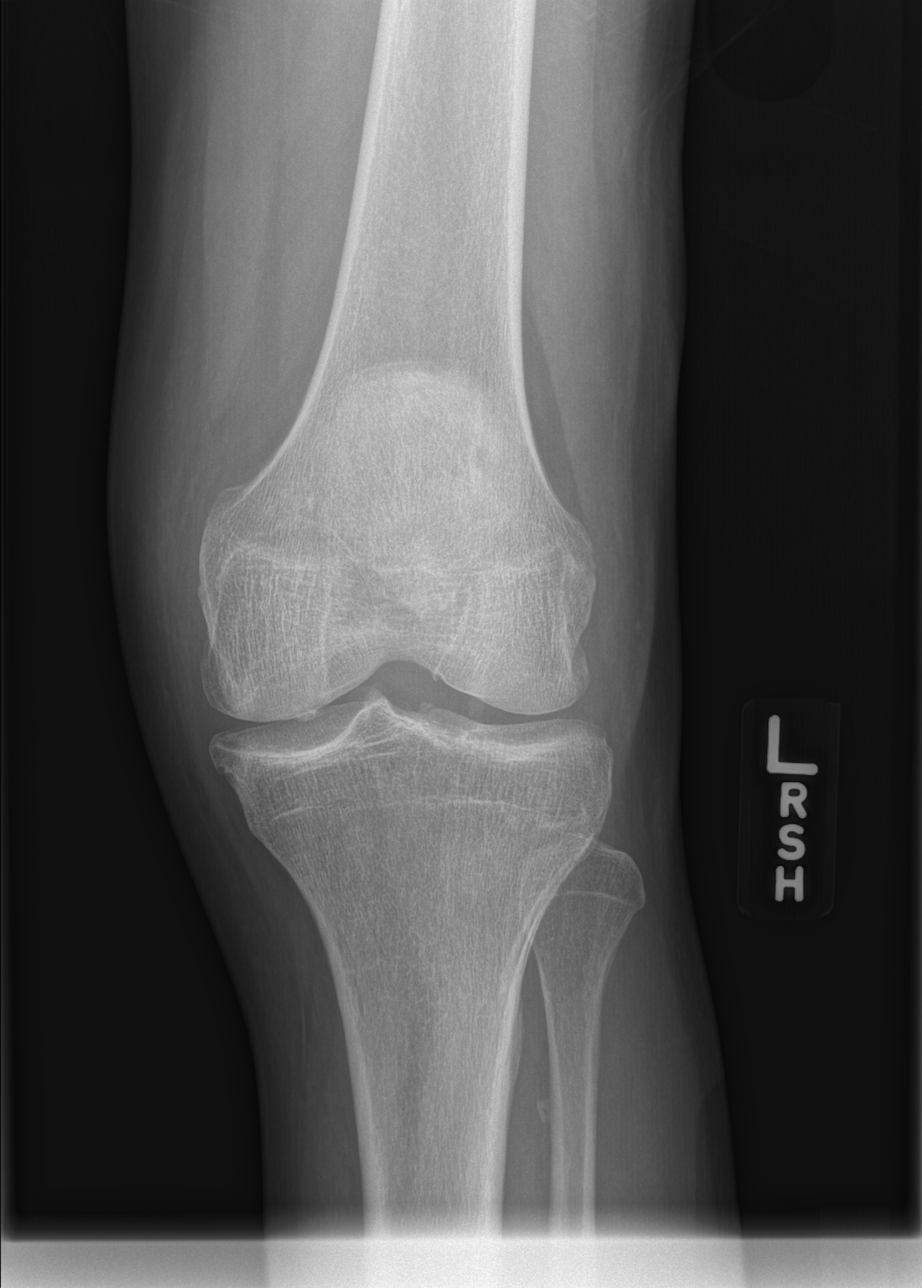

[t knee obl left (1 of 2)]
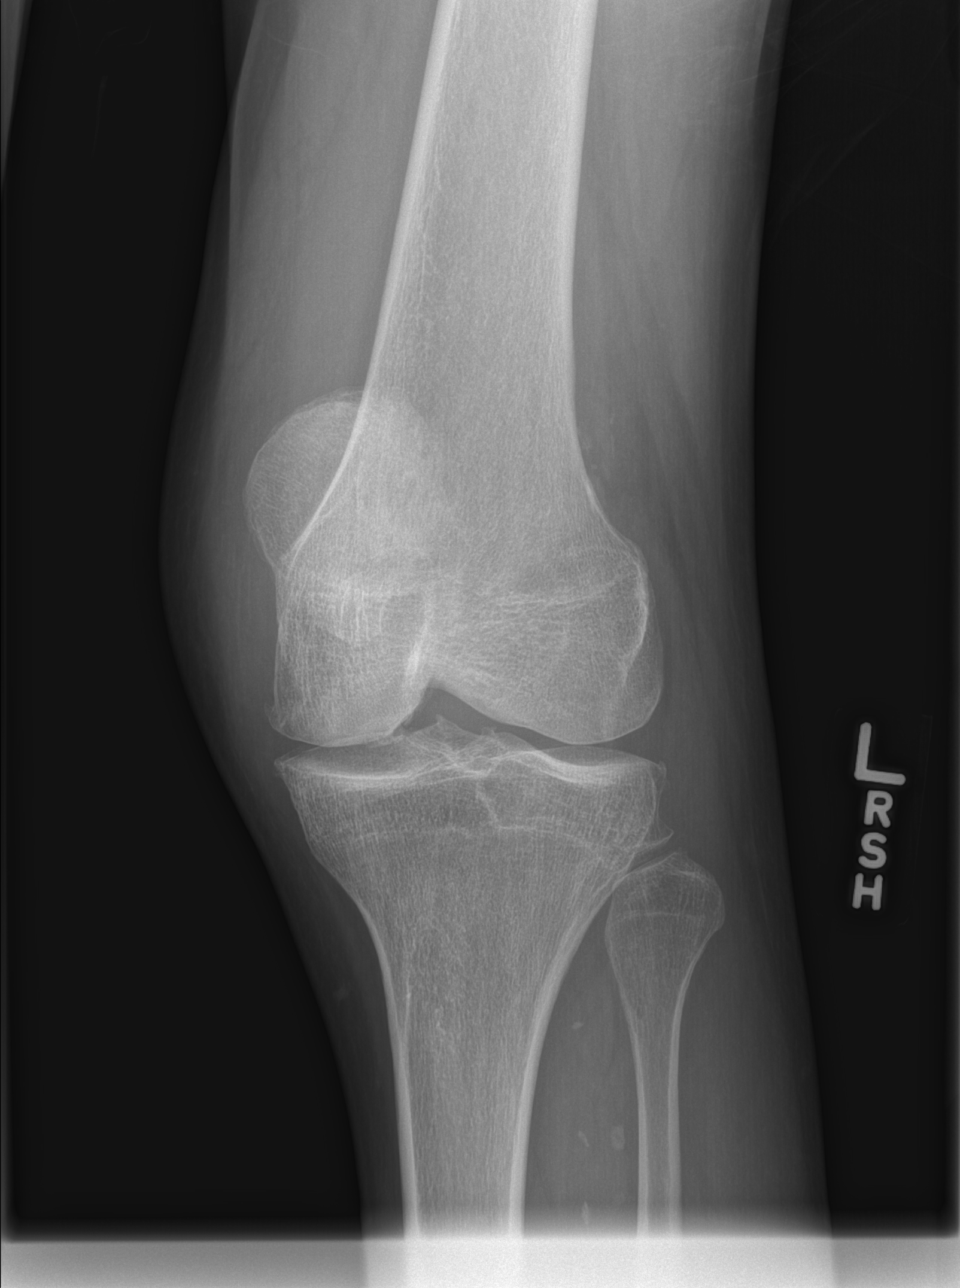

[t knee obl left (2 of 2)]
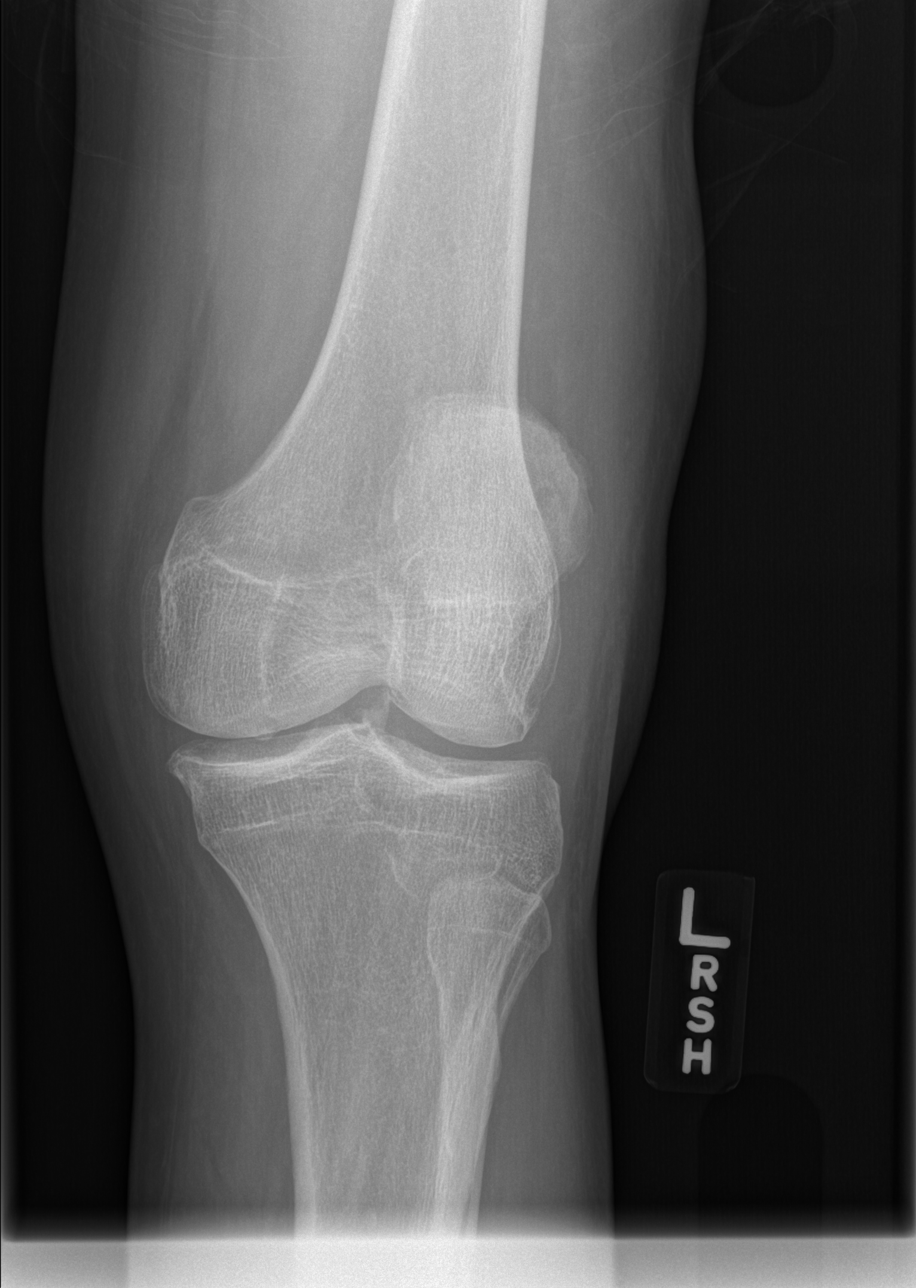

[x knee lat left]
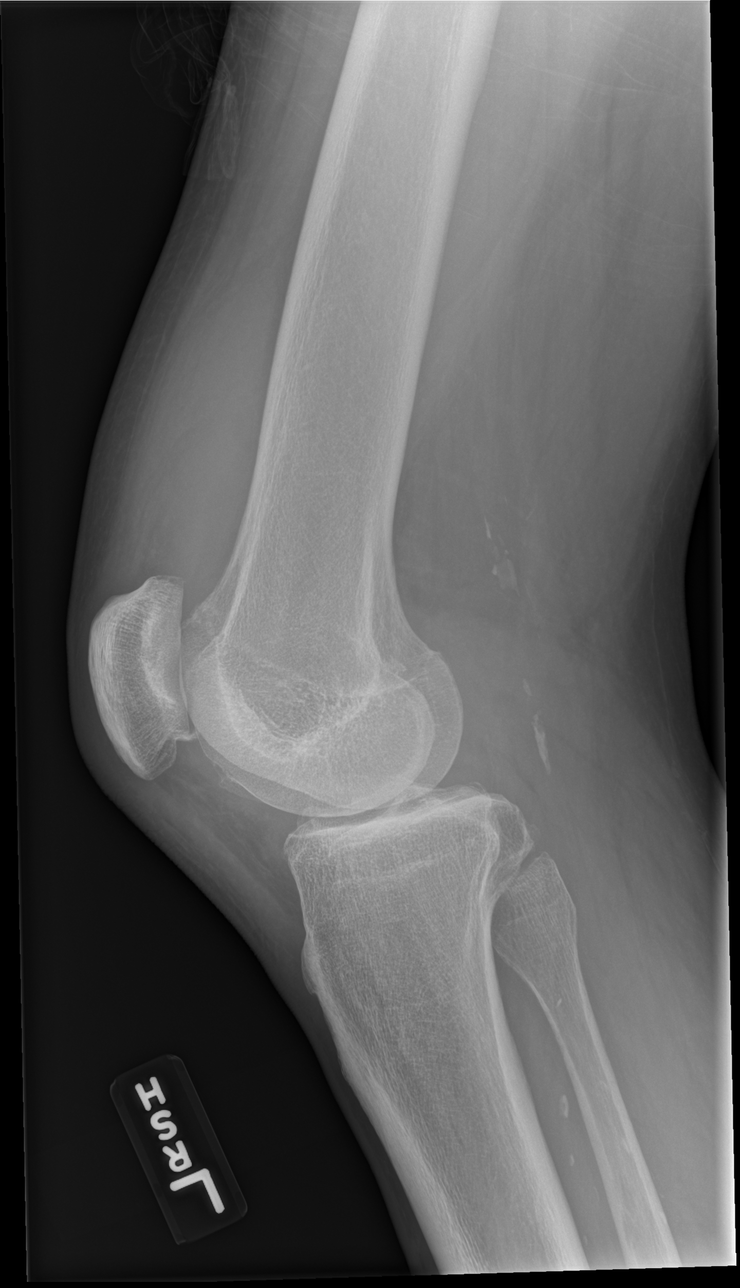

[4 of 4 positions shown; findings below may reference images not displayed]

FINDINGS: Diffuse osseous demineralization.

Tiny marginal spurs at medial compartment.

Joint spaces fairly well preserved.

Large joint effusion.

No acute fracture, dislocation, or bone destruction.

Scattered atherosclerotic calcifications.
IMPRESSION: Large LEFT knee joint effusion without acute bony abnormalities.

## 2019-12-06 IMAGING — DX DG CHEST 2V
2 series · 2 of 2 positions shown · non-contrast
Comparison: 12/26/2010 chest radiograph.

CLINICAL DATA: 76 y/o  M; cough and shortness of breath.

EXAM:
CHEST - 2 VIEW

[x chest ap]
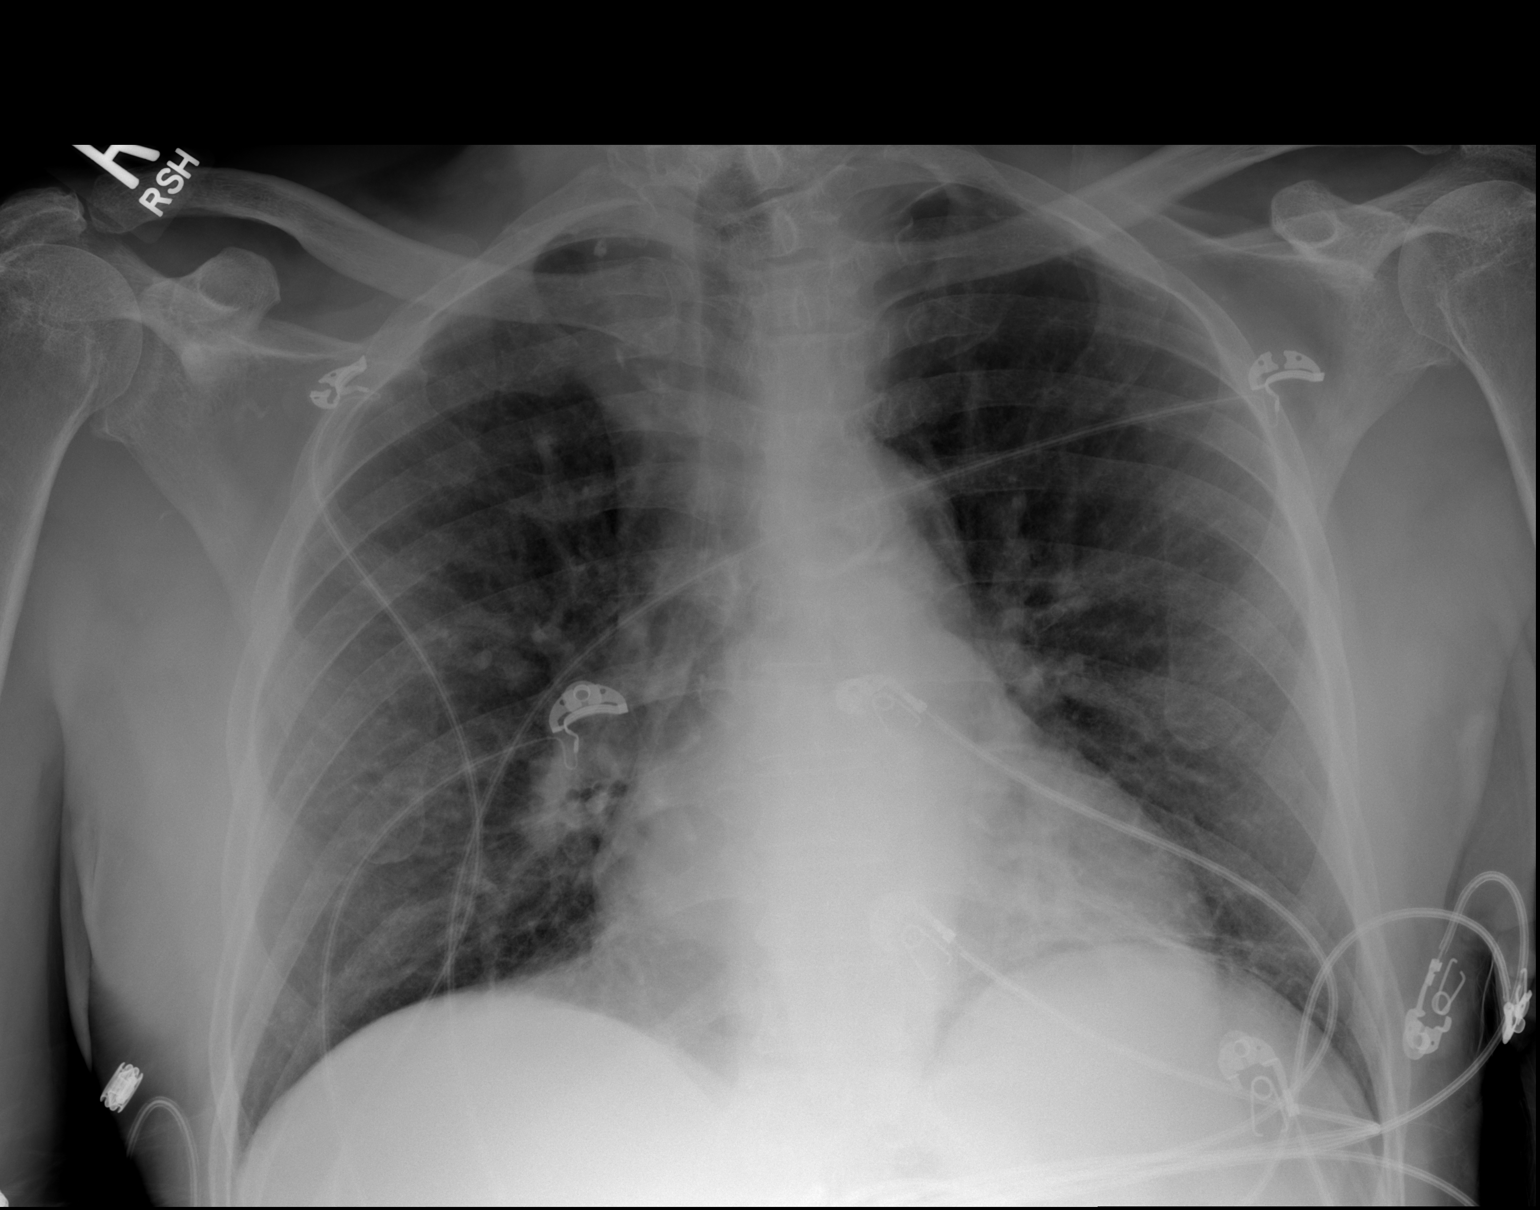

[w chest lat]
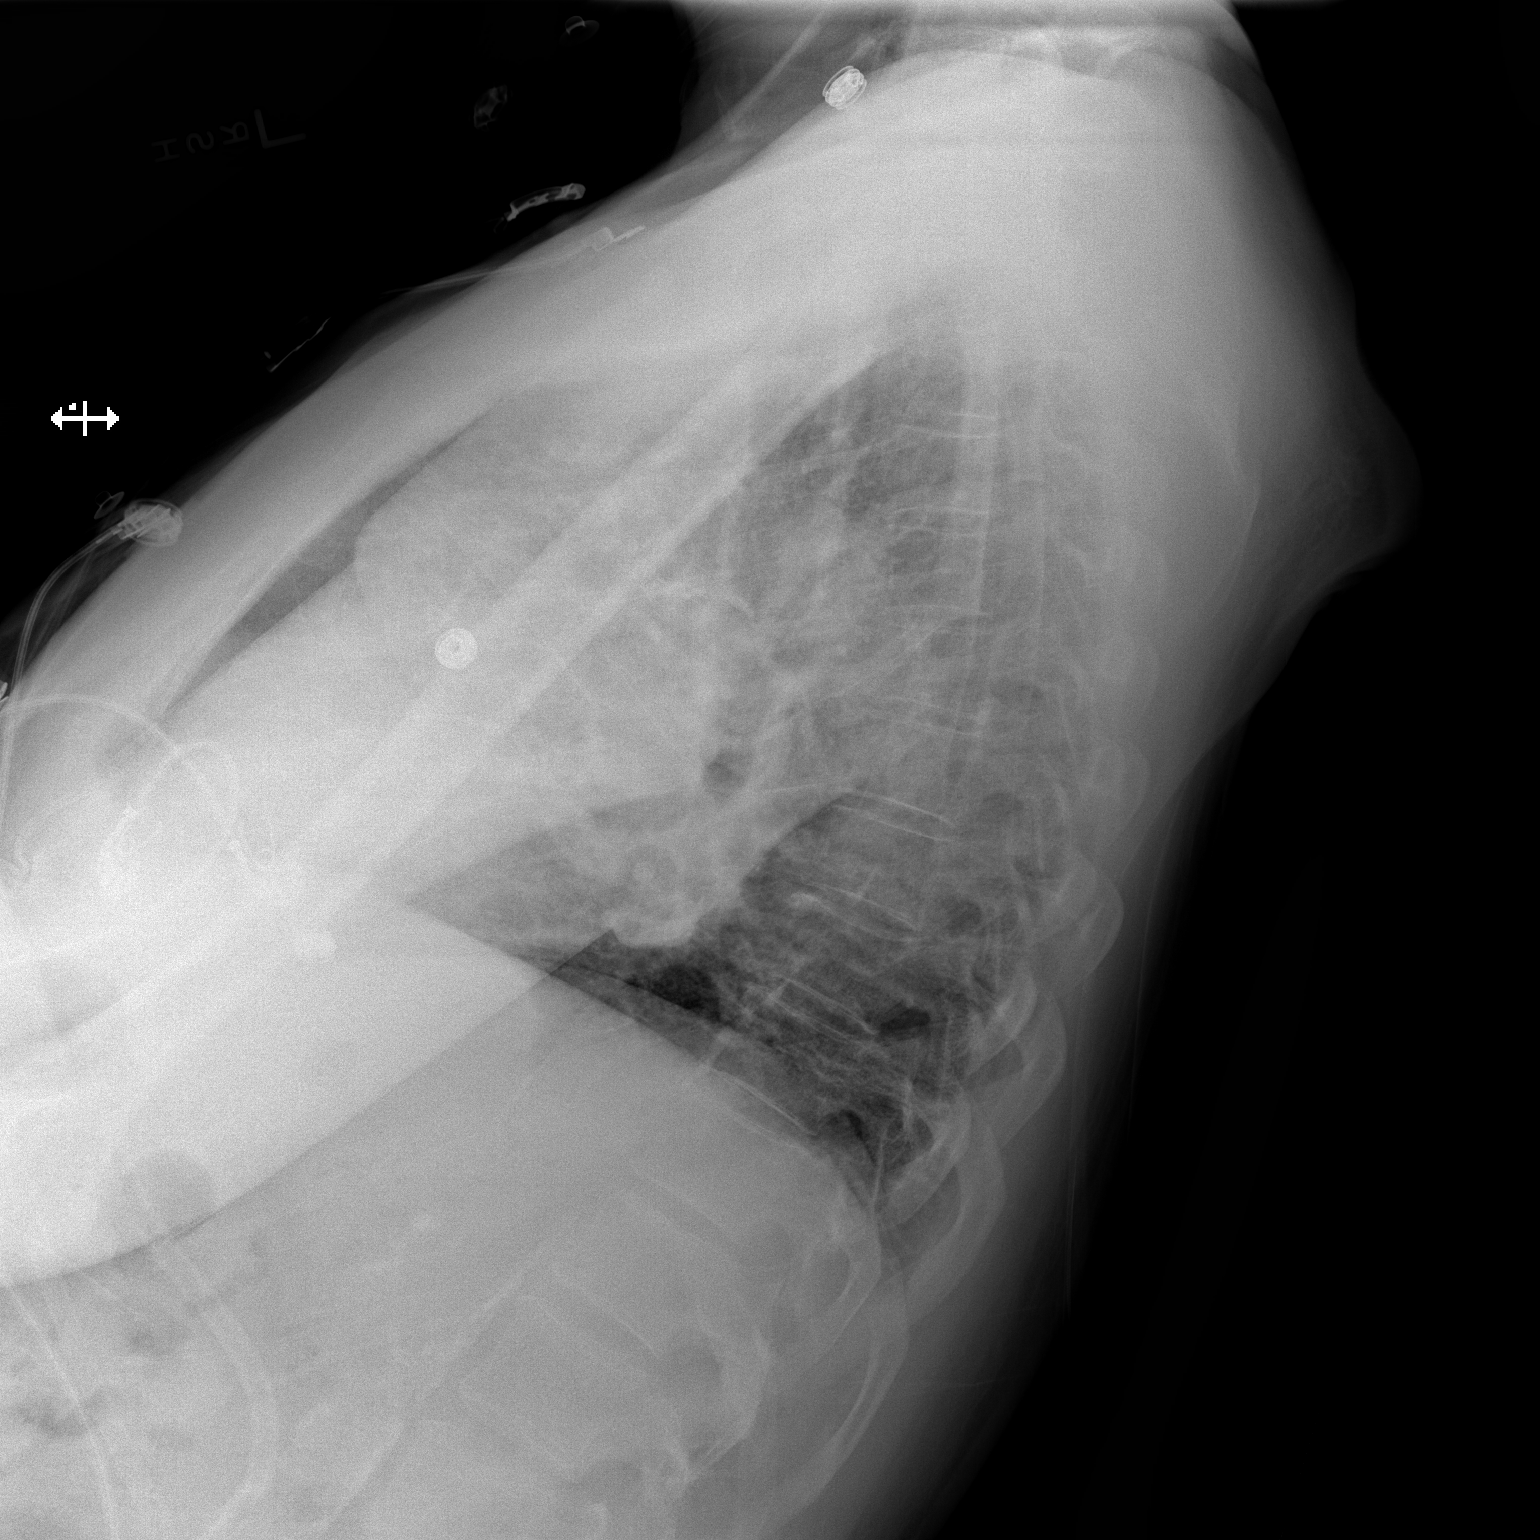

[2 of 2 positions shown; findings below may reference images not displayed]

FINDINGS: Stable cardiac silhouette given projection and technique. Aortic
atherosclerosis with calcification. Pulmonary venous hypertension.
Streaky opacity in left lung base, probably atelectasis. No pleural
effusion or pneumothorax. No acute osseous abnormality is evident.
IMPRESSION: Pulmonary venous hypertension. Left basilar atelectasis. Aortic
atherosclerosis.

By: Jaylon Aujla M.D.

## 2019-12-14 DIAGNOSIS — E785 Hyperlipidemia, unspecified: Secondary | ICD-10-CM | POA: Diagnosis not present

## 2019-12-14 DIAGNOSIS — R413 Other amnesia: Secondary | ICD-10-CM | POA: Diagnosis not present

## 2019-12-14 DIAGNOSIS — I10 Essential (primary) hypertension: Secondary | ICD-10-CM | POA: Diagnosis not present

## 2019-12-14 DIAGNOSIS — F330 Major depressive disorder, recurrent, mild: Secondary | ICD-10-CM | POA: Diagnosis not present

## 2019-12-24 ENCOUNTER — Ambulatory Visit (HOSPITAL_COMMUNITY)
Admission: EM | Admit: 2019-12-24 | Discharge: 2019-12-24 | Disposition: A | Discharge status: Home / Self Care | Payer: Medicare HMO | Attending: Family Medicine | Admitting: Family Medicine

## 2019-12-24 ENCOUNTER — Other Ambulatory Visit: Payer: Self-pay

## 2019-12-24 ENCOUNTER — Encounter (HOSPITAL_COMMUNITY): Payer: Self-pay | Admitting: Family Medicine

## 2019-12-24 DIAGNOSIS — M109 Gout, unspecified: Secondary | ICD-10-CM

## 2019-12-24 MED ORDER — INDOMETHACIN 50 MG PO CAPS: 50.0000 mg | ORAL_CAPSULE | Freq: Two times a day (BID) | ORAL | 1 refills | Status: DC | PRN

## 2019-12-24 NOTE — ED Provider Notes (Signed)
[place]    CSN: [id number] Arrival date & time: 12/24/19  1344      History   Chief Complaint No chief complaint on file.   HPI Arda Keadle is a 79 y.o. male.   Patient is a 79 year old male past medical history of diabetes, heart disease, high cholesterol, hypertension.  He presents today with left foot pain, swelling, redness.  This has been worsening over the last week.  Does have history of gout.  Denies any falls or injuries to the foot.  Has not taken anything for his pain.     Past Medical History:  Diagnosis Date  . Diabetes mellitus    insulin dependent  . Heart disease, unspecified   . Hypercholesterolemia   . Hypertension   . Sarcoidosis   . Unspecified glaucoma(365.9)     Patient Active Problem List   Diagnosis Date Noted  . Fever in adult 10/26/2017  . Bladder cancer ([place]) 10/26/2017  . Mediastinal lymphadenopathy 10/26/2017  . Polyarthralgia 10/26/2017  . Effusion of left knee joint 10/26/2017  . Angioedema 02/01/2014  . Essential hypertension 02/01/2014  . Type 2 diabetes mellitus with diabetic nephropathy ([place]) 02/01/2014  . Hyperlipemia 02/01/2014  . Pure hypercholesterolemia 12/08/2012  . Type 2 diabetes mellitus with other specified complication ([place]) 50/12/3816  . Unspecified essential hypertension 12/06/2012  . Prostate cancer ([place]) 04/30/2011    Past Surgical History:  Procedure Laterality Date  . CATARACT EXTRACTION    . knee replacement surgery     right       Home Medications    Prior to Admission medications   Medication Sig Start Date End Date Taking? Authorizing Provider  ACCU-CHEK AVIVA PLUS test strip USE AS INSTRUCTED TO CHECK BLOOD SUGAR THREE TIMES DAILY 11/11/16   [name], MD  acetaminophen (TYLENOL) 325 MG tablet Take 2 tablets (650 mg total) by mouth every 6 (six) hours as needed for mild pain, fever or headache. 10/30/17   [place] Brick, [name], MD  amLODipine (NORVASC) 10 MG tablet Take 1 tablet (10  mg total) by mouth daily. 10/30/17   [name], MD  aspirin 81 MG tablet Take 81 mg by mouth at bedtime.     [provider]  atorvastatin (LIPITOR) 20 MG tablet Take 20 mg by mouth daily. 12/16/18   [provider]  Blood Glucose Monitoring Suppl (ACCU-CHEK AVIVA PLUS) w/Device KIT Use to check blood sugar 3 times per day dx code E11.65 03/12/16   [name], MD  donepezil (ARICEPT) 10 MG tablet Take 1 tablet (10 mg total) by mouth at bedtime. 10/30/17   [name], MD  dorzolamide-timolol (COSOPT) 22.3-6.8 MG/ML ophthalmic solution Place 1 drop into both eyes 2 (two) times daily.     [provider]  escitalopram (LEXAPRO) 5 MG tablet Take 1 tablet (5 mg total) by mouth at bedtime. 10/30/17   [name], MD  ferrous sulfate 325 (65 FE) MG tablet Take 1 tablet (325 mg total) by mouth 2 (two) times daily. 10/30/17   [name], MD  fish oil-omega-3 fatty acids 1000 MG capsule Take 1 g by mouth daily.     [provider]  hydroxypropyl methylcellulose / hypromellose (ISOPTO TEARS / GONIOVISC) 2.5 % ophthalmic solution Place 1 drop into both eyes 4 (four) times daily as needed for dry eyes.     [provider]  indomethacin (INDOCIN) 50 MG capsule Take 1 capsule (50 mg total) by mouth 2 (two) times daily as needed (gout). Take  with food 12/24/19   [name], NP  Insulin Detemir (LEVEMIR FLEXTOUCH) 100 UNIT/ML Pen Inject 26 units daily 10/30/17   [name], [name], MD  insulin regular (NOVOLIN R RELION) 100 units/mL injection Inject 0.05-0.15 mLs (5-15 Units total) into the skin 3 (three) times daily with meals as needed for high blood sugar (per sliding scale). 10/30/17   [name], MD  Insulin Syringe-Needle U-100 (INSULIN SYRINGE .5CC/31GX5/16") 31G X 5/16" 0.5 ML MISC Use one to inject insulin daily 05/02/15   [name], MD  latanoprost (XALATAN) 0.005 % ophthalmic solution Place 1 drop into both eyes at bedtime.  02/17/14    [provider]  liraglutide (VICTOZA) 18 MG/3ML SOPN Inject 0.2 mLs (1.2 mg total) into the skin daily. 10/30/17   [name], MD  meclizine (ANTIVERT) 25 MG tablet Take 1 tablet (25 mg total) by mouth 3 (three) times daily as needed for dizziness. 0/10/93   [name], MD  metFORMIN (GLUCOPHAGE) 1000 MG tablet Take 1 tablet (1,000 mg total) by mouth 2 (two) times daily with a meal. 10/30/17   [name], [name], MD  metoprolol tartrate (LOPRESSOR) 25 MG tablet Take 1 tablet (25 mg total) by mouth 2 (two) times daily. 10/30/17   [name], MD  PREDNISOLONE ACETATE OP Place 1 drop into the right eye daily.    [provider]  simvastatin (ZOCOR) 20 MG tablet Take 1 tablet (20 mg total) by mouth daily at 6 PM. 10/30/17   [name], [name], MD  tamsulosin (FLOMAX) 0.4 MG CAPS capsule Take 1 capsule (0.4 mg total) by mouth at bedtime. 10/30/17   [name], MD    Family History Family History  Problem Relation Age of Onset  . Hypertension Mother   . Diabetes Mellitus I Mother   . Hypertension Father   . Coronary artery disease Father   . Hypertension Sister   . Hypertension Brother   . Coronary artery disease Brother   . Diabetes Mellitus I Brother   . Diabetes Mellitus I Brother   . Cancer Neg Hx     Social History Social History   Tobacco Use  . Smoking status: Former Smoker    Packs/day: 0.50    Types: Cigarettes    Quit date: 04/21/2005    Years since quitting: 14.6  . Smokeless tobacco: Never Used  Substance Use Topics  . Alcohol use: No  . Drug use: No     Allergies   Ace inhibitors and Allopurinol   Review of Systems Review of Systems   Physical Exam Triage Vital Signs ED Triage Vitals  Enc Vitals Group     BP 12/24/19 1523 (!) 163/75     Pulse Rate 12/24/19 1523 76     Resp 12/24/19 1523 18     Temp 12/24/19 1523 98.2 F (36.8 C)     Temp Source 12/24/19 1523 Oral     SpO2 12/24/19 1523 93 %     Weight --      Height --        Head Circumference --      Peak Flow --      Pain Score 12/24/19 1617 8     Pain Loc --      Pain Edu? --      Excl. in [place]? --    No data found.  Updated Vital Signs BP (!) 163/75 (BP Location: Left Arm)   Pulse 76   Temp 98.2 F (36.8 C) (Oral)   Resp  18   SpO2 93%   Visual Acuity Right Eye Distance:   Left Eye Distance:   Bilateral Distance:    Right Eye Near:   Left Eye Near:    Bilateral Near:     Physical Exam Vitals and nursing note reviewed.  Constitutional:      Appearance: Normal appearance.  HENT:     Head: Normocephalic and atraumatic.     Nose: Nose normal.  Eyes:     Conjunctiva/sclera: Conjunctivae normal.  Pulmonary:     Effort: Pulmonary effort is normal.  Musculoskeletal:        General: Normal range of motion.     Cervical back: Normal range of motion.       Feet:  Feet:     Comments: Swelling, erythema, tenderness. No open wounds, sores. 2+ pedal pulse.  Skin:    General: Skin is warm and dry.  Neurological:     Mental Status: He is alert.  Psychiatric:        Mood and Affect: Mood normal.      UC Treatments / Results  Labs (all labs ordered are listed, but only abnormal results are displayed) Labs Reviewed - No data to display  EKG   Radiology No results found.  Procedures Procedures (including critical care time)  Medications Ordered in UC Medications - No data to display  Initial Impression / Assessment and Plan / UC Course  I have reviewed the triage vital signs and the nursing notes.  Pertinent labs & imaging results that were available during my care of the patient were reviewed by me and considered in my medical decision making (see chart for details).     Gout Treating with indomethacin  He has had this before in the past and tolerated well. Recommended follow-up with his doctor as needed  Final Clinical Impressions(s) / UC Diagnoses   Final diagnoses:  Acute gout of left foot, unspecified cause      Discharge Instructions     Treating you for gout Medication as prescribed. Follow up as needed for continued or worsening symptoms'    ED Prescriptions    Medication Sig Dispense Auth. Provider   indomethacin (INDOCIN) 50 MG capsule Take 1 capsule (50 mg total) by mouth 2 (two) times daily as needed (gout). Take with food 20 capsule Rozanna Box, Courtany Mcmurphy A, NP     PDMP not reviewed this encounter.   [name], NP 12/24/19 1622

## 2019-12-24 NOTE — Discharge Instructions (Signed)
Treating you for gout Medication as prescribed. Follow up as needed for continued or worsening symptoms'

## 2020-01-10 DIAGNOSIS — C61 Malignant neoplasm of prostate: Secondary | ICD-10-CM | POA: Diagnosis not present

## 2020-01-17 ENCOUNTER — Encounter: Payer: Self-pay | Admitting: Cardiology

## 2020-01-17 ENCOUNTER — Ambulatory Visit: Payer: Medicare HMO | Admitting: Cardiology

## 2020-01-17 ENCOUNTER — Other Ambulatory Visit: Payer: Self-pay

## 2020-01-17 VITALS — BP 140/70 | HR 80 | Ht 72.0 in | Wt 181.0 lb

## 2020-01-17 DIAGNOSIS — T783XXS Angioneurotic edema, sequela: Secondary | ICD-10-CM | POA: Diagnosis not present

## 2020-01-17 DIAGNOSIS — E7800 Pure hypercholesterolemia, unspecified: Secondary | ICD-10-CM | POA: Diagnosis not present

## 2020-01-17 DIAGNOSIS — E119 Type 2 diabetes mellitus without complications: Secondary | ICD-10-CM

## 2020-01-17 DIAGNOSIS — I10 Essential (primary) hypertension: Secondary | ICD-10-CM

## 2020-01-17 NOTE — Progress Notes (Signed)
Cardiology Office Note:    Date:  01/17/2020   ID:  [name], DOB 11/14/1940, MRN [id number]  PCP:  [name], MD  [facility] Cardiologist:  Candee Furbish, MD  [facility] Electrophysiologist:  None   Referring MD: [name], MD     History of Present Illness:    [name] is a 79 y.o. male here for follow-up of prior myocardial infarction on his medical records.  Does not recall having cardiac catheterization or stent placement.  Echo in 2015 showed normal EF with no infarction.  Former patient of Dr. Producer, television/film/video.  Has comorbidities of diabetes hypertension chronic kidney disease stage II and iron deficiency anemia.  Left leg edema resolved.  He states that he had it a few weeks ago.  No edema today.  He was tell me about his wedding anniversary.  Over 50 years.  Also had angioedema with ACE inhibitor.  No bleeding issues tolerating his medications.  No fevers chills nausea vomiting syncope.  Past Medical History:  Diagnosis Date  . Diabetes mellitus    insulin dependent  . Heart disease, unspecified   . Hypercholesterolemia   . Hypertension   . Sarcoidosis   . Unspecified glaucoma(365.9)     Past Surgical History:  Procedure Laterality Date  . CATARACT EXTRACTION    . knee replacement surgery     right    Current Medications: Current Meds  Medication Sig  . ACCU-CHEK AVIVA PLUS test strip USE AS INSTRUCTED TO CHECK BLOOD SUGAR THREE TIMES DAILY  . acetaminophen (TYLENOL) 325 MG tablet Take 2 tablets (650 mg total) by mouth every 6 (six) hours as needed for mild pain, fever or headache.  Marland Kitchen amLODipine (NORVASC) 10 MG tablet Take 1 tablet (10 mg total) by mouth daily.  Marland Kitchen aspirin 81 MG tablet Take 81 mg by mouth at bedtime.   Marland Kitchen atorvastatin (LIPITOR) 20 MG tablet Take 20 mg by mouth daily.  . Blood Glucose Monitoring Suppl (ACCU-CHEK AVIVA PLUS) w/Device KIT Use to check blood sugar 3 times per day dx code E11.65  . donepezil (ARICEPT) 10 MG tablet  Take 1 tablet (10 mg total) by mouth at bedtime.  . dorzolamide-timolol (COSOPT) 22.3-6.8 MG/ML ophthalmic solution Place 1 drop into both eyes 2 (two) times daily.   Marland Kitchen escitalopram (LEXAPRO) 5 MG tablet Take 1 tablet (5 mg total) by mouth at bedtime.  . ferrous sulfate 325 (65 FE) MG tablet Take 1 tablet (325 mg total) by mouth 2 (two) times daily.  . fish oil-omega-3 fatty acids 1000 MG capsule Take 1 g by mouth daily.   . hydroxypropyl methylcellulose / hypromellose (ISOPTO TEARS / GONIOVISC) 2.5 % ophthalmic solution Place 1 drop into both eyes 4 (four) times daily as needed for dry eyes.   . indomethacin (INDOCIN) 50 MG capsule Take 1 capsule (50 mg total) by mouth 2 (two) times daily as needed (gout). Take with food  . Insulin Detemir (LEVEMIR FLEXTOUCH) 100 UNIT/ML Pen Inject 26 units daily  . insulin regular (NOVOLIN R RELION) 100 units/mL injection Inject 0.05-0.15 mLs (5-15 Units total) into the skin 3 (three) times daily with meals as needed for high blood sugar (per sliding scale).  . Insulin Syringe-Needle U-100 (INSULIN SYRINGE .5CC/31GX5/16") 31G X 5/16" 0.5 ML MISC Use one to inject insulin daily  . latanoprost (XALATAN) 0.005 % ophthalmic solution Place 1 drop into both eyes at bedtime.   . liraglutide (VICTOZA) 18 MG/3ML SOPN Inject 0.2 mLs (1.2 mg total) into the  skin daily.  . meclizine (ANTIVERT) 25 MG tablet Take 1 tablet (25 mg total) by mouth 3 (three) times daily as needed for dizziness.  . metFORMIN (GLUCOPHAGE) 1000 MG tablet Take 1 tablet (1,000 mg total) by mouth 2 (two) times daily with a meal.  . metoprolol tartrate (LOPRESSOR) 25 MG tablet Take 1 tablet (25 mg total) by mouth 2 (two) times daily.  Marland Kitchen PREDNISOLONE ACETATE OP Place 1 drop into the right eye daily.  . tamsulosin (FLOMAX) 0.4 MG CAPS capsule Take 1 capsule (0.4 mg total) by mouth at bedtime.     Allergies:   Ace inhibitors and Allopurinol   Social History   Socioeconomic History  . Marital status:  Married    Spouse name: Not on file  . Number of children: Not on file  . Years of education: Not on file  . Highest education level: Not on file  Occupational History  . Not on file  Tobacco Use  . Smoking status: Former Smoker    Packs/day: 0.50    Types: Cigarettes    Quit date: 04/21/2005    Years since quitting: 14.7  . Smokeless tobacco: Never Used  Substance and Sexual Activity  . Alcohol use: No  . Drug use: No  . Sexual activity: Not Currently  Other Topics Concern  . Not on file  Social History Narrative  . Not on file   Social Determinants of Health   Financial Resource Strain:   . Difficulty of Paying Living Expenses: Not on file  Food Insecurity: No Food Insecurity  . Worried About Charity fundraiser in the Last Year: Never true  . Ran Out of Food in the Last Year: Never true  Transportation Needs: No Transportation Needs  . Lack of Transportation (Medical): No  . Lack of Transportation (Non-Medical): No  Physical Activity:   . Days of Exercise per Week: Not on file  . Minutes of Exercise per Session: Not on file  Stress:   . Feeling of Stress : Not on file  Social Connections:   . Frequency of Communication with Friends and Family: Not on file  . Frequency of Social Gatherings with Friends and Family: Not on file  . Attends Religious Services: Not on file  . Active Member of Clubs or Organizations: Not on file  . Attends Archivist Meetings: Not on file  . Marital Status: Not on file     Family History: The patient's family history includes Coronary artery disease in his brother and father; Diabetes Mellitus I in his brother, brother, and mother; Hypertension in his brother, father, mother, and sister. There is no history of Cancer.  ROS:   Please see the history of present illness.     All other systems reviewed and are negative.  EKGs/Labs/Other Studies Reviewed:    The following studies were reviewed today: Prior medical  records  EKG:  EKG is  ordered today.  The ekg ordered today demonstrates sinus rhythm 80 with PACs  Recent Labs: No results found for requested labs within last 8760 hours.  Recent Lipid Panel    Component Value Date/Time   CHOL 137 09/22/2016 0934   TRIG 89.0 09/22/2016 0934   HDL 37.30 (L) 09/22/2016 0934   CHOLHDL 4 09/22/2016 0934   VLDL 17.8 09/22/2016 0934   LDLCALC 81 09/22/2016 0934    Physical Exam:    VS:  BP 140/70   Pulse 80   Ht 6' (1.829 m)   [facility]  181 lb (82.1 kg)   SpO2 93%   BMI 24.55 kg/m     Wt Readings from Last 3 Encounters:  01/17/20 181 lb (82.1 kg)  01/04/19 183 lb (83 kg)  12/15/17 174 lb 9.6 oz (79.2 kg)     GEN:  Well nourished, well developed in no acute distress HEENT: Normal NECK: No JVD; No carotid bruits LYMPHATICS: No lymphadenopathy CARDIAC: RRR, no murmurs, rubs, gallops RESPIRATORY:  Clear to auscultation without rales, wheezing or rhonchi  ABDOMEN: Soft, non-tender, non-distended MUSCULOSKELETAL:  No edema; No deformity  SKIN: Warm and dry NEUROLOGIC:  Alert and oriented x 3 PSYCHIATRIC:  Normal affect   ASSESSMENT:    1. Pure hypercholesterolemia   2. Diabetes mellitus with coincident hypertension ([place])   3. Angioedema, sequela   4. Essential hypertension    PLAN:    In order of problems listed above:  Angioedema -Do not use ACE inhibitor's. -Stable currently.  No further occurrences.  Coronary artery disease has been taken off of his past medical history.  Essential hypertension -Well-controlled.  Doing well on current medications.  Hyperlipidemia -Continue with statin therapy.  I do believe that he is on atorvastatin 20 mg.  Simvastatin was on his list as well.  We will go ahead and take this off.  Continue to prescribe atorvastatin.  Diabetes with chronic kidney disease stage II -Doing well.  Hemoglobin A1c 8.8.  Could be improved.  Last LDL 61 ALT 14.   Medication Adjustments/Labs and Tests  Ordered: Current medicines are reviewed at length with the patient today.  Concerns regarding medicines are outlined above.  Orders Placed This Encounter  Procedures  . EKG 12-Lead   No orders of the defined types were placed in this encounter.   Patient Instructions  Medication Instructions:  The current medical regimen is effective;  continue present plan and medications.  *If you need a refill on your cardiac medications before your next appointment, please call your pharmacy*  Follow-Up: At [facility], you and your health needs are our priority.  As part of our continuing mission to provide you with exceptional heart care, we have created designated Provider Care Teams.  These Care Teams include your primary Cardiologist (physician) and Advanced Practice Providers (APPs -  Physician Assistants and Nurse Practitioners) who all work together to provide you with the care you need, when you need it.  We recommend signing up for the patient portal called "MyChart".  Sign up information is provided on this After Visit Summary.  MyChart is used to connect with patients for Virtual Visits (Telemedicine).  Patients are able to view lab/test results, encounter notes, upcoming appointments, etc.  Non-urgent messages can be sent to your provider as well.   To learn more about what you can do with MyChart, go to [email or web address].    Your next appointment:   12 month(s)  The format for your next appointment:   In Person  Provider:   Candee Furbish, MD   Thank you for choosing [facility]!!        Signed, Candee Furbish, MD  01/17/2020 9:38 AM    [place]

## 2020-01-17 NOTE — Patient Instructions (Signed)
Medication Instructions:  The current medical regimen is effective;  continue present plan and medications.  *If you need a refill on your cardiac medications before your next appointment, please call your pharmacy*  Follow-Up: At [facility], you and your health needs are our priority.  As part of our continuing mission to provide you with exceptional heart care, we have created designated Provider Care Teams.  These Care Teams include your primary Cardiologist (physician) and Advanced Practice Providers (APPs -  Physician Assistants and Nurse Practitioners) who all work together to provide you with the care you need, when you need it.  We recommend signing up for the patient portal called "MyChart".  Sign up information is provided on this After Visit Summary.  MyChart is used to connect with patients for Virtual Visits (Telemedicine).  Patients are able to view lab/test results, encounter notes, upcoming appointments, etc.  Non-urgent messages can be sent to your provider as well.   To learn more about what you can do with MyChart, go to [email or web address].    Your next appointment:   12 month(s)  The format for your next appointment:   In Person  Provider:   Mark Skains, MD   Thank you for choosing  [facility]!!      

## 2020-01-19 DIAGNOSIS — C61 Malignant neoplasm of prostate: Secondary | ICD-10-CM | POA: Diagnosis not present

## 2020-01-19 DIAGNOSIS — R3915 Urgency of urination: Secondary | ICD-10-CM | POA: Diagnosis not present

## 2020-01-19 DIAGNOSIS — C678 Malignant neoplasm of overlapping sites of bladder: Secondary | ICD-10-CM | POA: Diagnosis not present

## 2020-01-25 DIAGNOSIS — Z23 Encounter for immunization: Secondary | ICD-10-CM | POA: Diagnosis not present

## 2020-01-31 ENCOUNTER — Other Ambulatory Visit: Payer: Self-pay

## 2020-01-31 DIAGNOSIS — H401123 Primary open-angle glaucoma, left eye, severe stage: Secondary | ICD-10-CM | POA: Diagnosis not present

## 2020-01-31 DIAGNOSIS — Z794 Long term (current) use of insulin: Secondary | ICD-10-CM | POA: Diagnosis not present

## 2020-01-31 DIAGNOSIS — H401112 Primary open-angle glaucoma, right eye, moderate stage: Secondary | ICD-10-CM | POA: Diagnosis not present

## 2020-01-31 DIAGNOSIS — Z961 Presence of intraocular lens: Secondary | ICD-10-CM | POA: Diagnosis not present

## 2020-01-31 DIAGNOSIS — E119 Type 2 diabetes mellitus without complications: Secondary | ICD-10-CM | POA: Diagnosis not present

## 2020-01-31 NOTE — Patient Outreach (Signed)
[facility]) Care Management  01/31/2020  [name] 1940-12-30 [id number]   Telephone Assessment    Unsuccessful outreach attempt to patient.     Plan:  RN CM will make quarterly outreach attempt to patient within the month of  Nov if no return call from patient.   Enzo Montgomery, RN,BSN,CCM [place] Management Telephonic Care Management Coordinator Direct Phone: [phone] Toll Free: [phone] Fax: [phone]

## 2020-02-07 DIAGNOSIS — N189 Chronic kidney disease, unspecified: Secondary | ICD-10-CM | POA: Diagnosis not present

## 2020-02-07 DIAGNOSIS — I10 Essential (primary) hypertension: Secondary | ICD-10-CM | POA: Diagnosis not present

## 2020-02-07 DIAGNOSIS — E7800 Pure hypercholesterolemia, unspecified: Secondary | ICD-10-CM | POA: Diagnosis not present

## 2020-02-07 DIAGNOSIS — E1165 Type 2 diabetes mellitus with hyperglycemia: Secondary | ICD-10-CM | POA: Diagnosis not present

## 2020-02-20 DIAGNOSIS — C61 Malignant neoplasm of prostate: Secondary | ICD-10-CM | POA: Diagnosis not present

## 2020-02-20 DIAGNOSIS — N401 Enlarged prostate with lower urinary tract symptoms: Secondary | ICD-10-CM | POA: Diagnosis not present

## 2020-02-20 DIAGNOSIS — D090 Carcinoma in situ of bladder: Secondary | ICD-10-CM | POA: Diagnosis not present

## 2020-02-20 DIAGNOSIS — R3915 Urgency of urination: Secondary | ICD-10-CM | POA: Diagnosis not present

## 2020-02-29 ENCOUNTER — Other Ambulatory Visit: Payer: Self-pay

## 2020-02-29 NOTE — Patient Outreach (Signed)
[facility]) Care Management  02/29/2020  [name] September 20, 1940 [id number]   Telephone Assessment  Outreach attempt to patient/spouse. Spoke with both of them but primarily spouse as patient remains HOH. He dens any acute issues or concerns at present. He voices that he has bene feeling and doing well. Spouse report that he got flu vaccine at last MD appt and they both went this appt weekend to get COVID-19 booster dose. She reports that patient tolerated it well and has had no complaints. He continues to have a good appetite and eating 2-3x/day. Blood sugar this morning reported as 103. Last A1C on file is 7.6(Oct 2021). No recent falls. Spouse voices that patient has bene able to ambulate unassisted. He spends most of the day siting in chair or bed and watching TV. She denies any RN CM needs or concerns at this time.   Medications Reviewed Today    Reviewed by [name], RN (Registered Nurse) on 02/29/20 at [place] List Status: <None>  Medication Order Taking? Sig Documenting Provider Last Dose Status Informant  ACCU-CHEK AVIVA PLUS test strip [id number] No USE AS INSTRUCTED TO CHECK BLOOD SUGAR THREE TIMES DAILY [name], MD Taking Active Family Member  acetaminophen (TYLENOL) 325 MG tablet [id number] No Take 2 tablets (650 mg total) by mouth every 6 (six) hours as needed for mild pain, fever or headache. [name], MD Taking Active   amLODipine (NORVASC) 10 MG tablet [id number] No Take 1 tablet (10 mg total) by mouth daily. [name], MD Taking Active   aspirin 81 MG tablet [id number] No Take 81 mg by mouth at bedtime.  [provider] Taking Active Family Member  atorvastatin (LIPITOR) 20 MG tablet [id number] No Take 20 mg by mouth daily. [provider] Taking Active   Blood Glucose Monitoring Suppl (ACCU-CHEK AVIVA PLUS) w/Device KIT [id number] No Use to check blood sugar 3 times per day dx code E11.65 [name], MD  Taking Active Family Member  donepezil (ARICEPT) 10 MG tablet [id number] No Take 1 tablet (10 mg total) by mouth at bedtime. [name], MD Taking Active   dorzolamide-timolol (COSOPT) 22.3-6.8 MG/ML ophthalmic solution [id number] No Place 1 drop into both eyes 2 (two) times daily.  [provider] Taking Active Family Member  escitalopram (LEXAPRO) 5 MG tablet [id number] No Take 1 tablet (5 mg total) by mouth at bedtime. [name], MD Taking Active   ferrous sulfate 325 (65 FE) MG tablet [id number] No Take 1 tablet (325 mg total) by mouth 2 (two) times daily. [name], MD Taking Active   fish oil-omega-3 fatty acids 1000 MG capsule [id number] No Take 1 g by mouth daily.  [provider] Taking Active Family Member  hydroxypropyl methylcellulose / hypromellose (ISOPTO TEARS / GONIOVISC) 2.5 % ophthalmic solution [id number] No Place 1 drop into both eyes 4 (four) times daily as needed for dry eyes.  [provider] Taking Active Family Member  indomethacin (INDOCIN) 50 MG capsule [id number] No Take 1 capsule (50 mg total) by mouth 2 (two) times daily as needed (gout). Take with food [name], NP Taking Active   Insulin Detemir (LEVEMIR FLEXTOUCH) 100 UNIT/ML Pen [id number] No Inject 26 units daily [name], [name], MD Taking Active   insulin regular (NOVOLIN R RELION) 100 units/mL injection [id number] No Inject 0.05-0.15 mLs (5-15 Units total) into the skin 3 (three) times daily with meals as needed for high blood sugar (per sliding scale). [name], MD Taking Active  Insulin Syringe-Needle U-100 (INSULIN SYRINGE .5CC/31GX5/16") 31G X 5/16" 0.5 ML MISC [id number] No Use one to inject insulin daily [name], MD Taking Active Family Member  latanoprost (XALATAN) 0.005 % ophthalmic solution [id number] No Place 1 drop into both eyes at bedtime.  [provider] Taking Active Family Member           Med Note (HARDEN, SUMMER D   Mon Oct 26, 2017   5:16 PM)    liraglutide (VICTOZA) 18 MG/3ML SOPN [id number] No Inject 0.2 mLs (1.2 mg total) into the skin daily. [name], MD Taking Active   meclizine (ANTIVERT) 25 MG tablet [id number] No Take 1 tablet (25 mg total) by mouth 3 (three) times daily as needed for dizziness. [name], MD Taking Active Family Member  metFORMIN (GLUCOPHAGE) 1000 MG tablet [id number] No Take 1 tablet (1,000 mg total) by mouth 2 (two) times daily with a meal. [name], MD Taking Active            Med Note [name], [name]   Wed Nov 18, 2017  2:07 PM)    metoprolol tartrate (LOPRESSOR) 25 MG tablet [id number] No Take 1 tablet (25 mg total) by mouth 2 (two) times daily. [name], MD Taking Active   PREDNISOLONE ACETATE OP [id number] No Place 1 drop into the right eye daily. [provider] Taking Active Family Member           Med Note [facility] MENDEZ, CARLOS A   Tue Jan 04, 2019  8:29 AM)    tamsulosin (FLOMAX) 0.4 MG CAPS capsule [id number] No Take 1 capsule (0.4 mg total) by mouth at bedtime. [name], MD Taking Active           Goals Addressed            This Visit's Progress   . Monitor and Manage My Blood Sugar       Follow Up Date *Feb 2022   - check blood sugar at prescribed times - check blood sugar if I feel it is too high or too low - enter blood sugar readings and medication or insulin into daily log - take the blood sugar meter to all doctor visits    Why is this important?   Checking your blood sugar at home helps to keep it from getting very high or very low.  Writing the results in a diary or log helps the doctor know how to care for you.  Your blood sugar log should have the time, date and the results.  Also, write down the amount of insulin or other medicine that you take.  Other information, like what you ate, exercise done and how you were feeling, will also be helpful.     Notes:     . Set My Target A1C       Follow Up Date Feb 2022   -  set target A1C(less than 7.0)    Why is this important?   Your target A1C is decided together by you and your doctor.  It is based on several things like your age and other health issues.    Notes:        Plan:  RN CM will discussed with spouse next outreach within the month of Feb . Spouse gave verbal consent and in agreement with RN CM follow up and timeframe. She is aware that they may contact RN CM sooner for any issues or concerns. RN CM will send quarterly update to PCP.  Enzo Montgomery, RN,BSN,CCM [place] Management Telephonic Care Management Coordinator Direct Phone: [phone] Toll Free: [phone] Fax: [phone]

## 2020-03-02 ENCOUNTER — Ambulatory Visit: Payer: Self-pay

## 2020-03-27 DIAGNOSIS — R3915 Urgency of urination: Secondary | ICD-10-CM | POA: Diagnosis not present

## 2020-04-03 DIAGNOSIS — R3915 Urgency of urination: Secondary | ICD-10-CM | POA: Diagnosis not present

## 2020-04-19 DIAGNOSIS — R3915 Urgency of urination: Secondary | ICD-10-CM | POA: Diagnosis not present

## 2020-04-24 DIAGNOSIS — R3915 Urgency of urination: Secondary | ICD-10-CM | POA: Diagnosis not present

## 2020-05-01 DIAGNOSIS — R3915 Urgency of urination: Secondary | ICD-10-CM | POA: Diagnosis not present

## 2020-05-10 DIAGNOSIS — R351 Nocturia: Secondary | ICD-10-CM | POA: Diagnosis not present

## 2020-05-10 DIAGNOSIS — R3915 Urgency of urination: Secondary | ICD-10-CM | POA: Diagnosis not present

## 2020-05-15 DIAGNOSIS — R3915 Urgency of urination: Secondary | ICD-10-CM | POA: Diagnosis not present

## 2020-05-22 ENCOUNTER — Other Ambulatory Visit: Payer: Self-pay

## 2020-05-22 ENCOUNTER — Ambulatory Visit (INDEPENDENT_AMBULATORY_CARE_PROVIDER_SITE_OTHER): Payer: Medicare HMO

## 2020-05-22 ENCOUNTER — Ambulatory Visit (HOSPITAL_COMMUNITY)
Admission: EM | Admit: 2020-05-22 | Discharge: 2020-05-22 | Disposition: A | Discharge status: Home / Self Care | Payer: Medicare HMO | Attending: Emergency Medicine | Admitting: Emergency Medicine

## 2020-05-22 ENCOUNTER — Encounter (HOSPITAL_COMMUNITY): Payer: Self-pay

## 2020-05-22 ENCOUNTER — Telehealth (HOSPITAL_COMMUNITY): Payer: Self-pay | Admitting: Adult Health

## 2020-05-22 DIAGNOSIS — U071 COVID-19: Secondary | ICD-10-CM | POA: Diagnosis not present

## 2020-05-22 DIAGNOSIS — Z20822 Contact with and (suspected) exposure to covid-19: Secondary | ICD-10-CM | POA: Insufficient documentation

## 2020-05-22 DIAGNOSIS — J1282 Pneumonia due to coronavirus disease 2019: Secondary | ICD-10-CM | POA: Diagnosis not present

## 2020-05-22 DIAGNOSIS — R092 Respiratory arrest: Secondary | ICD-10-CM | POA: Diagnosis not present

## 2020-05-22 DIAGNOSIS — D869 Sarcoidosis, unspecified: Secondary | ICD-10-CM | POA: Diagnosis not present

## 2020-05-22 DIAGNOSIS — Z8709 Personal history of other diseases of the respiratory system: Secondary | ICD-10-CM

## 2020-05-22 DIAGNOSIS — R059 Cough, unspecified: Secondary | ICD-10-CM | POA: Insufficient documentation

## 2020-05-22 DIAGNOSIS — R0902 Hypoxemia: Secondary | ICD-10-CM

## 2020-05-22 DIAGNOSIS — J189 Pneumonia, unspecified organism: Secondary | ICD-10-CM | POA: Diagnosis not present

## 2020-05-22 DIAGNOSIS — J811 Chronic pulmonary edema: Secondary | ICD-10-CM | POA: Diagnosis not present

## 2020-05-22 LAB — SARS CORONAVIRUS 2 (TAT 6-24 HRS): SARS Coronavirus 2: POSITIVE — AB

## 2020-05-22 MED ORDER — AEROCHAMBER PLUS MISC: 2 refills | Status: DC

## 2020-05-22 MED ORDER — ALBUTEROL SULFATE HFA 108 (90 BASE) MCG/ACT IN AERS: 1.0000 | INHALATION_SPRAY | RESPIRATORY_TRACT | 0 refills | Status: DC | PRN

## 2020-05-22 NOTE — ED Provider Notes (Addendum)
HPI  SUBJECTIVE:  [name] is a 80 y.o. male who presents with oxygen saturation to 84% noted last night.  His baseline is above 90%.  He had a positive home Covid test 2 days ago.  He reports headache, diffuse chest pain with coughing only starting 5 days ago.  He denies chest pain in any other time.  No pleuritic pain, chest pressure or heaviness.  He is unable to characterize the pain.  No fevers, body aches, shortness of breath, wheezing, loss of sense of smell or taste, nausea, vomiting, diarrhea.  No antipyretic in the past 6 hours.  No antibiotics in the past 3 months.  He is not on any home oxygen.  No leg pain, swelling, hemoptysis, surgery in the past 4 weeks, recent immobilization.  He got [employer] booster on 02/25/2020.  He has a past medical history of diabetes, remote history of prostate cancer, hypertension, sarcoidosis, hypercholesterolemia, coronary artery disease.  No history of PE, DVT, pulmonary disease, smoking.  JME:QASTM, Caren Griffins, MD   Past Medical History:  Diagnosis Date  . Diabetes mellitus    insulin dependent  . Heart disease, unspecified   . Hypercholesterolemia   . Hypertension   . Sarcoidosis   . Unspecified glaucoma(365.9)     Past Surgical History:  Procedure Laterality Date  . CATARACT EXTRACTION    . knee replacement surgery     right    Family History  Problem Relation Age of Onset  . Hypertension Mother   . Diabetes Mellitus I Mother   . Hypertension Father   . Coronary artery disease Father   . Hypertension Sister   . Hypertension Brother   . Coronary artery disease Brother   . Diabetes Mellitus I Brother   . Diabetes Mellitus I Brother   . Cancer Neg Hx     Social History   Tobacco Use  . Smoking status: Former Smoker    Packs/day: 0.50    Types: Cigarettes    Quit date: 04/21/2005    Years since quitting: 15.0  . Smokeless tobacco: Never Used  Substance Use Topics  . Alcohol use: No  . Drug use: No    No current  facility-administered medications for this encounter.  Current Outpatient Medications:  .  albuterol (VENTOLIN HFA) 108 (90 Base) MCG/ACT inhaler, Inhale 1-2 puffs into the lungs every 4 (four) hours as needed for wheezing or shortness of breath., Disp: 1 each, Rfl: 0 .  Spacer/Aero-Holding Chambers (AEROCHAMBER PLUS) inhaler, Use with inhaler, Disp: 1 each, Rfl: 2 .  ACCU-CHEK AVIVA PLUS test strip, USE AS INSTRUCTED TO CHECK BLOOD SUGAR THREE TIMES DAILY, Disp: 300 each, Rfl: 1 .  acetaminophen (TYLENOL) 325 MG tablet, Take 2 tablets (650 mg total) by mouth every 6 (six) hours as needed for mild pain, fever or headache., Disp: 20 tablet, Rfl: 1 .  amLODipine (NORVASC) 10 MG tablet, Take 1 tablet (10 mg total) by mouth daily., Disp: 30 tablet, Rfl: 1 .  aspirin 81 MG tablet, Take 81 mg by mouth at bedtime. , Disp: , Rfl:  .  atorvastatin (LIPITOR) 20 MG tablet, Take 20 mg by mouth daily., Disp: , Rfl:  .  Blood Glucose Monitoring Suppl (ACCU-CHEK AVIVA PLUS) w/Device KIT, Use to check blood sugar 3 times per day dx code E11.65, Disp: 1 kit, Rfl: 0 .  donepezil (ARICEPT) 10 MG tablet, Take 1 tablet (10 mg total) by mouth at bedtime., Disp: 30 tablet, Rfl: 1 .  dorzolamide-timolol (COSOPT) 22.3-6.8  MG/ML ophthalmic solution, Place 1 drop into both eyes 2 (two) times daily. , Disp: , Rfl:  .  escitalopram (LEXAPRO) 5 MG tablet, Take 1 tablet (5 mg total) by mouth at bedtime., Disp: 30 tablet, Rfl: 1 .  ferrous sulfate 325 (65 FE) MG tablet, Take 1 tablet (325 mg total) by mouth 2 (two) times daily., Disp: 60 tablet, Rfl: 3 .  fish oil-omega-3 fatty acids 1000 MG capsule, Take 1 g by mouth daily. , Disp: , Rfl:  .  hydroxypropyl methylcellulose / hypromellose (ISOPTO TEARS / GONIOVISC) 2.5 % ophthalmic solution, Place 1 drop into both eyes 4 (four) times daily as needed for dry eyes. , Disp: , Rfl:  .  indomethacin (INDOCIN) 50 MG capsule, Take 1 capsule (50 mg total) by mouth 2 (two) times daily as  needed (gout). Take with food, Disp: 20 capsule, Rfl: 1 .  Insulin Detemir (LEVEMIR FLEXTOUCH) 100 UNIT/ML Pen, Inject 26 units daily, Disp: 20 pen, Rfl: 1 .  insulin regular (NOVOLIN R RELION) 100 units/mL injection, Inject 0.05-0.15 mLs (5-15 Units total) into the skin 3 (three) times daily with meals as needed for high blood sugar (per sliding scale)., Disp: 10 mL, Rfl: 2 .  Insulin Syringe-Needle U-100 (INSULIN SYRINGE .5CC/31GX5/16") 31G X 5/16" 0.5 ML MISC, Use one to inject insulin daily, Disp: 30 each, Rfl: 1 .  latanoprost (XALATAN) 0.005 % ophthalmic solution, Place 1 drop into both eyes at bedtime. , Disp: , Rfl:  .  liraglutide (VICTOZA) 18 MG/3ML SOPN, Inject 0.2 mLs (1.2 mg total) into the skin daily., Disp: 15 pen, Rfl: 1 .  meclizine (ANTIVERT) 25 MG tablet, Take 1 tablet (25 mg total) by mouth 3 (three) times daily as needed for dizziness., Disp: 30 tablet, Rfl: 0 .  metFORMIN (GLUCOPHAGE) 1000 MG tablet, Take 1 tablet (1,000 mg total) by mouth 2 (two) times daily with a meal., Disp: 60 tablet, Rfl: 2 .  metoprolol tartrate (LOPRESSOR) 25 MG tablet, Take 1 tablet (25 mg total) by mouth 2 (two) times daily., Disp: 60 tablet, Rfl: 2 .  PREDNISOLONE ACETATE OP, Place 1 drop into the right eye daily., Disp: , Rfl:  .  tamsulosin (FLOMAX) 0.4 MG CAPS capsule, Take 1 capsule (0.4 mg total) by mouth at bedtime., Disp: 30 capsule, Rfl: 1  Allergies  Allergen Reactions  . Ace Inhibitors Other (See Comments)    angioedema  . Allopurinol Rash     ROS  As noted in HPI.   Physical Exam  BP 136/61 (BP Location: Right Arm)   Pulse 79   Temp 98.3 F (36.8 C) (Oral)   Resp (!) 25   SpO2 94%   Constitutional: Well developed, well nourished, no acute distress Eyes:  EOMI, conjunctiva normal bilaterally HENT: Normocephalic, atraumatic,mucus membranes moist Respiratory: Normal inspiratory effort, lungs clear bilaterally Cardiovascular: Normal rate regular rhythm no murmurs rubs or  gallop GI: nondistended skin: No rash, skin intact Musculoskeletal: Calves symmetric, nontender, no edema Neurologic: Alert & oriented x 3, no focal neuro deficits Psychiatric: Speech and behavior appropriate   ED Course   Medications - No data to display  Orders Placed This Encounter  Procedures  . SARS CORONAVIRUS 2 (TAT 6-24 HRS) Nasopharyngeal Nasopharyngeal Swab    Standing Status:   Standing    Number of Occurrences:   1    Order Specific Question:   Is this test for diagnosis or screening    Answer:   Diagnosis of ill patient  Order Specific Question:   Symptomatic for COVID-19 as defined by CDC    Answer:   Yes    Order Specific Question:   Date of Symptom Onset    Answer:   05/16/2020    Order Specific Question:   Hospitalized for COVID-19    Answer:   No    Order Specific Question:   Admitted to ICU for COVID-19    Answer:   No    Order Specific Question:   Previously tested for COVID-19    Answer:   No    Order Specific Question:   Resident in a congregate (group) care setting    Answer:   No    Order Specific Question:   Employed in healthcare setting    Answer:   No    Order Specific Question:   Has patient completed COVID vaccination(s) (2 doses of Pfizer/Moderna 1 dose of [employer])    Answer:   Yes  . DG Chest 2 View    Standing Status:   Standing    Number of Occurrences:   1    Order Specific Question:   Reason for Exam (SYMPTOM  OR DIAGNOSIS REQUIRED)    Answer:   + covid at home, hypoxic at home, h/o sarcoidsis r/o PNA    Results for orders placed or performed during the hospital encounter of 05/22/20 (from the past 24 hour(s))  SARS CORONAVIRUS 2 (TAT 6-24 HRS) Nasopharyngeal Nasopharyngeal Swab     Status: Abnormal   Collection Time: 05/22/20 10:32 AM   Specimen: Nasopharyngeal Swab  Result Value Ref Range   SARS Coronavirus 2 POSITIVE (A) NEGATIVE   DG Chest 2 View  Result Date: 05/22/2020 CLINICAL DATA:  COVID pneumonia, hypoxemia,  sarcoidosis EXAM: CHEST - 2 VIEW COMPARISON:  05/12/2019 FINDINGS: The lungs are symmetrically well expanded and are clear. No pneumothorax or pleural effusion. Cardiac size within normal limits. Central pulmonary vascular congestion without overt pulmonary edema is stable. No acute bone abnormality. IMPRESSION: No active cardiopulmonary disease. Electronically Signed   By: Fidela Salisbury MD   On: 05/22/2020 10:51    ED Clinical Impression  1. COVID-19 virus infection   2. Cough   3. Encounter for laboratory testing for COVID-19 virus      ED Assessment/Plan  Concern for Covid pneumonia.  Checking chest x-ray, will get Covid PCR test that is in our system.  Will refer to the [place] treatment team due to comorbidities if positive.   Doubt PE in the absence of tachycardia, pleuritic chest pain, physical evidence of DVT, hypoxia here.  while COVID is a hypercoagulable state, he has no other risk factors for PE.  States that the prostate cancer is resolved.  Deferring D-dimer.  Reviewed imaging independently.  Central pulmonary vascular congestion without overt pulmonary edema, stable.  No acute cardiopulmonary disease.  Radiology report for full details.  CXR neg for acute process.  Patient will likely COVID infection.  Will send home with albuterol inahler with spacer. Follow up with covid clinic. ED for oxygen saturation below 90% consistently, shortness of breath, other concerns.  COVID positive.  COVID treatment team already aware of patient, has attempted to contact him.  Discussed labs, imaging, MDM, treatment plan, and plan for follow-up with patient and wife. Discussed sn/sx that should prompt return to the ED. they agree with plan.   Meds ordered this encounter  Medications  . albuterol (VENTOLIN HFA) 108 (90 Base) MCG/ACT inhaler    Sig: Inhale  1-2 puffs into the lungs every 4 (four) hours as needed for wheezing or shortness of breath.    Dispense:  1 each    Refill:  0  .  Spacer/Aero-Holding Chambers (AEROCHAMBER PLUS) inhaler    Sig: Use with inhaler    Dispense:  1 each    Refill:  2    Please educate patient on use    *This clinic note was created using Dragon dictation software. Therefore, there may be occasional mistakes despite careful proofreading.   ?    [name], MD 05/23/20 [id number]    [name], MD 05/23/20 [phone]

## 2020-05-22 NOTE — ED Triage Notes (Signed)
Pt presents to check pulse ox as he states it was 89 last night. Per wife, pt is having cough and they checked the pulse ox last night and called EMS due to level on 89. Pt denies SOB, chest pain, headache, dizziness, weakness, nausea.

## 2020-05-22 NOTE — Discharge Instructions (Addendum)
He can do 2 puffs from your albuterol inhaler every 4-6 hours as needed.  This may help with the cough.  Keep an eye on your oxygen saturation.  Go immediately to the ER if it is persistently below 90%, if he starts having chest pain, pressure, heaviness, calf pain or swelling, coughing up blood, or any other concerns.  Follow-up with either your primary care physician or the [place] clinic ASAP.  Make sure you get out and walk some every day to keep your lungs open and to prevent a blood clot.

## 2020-05-22 NOTE — Telephone Encounter (Signed)
Called to discuss with patient about COVID-19 symptoms and the use of one of the available treatments for those with mild to moderate Covid symptoms and at a high risk of hospitalization.  Pt appears to qualify for outpatient treatment due to co-morbid conditions and/or a member of an at-risk group in accordance with the FDA Emergency Use Authorization.      Unable to reach pt - LMOM   Aubra Pappalardo C Orris Perin   

## 2020-05-29 ENCOUNTER — Other Ambulatory Visit: Payer: Self-pay

## 2020-05-29 NOTE — Patient Outreach (Signed)
[facility] [facility]) Care Management  05/29/2020  [name] 06-04-1940 [id number]   Telephone Assessment Quarterly Call   Outreach attempt to patient/spouse. Spoke with spouse. She shares that they are doing well despite having recently tested positive for COVID. She voice that they both were vaccinated and boosted. However, patient went to visit a neighbor recently and did not know that they had illness and contracted it from them. They are both in quarantine at present. She stets the only sx they have really had is runny nose and very mild cough. She reports that they are drinking plenty of fluids and staying hydrated. Patient continues to have a good appetite.   Medications Reviewed Today    Reviewed by [name], RN (Registered Nurse) on 05/29/20 at 1009  Med List Status: <None>  Medication Order Taking? Sig Documenting Provider Last Dose Status Informant  ACCU-CHEK AVIVA PLUS test strip [id number] No USE AS INSTRUCTED TO CHECK BLOOD SUGAR THREE TIMES DAILY [name], MD Taking Active Family Member  acetaminophen (TYLENOL) 325 MG tablet [id number] No Take 2 tablets (650 mg total) by mouth every 6 (six) hours as needed for mild pain, fever or headache. [name], MD Taking Active   albuterol (VENTOLIN HFA) 108 (90 Base) MCG/ACT inhaler [id number]  Inhale 1-2 puffs into the lungs every 4 (four) hours as needed for wheezing or shortness of breath. [name], MD  Active   amLODipine (NORVASC) 10 MG tablet [id number] No Take 1 tablet (10 mg total) by mouth daily. [name], MD Taking Active   aspirin 81 MG tablet [id number] No Take 81 mg by mouth at bedtime.  [provider] Taking Active Family Member  atorvastatin (LIPITOR) 20 MG tablet [id number] No Take 20 mg by mouth daily. [provider] Taking Active   Blood Glucose Monitoring Suppl (ACCU-CHEK AVIVA PLUS) w/Device KIT [id number] No Use to check blood sugar 3 times per day dx  code E11.65 [name], MD Taking Active Family Member  donepezil (ARICEPT) 10 MG tablet [id number] No Take 1 tablet (10 mg total) by mouth at bedtime. [name], MD Taking Active   dorzolamide-timolol (COSOPT) 22.3-6.8 MG/ML ophthalmic solution [id number] No Place 1 drop into both eyes 2 (two) times daily.  [provider] Taking Active Family Member  escitalopram (LEXAPRO) 5 MG tablet [id number] No Take 1 tablet (5 mg total) by mouth at bedtime. [name], MD Taking Active   ferrous sulfate 325 (65 FE) MG tablet [id number] No Take 1 tablet (325 mg total) by mouth 2 (two) times daily. [name], MD Taking Active   fish oil-omega-3 fatty acids 1000 MG capsule [id number] No Take 1 g by mouth daily.  [provider] Taking Active Family Member  hydroxypropyl methylcellulose / hypromellose (ISOPTO TEARS / GONIOVISC) 2.5 % ophthalmic solution [id number] No Place 1 drop into both eyes 4 (four) times daily as needed for dry eyes.  [provider] Taking Active Family Member  indomethacin (INDOCIN) 50 MG capsule [id number] No Take 1 capsule (50 mg total) by mouth 2 (two) times daily as needed (gout). Take with food [name], NP Taking Active   Insulin Detemir (LEVEMIR FLEXTOUCH) 100 UNIT/ML Pen [id number] No Inject 26 units daily [name], [name], MD Taking Active   insulin regular (NOVOLIN R RELION) 100 units/mL injection [id number] No Inject 0.05-0.15 mLs (5-15 Units total) into the skin 3 (three) times daily with meals as needed for high blood sugar (per sliding scale). [name], MD Taking Active  Insulin Syringe-Needle U-100 (INSULIN SYRINGE .5CC/31GX5/16") 31G X 5/16" 0.5 ML MISC [id number] No Use one to inject insulin daily [name], MD Taking Active Family Member  latanoprost (XALATAN) 0.005 % ophthalmic solution [id number] No Place 1 drop into both eyes at bedtime.  [provider] Taking Active Family Member           Med Note (HARDEN,  SUMMER D   Mon Oct 26, 2017  5:16 PM)    liraglutide (VICTOZA) 18 MG/3ML SOPN [id number] No Inject 0.2 mLs (1.2 mg total) into the skin daily. [name], MD Taking Active   meclizine (ANTIVERT) 25 MG tablet [id number] No Take 1 tablet (25 mg total) by mouth 3 (three) times daily as needed for dizziness. [name], MD Taking Active Family Member  metFORMIN (GLUCOPHAGE) 1000 MG tablet [id number] No Take 1 tablet (1,000 mg total) by mouth 2 (two) times daily with a meal. [name], MD Taking Active            Med Note [name], [name]   Wed Nov 18, 2017  2:07 PM)    metoprolol tartrate (LOPRESSOR) 25 MG tablet [id number] No Take 1 tablet (25 mg total) by mouth 2 (two) times daily. [name], MD Taking Active   PREDNISOLONE ACETATE OP [id number] No Place 1 drop into the right eye daily. [provider] Taking Active Family Member           Med Note [facility], [name] Jan 04, 2019  8:29 AM)    Spacer/Aero-Holding Chambers (AEROCHAMBER PLUS) inhaler [id number]  Use with inhaler [name], MD  Active   tamsulosin [facility]) 0.4 MG CAPS capsule [id number] No Take 1 capsule (0.4 mg total) by mouth at bedtime. [name], MD Taking Active           Goals Addressed              This Visit's Progress   .  (THN)Monitor and Manage My Blood Sugar (pt-stated)        Timeframe:  Long-Range Goal Priority:  High Start Date:  02/29/2020                           Expected End Date: 09/17/2020                     Follow Up Date May 2022   - check blood sugar at prescribed times - check blood sugar if I feel it is too high or too low - enter blood sugar readings and medication or insulin into daily log - take the blood sugar meter to all doctor visits    Why is this important?   Checking your blood sugar at home helps to keep it from getting very high or very low.  Writing the results in a diary or log helps the doctor know how to care for you.  Your  blood sugar log should have the time, date and the results.  Also, write down the amount of insulin or other medicine that you take.  Other information, like what you ate, exercise done and how you were feeling, will also be helpful.     Notes:  05/29/2020-Spouse reports patient continues to adhere to diet restrictions. Appetite remains good, Blood sugars are controlled and managed at present. No recent A1C testing.     .  (THN)Set My Target A1C (pt-stated)  Timeframe:  Long-Range Goal Priority:  High Start Date:  02/29/2020                           Expected End Date: 09/17/2020                     Follow Up Date May 2022   - set target A1C(less than 7.0)    Why is this important?   Your target A1C is decided together by you and your doctor.  It is based on several things like your age and other health issues.    Notes:  05/29/2020-Patient has not had A1C testing since Oct 2021. Spouse aware to discuss with MD regarding need for testing and frequency.     .  (THN)Track and Manage My Symptoms-COVID (pt-stated)        Timeframe:  Short-Term Goal Priority:  High Start Date:  05/29/2020                           Expected End Date:  March 2022                     Follow Up Date May 2022   - begin a symptom diary - develop a rescue plan - follow rescue plan if symptoms flare-up - keep follow-up appointments    Why is this important?    Tracking your symptoms and other information about your health helps your doctor plan your care.   Write down the symptoms, the time of day, what you were doing and what medicine you are taking.   You will soon learn how to manage your symptoms.     Notes:  05/29/2020-Patient tested positive for COVID. Currently with very mild sxs and being managed at home. Patient/spouse aware of s/s of worsening condition and when to seek medical attention. Patient has received three doses of COVID vaccine.       Plan: RN CM discussed with spouse next  outreach within the month of May. She gave verbal consent and in agreement with RN CM follow up and timeframe. Spouse aware that they may contact RN CM sooner for any issues or concerns. RN CM reviewed goals and plan of care with patient. Patient in agreement.  RN CM will send quarterly update to PCP.  Enzo Montgomery, RN,BSN,CCM [place] Management Telephonic Care Management Coordinator Direct Phone: [phone] Toll Free: [phone] Fax: [phone]

## 2020-05-31 DIAGNOSIS — U071 COVID-19: Secondary | ICD-10-CM | POA: Diagnosis not present

## 2020-06-01 ENCOUNTER — Other Ambulatory Visit (HOSPITAL_COMMUNITY): Payer: Self-pay | Admitting: Family Medicine

## 2020-06-01 MED FILL — HYDROCODONE-CHLORPHEN ER SU: 10-8 | 5 days supply | Qty: 50 | Fill #0

## 2020-06-05 DIAGNOSIS — R3915 Urgency of urination: Secondary | ICD-10-CM | POA: Diagnosis not present

## 2020-06-12 DIAGNOSIS — R3915 Urgency of urination: Secondary | ICD-10-CM | POA: Diagnosis not present

## 2020-06-18 DIAGNOSIS — I10 Essential (primary) hypertension: Secondary | ICD-10-CM | POA: Diagnosis not present

## 2020-06-18 DIAGNOSIS — Z8551 Personal history of malignant neoplasm of bladder: Secondary | ICD-10-CM | POA: Diagnosis not present

## 2020-06-18 DIAGNOSIS — Z0000 Encounter for general adult medical examination without abnormal findings: Secondary | ICD-10-CM | POA: Diagnosis not present

## 2020-06-18 DIAGNOSIS — F330 Major depressive disorder, recurrent, mild: Secondary | ICD-10-CM | POA: Diagnosis not present

## 2020-06-18 DIAGNOSIS — E1169 Type 2 diabetes mellitus with other specified complication: Secondary | ICD-10-CM | POA: Diagnosis not present

## 2020-06-18 DIAGNOSIS — Z1159 Encounter for screening for other viral diseases: Secondary | ICD-10-CM | POA: Diagnosis not present

## 2020-06-18 DIAGNOSIS — Z8546 Personal history of malignant neoplasm of prostate: Secondary | ICD-10-CM | POA: Diagnosis not present

## 2020-06-18 DIAGNOSIS — E785 Hyperlipidemia, unspecified: Secondary | ICD-10-CM | POA: Diagnosis not present

## 2020-06-18 DIAGNOSIS — M109 Gout, unspecified: Secondary | ICD-10-CM | POA: Diagnosis not present

## 2020-06-19 DIAGNOSIS — R3915 Urgency of urination: Secondary | ICD-10-CM | POA: Diagnosis not present

## 2020-06-26 DIAGNOSIS — R3915 Urgency of urination: Secondary | ICD-10-CM | POA: Diagnosis not present

## 2020-07-03 DIAGNOSIS — R3915 Urgency of urination: Secondary | ICD-10-CM | POA: Diagnosis not present

## 2020-07-03 DIAGNOSIS — C61 Malignant neoplasm of prostate: Secondary | ICD-10-CM | POA: Diagnosis not present

## 2020-08-07 DIAGNOSIS — Z961 Presence of intraocular lens: Secondary | ICD-10-CM | POA: Diagnosis not present

## 2020-08-07 DIAGNOSIS — E119 Type 2 diabetes mellitus without complications: Secondary | ICD-10-CM | POA: Diagnosis not present

## 2020-08-07 DIAGNOSIS — Z794 Long term (current) use of insulin: Secondary | ICD-10-CM | POA: Diagnosis not present

## 2020-08-07 DIAGNOSIS — H26491 Other secondary cataract, right eye: Secondary | ICD-10-CM | POA: Diagnosis not present

## 2020-08-07 DIAGNOSIS — H401123 Primary open-angle glaucoma, left eye, severe stage: Secondary | ICD-10-CM | POA: Diagnosis not present

## 2020-08-07 DIAGNOSIS — H401112 Primary open-angle glaucoma, right eye, moderate stage: Secondary | ICD-10-CM | POA: Diagnosis not present

## 2020-08-17 DIAGNOSIS — I10 Essential (primary) hypertension: Secondary | ICD-10-CM | POA: Diagnosis not present

## 2020-08-17 DIAGNOSIS — E7800 Pure hypercholesterolemia, unspecified: Secondary | ICD-10-CM | POA: Diagnosis not present

## 2020-08-17 DIAGNOSIS — N189 Chronic kidney disease, unspecified: Secondary | ICD-10-CM | POA: Diagnosis not present

## 2020-08-17 DIAGNOSIS — E1165 Type 2 diabetes mellitus with hyperglycemia: Secondary | ICD-10-CM | POA: Diagnosis not present

## 2020-08-20 DIAGNOSIS — C61 Malignant neoplasm of prostate: Secondary | ICD-10-CM | POA: Diagnosis not present

## 2020-08-20 DIAGNOSIS — C678 Malignant neoplasm of overlapping sites of bladder: Secondary | ICD-10-CM | POA: Diagnosis not present

## 2020-08-27 ENCOUNTER — Other Ambulatory Visit: Payer: Self-pay

## 2020-08-27 NOTE — Patient Outreach (Signed)
[facility]) Care Management  08/27/2020  [name] 1940/12/15 [id number]   Telephone Assessment Annual Assessment   Successful outreach call placed. Spoke with spouse who reported she was still in the bed and handed phone for RN CM to speak with patient. Patient remains HOH and prefers to keep phone calls brief. He denies any acute issues or concerns at this time. States he has seen several of his MD's within the past few months and everything "checked out okay." He remains fairly independent with ADLs and most IADLs. Spouse is in the home, very supportive and able to assist patient. He shares that he recovered from [name] and is grateful that they did not get severely sick from it. No recent falls. He denies any RN CM needs or concerns at this time.    Medications Reviewed Today    Reviewed by [name], RN (Registered Nurse) on 08/27/20 at [phone]  Med List Status: <None>  Medication Order Taking? Sig Documenting Provider Last Dose Status Informant  ACCU-CHEK AVIVA PLUS test strip [id number] No USE AS INSTRUCTED TO CHECK BLOOD SUGAR THREE TIMES DAILY [name], MD Taking Active Family Member  acetaminophen (TYLENOL) 325 MG tablet [id number] No Take 2 tablets (650 mg total) by mouth every 6 (six) hours as needed for mild pain, fever or headache. [name], MD Taking Active   albuterol (VENTOLIN HFA) 108 (90 Base) MCG/ACT inhaler [id number]  Inhale 1-2 puffs into the lungs every 4 (four) hours as needed for wheezing or shortness of breath. [name], MD  Active   amLODipine (NORVASC) 10 MG tablet [id number] No Take 1 tablet (10 mg total) by mouth daily. [name], MD Taking Active   aspirin 81 MG tablet [id number] No Take 81 mg by mouth at bedtime.  [provider] Taking Active Family Member  atorvastatin (LIPITOR) 20 MG tablet [id number] No Take 20 mg by mouth daily. [provider] Taking Active   Blood Glucose Monitoring  Suppl (ACCU-CHEK AVIVA PLUS) w/Device KIT [id number] No Use to check blood sugar 3 times per day dx code E11.65 [name], MD Taking Active Family Member  chlorpheniramine-HYDROcodone (TUSSIONEX) 10-8 MG/5ML SUER [id number]  TAKE 5 ML BY MOUTH EVERY 12 HOURS AS NEEDED. [name], MD  Active   donepezil (ARICEPT) 10 MG tablet [id number] No Take 1 tablet (10 mg total) by mouth at bedtime. [name], MD Taking Active   dorzolamide-timolol (COSOPT) 22.3-6.8 MG/ML ophthalmic solution [id number] No Place 1 drop into both eyes 2 (two) times daily.  [provider] Taking Active Family Member  escitalopram (LEXAPRO) 5 MG tablet [id number] No Take 1 tablet (5 mg total) by mouth at bedtime. [name], MD Taking Active   ferrous sulfate 325 (65 FE) MG tablet [id number] No Take 1 tablet (325 mg total) by mouth 2 (two) times daily. [name], MD Taking Active   fish oil-omega-3 fatty acids 1000 MG capsule [id number] No Take 1 g by mouth daily.  [provider] Taking Active Family Member  hydroxypropyl methylcellulose / hypromellose (ISOPTO TEARS / GONIOVISC) 2.5 % ophthalmic solution [id number] No Place 1 drop into both eyes 4 (four) times daily as needed for dry eyes.  [provider] Taking Active Family Member  indomethacin (INDOCIN) 50 MG capsule [id number] No Take 1 capsule (50 mg total) by mouth 2 (two) times daily as needed (gout). Take with food [name], NP Taking Active   Insulin Detemir (LEVEMIR FLEXTOUCH) 100 UNIT/ML Pen [id number] No Inject 26  units daily [name], MD Taking Active   insulin regular (NOVOLIN R RELION) 100 units/mL injection [id number] No Inject 0.05-0.15 mLs (5-15 Units total) into the skin 3 (three) times daily with meals as needed for high blood sugar (per sliding scale). [name], MD Taking Active   Insulin Syringe-Needle U-100 (INSULIN SYRINGE .5CC/31GX5/16") 31G X 5/16" 0.5 ML MISC [id number] No Use one to inject insulin  daily [name], MD Taking Active Family Member  latanoprost (XALATAN) 0.005 % ophthalmic solution [id number] No Place 1 drop into both eyes at bedtime.  [provider] Taking Active Family Member           Med Note (HARDEN, SUMMER D   Mon Oct 26, 2017  5:16 PM)    liraglutide (VICTOZA) 18 MG/3ML SOPN [id number] No Inject 0.2 mLs (1.2 mg total) into the skin daily. [name], MD Taking Active   meclizine (ANTIVERT) 25 MG tablet [id number] No Take 1 tablet (25 mg total) by mouth 3 (three) times daily as needed for dizziness. [name], MD Taking Active Family Member  metFORMIN (GLUCOPHAGE) 1000 MG tablet [id number] No Take 1 tablet (1,000 mg total) by mouth 2 (two) times daily with a meal. [name], MD Taking Active            Med Note [name], [name]   Wed Nov 18, 2017  2:07 PM)    metoprolol tartrate (LOPRESSOR) 25 MG tablet [id number] No Take 1 tablet (25 mg total) by mouth 2 (two) times daily. [name], MD Taking Active   PREDNISOLONE ACETATE OP [id number] No Place 1 drop into the right eye daily. [provider] Taking Active Family Member           Med Note [facility], [name] Jan 04, 2019  8:29 AM)    Spacer/Aero-Holding Chambers (AEROCHAMBER PLUS) inhaler [id number]  Use with inhaler [name], MD  Active   tamsulosin [facility]) 0.4 MG CAPS capsule [id number] No Take 1 capsule (0.4 mg total) by mouth at bedtime. [name], MD Taking Active           SDOH Screenings   Alcohol Screen: Not on file  Depression [facility]): Low Risk   . PHQ-2 Score: 0  Financial Resource Strain: Not on file  Food Insecurity: No Food Insecurity  . Worried About Charity fundraiser in the Last Year: Never true  . Ran Out of Food in the Last Year: Never true  Housing: Not on file  Physical Activity: Not on file  Social Connections: Not on file  Stress: Not on file  Tobacco Use: Medium Risk  . Smoking Tobacco Use: Former Smoker  .  Smokeless Tobacco Use: Never Used  Transportation Needs: No Transportation Needs  . Lack of Transportation (Medical): No  . Lack of Transportation (Non-Medical): No    Fall Risk  08/27/2020 05/29/2020 02/29/2020 08/25/2019 05/16/2019  Falls in the past year? 0 0 0 0 0  Number falls in past yr: 0 0 0 0 0  Injury with Fall? 0 0 0 0 0  Risk for fall due to : Medication side effect Medication side effect;Impaired balance/gait Medication side effect Medication side effect Medication side effect;Impaired vision  Follow up Falls evaluation completed;Education provided Falls evaluation completed;Education provided Education provided;Falls evaluation completed Falls evaluation completed;Education provided Falls evaluation completed   Depression screen [facility] 2/9 08/27/2020 01/17/2019 06/15/2018 11/13/2017  Decreased Interest 0 0 0 0  Down, Depressed, Hopeless 0 0 0 0  PHQ - 2 Score 0 0 0 0   Goals Addressed              This Visit's Progress   .  COMPLETED: (THN)Monitor and Manage My Blood Sugar (pt-stated)        Timeframe:  Long-Range Goal Priority:  High Start Date:  02/29/2020                           Expected End Date: 09/17/2020                     Follow Up Date May 2022   - check blood sugar at prescribed times - check blood sugar if I feel it is too high or too low - enter blood sugar readings and medication or insulin into daily log - take the blood sugar meter to all doctor visits    Why is this important?   Checking your blood sugar at home helps to keep it from getting very high or very low.  Writing the results in a diary or log helps the doctor know how to care for you.  Your blood sugar log should have the time, date and the results.  Also, write down the amount of insulin or other medicine that you take.  Other information, like what you ate, exercise done and how you were feeling, will also be helpful.     Notes:  05/29/2020-Spouse reports patient continues to adhere to diet  restrictions. Appetite remains good, Blood sugars are controlled and managed at present. No recent A1C testing.   08/27/20-Patient reports diet and med adherence. Blood sugars WNL per pt. Denies any complications at present.     .  (THN)Set My Target A1C (pt-stated)        Timeframe:  Long-Range Goal Priority:  High Start Date:  02/29/2020                           Expected End Date: 12/18/2020                     Follow Up Date August 2022   - set target A1C(less than 7.0)    Why is this important?   Your target A1C is decided together by you and your doctor.  It is based on several things like your age and other health issues.    Notes:  05/29/2020-Patient has not had A1C testing since Oct 2021. Spouse aware to discuss with MD regarding need for testing and frequency.   08/27/20 Last A1C testing on file remains in Oct 2021-7.6. Although patient reports he saw endocrinologist recently and lab work done. He reports cbgs adherence in the mid 100's. He denies any hyper/hypoglycemic events.    .  COMPLETED: (THN)Track and Manage My Symptoms-COVID (pt-stated)        Timeframe:  Short-Term Goal Priority:  High Start Date:  05/29/2020                           Expected End Date:  March 2022                     Follow Up Date May 2022   - begin a symptom diary - develop a rescue plan - follow rescue plan if symptoms flare-up - keep follow-up appointments    Why is this  important?    Tracking your symptoms and other information about your health helps your doctor plan your care.   Write down the symptoms, the time of day, what you were doing and what medicine you are taking.   You will soon learn how to manage your symptoms.     Notes:  05/29/2020-Patient tested positive for COVID. Currently with very mild sxs and being managed at home. Patient/spouse aware of s/s of worsening condition and when to seek medical attention. Patient has received three doses of COVID vaccine.    08/27/20-Patient and spouse both had very mild sxs and have recovered well         Plan: RN CM discussed with patient next outreach within the month of August. Patient gave verbal consent and in agreement with RN CM follow up and timeframe. Patient aware that they may contact RN CM sooner for any issues or concerns. RN CM reviewed goals and plan of care with patient. Patient agrees to care plan and follow up. RN CM will send quarterly update to PCP.   Enzo Montgomery, RN,BSN,CCM [place] Management Telephonic Care Management Coordinator Direct Phone: [phone] Toll Free: [phone] Fax: [phone]

## 2020-09-13 DIAGNOSIS — D508 Other iron deficiency anemias: Secondary | ICD-10-CM | POA: Diagnosis not present

## 2020-09-13 DIAGNOSIS — Z8546 Personal history of malignant neoplasm of prostate: Secondary | ICD-10-CM | POA: Diagnosis not present

## 2020-09-13 DIAGNOSIS — F330 Major depressive disorder, recurrent, mild: Secondary | ICD-10-CM | POA: Diagnosis not present

## 2020-09-13 DIAGNOSIS — E785 Hyperlipidemia, unspecified: Secondary | ICD-10-CM | POA: Diagnosis not present

## 2020-09-13 DIAGNOSIS — E1159 Type 2 diabetes mellitus with other circulatory complications: Secondary | ICD-10-CM | POA: Diagnosis not present

## 2020-09-13 DIAGNOSIS — I10 Essential (primary) hypertension: Secondary | ICD-10-CM | POA: Diagnosis not present

## 2020-09-13 DIAGNOSIS — E1169 Type 2 diabetes mellitus with other specified complication: Secondary | ICD-10-CM | POA: Diagnosis not present

## 2020-11-01 DIAGNOSIS — F330 Major depressive disorder, recurrent, mild: Secondary | ICD-10-CM | POA: Diagnosis not present

## 2020-11-01 DIAGNOSIS — E1169 Type 2 diabetes mellitus with other specified complication: Secondary | ICD-10-CM | POA: Diagnosis not present

## 2020-11-01 DIAGNOSIS — I10 Essential (primary) hypertension: Secondary | ICD-10-CM | POA: Diagnosis not present

## 2020-11-01 DIAGNOSIS — D508 Other iron deficiency anemias: Secondary | ICD-10-CM | POA: Diagnosis not present

## 2020-11-01 DIAGNOSIS — E1159 Type 2 diabetes mellitus with other circulatory complications: Secondary | ICD-10-CM | POA: Diagnosis not present

## 2020-11-01 DIAGNOSIS — E785 Hyperlipidemia, unspecified: Secondary | ICD-10-CM | POA: Diagnosis not present

## 2020-11-22 ENCOUNTER — Other Ambulatory Visit: Payer: Self-pay

## 2020-11-22 NOTE — Patient Outreach (Signed)
[name] Care Management  11/22/2020  [name] 03-Jul-1940 [id number]   Telephone Assessment Quarterly Call   Unsuccessful outreach attempt to patient/spouse. No answer after multiple rings.       Plan: RN CM will make outreach attempt to patient within the month of Oct.  [name] Management Telephonic Care Management Coordinator Direct Phone: [phone] Toll Free: [phone] Fax: [phone]

## 2020-11-23 DIAGNOSIS — C678 Malignant neoplasm of overlapping sites of bladder: Secondary | ICD-10-CM | POA: Diagnosis not present

## 2020-11-23 DIAGNOSIS — R3915 Urgency of urination: Secondary | ICD-10-CM | POA: Diagnosis not present

## 2020-12-04 DIAGNOSIS — H401123 Primary open-angle glaucoma, left eye, severe stage: Secondary | ICD-10-CM | POA: Diagnosis not present

## 2020-12-04 DIAGNOSIS — H401112 Primary open-angle glaucoma, right eye, moderate stage: Secondary | ICD-10-CM | POA: Diagnosis not present

## 2020-12-04 DIAGNOSIS — Z961 Presence of intraocular lens: Secondary | ICD-10-CM | POA: Diagnosis not present

## 2020-12-04 DIAGNOSIS — Z794 Long term (current) use of insulin: Secondary | ICD-10-CM | POA: Diagnosis not present

## 2020-12-04 DIAGNOSIS — H26491 Other secondary cataract, right eye: Secondary | ICD-10-CM | POA: Diagnosis not present

## 2020-12-04 DIAGNOSIS — E119 Type 2 diabetes mellitus without complications: Secondary | ICD-10-CM | POA: Diagnosis not present

## 2021-01-01 DIAGNOSIS — F330 Major depressive disorder, recurrent, mild: Secondary | ICD-10-CM | POA: Diagnosis not present

## 2021-01-01 DIAGNOSIS — I10 Essential (primary) hypertension: Secondary | ICD-10-CM | POA: Diagnosis not present

## 2021-01-01 DIAGNOSIS — E1159 Type 2 diabetes mellitus with other circulatory complications: Secondary | ICD-10-CM | POA: Diagnosis not present

## 2021-01-01 DIAGNOSIS — E1169 Type 2 diabetes mellitus with other specified complication: Secondary | ICD-10-CM | POA: Diagnosis not present

## 2021-01-01 DIAGNOSIS — E785 Hyperlipidemia, unspecified: Secondary | ICD-10-CM | POA: Diagnosis not present

## 2021-01-01 DIAGNOSIS — Z8546 Personal history of malignant neoplasm of prostate: Secondary | ICD-10-CM | POA: Diagnosis not present

## 2021-01-01 DIAGNOSIS — N3281 Overactive bladder: Secondary | ICD-10-CM | POA: Diagnosis not present

## 2021-01-01 DIAGNOSIS — R3915 Urgency of urination: Secondary | ICD-10-CM | POA: Diagnosis not present

## 2021-01-01 DIAGNOSIS — D508 Other iron deficiency anemias: Secondary | ICD-10-CM | POA: Diagnosis not present

## 2021-01-01 DIAGNOSIS — C678 Malignant neoplasm of overlapping sites of bladder: Secondary | ICD-10-CM | POA: Diagnosis not present

## 2021-01-23 DIAGNOSIS — L918 Other hypertrophic disorders of the skin: Secondary | ICD-10-CM | POA: Diagnosis not present

## 2021-01-23 DIAGNOSIS — M25561 Pain in right knee: Secondary | ICD-10-CM | POA: Diagnosis not present

## 2021-01-23 DIAGNOSIS — R413 Other amnesia: Secondary | ICD-10-CM | POA: Diagnosis not present

## 2021-01-23 DIAGNOSIS — E785 Hyperlipidemia, unspecified: Secondary | ICD-10-CM | POA: Diagnosis not present

## 2021-01-23 DIAGNOSIS — I10 Essential (primary) hypertension: Secondary | ICD-10-CM | POA: Diagnosis not present

## 2021-01-23 DIAGNOSIS — F330 Major depressive disorder, recurrent, mild: Secondary | ICD-10-CM | POA: Diagnosis not present

## 2021-01-23 DIAGNOSIS — Z23 Encounter for immunization: Secondary | ICD-10-CM | POA: Diagnosis not present

## 2021-01-28 ENCOUNTER — Other Ambulatory Visit: Payer: Self-pay

## 2021-01-28 NOTE — Patient Outreach (Signed)
[name]) Care Management  01/28/2021  [name] 10-11-1940 [id number]    Telephone Assessment   Successful outreach call paled to the home. Spoke with spouse and patient heard in background and participated in conversation. They deny any acute issues or concerns at present. Patient had recent visit with PCP. He continues to remain fairly independent with ADLs/IADLs. No recent falls. Appetite WNL and wgt stable. No RN CM needs or concerns identified during this call.      Medications Reviewed Today     Reviewed by [name], RN (Registered Nurse) on 01/28/21 at [phone]  Med List Status: <None>   Medication Order Taking? Sig Documenting Provider Last Dose Status Informant  ACCU-CHEK AVIVA PLUS test strip [id number] No USE AS INSTRUCTED TO CHECK BLOOD SUGAR THREE TIMES DAILY Alan Snare, MD Taking Active Family Member  acetaminophen (TYLENOL) 325 MG tablet [id number] No Take 2 tablets (650 mg total) by mouth every 6 (six) hours as needed for mild pain, fever or headache. Alan Hockey, MD Taking Active   albuterol (VENTOLIN HFA) 108 (90 Base) MCG/ACT inhaler [id number]  Inhale 1-2 puffs into the lungs every 4 (four) hours as needed for wheezing or shortness of breath. Alan Ripple, MD  Active   amLODipine (NORVASC) 10 MG tablet [id number] No Take 1 tablet (10 mg total) by mouth daily. Alan Hockey, MD Taking Active   aspirin 81 MG tablet [id number] No Take 81 mg by mouth at bedtime.  [provider] Taking Active Family Member  atorvastatin (LIPITOR) 20 MG tablet [id number] No Take 20 mg by mouth daily. [provider] Taking Active   Blood Glucose Monitoring Suppl (ACCU-CHEK AVIVA PLUS) w/Device KIT [id number] No Use to check blood sugar 3 times per day dx code E11.65 Alan Snare, MD Taking Active Family Member  donepezil (ARICEPT) 10 MG tablet [id number] No Take 1 tablet (10 mg total) by mouth at bedtime. Alan Hockey, MD  Taking Active   dorzolamide-timolol (COSOPT) 22.3-6.8 MG/ML ophthalmic solution [id number] No Place 1 drop into both eyes 2 (two) times daily.  [provider] Taking Active Family Member  escitalopram (LEXAPRO) 5 MG tablet [id number] No Take 1 tablet (5 mg total) by mouth at bedtime. Alan Hockey, MD Taking Active   ferrous sulfate 325 (65 FE) MG tablet [id number] No Take 1 tablet (325 mg total) by mouth 2 (two) times daily. Alan Hockey, MD Taking Active   fish oil-omega-3 fatty acids 1000 MG capsule [id number] No Take 1 g by mouth daily.  [provider] Taking Active Family Member  hydroxypropyl methylcellulose / hypromellose (ISOPTO TEARS / GONIOVISC) 2.5 % ophthalmic solution [id number] No Place 1 drop into both eyes 4 (four) times daily as needed for dry eyes.  [provider] Taking Active Family Member  indomethacin (INDOCIN) 50 MG capsule [id number] No Take 1 capsule (50 mg total) by mouth 2 (two) times daily as needed (gout). Take with food Alan Halt A, NP Taking Active   Insulin Detemir (LEVEMIR FLEXTOUCH) 100 UNIT/ML Pen [id number] No Inject 26 units daily [name], [name], MD Taking Active   insulin regular (NOVOLIN R RELION) 100 units/mL injection [id number] No Inject 0.05-0.15 mLs (5-15 Units total) into the skin 3 (three) times daily with meals as needed for high blood sugar (per sliding scale). Alan Hockey, MD Taking Active   Insulin Syringe-Needle U-100 (INSULIN SYRINGE .5CC/31GX5/16") 31G X 5/16" 0.5 ML MISC [id number] No Use one to inject insulin daily Alan Snare, MD Taking Active Family Member  latanoprost (XALATAN) 0.005 % ophthalmic solution [id number] No Place 1 drop into both eyes at bedtime.  [provider] Taking Active Family Member           Med Note (HARDEN, SUMMER D   Mon Oct 26, 2017  5:16 PM)    liraglutide (VICTOZA) 18 MG/3ML SOPN [id number] No Inject 0.2 mLs (1.2 mg total) into the skin daily. Alan Hockey, MD Taking Active    meclizine (ANTIVERT) 25 MG tablet [id number] No Take 1 tablet (25 mg total) by mouth 3 (three) times daily as needed for dizziness. Alan Fuel, MD Taking Active Family Member  metFORMIN (GLUCOPHAGE) 1000 MG tablet [id number] No Take 1 tablet (1,000 mg total) by mouth 2 (two) times daily with Craig meal. Alan Hockey, MD Taking Active            Med Note [name]   Wed Nov 18, 2017  2:07 PM)    metoprolol tartrate (LOPRESSOR) 25 MG tablet [id number] No Take 1 tablet (25 mg total) by mouth 2 (two) times daily. Alan Hockey, MD Taking Active   PREDNISOLONE ACETATE OP [id number] No Place 1 drop into the right eye daily. [provider] Taking Active Family Member           Med Note [facility], [name] Jan 04, 2019  8:29 AM)    Spacer/Aero-Holding Chambers (AEROCHAMBER PLUS) inhaler [id number]  Use with inhaler Alan Ripple, MD  Active   tamsulosin [facility]) 0.4 MG CAPS capsule [id number] No Take 1 capsule (0.4 mg total) by mouth at bedtime. Alan Hockey, MD Taking Active                Goals Addressed               This Visit's Progress     (THN)Set My Target A1C (pt-stated)        Timeframe:  Long-Range Goal Priority:  High Start Date:  02/29/2020                           Expected End Date: Jan 2023                    Follow Up Date : Jan 2023    Barriers: Health Behaviors Knowledge   - set target A1C(less than 7.0)    Why is this important?   Your target A1C is decided together by you and your doctor.  It is based on several things like your age and other health issues.    Notes:  05/29/2020-Patient has not had A1C testing since Oct 2021. Spouse aware to discuss with MD regarding need for testing and frequency.   08/27/20 Last A1C testing on file remains in Oct 2021-7.6. Although patient reports he saw endocrinologist recently and lab work done. He reports cbgs adherence in the mid 100's. He denies any hyper/hypoglycemic  events.  01/28/21 Spouse states that they continue to check cbgs 3x/day.Blood sugars have been in the low to mid 100s. No recent A1C levels.        Plan: RN CM discussed with caregiver next outreach within the month of  Jan. Caregiver agrees to care plan and follow up. Caregiver gave verbal consent and in agreement with RN CM follow up and timeframe. Caregiver aware that they may contact RN CM sooner for any issues or concerns. RN CM reviewed goals and plan of care with caregiver. RN CM will  send quarterly update to PCP.  Enzo Montgomery, RN,BSN,CCM [facility] Telephonic Care Management Coordinator Direct Phone: [phone] Toll Free: [phone] Fax: [phone]

## 2021-02-18 DIAGNOSIS — N189 Chronic kidney disease, unspecified: Secondary | ICD-10-CM | POA: Diagnosis not present

## 2021-02-18 DIAGNOSIS — E7800 Pure hypercholesterolemia, unspecified: Secondary | ICD-10-CM | POA: Diagnosis not present

## 2021-02-18 DIAGNOSIS — E1165 Type 2 diabetes mellitus with hyperglycemia: Secondary | ICD-10-CM | POA: Diagnosis not present

## 2021-02-18 DIAGNOSIS — I10 Essential (primary) hypertension: Secondary | ICD-10-CM | POA: Diagnosis not present

## 2021-03-20 ENCOUNTER — Ambulatory Visit: Payer: Medicare HMO | Admitting: Cardiology

## 2021-03-24 ENCOUNTER — Emergency Department (HOSPITAL_COMMUNITY): Payer: Medicare HMO

## 2021-03-24 ENCOUNTER — Emergency Department (HOSPITAL_COMMUNITY)
Admission: EM | Admit: 2021-03-24 | Discharge: 2021-03-24 | Disposition: A | Discharge status: Home / Self Care | Payer: Medicare HMO | Attending: Emergency Medicine | Admitting: Emergency Medicine

## 2021-03-24 ENCOUNTER — Other Ambulatory Visit: Payer: Self-pay

## 2021-03-24 ENCOUNTER — Encounter (HOSPITAL_COMMUNITY): Payer: Self-pay

## 2021-03-24 DIAGNOSIS — Z20822 Contact with and (suspected) exposure to covid-19: Secondary | ICD-10-CM | POA: Insufficient documentation

## 2021-03-24 DIAGNOSIS — I10 Essential (primary) hypertension: Secondary | ICD-10-CM | POA: Diagnosis not present

## 2021-03-24 DIAGNOSIS — Z8551 Personal history of malignant neoplasm of bladder: Secondary | ICD-10-CM | POA: Diagnosis not present

## 2021-03-24 DIAGNOSIS — R059 Cough, unspecified: Secondary | ICD-10-CM | POA: Insufficient documentation

## 2021-03-24 DIAGNOSIS — Z87891 Personal history of nicotine dependence: Secondary | ICD-10-CM | POA: Insufficient documentation

## 2021-03-24 DIAGNOSIS — Z8546 Personal history of malignant neoplasm of prostate: Secondary | ICD-10-CM | POA: Diagnosis not present

## 2021-03-24 DIAGNOSIS — Z794 Long term (current) use of insulin: Secondary | ICD-10-CM | POA: Diagnosis not present

## 2021-03-24 DIAGNOSIS — Z7982 Long term (current) use of aspirin: Secondary | ICD-10-CM | POA: Insufficient documentation

## 2021-03-24 DIAGNOSIS — Z79899 Other long term (current) drug therapy: Secondary | ICD-10-CM | POA: Diagnosis not present

## 2021-03-24 DIAGNOSIS — Z7984 Long term (current) use of oral hypoglycemic drugs: Secondary | ICD-10-CM | POA: Diagnosis not present

## 2021-03-24 DIAGNOSIS — R0602 Shortness of breath: Secondary | ICD-10-CM | POA: Insufficient documentation

## 2021-03-24 DIAGNOSIS — R0600 Dyspnea, unspecified: Secondary | ICD-10-CM

## 2021-03-24 DIAGNOSIS — E119 Type 2 diabetes mellitus without complications: Secondary | ICD-10-CM | POA: Diagnosis not present

## 2021-03-24 DIAGNOSIS — R531 Weakness: Secondary | ICD-10-CM | POA: Insufficient documentation

## 2021-03-24 LAB — BLOOD GAS, VENOUS
Acid-Base Excess: 2.7 mmol/L — ABNORMAL HIGH (ref 0.0–2.0)
Bicarbonate: 28.5 mmol/L — ABNORMAL HIGH (ref 20.0–28.0)
O2 Saturation: 74.5 %
Patient temperature: 98.6
pCO2, Ven: 51.2 mmHg (ref 44.0–60.0)
pH, Ven: 7.364 (ref 7.250–7.430)
pO2, Ven: 43.9 mmHg (ref 32.0–45.0)

## 2021-03-24 LAB — COMPREHENSIVE METABOLIC PANEL
ALT: 24 U/L (ref 0–44)
AST: 25 U/L (ref 15–41)
Albumin: 4 g/dL (ref 3.5–5.0)
Alkaline Phosphatase: 64 U/L (ref 38–126)
Anion gap: 9 (ref 5–15)
BUN: 13 mg/dL (ref 8–23)
CO2: 27 mmol/L (ref 22–32)
Calcium: 9 mg/dL (ref 8.9–10.3)
Chloride: 105 mmol/L (ref 98–111)
Creatinine, Ser: 1.06 mg/dL (ref 0.61–1.24)
GFR, Estimated: >60 mL/min (ref >60)
Glucose, Bld: 163 mg/dL — ABNORMAL HIGH (ref 70–99)
Potassium: 3.5 mmol/L (ref 3.5–5.1)
Sodium: 141 mmol/L (ref 135–145)
Total Bilirubin: 0.8 mg/dL (ref 0.3–1.2)
Total Protein: 8.2 g/dL — ABNORMAL HIGH (ref 6.5–8.1)

## 2021-03-24 LAB — RESP PANEL BY RT-PCR (FLU A&B, COVID) ARPGX2
Influenza A by PCR: POSITIVE — AB
Influenza B by PCR: NEGATIVE
SARS Coronavirus 2 by RT PCR: NEGATIVE

## 2021-03-24 LAB — CBC WITH DIFFERENTIAL/PLATELET
Abs Immature Granulocytes: 0.01 10*3/uL (ref 0.00–0.07)
Basophils Absolute: 0 10*3/uL (ref 0.0–0.1)
Basophils Relative: 0 %
Eosinophils Absolute: 0.2 10*3/uL (ref 0.0–0.5)
Eosinophils Relative: 3 %
HCT: 38.5 % — ABNORMAL LOW (ref 39.0–52.0)
Hemoglobin: 12.6 g/dL — ABNORMAL LOW (ref 13.0–17.0)
Immature Granulocytes: 0 %
Lymphocytes Relative: 34 %
Lymphs Abs: 1.7 10*3/uL (ref 0.7–4.0)
MCH: 25.8 pg — ABNORMAL LOW (ref 26.0–34.0)
MCHC: 32.7 g/dL (ref 30.0–36.0)
MCV: 78.7 fL — ABNORMAL LOW (ref 80.0–100.0)
Monocytes Absolute: 0.9 10*3/uL (ref 0.1–1.0)
Monocytes Relative: 17 %
Neutro Abs: 2.3 10*3/uL (ref 1.7–7.7)
Neutrophils Relative %: 46 %
Platelets: 179 10*3/uL (ref 150–400)
RBC: 4.89 MIL/uL (ref 4.22–5.81)
RDW: 16.1 % — ABNORMAL HIGH (ref 11.5–15.5)
WBC: 5.1 10*3/uL (ref 4.0–10.5)
nRBC: 0 % (ref 0.0–0.2)

## 2021-03-24 LAB — BRAIN NATRIURETIC PEPTIDE: B Natriuretic Peptide: 92.2 pg/mL (ref 0.0–100.0)

## 2021-03-24 LAB — TROPONIN I (HIGH SENSITIVITY): Troponin I (High Sensitivity): 10 ng/L (ref <18)

## 2021-03-24 NOTE — ED Triage Notes (Signed)
Pt BIB EMS. Pt c/o SOB, and cough for the last two weeks. Per EMS pt was 90% RA, pt placed on 2LNC, O2 increased to 97%. Per wife pt has become more sluggish over the past month. Denies CP. Hx DM

## 2021-03-24 NOTE — ED Provider Notes (Signed)
[place] DEPT Provider Note   CSN: [id number] Arrival date & time: 03/24/21  1018     History Chief Complaint  Patient presents with   Shortness of Breath   Weakness    [name] is a 80 y.o. male.  HPI    81 year old male comes in with chief complaint of shortness of breath and weakness.  He has history of sarcoidosis and diabetes.  Patient reports that this morning he woke up with shortness of breath.  He had no chest pain, wheezing.  He has had some cough for the last few days but no other URI-like symptoms.  Patient also had some weakness like feeling since last few days.  Review of system is negative for any fevers, chills. Patient denies any history of coronary artery disease, chest pain or shortness of breath with exertion or with laying flat. Pt has no hx of PE, DVT and denies any exogenous hormone (testosterone / estrogen) use, long distance travels or surgery in the past 6 weeks, active cancer, recent immobilization.    Past Medical History:  Diagnosis Date   Diabetes mellitus    insulin dependent   Heart disease, unspecified    Hypercholesterolemia    Hypertension    Sarcoidosis    Unspecified glaucoma(365.9)     Patient Active Problem List   Diagnosis Date Noted   Fever in adult 10/26/2017   Bladder cancer ([place]) 10/26/2017   Mediastinal lymphadenopathy 10/26/2017   Polyarthralgia 10/26/2017   Effusion of left knee joint 10/26/2017   Angioedema 02/01/2014   Essential hypertension 02/01/2014   Type 2 diabetes mellitus with diabetic nephropathy ([place]) 02/01/2014   Hyperlipemia 02/01/2014   Pure hypercholesterolemia 12/08/2012   Type 2 diabetes mellitus with other specified complication ([place]) 78/29/5621   Unspecified essential hypertension 12/06/2012   Prostate cancer ([place]) 04/30/2011    Past Surgical History:  Procedure Laterality Date   CATARACT EXTRACTION     knee replacement surgery     right        Family History  Problem Relation Age of Onset   Hypertension Mother    Diabetes Mellitus I Mother    Hypertension Father    Coronary artery disease Father    Hypertension Sister    Hypertension Brother    Coronary artery disease Brother    Diabetes Mellitus I Brother    Diabetes Mellitus I Brother    Cancer Neg Hx     Social History   Tobacco Use   Smoking status: Former    Packs/day: 0.50    Types: Cigarettes    Quit date: 04/21/2005    Years since quitting: 15.9   Smokeless tobacco: Never  Substance Use Topics   Alcohol use: No   Drug use: No    Home Medications Prior to Admission medications   Medication Sig Start Date End Date Taking? Authorizing Provider  ACCU-CHEK AVIVA PLUS test strip USE AS INSTRUCTED TO CHECK BLOOD SUGAR THREE TIMES DAILY 11/11/16   [name], MD  acetaminophen (TYLENOL) 325 MG tablet Take 2 tablets (650 mg total) by mouth every 6 (six) hours as needed for mild pain, fever or headache. 10/30/17   Denton Brick, [name], MD  albuterol (VENTOLIN HFA) 108 (90 Base) MCG/ACT inhaler Inhale 1-2 puffs into the lungs every 4 (four) hours as needed for wheezing or shortness of breath. 05/22/20   [name], MD  amLODipine (NORVASC) 10 MG tablet Take 1 tablet (10 mg total) by mouth daily. 10/30/17   [name],  [name], MD  aspirin 81 MG tablet Take 81 mg by mouth at bedtime.     [provider]  atorvastatin (LIPITOR) 20 MG tablet Take 20 mg by mouth daily. 12/16/18   [provider]  Blood Glucose Monitoring Suppl (ACCU-CHEK AVIVA PLUS) w/Device KIT Use to check blood sugar 3 times per day dx code E11.65 03/12/16   [name], MD  donepezil (ARICEPT) 10 MG tablet Take 1 tablet (10 mg total) by mouth at bedtime. 10/30/17   [name], MD  dorzolamide-timolol (COSOPT) 22.3-6.8 MG/ML ophthalmic solution Place 1 drop into both eyes 2 (two) times daily.     [provider]  escitalopram (LEXAPRO) 5 MG tablet Take 1 tablet (5 mg  total) by mouth at bedtime. 10/30/17   [name], MD  ferrous sulfate 325 (65 FE) MG tablet Take 1 tablet (325 mg total) by mouth 2 (two) times daily. 10/30/17   [name], MD  fish oil-omega-3 fatty acids 1000 MG capsule Take 1 g by mouth daily.     [provider]  hydroxypropyl methylcellulose / hypromellose (ISOPTO TEARS / GONIOVISC) 2.5 % ophthalmic solution Place 1 drop into both eyes 4 (four) times daily as needed for dry eyes.     [provider]  indomethacin (INDOCIN) 50 MG capsule Take 1 capsule (50 mg total) by mouth 2 (two) times daily as needed (gout). Take with food 12/24/19   [name], NP  Insulin Detemir (LEVEMIR FLEXTOUCH) 100 UNIT/ML Pen Inject 26 units daily 10/30/17   [name], [name], MD  insulin regular (NOVOLIN R RELION) 100 units/mL injection Inject 0.05-0.15 mLs (5-15 Units total) into the skin 3 (three) times daily with meals as needed for high blood sugar (per sliding scale). 10/30/17   [name], MD  Insulin Syringe-Needle U-100 (INSULIN SYRINGE .5CC/31GX5/16") 31G X 5/16" 0.5 ML MISC Use one to inject insulin daily 05/02/15   [name], MD  latanoprost (XALATAN) 0.005 % ophthalmic solution Place 1 drop into both eyes at bedtime.  02/17/14   [provider]  liraglutide (VICTOZA) 18 MG/3ML SOPN Inject 0.2 mLs (1.2 mg total) into the skin daily. 10/30/17   [name], MD  meclizine (ANTIVERT) 25 MG tablet Take 1 tablet (25 mg total) by mouth 3 (three) times daily as needed for dizziness. 8/33/82   [name], MD  metFORMIN (GLUCOPHAGE) 1000 MG tablet Take 1 tablet (1,000 mg total) by mouth 2 (two) times daily with a meal. 10/30/17   [name], [name], MD  metoprolol tartrate (LOPRESSOR) 25 MG tablet Take 1 tablet (25 mg total) by mouth 2 (two) times daily. 10/30/17   [name], MD  PREDNISOLONE ACETATE OP Place 1 drop into the right eye daily.    [provider]  Spacer/Aero-Holding Chambers (AEROCHAMBER  PLUS) inhaler Use with inhaler 05/22/20   [name], MD  tamsulosin (FLOMAX) 0.4 MG CAPS capsule Take 1 capsule (0.4 mg total) by mouth at bedtime. 10/30/17   [name], MD    Allergies    Ace inhibitors and Allopurinol  Review of Systems   Review of Systems  Constitutional:  Positive for activity change.  Respiratory:  Positive for shortness of breath. Negative for cough.   Cardiovascular:  Negative for chest pain.  Gastrointestinal:  Negative for nausea and vomiting.  Hematological:  Does not bruise/bleed easily.  All other systems reviewed and are negative.  Physical Exam Updated Vital Signs BP (!) 168/80   Pulse 71   Temp 98.4 F (36.9  C) (Oral)   Resp 20   SpO2 100%   Physical Exam Vitals and nursing note reviewed.  Constitutional:      Appearance: He is well-developed.  HENT:     Head: Atraumatic.  Cardiovascular:     Rate and Rhythm: Normal rate.  Pulmonary:     Effort: Pulmonary effort is normal.     Breath sounds: No decreased breath sounds, wheezing, rhonchi or rales.     Comments: 100% O2 sats on room air Musculoskeletal:     Cervical back: Neck supple.     Right lower leg: No tenderness. No edema.     Left lower leg: No tenderness. No edema.  Skin:    General: Skin is warm.  Neurological:     Mental Status: He is alert and oriented to person, place, and time.    ED Results / Procedures / Treatments   Labs (all labs ordered are listed, but only abnormal results are displayed) Labs Reviewed  COMPREHENSIVE METABOLIC PANEL - Abnormal; Notable for the following components:      Result Value   Glucose, Bld 163 (*)    Total Protein 8.2 (*)    All other components within normal limits  CBC WITH DIFFERENTIAL/PLATELET - Abnormal; Notable for the following components:   Hemoglobin 12.6 (*)    HCT 38.5 (*)    MCV 78.7 (*)    MCH 25.8 (*)    RDW 16.1 (*)    All other components within normal limits  BLOOD GAS, VENOUS - Abnormal; Notable for  the following components:   Bicarbonate 28.5 (*)    Acid-Base Excess 2.7 (*)    All other components within normal limits  RESP PANEL BY RT-PCR (FLU A&B, COVID) ARPGX2  BRAIN NATRIURETIC PEPTIDE  URINALYSIS, ROUTINE W REFLEX MICROSCOPIC  TROPONIN I (HIGH SENSITIVITY)  TROPONIN I (HIGH SENSITIVITY)    EKG EKG Interpretation  Date/Time:  _56  year old male comes in with chief complaint of shortness of breath.  He had acute shortness of breath this morning, unprovoked that prompted him to call the ER.  Patient's wife at the bedside.  She reports that patient  was not in any respiratory distress, he just complained of shortness of breath.   Paramedics reported that his O2 sats were low when they arrived and patient was started on oxygen.  Patient no longer has any shortness of breath.  At no point did he have chest pain.  He has no history of PE, DVT and is fairly active.  He has not had any shortness of breath with exertion recently.  On exam, patient does not have any signs of DVT and his lung exam is completely normal.  I had considered pulmonary edema, pleural effusion, flu, ACS, CHF in the differential diagnosis.  PE considered but thought to be lower possibility.   Initial work-up including x-ray is reassuring.  During my exam patient is feeling quite well and O2 sats are at 100%. I will add troponin and BNP to ensure there is no atypical presentation for ACS.  The troponin and BNP can also act as a surrogate for PE.  If the work-up is negative, patient will be discharged with strict ER return precautions.  5:25 PM The patient appears reasonably screened and/or stabilized for discharge and I doubt any other medical condition or other [facility] requiring further screening, evaluation, or treatment in the ED at this time prior to discharge.   Results from the ER workup discussed with the patient face to face and all questions answered to the best of my ability. The patient is safe for discharge with strict return precautions.   Final Clinical Impression(s) / ED Diagnoses Final diagnoses:  Dyspnea, unspecified type    Rx / DC Orders ED Discharge Orders     None        Varney Biles, MD 03/24/21 1725

## 2021-03-24 NOTE — Discharge Instructions (Addendum)
All the results in the ER are normal, labs and imaging. We are not sure what is causing your symptoms. The workup in the ER is not complete, and is limited to screening for life threatening and emergent conditions only, so please see a primary care doctor for further evaluation.  

## 2021-03-24 NOTE — ED Notes (Signed)
Pt is all clean up and change and on monitor.

## 2021-03-24 NOTE — ED Provider Notes (Signed)
Emergency Medicine Provider Triage Evaluation Note  [name] , a 80 y.o. male  was evaluated in triage.  Pt complains of SOB, DOE, and had a cough for the past two weeks. SpO2 at home at 90% per EMS. EMS put on 2L and bumped up to 97%. Wife reports he has been more "sluggish" for the past month. Denies any chest pain. Denies any abdominal pain. The patient reports that he is feeling better now that he is on the oxygen.  CBG 178 Full ambulatory for EMS.  Review of Systems  Positive: SOB, DOE, fatigue Negative: Chest pain, abdominal pain, lightheadedness, weakness  Physical Exam  BP (!) 161/72 (BP Location: Left Arm)   Pulse 73   Temp 98.4 F (36.9 C) (Oral)   Resp 18   SpO2 99%  Gen:   Awake, no distress   Resp:  Normal effort, slight rhonchi heard in bilateral bases  MSK:   Moves extremities without difficulty  Other:  Pulses equal bilaterally. Good grip strength. Patient speaking in full sentences.   Medical Decision Making  Medically screening exam initiated at 10:32 AM.  Appropriate orders placed.  Maxine Huynh was informed that the remainder of the evaluation will be completed by another provider, this initial triage assessment does not replace that evaluation, and the importance of remaining in the ED until their evaluation is complete.  Labs and imaging ordered.   Sherrell Puller, PA-C 03/24/21 1039    Dorie Rank, MD 03/25/21 [phone]

## 2021-03-24 NOTE — ED Notes (Signed)
Pt pulse oximetry in 70 while ambulating in room around bed x2

## 2021-03-27 DIAGNOSIS — F330 Major depressive disorder, recurrent, mild: Secondary | ICD-10-CM | POA: Diagnosis not present

## 2021-03-27 DIAGNOSIS — I10 Essential (primary) hypertension: Secondary | ICD-10-CM | POA: Diagnosis not present

## 2021-03-27 DIAGNOSIS — E785 Hyperlipidemia, unspecified: Secondary | ICD-10-CM | POA: Diagnosis not present

## 2021-03-27 DIAGNOSIS — E1169 Type 2 diabetes mellitus with other specified complication: Secondary | ICD-10-CM | POA: Diagnosis not present

## 2021-03-27 DIAGNOSIS — D508 Other iron deficiency anemias: Secondary | ICD-10-CM | POA: Diagnosis not present

## 2021-03-27 DIAGNOSIS — E1159 Type 2 diabetes mellitus with other circulatory complications: Secondary | ICD-10-CM | POA: Diagnosis not present

## 2021-04-26 ENCOUNTER — Other Ambulatory Visit: Payer: Self-pay

## 2021-04-26 NOTE — Patient Outreach (Signed)
[facility]) Care Management  04/26/2021  [name] May 26, 1940 [id number]   Telephone Assessment Quarterly Call-Annual Assessment   Successful outreach call tot the home. Spoke with spouse. She reports patient is doing well. She shares that patient has the flu last month and had to visit the ED for some SOB. He was treated and sxs have since resolved and patient back to baseline. He continues to be fairly independent with ADLs/IADLs home. Spouse assists patient with managing meds,finance and household chores. She denies any recent falls. Appetite remains WNL for patient and wgt stable. She denies any RN CM needs or concerns at this time.     Medications Reviewed Today     Reviewed by [name], RN (Registered Nurse) on 04/26/21 at 1053  Med List Status: <None>   Medication Order Taking? Sig Documenting Provider Last Dose Status Informant  ACCU-CHEK AVIVA PLUS test strip [id number] No USE AS INSTRUCTED TO CHECK BLOOD SUGAR THREE TIMES DAILY [name], MD Taking Active Family Member  acetaminophen (TYLENOL) 325 MG tablet [id number] No Take 2 tablets (650 mg total) by mouth every 6 (six) hours as needed for mild pain, fever or headache. [name], MD Taking Active   albuterol (VENTOLIN HFA) 108 (90 Base) MCG/ACT inhaler [id number]  Inhale 1-2 puffs into the lungs every 4 (four) hours as needed for wheezing or shortness of breath. [name], MD  Active   amLODipine (NORVASC) 10 MG tablet [id number] No Take 1 tablet (10 mg total) by mouth daily. [name], MD Taking Active   aspirin 81 MG tablet [id number] No Take 81 mg by mouth at bedtime.  [provider] Taking Active Family Member  atorvastatin (LIPITOR) 20 MG tablet [id number] No Take 20 mg by mouth daily. [provider] Taking Active   Blood Glucose Monitoring Suppl (ACCU-CHEK AVIVA PLUS) w/Device KIT [id number] No Use to check blood sugar 3 times per day dx code  E11.65 [name], MD Taking Active Family Member  donepezil (ARICEPT) 10 MG tablet [id number] No Take 1 tablet (10 mg total) by mouth at bedtime. [name], MD Taking Active   dorzolamide-timolol (COSOPT) 22.3-6.8 MG/ML ophthalmic solution [id number] No Place 1 drop into both eyes 2 (two) times daily.  [provider] Taking Active Family Member  escitalopram (LEXAPRO) 5 MG tablet [id number] No Take 1 tablet (5 mg total) by mouth at bedtime. [name], MD Taking Active   ferrous sulfate 325 (65 FE) MG tablet [id number] No Take 1 tablet (325 mg total) by mouth 2 (two) times daily. [name], MD Taking Active   fish oil-omega-3 fatty acids 1000 MG capsule [id number] No Take 1 g by mouth daily.  [provider] Taking Active Family Member  hydroxypropyl methylcellulose / hypromellose (ISOPTO TEARS / GONIOVISC) 2.5 % ophthalmic solution [id number] No Place 1 drop into both eyes 4 (four) times daily as needed for dry eyes.  [provider] Taking Active Family Member  indomethacin (INDOCIN) 50 MG capsule [id number] No Take 1 capsule (50 mg total) by mouth 2 (two) times daily as needed (gout). Take with food [name], NP Taking Active   Insulin Detemir (LEVEMIR FLEXTOUCH) 100 UNIT/ML Pen [id number] No Inject 26 units daily [name], [name], MD Taking Active   insulin regular (NOVOLIN R RELION) 100 units/mL injection [id number] No Inject 0.05-0.15 mLs (5-15 Units total) into the skin 3 (three) times daily with meals as needed for high blood sugar (per sliding scale). [name], MD Taking Active  Insulin Syringe-Needle U-100 (INSULIN SYRINGE .5CC/31GX5/16") 31G X 5/16" 0.5 ML MISC [id number] No Use one to inject insulin daily [name], MD Taking Active Family Member  latanoprost (XALATAN) 0.005 % ophthalmic solution [id number] No Place 1 drop into both eyes at bedtime.  [provider] Taking Active Family Member           Med Note (HARDEN, SUMMER D    Mon Oct 26, 2017  5:16 PM)    liraglutide (VICTOZA) 18 MG/3ML SOPN [id number] No Inject 0.2 mLs (1.2 mg total) into the skin daily. [name], MD Taking Active   meclizine (ANTIVERT) 25 MG tablet [id number] No Take 1 tablet (25 mg total) by mouth 3 (three) times daily as needed for dizziness. [name], MD Taking Active Family Member  metFORMIN (GLUCOPHAGE) 1000 MG tablet [id number] No Take 1 tablet (1,000 mg total) by mouth 2 (two) times daily with a meal. [name], MD Taking Active            Med Note [name], [name]   Wed Nov 18, 2017  2:07 PM)    metoprolol tartrate (LOPRESSOR) 25 MG tablet [id number] No Take 1 tablet (25 mg total) by mouth 2 (two) times daily. [name], MD Taking Active   PREDNISOLONE ACETATE OP [id number] No Place 1 drop into the right eye daily. [provider] Taking Active Family Member           Med Note [facility], [name] Jan 04, 2019  8:29 AM)    Spacer/Aero-Holding Chambers (AEROCHAMBER PLUS) inhaler [id number]  Use with inhaler [name], MD  Active   tamsulosin [facility]) 0.4 MG CAPS capsule [id number] No Take 1 capsule (0.4 mg total) by mouth at bedtime. [name], MD Taking Active             . Depression screen [facility] 2/9 04/26/2021 08/27/2020 01/17/2019 06/15/2018 11/13/2017  Decreased Interest 0 0 0 0 0  Down, Depressed, Hopeless 0 0 0 0 0  PHQ - 2 Score 0 0 0 0 0    Fall Risk 05/22/2020 05/29/2020 08/27/2020 01/28/2021 04/26/2021  Falls in the past year? - 0 0 0 0  Was there an injury with Fall? - 0 0 0 0  Fall Risk Category Calculator - 0 0 0 0  Fall Risk Category - Low Low Low Low  Patient Fall Risk Level _0   Patient at Risk for Falls Due to - Medication side effect;Impaired balance/gait Medication side effect Medication side effect Medication side effect  Fall risk Follow up - Falls evaluation completed;Education provided Falls evaluation  completed;Education provided Falls evaluation completed Falls evaluation completed;Education provided    SDOH Screenings   Alcohol Screen: Not on file  Depression (PHQ2-9): Low Risk    PHQ-2 Score: 0  Financial Resource Strain: Not on file  Food Insecurity: No Food Insecurity   Worried About Charity fundraiser in the Last Year: Never true   Ran Out of Food in the Last Year: Never true  Housing: Not on file  Physical Activity: Not on file  Social Connections: Not on file  Stress: Not on file  Tobacco Use: Medium Risk   Smoking Tobacco Use: Former   Smokeless Tobacco Use: Never   Passive Exposure: Not on file  Transportation Needs: No Transportation Needs   Lack of Transportation (Medical): No   Lack of Transportation (Non-Medical): No  Care Plan : Diabetes Type 2 (Adult)  Updates made by [name], RN since 04/26/2021 12:00 AM     Problem: Glycemic Management (Diabetes, Type 2)      Long-Range Goal: Glycemic Management Optimized-Maintain A1C of 7.0 or less Completed 04/26/2021  Start Date: 02/29/2020  Expected End Date: 05/20/2021  This Visit's Progress: Not on track  Recent Progress: On track  Priority: High  Note:    Notes:   05/29/2020- RN CM educated patient on importance of adhering to testing frequency and normal parameters RN CM discussed importance of routine A1C testing and the need to talk with MD regarding next lab work date. RN CM reviewed cbg monitoring log and values with patient. RN CM reviewed s/s of hypo/hyperglycemia and when to seek medical attention RN CM confirmed patient has action plan in place  Barriers: Health Behaviors Knowledge   08/27/20 RN CM educated patient on importance of adhering to testing frequency and normal parameters RN CM discussed importance of routine A1C testing and the need to talk with MD regarding next lab work date. RN CM reviewed cbg monitoring log and values with patient/caregiver.  01/28/21 RN CM  assessed for med adherence and completed med review and review DM meds  RN CM provided education on diabetic diet  06/[birth year]-[id number] due to duplicate care plan and goals    Task: Alleviate Barriers to Glycemic Management Completed 04/26/2021  Note:   Care Management Activities:    - blood glucose monitoring encouraged - blood glucose readings reviewed - mutual A1C goal set or reviewed    Notes:    Care Plan : RN Care Manager POC  Updates made by [name], RN since 04/26/2021 12:00 AM     Problem: Chronic Disease Mgmt of Chronic Condition-DM   Priority: High     Long-Range Goal: Development of POC for Mgmt of Chronic Condition-DM   Start Date: 04/26/2021  Priority: High  Note:     Current Barriers:  Chronic Disease Management support and education needs related to DMII   RNCM Clinical Goal(s):  Patient will verbalize understanding of plan for management of DMII as evidenced by mgmt of chronic condition demonstrate Improved adherence to prescribed treatment plan for DMII as evidenced by lowering of A1C level continue to work with RN Care Manager to address care management and care coordination needs related to  DMII as evidenced by adherence to CM Team Scheduled appointments through collaboration with RN Care manager, provider, and care team.   Interventions: POC sent to PCP upon initial assessment, quarterly and with any changes in patient's conditions Inter-disciplinary care team collaboration (see longitudinal plan of care) Evaluation of current treatment plan related to  self management and patient's adherence to plan as established by provider   Diabetes Interventions:  (Status:  New goal.) Long Term Goal Assessed patient's understanding of A1c goal: <7% Provided education to patient about basic DM disease process Reviewed medications with patient and discussed importance of medication adherence Counseled on importance of regular laboratory monitoring as  prescribed Discussed plans with patient for ongoing care management follow up and provided patient with direct contact information for care management team Lab Results  Component Value Date   HGBA1C 10.0 (H) 10/26/2017  04/26/21-Spouse reports patient saw endocrinologist on 02/18/21-A1C level at that time was 8.1-slightly up from previous value of 7.6.   Patient Goals/Self-Care Activities: Take all medications as prescribed Attend all scheduled provider appointments Call provider office for new concerns or questions  keep appointment  with eye doctor check blood sugar at prescribed times: 2-3x/day check feet daily for cuts, sores or redness enter blood sugar readings and medication or insulin into daily log  Follow Up Plan:  Telephone follow up appointment with care management team member scheduled for:  quarterly-within the month of April The patient has been provided with contact information for the care management team and has been advised to call with any health related questions or concerns.        Plan: RN CM discussed with caregiver next outreach within the month of April. Caregiver agrees to care plan and follow up.  Enzo Montgomery, RN,BSN,CCM [facility] Telephonic Care Management Coordinator Direct Phone: [phone] Toll Free: [phone] Fax: [phone]

## 2021-04-28 ENCOUNTER — Other Ambulatory Visit: Payer: Self-pay

## 2021-04-28 ENCOUNTER — Encounter (HOSPITAL_COMMUNITY): Payer: Self-pay | Admitting: Emergency Medicine

## 2021-04-28 ENCOUNTER — Ambulatory Visit (HOSPITAL_COMMUNITY)
Admission: EM | Admit: 2021-04-28 | Discharge: 2021-04-28 | Disposition: A | Discharge status: Home / Self Care | Payer: Medicare HMO | Attending: Urgent Care | Admitting: Urgent Care

## 2021-04-28 ENCOUNTER — Ambulatory Visit (INDEPENDENT_AMBULATORY_CARE_PROVIDER_SITE_OTHER): Payer: Medicare HMO

## 2021-04-28 DIAGNOSIS — R6883 Chills (without fever): Secondary | ICD-10-CM

## 2021-04-28 DIAGNOSIS — R52 Pain, unspecified: Secondary | ICD-10-CM | POA: Diagnosis not present

## 2021-04-28 DIAGNOSIS — R251 Tremor, unspecified: Secondary | ICD-10-CM | POA: Insufficient documentation

## 2021-04-28 DIAGNOSIS — Z20822 Contact with and (suspected) exposure to covid-19: Secondary | ICD-10-CM | POA: Diagnosis not present

## 2021-04-28 DIAGNOSIS — R059 Cough, unspecified: Secondary | ICD-10-CM | POA: Diagnosis not present

## 2021-04-28 DIAGNOSIS — W19XXXA Unspecified fall, initial encounter: Secondary | ICD-10-CM

## 2021-04-28 DIAGNOSIS — Y929 Unspecified place or not applicable: Secondary | ICD-10-CM | POA: Diagnosis not present

## 2021-04-28 DIAGNOSIS — R531 Weakness: Secondary | ICD-10-CM | POA: Diagnosis not present

## 2021-04-28 DIAGNOSIS — W01190A Fall on same level from slipping, tripping and stumbling with subsequent striking against furniture, initial encounter: Secondary | ICD-10-CM | POA: Diagnosis not present

## 2021-04-28 DIAGNOSIS — Y939 Activity, unspecified: Secondary | ICD-10-CM | POA: Insufficient documentation

## 2021-04-28 DIAGNOSIS — F0390 Unspecified dementia without behavioral disturbance: Secondary | ICD-10-CM | POA: Diagnosis not present

## 2021-04-28 LAB — POCT URINALYSIS DIPSTICK, ED / UC
Bilirubin Urine: NEGATIVE
Glucose, UA: NEGATIVE mg/dL
Hgb urine dipstick: NEGATIVE
Leukocytes,Ua: NEGATIVE
Nitrite: NEGATIVE
Protein, ur: 100 mg/dL — AB
Specific Gravity, Urine: 1.015 (ref 1.005–1.030)
Urobilinogen, UA: 0.2 mg/dL (ref 0.0–1.0)
pH: 6 (ref 5.0–8.0)

## 2021-04-28 LAB — POC INFLUENZA A AND B ANTIGEN (URGENT CARE ONLY)
INFLUENZA A ANTIGEN, POC: NEGATIVE
INFLUENZA B ANTIGEN, POC: NEGATIVE
Influenza A Ag: NEGATIVE
Influenza B Ag: NEGATIVE

## 2021-04-28 LAB — SARS CORONAVIRUS 2 (TAT 6-24 HRS): SARS Coronavirus 2: NEGATIVE

## 2021-04-28 NOTE — ED Triage Notes (Signed)
Pt's wife states that patient has dementia and been sleeping more so PCP referred him to specialist.  Pt c/o body aches since last night. Pt states that he has shakes this morning around 2am. Wife called EMS this morning due to body pains and shaking. Reports told vital signs were good. Since he still c/o pain she brought him to UC.

## 2021-04-28 NOTE — Discharge Instructions (Addendum)
Your flu test is negative. We will call with results of the covid test once results obtained. Your chest xray is negative. Your urine is clear. It is uncertain the cause of your chills, but there is no clear cut signs of infection in office today. Your vitals look stable. Your jaw shows no evidence of acute injury. Please monitor for any changes and return to clinic if any new concerns arise. Please follow up with your PCP this week for a follow up and recheck.

## 2021-04-28 NOTE — ED Provider Notes (Signed)
[facility]    CSN: [id number] Arrival date & time: 04/28/21  1053      History   Chief Complaint Chief Complaint  Patient presents with   Generalized Body Aches    HPI Osceola Depaz is a 81 y.o. male.   Pleasant 81 year old male presents today with his wife due to concerns of an acute onset of chills and shaking this morning.  Patient does have an underlying history of dementia for which the history of present illness presented by his wife.  She states that he woke up early this morning with chills.  He had no fever.  He has an underlying tremor but she states he looked like he was shaking more than normal.  He did have influenza 1 month ago, but got over it without any significant issues.  His wife was concerned regarding his shaking and called EMS this morning.  They arrived to his house and did an EKG, glucose, and vitals and stated everything looked fine and left.  Due to concerns with ongoing symptoms, she brought him here.  Upon questioning, patient states overall he feels fine.  He has a very mild intermittent cough and states that he "always shakes."  Wife also feels that he has had increased urination since this morning.  He has had no recent changes to his medications. He denies any change to his appetite and reports eating grits and eggs this morning.  Patient has generalized weakness, he reports this is unchanged from his baseline.  He typically walks with a walker but brought only his cane to our office today.  Unfortunately while being triaged, he tried to rush over to get his cane and tripped and fell.  He fell forward, landing on his hands, but his jaw hit the side table.  Patient denies any pain, swelling, or issues with opening and closing his jaw.  He denies any laceration or cut to the skin.    Past Medical History:  Diagnosis Date   Diabetes mellitus    insulin dependent   Heart disease, unspecified    Hypercholesterolemia    Hypertension    Sarcoidosis     Unspecified glaucoma(365.9)     Patient Active Problem List   Diagnosis Date Noted   Fever in adult 10/26/2017   Bladder cancer ([place]) 10/26/2017   Mediastinal lymphadenopathy 10/26/2017   Polyarthralgia 10/26/2017   Effusion of left knee joint 10/26/2017   Angioedema 02/01/2014   Essential hypertension 02/01/2014   Type 2 diabetes mellitus with diabetic nephropathy ([place]) 02/01/2014   Hyperlipemia 02/01/2014   Pure hypercholesterolemia 12/08/2012   Type 2 diabetes mellitus with other specified complication ([place]) 26/20/3559   Unspecified essential hypertension 12/06/2012   Prostate cancer ([place]) 04/30/2011    Past Surgical History:  Procedure Laterality Date   CATARACT EXTRACTION     knee replacement surgery     right       Home Medications    Prior to Admission medications   Medication Sig Start Date End Date Taking? Authorizing Provider  ACCU-CHEK AVIVA PLUS test strip USE AS INSTRUCTED TO CHECK BLOOD SUGAR THREE TIMES DAILY 11/11/16   [name], MD  acetaminophen (TYLENOL) 325 MG tablet Take 2 tablets (650 mg total) by mouth every 6 (six) hours as needed for mild pain, fever or headache. 10/30/17   Denton Brick, [name], MD  albuterol (VENTOLIN HFA) 108 (90 Base) MCG/ACT inhaler Inhale 1-2 puffs into the lungs every 4 (four) hours as needed for wheezing or shortness of  breath. 05/22/20   [name], MD  amLODipine (NORVASC) 10 MG tablet Take 1 tablet (10 mg total) by mouth daily. 10/30/17   [name], MD  aspirin 81 MG tablet Take 81 mg by mouth at bedtime.     [provider]  atorvastatin (LIPITOR) 20 MG tablet Take 20 mg by mouth daily. 12/16/18   [provider]  Blood Glucose Monitoring Suppl (ACCU-CHEK AVIVA PLUS) w/Device KIT Use to check blood sugar 3 times per day dx code E11.65 03/12/16   [name], MD  donepezil (ARICEPT) 10 MG tablet Take 1 tablet (10 mg total) by mouth at bedtime. 10/30/17   [name], MD  dorzolamide-timolol  (COSOPT) 22.3-6.8 MG/ML ophthalmic solution Place 1 drop into both eyes 2 (two) times daily.     [provider]  escitalopram (LEXAPRO) 5 MG tablet Take 1 tablet (5 mg total) by mouth at bedtime. 10/30/17   [name], MD  ferrous sulfate 325 (65 FE) MG tablet Take 1 tablet (325 mg total) by mouth 2 (two) times daily. 10/30/17   [name], MD  fish oil-omega-3 fatty acids 1000 MG capsule Take 1 g by mouth daily.     [provider]  hydroxypropyl methylcellulose / hypromellose (ISOPTO TEARS / GONIOVISC) 2.5 % ophthalmic solution Place 1 drop into both eyes 4 (four) times daily as needed for dry eyes.     [provider]  indomethacin (INDOCIN) 50 MG capsule Take 1 capsule (50 mg total) by mouth 2 (two) times daily as needed (gout). Take with food 12/24/19   [name], NP  Insulin Detemir (LEVEMIR FLEXTOUCH) 100 UNIT/ML Pen Inject 26 units daily 10/30/17   [name], [name], MD  insulin regular (NOVOLIN R RELION) 100 units/mL injection Inject 0.05-0.15 mLs (5-15 Units total) into the skin 3 (three) times daily with meals as needed for high blood sugar (per sliding scale). 10/30/17   [name], MD  Insulin Syringe-Needle U-100 (INSULIN SYRINGE .5CC/31GX5/16") 31G X 5/16" 0.5 ML MISC Use one to inject insulin daily 05/02/15   [name], MD  latanoprost (XALATAN) 0.005 % ophthalmic solution Place 1 drop into both eyes at bedtime.  02/17/14   [provider]  liraglutide (VICTOZA) 18 MG/3ML SOPN Inject 0.2 mLs (1.2 mg total) into the skin daily. 10/30/17   [name], MD  meclizine (ANTIVERT) 25 MG tablet Take 1 tablet (25 mg total) by mouth 3 (three) times daily as needed for dizziness. 07/10/00   [name], MD  metFORMIN (GLUCOPHAGE) 1000 MG tablet Take 1 tablet (1,000 mg total) by mouth 2 (two) times daily with a meal. 10/30/17   [name], [name], MD  metoprolol tartrate (LOPRESSOR) 25 MG tablet Take 1 tablet (25 mg total) by mouth 2 (two) times  daily. 10/30/17   [name], MD  PREDNISOLONE ACETATE OP Place 1 drop into the right eye daily.    [provider]  Spacer/Aero-Holding Chambers (AEROCHAMBER PLUS) inhaler Use with inhaler 05/22/20   [name], MD  tamsulosin (FLOMAX) 0.4 MG CAPS capsule Take 1 capsule (0.4 mg total) by mouth at bedtime. 10/30/17   [name], MD    Family History Family History  Problem Relation Age of Onset   Hypertension Mother    Diabetes Mellitus I Mother    Hypertension Father    Coronary artery disease Father    Hypertension Sister    Hypertension Brother    Coronary artery disease Brother    Diabetes Mellitus I Brother  Diabetes Mellitus I Brother    Cancer Neg Hx     Social History Social History   Tobacco Use   Smoking status: Former    Packs/day: 0.50    Types: Cigarettes    Quit date: 04/21/2005    Years since quitting: 16.0   Smokeless tobacco: Never  Substance Use Topics   Alcohol use: No   Drug use: No     Allergies   Ace inhibitors and Allopurinol   Review of Systems Review of Systems  Constitutional:  Positive for chills (with shaking) and fatigue (chronic, unchanged). Negative for activity change, appetite change, diaphoresis and fever.  HENT:  Negative for congestion, dental problem, mouth sores, rhinorrhea, sinus pressure, sneezing, sore throat and trouble swallowing.   Eyes:  Negative for discharge and itching.  Respiratory:  Negative for chest tightness, shortness of breath, wheezing and stridor.   Cardiovascular:  Negative for chest pain, palpitations and leg swelling.  Gastrointestinal:  Negative for abdominal distention, abdominal pain, anal bleeding, constipation and diarrhea.  Endocrine: Polyuria: per wife - this morning only.  Genitourinary:  Positive for frequency (per wife this morning only). Negative for enuresis, genital sores and hematuria.  Musculoskeletal:  Positive for gait problem (chronic, unchanged).  Neurological:   Positive for tremors (chronic, unchanged) and weakness (chronic, unchanged).  Psychiatric/Behavioral:  Positive for confusion (hx dementia, chronic and unchanged).     Physical Exam Triage Vital Signs ED Triage Vitals  Enc Vitals Group     BP 04/28/21 1231 (!) 163/81     Pulse Rate 04/28/21 1231 89     Resp 04/28/21 1231 18     Temp 04/28/21 1231 98.3 F (36.8 C)     Temp Source 04/28/21 1231 Oral     SpO2 04/28/21 1231 94 %     Weight --      Height --      Head Circumference --      Peak Flow --      Pain Score 04/28/21 1400 6     Pain Loc --      Pain Edu? --      Excl. in [place]? --    No data found.  Updated Vital Signs BP (!) 163/81 (BP Location: Left Arm)    Pulse 89    Temp 98.3 F (36.8 C) (Oral)    Resp 18    SpO2 94%   Visual Acuity Right Eye Distance:   Left Eye Distance:   Bilateral Distance:    Right Eye Near:   Left Eye Near:    Bilateral Near:     Physical Exam Vitals and nursing note reviewed. Exam conducted with a chaperone present.  Constitutional:      General: He is not in acute distress.    Appearance: He is well-developed and normal weight. He is not ill-appearing or toxic-appearing.     Comments: Chronically ill  HENT:     Head: Normocephalic and atraumatic.     Right Ear: Tympanic membrane, ear canal and external ear normal.     Left Ear: Tympanic membrane, ear canal and external ear normal.     Nose: Nose normal. No congestion or rhinorrhea.     Mouth/Throat:     Mouth: Mucous membranes are moist.     Pharynx: No oropharyngeal exudate or posterior oropharyngeal erythema.  Eyes:     General:        Right eye: Discharge present.        Left eye: Discharge  present.    Extraocular Movements: Extraocular movements intact.     Conjunctiva/sclera: Conjunctivae normal.     Pupils: Pupils are equal, round, and reactive to light.  Cardiovascular:     Rate and Rhythm: Normal rate and regular rhythm.     Pulses: Normal pulses.     Heart sounds:  Normal heart sounds. No murmur heard.   No friction rub. No gallop.  Pulmonary:     Effort: Pulmonary effort is normal. No respiratory distress.     Breath sounds: Normal breath sounds. No stridor. No wheezing, rhonchi or rales.  Chest:     Chest wall: No tenderness.  Abdominal:     General: Abdomen is flat. Bowel sounds are normal. There is no distension.     Palpations: Abdomen is soft. There is no mass.     Tenderness: There is no abdominal tenderness. There is no right CVA tenderness, left CVA tenderness, guarding or rebound.     Hernia: No hernia is present.  Musculoskeletal:        General: No swelling.     Cervical back: Normal range of motion and neck supple. No rigidity or tenderness.     Right lower leg: No edema.     Left lower leg: No edema.     Comments: Pt with generalized weakness bilaterally, at his baseline level. Inspection of the jaw reveals to evidence of dislocation, fracture, swelling or bruising. Normal opening and closing without pain. No abrasion, bruising or laceration to the skin.  Lymphadenopathy:     Cervical: No cervical adenopathy.  Skin:    General: Skin is warm and dry.     Capillary Refill: Capillary refill takes less than 2 seconds.  Neurological:     Mental Status: He is alert.     Gait: Gait abnormal (at pts baseline - uses cane).     Comments: Resting tremor, worse with intention  Psychiatric:        Mood and Affect: Mood normal.     UC Treatments / Results  Labs (all labs ordered are listed, but only abnormal results are displayed) Labs Reviewed  POCT URINALYSIS DIPSTICK, ED / UC - Abnormal; Notable for the following components:      Result Value   Ketones, ur TRACE (*)    Protein, ur 100 (*)    All other components within normal limits  SARS CORONAVIRUS 2 (TAT 6-24 HRS)  POC INFLUENZA A AND B ANTIGEN (URGENT CARE ONLY)  POC INFLUENZA A AND B ANTIGEN (URGENT CARE ONLY)    EKG   Radiology DG Chest 2 View  Result Date:  04/28/2021 CLINICAL DATA:  Chills and cough. EXAM: CHEST - 2 VIEW COMPARISON:  March 24, 2021 FINDINGS: The heart size and mediastinal contours are within normal limits. Both lungs are clear. The visualized skeletal structures are unremarkable. IMPRESSION: No active cardiopulmonary disease. Electronically Signed   By: Dorise Bullion III M.D.   On: 04/28/2021 13:12    Procedures Procedures (including critical care time)  Medications Ordered in UC Medications - No data to display  Initial Impression / Assessment and Plan / UC Course  I have reviewed the triage vital signs and the nursing notes.  Pertinent labs & imaging results that were available during my care of the patient were reviewed by me and considered in my medical decision making (see chart for details).     C/o chills - apart from BP, vitals normal in office. Flu test negative, UA unremarkable, CXR negative.  Pt had EKG and glucose testing earlier prior to presentation by EMS which was also normal.  Weakness - pt is chronically weak and deconditioned. I see nothing on exam that would be exacerbating his normal asthenia. I recommend pt call his PCP tomorrow to possibly obtain labs should his covid test be negative and his sx persist to evaluate for additional causes. Meds reviewed - pt is no longer taking meclizine. Fall - occurred in clinic today as pt was rushing to grab cane and tripped on exam table. No evidence of trauma to his jaw.   Final Clinical Impressions(s) / UC Diagnoses   Final diagnoses:  Chills (without fever)  Generalized weakness  Fall, initial encounter     Discharge Instructions      Your flu test is negative. We will call with results of the covid test once results obtained. Your chest xray is negative. Your urine is clear. It is uncertain the cause of your chills, but there is no clear cut signs of infection in office today. Your vitals look stable. Your jaw shows no evidence of acute injury. Please  monitor for any changes and return to clinic if any new concerns arise. Please follow up with your PCP this week for a follow up and recheck.     ED Prescriptions   None    PDMP not reviewed this encounter.   Chaney Malling, [place] 04/28/21 1428

## 2021-04-28 NOTE — ED Notes (Signed)
Pt was ambulating across room to put coat on bedside table when didn't see the step to treatment table/chair and tripped over it causing him to fall forward into the bedside table. Pt did hit chin on bedside table. Pt had no LOC, no bleeding, injuries or traumas assessed from fall. Pt assisted off floor into wheelchair by this RN, Laurence Aly and Janett Billow, RT.  Whitney PA made aware.

## 2021-04-30 ENCOUNTER — Ambulatory Visit: Payer: Self-pay

## 2021-05-01 ENCOUNTER — Other Ambulatory Visit: Payer: Self-pay

## 2021-05-01 NOTE — Patient Outreach (Signed)
[facility]) Care Management  05/01/2021  Vicki Pasqual [birth year] [id number]   Case Closure   Patient's PCP office to provide CM services via [employer]. Patient to be enrolled in their services and will receive ongoing CM services.   Plan: RN CM will close case.   Enzo Montgomery, RN,BSN,CCM [facility] Telephonic Care Management Coordinator Direct Phone: [phone] Toll Free: [phone] Fax: [phone]

## 2021-05-21 DIAGNOSIS — N3281 Overactive bladder: Secondary | ICD-10-CM | POA: Diagnosis not present

## 2021-05-21 DIAGNOSIS — N401 Enlarged prostate with lower urinary tract symptoms: Secondary | ICD-10-CM | POA: Diagnosis not present

## 2021-05-21 DIAGNOSIS — R3915 Urgency of urination: Secondary | ICD-10-CM | POA: Diagnosis not present

## 2021-05-22 DIAGNOSIS — H26491 Other secondary cataract, right eye: Secondary | ICD-10-CM | POA: Diagnosis not present

## 2021-05-22 DIAGNOSIS — E119 Type 2 diabetes mellitus without complications: Secondary | ICD-10-CM | POA: Diagnosis not present

## 2021-05-22 DIAGNOSIS — Z961 Presence of intraocular lens: Secondary | ICD-10-CM | POA: Diagnosis not present

## 2021-05-22 DIAGNOSIS — H401112 Primary open-angle glaucoma, right eye, moderate stage: Secondary | ICD-10-CM | POA: Diagnosis not present

## 2021-05-22 DIAGNOSIS — H401123 Primary open-angle glaucoma, left eye, severe stage: Secondary | ICD-10-CM | POA: Diagnosis not present

## 2021-05-22 DIAGNOSIS — Z794 Long term (current) use of insulin: Secondary | ICD-10-CM | POA: Diagnosis not present

## 2021-06-05 ENCOUNTER — Ambulatory Visit: Payer: Medicare HMO | Admitting: Psychiatry

## 2021-06-05 ENCOUNTER — Other Ambulatory Visit: Payer: Self-pay

## 2021-06-05 ENCOUNTER — Encounter: Payer: Self-pay | Admitting: *Deleted

## 2021-06-05 VITALS — BP 136/75 | HR 84 | Ht 69.0 in | Wt 184.6 lb

## 2021-06-05 DIAGNOSIS — R0683 Snoring: Secondary | ICD-10-CM

## 2021-06-05 DIAGNOSIS — G20 Parkinson's disease: Secondary | ICD-10-CM | POA: Diagnosis not present

## 2021-06-05 DIAGNOSIS — R413 Other amnesia: Secondary | ICD-10-CM

## 2021-06-05 MED ORDER — MEMANTINE HCL 28 X 5 MG & 21 X 10 MG PO TABS: ORAL_TABLET | ORAL | 0 refills | Status: DC

## 2021-06-05 MED ORDER — MEMANTINE HCL 10 MG PO TABS: 10.0000 mg | ORAL_TABLET | Freq: Two times a day (BID) | ORAL | 3 refills | Status: DC

## 2021-06-05 NOTE — Progress Notes (Signed)
[facility]  PATIENT: [name] DOB: Nov 01, 1940  REFERRING CLINICIAN: [name], MD HISTORY FROM: self, wife REASON FOR VISIT: memory loss   HISTORICAL  CHIEF COMPLAINT:  Chief Complaint  Patient presents with   Memory Loss    Rm 8 New Pt, wife- [name]  MMSE 19    HISTORY OF PRESENT ILLNESS:  The patient presents for evaluation of memory loss which has been present for over one year. Feels it has not changed much over a year. Will forget to do chores like taking the trash out and will forget where he is supposed to be going when he is driving. Will forget conversations he had earlier that day. Long term memory is intact.  He has been taking donepezil 10 mg daily for the past year. Has not noticed much of a difference.  No falls over the past year.  TBI:  No past history of TBI Stroke:  no past history of stroke Seizures:  no past history of seizures Sleep: Doesn't sleep well at night. Snores at night and has stopped breathing at night. Sleeps frequently during the day. Mood: Patient takes Lexapro 5 mg QHS. He denies anxiety and depression. Wife states he seems more withdrawn lately.   Functional status:  Patient lives with his wife. Daughter visits every day Cooking: Wife does most of the cooking Cleaning: Forgets to take trash out Shopping: wife does the grocery shopping Driving: Stopped driving Bills: Wife has always handled the finances Medications: Does not forget to take medications Forgetting loved ones names?: no Word finding difficulty? yes  OTHER MEDICAL CONDITIONS: HTN, HLD, diabetes   REVIEW OF SYSTEMS: Full 14 system review of systems performed and negative with exception of: memory loss  ALLERGIES: Allergies  Allergen Reactions   Ace Inhibitors Other (See Comments)    angioedema   Allopurinol Rash    HOME MEDICATIONS: Outpatient Medications Prior to Visit  Medication Sig Dispense Refill   ACCU-CHEK AVIVA PLUS test  strip USE AS INSTRUCTED TO CHECK BLOOD SUGAR THREE TIMES DAILY 300 each 1   acetaminophen (TYLENOL) 325 MG tablet Take 2 tablets (650 mg total) by mouth every 6 (six) hours as needed for mild pain, fever or headache. 20 tablet 1   amLODipine (NORVASC) 10 MG tablet Take 1 tablet (10 mg total) by mouth daily. 30 tablet 1   aspirin 81 MG tablet Take 81 mg by mouth at bedtime.      atorvastatin (LIPITOR) 20 MG tablet Take 20 mg by mouth daily.     Blood Glucose Monitoring Suppl (ACCU-CHEK AVIVA PLUS) w/Device KIT Use to check blood sugar 3 times per day dx code E11.65 1 kit 0   donepezil (ARICEPT) 10 MG tablet Take 1 tablet (10 mg total) by mouth at bedtime. 30 tablet 1   dorzolamide-timolol (COSOPT) 22.3-6.8 MG/ML ophthalmic solution Place 1 drop into both eyes 2 (two) times daily.      escitalopram (LEXAPRO) 5 MG tablet Take 1 tablet (5 mg total) by mouth at bedtime. 30 tablet 1   ferrous sulfate 325 (65 FE) MG tablet Take 1 tablet (325 mg total) by mouth 2 (two) times daily. 60 tablet 3   fish oil-omega-3 fatty acids 1000 MG capsule Take 1 g by mouth daily.      hydroxypropyl methylcellulose / hypromellose (ISOPTO TEARS / GONIOVISC) 2.5 % ophthalmic solution Place 1 drop into both eyes 4 (four) times daily as needed for dry eyes.      indomethacin (INDOCIN) 50  MG capsule Take 1 capsule (50 mg total) by mouth 2 (two) times daily as needed (gout). Take with food 20 capsule 1   insulin regular (NOVOLIN R RELION) 100 units/mL injection Inject 0.05-0.15 mLs (5-15 Units total) into the skin 3 (three) times daily with meals as needed for high blood sugar (per sliding scale). 10 mL 2   Insulin Syringe-Needle U-100 (INSULIN SYRINGE .5CC/31GX5/16") 31G X 5/16" 0.5 ML MISC Use one to inject insulin daily 30 each 1   latanoprost (XALATAN) 0.005 % ophthalmic solution Place 1 drop into both eyes at bedtime.      meclizine (ANTIVERT) 25 MG tablet Take 1 tablet (25 mg total) by mouth 3 (three) times daily as needed  for dizziness. 30 tablet 0   metFORMIN (GLUCOPHAGE) 1000 MG tablet Take 1 tablet (1,000 mg total) by mouth 2 (two) times daily with a meal. 60 tablet 2   metoprolol tartrate (LOPRESSOR) 25 MG tablet Take 1 tablet (25 mg total) by mouth 2 (two) times daily. 60 tablet 2   PREDNISOLONE ACETATE OP Place 1 drop into the right eye daily.     Spacer/Aero-Holding Chambers (AEROCHAMBER PLUS) inhaler Use with inhaler 1 each 2   tamsulosin (FLOMAX) 0.4 MG CAPS capsule Take 1 capsule (0.4 mg total) by mouth at bedtime. 30 capsule 1   albuterol (VENTOLIN HFA) 108 (90 Base) MCG/ACT inhaler Inhale 1-2 puffs into the lungs every 4 (four) hours as needed for wheezing or shortness of breath. (Patient not taking: Reported on 06/05/2021) 1 each 0   Insulin Detemir (LEVEMIR FLEXTOUCH) 100 UNIT/ML Pen Inject 26 units daily (Patient not taking: Reported on 06/05/2021) 20 pen 1   liraglutide (VICTOZA) 18 MG/3ML SOPN Inject 0.2 mLs (1.2 mg total) into the skin daily. (Patient not taking: Reported on 06/05/2021) 15 pen 1   No facility-administered medications prior to visit.    PAST MEDICAL HISTORY: Past Medical History:  Diagnosis Date   Bladder cancer ([place])    in situ   Cataracts, bilateral    Diabetes mellitus    insulin dependent   Gout    Hearing loss    Heart disease, unspecified    Hypercholesterolemia    Hypertension    Memory changes    Prostate cancer ([place])    s/p radiation   Sarcoidosis    Unspecified glaucoma(365.9)     PAST SURGICAL HISTORY: Past Surgical History:  Procedure Laterality Date   BLADDER SURGERY     for carcinoma   CATARACT EXTRACTION Bilateral 2011   knee replacement surgery  1999   right   PROSTATECTOMY  2013    FAMILY HISTORY: Family History  Problem Relation Age of Onset   Hypertension Mother    Diabetes Mellitus I Mother    Hypertension Father    Coronary artery disease Father    Hypertension Sister    Hypertension Brother    Coronary artery disease Brother     Diabetes Mellitus I Brother    Diabetes Mellitus I Brother    Cancer Neg Hx     SOCIAL HISTORY: Social History   Socioeconomic History   Marital status: Married    Spouse name: Opal Sidles   Number of children: 3   Years of education: 9   Highest education level: 9th grade  Occupational History    Comment: retired Research scientist (medical)  Tobacco Use   Smoking status: Former    Packs/day: 0.50    Types: Cigarettes    Quit date: 04/21/2005    Years  since quitting: 16.1   Smokeless tobacco: Never  Substance and Sexual Activity   Alcohol use: No   Drug use: No   Sexual activity: Not Currently  Other Topics Concern   Not on file  Social History Narrative   06/05/21 Lives with wife   Social Determinants of Health   Financial Resource Strain: Not on file  Food Insecurity: No Food Insecurity   Worried About Charity fundraiser in the Last Year: Never true   Ran Out of Food in the Last Year: Never true  Transportation Needs: No Transportation Needs   Lack of Transportation (Medical): No   Lack of Transportation (Non-Medical): No  Physical Activity: Not on file  Stress: Not on file  Social Connections: Not on file  Intimate Partner Violence: Not on file     PHYSICAL EXAM  GENERAL EXAM/CONSTITUTIONAL: Vitals:  Vitals:   06/05/21 0924  BP: 136/75  Pulse: 84  Weight: 184 lb 9.6 oz (83.7 kg)  Height: 5' 9"  (1.753 m)   Body mass index is 27.26 kg/m. Wt Readings from Last 3 Encounters:  06/05/21 184 lb 9.6 oz (83.7 kg)  01/17/20 181 lb (82.1 kg)  01/04/19 183 lb (83 kg)   Patient is in no distress; well developed, nourished and groomed; neck is supple  CARDIOVASCULAR: Examination of peripheral vascular system by observation and palpation is normal  EYES: Pupils round and reactive to light, Visual fields full to confrontation, Extraocular movements intact  MUSCULOSKELETAL: Gait, strength, tone, movements noted in Neurologic exam below  NEUROLOGIC: MENTAL STATUS:  MMSE - [place] Exam 06/05/2021  Orientation to time 4  Orientation to Place 4  Registration 3  Attention/ Calculation 0  Recall 0  Language- name 2 objects 2  Language- repeat 0  Language- follow 3 step command 3  Language- read & follow direction 1  Language-read & follow direction-comments I had to read it to him  Write a sentence 0  Copy design 0  Total score 17    CRANIAL NERVE:  2nd, 3rd, 4th, 6th - pupils equal and reactive to light, visual fields full to confrontation, extraocular muscles intact, no nystagmus 5th - facial sensation symmetric 7th - facial strength symmetric 8th - hearing intact 9th - palate elevates symmetrically, uvula midline 11th - shoulder shrug symmetric 12th - tongue protrusion midline  MOTOR:  normal bulk and tone, some cogwheeling of LUE, full strength in the BUE, BLE  SENSORY:  normal and symmetric to light touch all 4 extremities  COORDINATION:  finger-nose-finger intact bilaterally, resting tremor bilateral upper extremities, bradykinesia on finger tapping R>L  REFLEXES:  deep tendon reflexes present and symmetric  GAIT/STATION:  Shuffling gait, stooped posture with decreased arm swing     DIAGNOSTIC DATA (LABS, IMAGING, TESTING) - I reviewed patient records, labs, notes, testing and imaging myself where available.  Lab Results  Component Value Date   WBC 5.1 03/24/2021   HGB 12.6 (L) 03/24/2021   HCT 38.5 (L) 03/24/2021   MCV 78.7 (L) 03/24/2021   PLT 179 03/24/2021      Component Value Date/Time   NA 141 03/24/2021 1057   K 3.5 03/24/2021 1057   CL 105 03/24/2021 1057   CO2 27 03/24/2021 1057   GLUCOSE 163 (H) 03/24/2021 1057   BUN 13 03/24/2021 1057   CREATININE 1.06 03/24/2021 1057   CALCIUM 9.0 03/24/2021 1057   PROT 8.2 (H) 03/24/2021 1057   ALBUMIN 4.0 03/24/2021 1057   AST 25 03/24/2021  1057   ALT 24 03/24/2021 1057   ALKPHOS 64 03/24/2021 1057   BILITOT 0.8 03/24/2021 1057   GFRNONAA >60 03/24/2021 1057    GFRAA >60 10/30/2017 0437   Lab Results  Component Value Date   CHOL 137 09/22/2016   HDL 37.30 (L) 09/22/2016   LDLCALC 81 09/22/2016   TRIG 89.0 09/22/2016   CHOLHDL 4 09/22/2016   Lab Results  Component Value Date   HGBA1C 10.0 (H) 10/26/2017   Lab Results  Component Value Date   VITAMINB12 296 12/29/2010   No results found for: TSH  MRI brain 2018: chronic microvascular changes    ASSESSMENT AND PLAN  81 y.o. male with a history of HTN, HLD, diabetes who presents for evaluation of memory loss over the past year. MMSE today is 17/30 consistent with moderate cognitive impairment. His MRI from 2018 shows chronic microvascular changes which are likely contributing to his memory issues. He also has signs of parkinsonism on his exam today, which can be associated with dementia. Discussed starting medication to help with his motor symptoms, but he would prefer to focus on memory medications first. Will continue donepezil for now and add Namenda. We will discuss Parkinson's symptoms further at our next appointment.  His wife also notes that he snores at night and sometimes stops breathing. He will sleep for a long time, getting up in the morning and then immediately going back to sleep. Denies acting out dreams. Will refer to Sleep for evaluation of OSA and possible REM behavior disorder given parkinsonism on exam.   1. Memory loss       PLAN: - Labs: TSH, B12 - Continue donepezil 10 mg daily. Add Namenda 5 mg daily, increase by 5 mg each week up to 10 mg BID - Referral to Sleep for evaluation of OSA and REM behavior disorder - Will discuss parkinsonism at our next appointment  Orders Placed This Encounter  Procedures   Vitamin B12   TSH   Ambulatory referral to Neurology    Meds ordered this encounter  Medications   memantine (NAMENDA TITRATION PAK) tablet pack    Sig: 5 mg/day for =1 week; 5 mg twice daily for =1 week; 15 mg/day given in 5 mg and 10 mg separated  doses for =1 week; then 10 mg twice daily    Dispense:  49 tablet    Refill:  0   memantine (NAMENDA) 10 MG tablet    Sig: Take 1 tablet (10 mg total) by mouth 2 (two) times daily.    Dispense:  60 tablet    Refill:  3    Return in about 3 months (around 09/02/2021).  I spent an average of 40 minutes chart reviewing and counseling the patient, with at least 50% of the time face to face with the patient.   Genia Harold, MD 06/05/21 10:17 AM  [facility] [address], [place], [place] [address] [phone]

## 2021-06-05 NOTE — Patient Instructions (Addendum)
Referral to for Sleep apnea evaluation Start Namenda (this is a medication for your memory)  -watch for headache or dizziness week one:  take 1/2 tablet (5mg ) in the morning  week two:  take 1/2 tablet (5mg ) in the morning and take 1/2 tablet (5mg ) in the evening week three: take 1 tablet (10mg ) in the morning and take 1/2 tablet (5mg ) in the evening week four:  take 1 tablet (10mg ) in the morning and take 1 tablet (10mg ) in the evening  -this is the full dose

## 2021-06-06 ENCOUNTER — Telehealth: Payer: Self-pay

## 2021-06-06 LAB — VITAMIN B12: Vitamin B-12: 821 pg/mL (ref 232–1245)

## 2021-06-06 LAB — TSH: TSH: 4.31 u[IU]/mL (ref 0.450–4.500)

## 2021-06-06 NOTE — Telephone Encounter (Signed)
Contacted pt, pt gave me verbal consent to speak wife as there is no DPR signed. Wife was upset that I had to verify it was ok to speak to her. Informed them labs was normal and to please sign DPR at next visit. Pt was appreciative.

## 2021-06-06 NOTE — Telephone Encounter (Signed)
-----   Message from Genia Harold, MD sent at 06/06/2021  8:18 AM EST ----- Blood work is normal

## 2021-06-18 DIAGNOSIS — R350 Frequency of micturition: Secondary | ICD-10-CM | POA: Diagnosis not present

## 2021-06-18 DIAGNOSIS — N3281 Overactive bladder: Secondary | ICD-10-CM | POA: Diagnosis not present

## 2021-06-18 DIAGNOSIS — R3915 Urgency of urination: Secondary | ICD-10-CM | POA: Diagnosis not present

## 2021-06-24 ENCOUNTER — Other Ambulatory Visit: Payer: Self-pay

## 2021-06-24 NOTE — Patient Outreach (Signed)
[facility]) Care Management ? ?06/24/2021 ? ?Kean Gautreau ?1940-12-21 ?[id number] ? ? ?Telephone Assessment ? ? ?Unsuccessful outreach attempt patient. No answer after multiple rings.  ? ? ? ? ?Plan: ?RN CM will make outreach attempt within the month of April. ? ?Enzo Montgomery, RN,BSN,CCM ?[facility] Care Management ?Telephonic Care Management Coordinator ?Direct Phone: [phone] ?Toll Free: [phone] ?Fax: [phone] ? ?

## 2021-06-24 NOTE — Patient Outreach (Signed)
[facility] ([facility]) Care Management ° °06/24/2021 ° °[name] °10/28/1940 °[id number] ° ° °Telephone Assessment ° °Voicemail message received form spouse. Return call placed to the home. Spoke briefly with spouse who reported they were eating lunch. She share that patient is doing well. No new issues or concerns. He foes for PCP appt on tomorrow. He recently saw neuro MD who thinks patient may possibly have sleep apnea. He is to be scheduled for sleep study soon. Patient's appetite remains good. Wgt stable. Blood sugars WNL. Denies any recent falls. No RN CM needs or concerns at this time.  ° °Medications Reviewed Today   ° ° Reviewed by ,  Jeanette, RN (Registered Nurse) on 06/24/21 at 1218  Med List Status: <None>  ° °Medication Order Taking? Sig Documenting Provider Last Dose Status Informant  °ACCU-CHEK AVIVA PLUS test strip [id number] No USE AS INSTRUCTED TO CHECK BLOOD SUGAR THREE TIMES DAILY Kumar, Ajay, MD Taking Active Family Member  °acetaminophen (TYLENOL) 325 MG tablet [id number] No Take 2 tablets (650 mg total) by mouth every 6 (six) hours as needed for mild pain, fever or headache. [name], [name], MD Taking Active   °albuterol (VENTOLIN HFA) 108 (90 Base) MCG/ACT inhaler [id number] No Inhale 1-2 puffs into the lungs every 4 (four) hours as needed for wheezing or shortness of breath.  °Patient not taking: Reported on 06/05/2021  ° Mortenson, Ashley, MD Not Taking Active   °amLODipine (NORVASC) 10 MG tablet [id number] No Take 1 tablet (10 mg total) by mouth daily. [name], [name], MD Taking Active   °aspirin 81 MG tablet [id number] No Take 81 mg by mouth at bedtime.  [provider] Taking Active Family Member  °atorvastatin (LIPITOR) 20 MG tablet [id number] No Take 20 mg by mouth daily. [provider] Taking Active   °Blood Glucose Monitoring Suppl (ACCU-CHEK AVIVA PLUS) w/Device KIT [id number] No Use to check blood sugar 3 times per day dx code E11.65 Kumar,  Ajay, MD Taking Active Family Member  °donepezil (ARICEPT) 10 MG tablet [id number] No Take 1 tablet (10 mg total) by mouth at bedtime. [name], [name], MD Taking Active   °dorzolamide-timolol (COSOPT) 22.3-6.8 MG/ML ophthalmic solution [id number] No Place 1 drop into both eyes 2 (two) times daily.  [provider] Taking Active Family Member  °escitalopram (LEXAPRO) 5 MG tablet [id number] No Take 1 tablet (5 mg total) by mouth at bedtime. [name], [name], MD Taking Active   °ferrous sulfate 325 (65 FE) MG tablet [id number] No Take 1 tablet (325 mg total) by mouth 2 (two) times daily. [name], [name], MD Taking Active   °fish oil-omega-3 fatty acids 1000 MG capsule [id number] No Take 1 g by mouth daily.  [provider] Taking Active Family Member  °hydroxypropyl methylcellulose / hypromellose (ISOPTO TEARS / GONIOVISC) 2.5 % ophthalmic solution [id number] No Place 1 drop into both eyes 4 (four) times daily as needed for dry eyes.  [provider] Taking Active Family Member  °indomethacin (INDOCIN) 50 MG capsule [id number] No Take 1 capsule (50 mg total) by mouth 2 (two) times daily as needed (gout). Take with food Bast, Traci A, NP Taking Active   °Insulin Detemir (LEVEMIR FLEXTOUCH) 100 UNIT/ML Pen [id number] No Inject 26 units daily  °Patient not taking: Reported on 06/05/2021  ° [name], [name], MD Not Taking Active   °insulin regular (NOVOLIN R RELION) 100 units/mL injection [id number] No Inject 0.05-0.15 mLs (5-15 Units total) into the skin 3 (three) times daily with meals as needed for high blood   sugar (per sliding scale). [name], [name], MD Taking Active   °Insulin Syringe-Needle U-100 (INSULIN SYRINGE .5CC/31GX5/16") 31G X 5/16" 0.5 ML MISC [id number] No Use one to inject insulin daily Kumar, Ajay, MD Taking Active Family Member  °latanoprost (XALATAN) 0.005 % ophthalmic solution [id number] No Place 1 drop into both eyes at bedtime.  [provider] Taking Active Family Member   °         °Med Note (HARDEN, SUMMER D   Mon Oct 26, 2017  5:16 PM)    °liraglutide (VICTOZA) 18 MG/3ML SOPN [id number] No Inject 0.2 mLs (1.2 mg total) into the skin daily.  °Patient not taking: Reported on 06/05/2021  ° [name], [name], MD Not Taking Active   °meclizine (ANTIVERT) 25 MG tablet [id number] No Take 1 tablet (25 mg total) by mouth 3 (three) times daily as needed for dizziness. Glick, David, MD Taking Active Family Member  °memantine (NAMENDA TITRATION PAK) tablet pack [id number]  5 mg/day for =1 week; 5 mg twice daily for =1 week; 15 mg/day given in 5 mg and 10 mg separated doses for =1 week; then 10 mg twice daily Chima, Jennifer, MD  Active   °memantine (NAMENDA) 10 MG tablet [id number]  Take 1 tablet (10 mg total) by mouth 2 (two) times daily. Chima, Jennifer, MD  Active   °metFORMIN (GLUCOPHAGE) 1000 MG tablet [id number] No Take 1 tablet (1,000 mg total) by mouth 2 (two) times daily with a meal. [name], [name], MD Taking Active   °         °Med Note (SUMME, [name]   Wed Nov 18, 2017  2:07 PM)    °metoprolol tartrate (LOPRESSOR) 25 MG tablet [id number] No Take 1 tablet (25 mg total) by mouth 2 (two) times daily. [name], [name], MD Taking Active   °PREDNISOLONE ACETATE OP [id number] No Place 1 drop into the right eye daily. [provider] Taking Active Family Member  °         °Med Note (MENDOZA MENDEZ, CARLOS A   Tue Jan 04, 2019  8:29 AM)    °Spacer/Aero-Holding Chambers (AEROCHAMBER PLUS) inhaler [id number] No Use with inhaler Mortenson, Ashley, MD Taking Active   °tamsulosin (FLOMAX) 0.4 MG CAPS capsule [id number] No Take 1 capsule (0.4 mg total) by mouth at bedtime. [name], [name], MD Taking Active   ° °  °  ° °  °  °Care Plan : RN Care Manager POC  °Updates made by ,  Jeanette, RN since 06/24/2021 12:00 AM  °  ° °Problem: Chronic Disease Mgmt of Chronic Condition-DM   °Priority: High  °  ° °Long-Range Goal: Development of POC for Mgmt of Chronic Condition-DM   °Start  Date: 04/26/2021  °Expected End Date: 04/26/2022  °This Visit's Progress: On track  °Priority: High  °Note:   ° ° °Current Barriers:  °Chronic Disease Management support and education needs related to DMII  ° °RNCM Clinical Goal(s):  °Patient will verbalize understanding of plan for management of DMII as evidenced by mgmt of chronic condition °demonstrate Improved adherence to prescribed treatment plan for DMII as evidenced by lowering of A1C level °continue to work with RN Care Manager to address care management and care coordination needs related to  DMII as evidenced by adherence to CM Team Scheduled appointments through collaboration with RN Care manager, provider, and care team.  ° °Interventions: °POC sent to PCP upon initial assessment, quarterly and with any changes in patient's conditions °Inter-disciplinary care team collaboration (see longitudinal plan of   care) Evaluation of current treatment plan related to  self management and patient's adherence to plan as established by provider   Diabetes Interventions:  (Status:  Goal on track:  Yes.) Long Term Goal Assessed patient's understanding of A1c goal: <7% Provided education to patient about basic DM disease process Reviewed medications with patient and discussed importance of medication adherence Counseled on importance of regular laboratory monitoring as prescribed Discussed plans with patient for ongoing care management follow up and provided patient with direct contact information for care management team Lab Results  Component Value Date   HGBA1C 10.0 (H) 10/26/2017  04/26/21-Spouse reports patient saw endocrinologist on 02/18/21-A1C level at that time was 8.1-slightly up from previous value of 7.6.  06/24/21-Spouse reports appetite and wgt remains good and stable. Blood sugar this am was 110.  Patient Goals/Self-Care Activities: Take all medications as prescribed Attend all scheduled provider appointments Call provider office for new  concerns or questions  keep appointment with eye doctor check blood sugar at prescribed times: 2-3x/day check feet daily for cuts, sores or redness enter blood sugar readings and medication or insulin into daily log  Follow Up Plan:  Telephone follow up appointment with care management team member scheduled for:  quarterly-within the month of June The patient has been provided with contact information for the care management team and has been advised to call with any health related questions or concerns.        Plan: RN CM discussed with caregiver next outreach within the  month of June.  Caregiver agrees to care plan and follow up.  Enzo Montgomery, RN,BSN,CCM [place] Management Telephonic Care Management Coordinator Direct Phone: [phone] Toll Free: [phone] Fax: [phone]

## 2021-06-25 DIAGNOSIS — I700 Atherosclerosis of aorta: Secondary | ICD-10-CM | POA: Diagnosis not present

## 2021-06-25 DIAGNOSIS — E785 Hyperlipidemia, unspecified: Secondary | ICD-10-CM | POA: Diagnosis not present

## 2021-06-25 DIAGNOSIS — D508 Other iron deficiency anemias: Secondary | ICD-10-CM | POA: Diagnosis not present

## 2021-06-25 DIAGNOSIS — I10 Essential (primary) hypertension: Secondary | ICD-10-CM | POA: Diagnosis not present

## 2021-06-25 DIAGNOSIS — R413 Other amnesia: Secondary | ICD-10-CM | POA: Diagnosis not present

## 2021-06-25 DIAGNOSIS — M109 Gout, unspecified: Secondary | ICD-10-CM | POA: Diagnosis not present

## 2021-06-25 DIAGNOSIS — Z794 Long term (current) use of insulin: Secondary | ICD-10-CM | POA: Diagnosis not present

## 2021-06-25 DIAGNOSIS — F330 Major depressive disorder, recurrent, mild: Secondary | ICD-10-CM | POA: Diagnosis not present

## 2021-06-25 DIAGNOSIS — L602 Onychogryphosis: Secondary | ICD-10-CM | POA: Diagnosis not present

## 2021-06-25 DIAGNOSIS — E1169 Type 2 diabetes mellitus with other specified complication: Secondary | ICD-10-CM | POA: Diagnosis not present

## 2021-06-25 DIAGNOSIS — Z0000 Encounter for general adult medical examination without abnormal findings: Secondary | ICD-10-CM | POA: Diagnosis not present

## 2021-06-26 ENCOUNTER — Encounter: Payer: Self-pay | Admitting: Neurology

## 2021-06-26 ENCOUNTER — Ambulatory Visit: Payer: Medicare HMO | Admitting: Neurology

## 2021-06-26 VITALS — BP 150/74 | HR 82 | Ht 69.0 in | Wt 183.0 lb

## 2021-06-26 DIAGNOSIS — R0683 Snoring: Secondary | ICD-10-CM | POA: Diagnosis not present

## 2021-06-26 DIAGNOSIS — G4719 Other hypersomnia: Secondary | ICD-10-CM | POA: Diagnosis not present

## 2021-06-26 DIAGNOSIS — F03B18 Unspecified dementia, moderate, with other behavioral disturbance: Secondary | ICD-10-CM | POA: Diagnosis not present

## 2021-06-26 DIAGNOSIS — G4710 Hypersomnia, unspecified: Secondary | ICD-10-CM

## 2021-06-26 DIAGNOSIS — F03B0 Unspecified dementia, moderate, without behavioral disturbance, psychotic disturbance, mood disturbance, and anxiety: Secondary | ICD-10-CM | POA: Insufficient documentation

## 2021-06-26 NOTE — Progress Notes (Signed)
SLEEP MEDICINE CLINIC    Provider:  Larey Seat, MD  Primary Care Physician:  [name], MD [place] [address]     Referring Provider: Genia Harold, [place], [place],  [place] [address]          Chief Complaint according to patient   Patient presents with:     New Patient (Initial Visit)           HISTORY OF PRESENT ILLNESS:  [name] is a 80 y.o.  African American male patient seen here upon referral from Dr Billey Gosling, who follows him for a memory loss disorder-  Seen on 06/26/2021  for a non restorative sleep, REM BD and snoring with witnessed apnea. .  Chief concern according to patient : 06-26-2021 / Referred by Dr. Billey Gosling for sleep concerns. Pt c/o of snoring witnessed sleep apnea, daytime sleepiness and overall not sleep well at night. Here to r/o OSA and REM behavior disorder.   I have the pleasure of seeing [name] in the presence of his wife . He  has a past medical history of Bladder cancer ([place]), Cataracts, bilateral, poorly controlled Diabetes mellitus with insulin, Gout, Hearing loss, Heart disease, unspecified, Hypercholesterolemia, Hypertension, advanced Dementia , Prostate cancer ([place]), Sarcoidosis, and Unspecified glaucoma(365.9). he ambulates with a cane, his wife filled all paperwork and questionnaires for him.  Dr. [place] noted that the patient scored in a Mini-Mental status examination 17 out of 30 points.  He will have some day-to-day forgetfulness forgetting chores at home he has gotten has forgotten conversations that have taken place a day earlier, his long-term memory reportedly was still considered intact, he had been forgetting where he was supposed to go when he was driving.  He was placed on donepezil 10 mg for about a year when Dr. Donneta Romberg saw him last on 05 June 2021.  There were word finding difficulties endorsed he is no longer driving his wife has always handled the finances, his wife  also does the grocery shopping and runs errands.  His medication list was briefly reviewed.  He is on insulin.    There is no family history of dementia mentioned.    There has been no weight loss or change since last visit, he has been borderline anemic as of December 2022.  His hemoglobin A1c was quoted at 10 which would have been very high but this date of this test was 7-8 2019.  An MRI of the brain had been done in 2018 showing chronic microvascular Changes.  Dr. Billey Gosling had added Namenda.       Sleep relevant medical history: Nocturia 3-4 times. Not a sleep walker, has not fallen out of bed,  only sleep talking.   No Parasomnia, sleeps a lot in daytime. Sometimes snoring. Wife did not respond my question  of disorientation at night?  Wife reports coughing at night, not apnea.     Family medical /sleep history: has 2 sisters and one brother - no other family member on CPAP with OSA, Social history:  Patient is retired from Statistician-  and lives in a household with spouse,  Pets are not present. Tobacco use; none now.   ETOH use ; none - used to drink socially-  Caffeine intake in form of Coffee( 1 mug in AM ).     Sleep habits are as follows: The patient's dinner time is between 7 PM. The patient goes to bed  at 10 PM and reports he falls asleep quickly-  continues to sleep for 2-3 hours, wakes for many bathroom breaks. The preferred sleep position is sideways, but wife reports snoring present when in supine . with the support of 2 pillows. Dreams are reportedly frequent. The couple denies vivid dreams or enactment of dreams.  9  AM is the usual rise time. Breakfast is after 9 AM, "insulin when he gets up at 9 AM " then states; you cant take insulin if you haven eaten". The patient wakes up spontaneously. He reports feeling refreshed or restored in AM. Naps are taken after lunch, then he sleeps another 2-3 hours.     Review of Systems: Out of a complete 14 system review, the patient  complains of only the following symptoms, and all other reviewed systems are negative.:    NO reported REM BD, parasomnia,  Patient is in advanced stage of dementia Hypersomnia , as frequently seen with dementia patients.  Poorly controlled diabetic.  He has no energy to do anything, no drive, no traction. Forgets everything he talked about.  Hearing impaired.    How likely are you to doze in the following situations: 0 = not likely, 1 = slight chance, 2 = moderate chance, 3 = high chance   Sitting and Reading?3 Watching Television? 3 Sitting inactive in a public place (theater or meeting)?3 As a passenger in a car for an hour without a break? no Lying down in the afternoon when circumstances permit?3 Sitting and talking to someone?2 Sitting quietly after lunch without alcohol?3 In a car, while stopped for a few minutes in traffic? No longer driving.   Total = 18/ 24 points   FSS endorsed at NA - / 63 points.   Social History   Socioeconomic History   Marital status: Married    Spouse name: Opal Sidles   Number of children: 3   Years of education: 9   Highest education level: 9th grade  Occupational History    Comment: retired Research scientist (medical)  Tobacco Use   Smoking status: Former    Packs/day: 0.50    Types: Cigarettes    Quit date: 04/21/2005    Years since quitting: 16.1   Smokeless tobacco: Never  Substance and Sexual Activity   Alcohol use: No   Drug use: No   Sexual activity: Not Currently  Other Topics Concern   Not on file  Social History Narrative   06/05/21 Lives with wife   Social Determinants of Health   Financial Resource Strain: Not on file  Food Insecurity: No Food Insecurity   Worried About Charity fundraiser in the Last Year: Never true   [place] in the Last Year: Never true  Transportation Needs: No Transportation Needs   Lack of Transportation (Medical): No   Lack of Transportation (Non-Medical): No  Physical Activity: Not on file  Stress: Not  on file  Social Connections: Not on file    Family History  Problem Relation Age of Onset   Hypertension Mother    Diabetes Mellitus I Mother    Hypertension Father    Coronary artery disease Father    Hypertension Sister    Hypertension Brother    Coronary artery disease Brother    Diabetes Mellitus I Brother    Diabetes Mellitus I Brother    Cancer Neg Hx     Past Medical History:  Diagnosis Date   Bladder cancer ([place])    in situ  Cataracts, bilateral    Diabetes mellitus    insulin dependent   Gout    Hearing loss    Heart disease, unspecified    Hypercholesterolemia    Hypertension    Memory changes    Prostate cancer [facility])    s/p radiation   Sarcoidosis    Unspecified glaucoma(365.9)     Past Surgical History:  Procedure Laterality Date   BLADDER SURGERY     for carcinoma   CATARACT EXTRACTION Bilateral 2011   knee replacement surgery  1999   right   PROSTATECTOMY  2013     Current Outpatient Medications on File Prior to Visit  Medication Sig Dispense Refill   ACCU-CHEK AVIVA PLUS test strip USE AS INSTRUCTED TO CHECK BLOOD SUGAR THREE TIMES DAILY 300 each 1   acetaminophen (TYLENOL) 325 MG tablet Take 2 tablets (650 mg total) by mouth every 6 (six) hours as needed for mild pain, fever or headache. 20 tablet 1   albuterol (VENTOLIN HFA) 108 (90 Base) MCG/ACT inhaler Inhale 1-2 puffs into the lungs every 4 (four) hours as needed for wheezing or shortness of breath. (Patient not taking: Reported on 06/05/2021) 1 each 0   amLODipine (NORVASC) 10 MG tablet Take 1 tablet (10 mg total) by mouth daily. 30 tablet 1   aspirin 81 MG tablet Take 81 mg by mouth at bedtime.      atorvastatin (LIPITOR) 20 MG tablet Take 20 mg by mouth daily.     Blood Glucose Monitoring Suppl (ACCU-CHEK AVIVA PLUS) w/Device KIT Use to check blood sugar 3 times per day dx code E11.65 1 kit 0   donepezil (ARICEPT) 10 MG tablet Take 1 tablet (10 mg total) by mouth at bedtime. 30 tablet 1    dorzolamide-timolol (COSOPT) 22.3-6.8 MG/ML ophthalmic solution Place 1 drop into both eyes 2 (two) times daily.      escitalopram (LEXAPRO) 5 MG tablet Take 1 tablet (5 mg total) by mouth at bedtime. 30 tablet 1   ferrous sulfate 325 (65 FE) MG tablet Take 1 tablet (325 mg total) by mouth 2 (two) times daily. 60 tablet 3   fish oil-omega-3 fatty acids 1000 MG capsule Take 1 g by mouth daily.      hydroxypropyl methylcellulose / hypromellose (ISOPTO TEARS / GONIOVISC) 2.5 % ophthalmic solution Place 1 drop into both eyes 4 (four) times daily as needed for dry eyes.      indomethacin (INDOCIN) 50 MG capsule Take 1 capsule (50 mg total) by mouth 2 (two) times daily as needed (gout). Take with food 20 capsule 1   Insulin Detemir (LEVEMIR FLEXTOUCH) 100 UNIT/ML Pen Inject 26 units daily (Patient not taking: Reported on 06/05/2021) 20 pen 1   insulin regular (NOVOLIN R RELION) 100 units/mL injection Inject 0.05-0.15 mLs (5-15 Units total) into the skin 3 (three) times daily with meals as needed for high blood sugar (per sliding scale). 10 mL 2   Insulin Syringe-Needle U-100 (INSULIN SYRINGE .5CC/31GX5/16") 31G X 5/16" 0.5 ML MISC Use one to inject insulin daily 30 each 1   latanoprost (XALATAN) 0.005 % ophthalmic solution Place 1 drop into both eyes at bedtime.      liraglutide (VICTOZA) 18 MG/3ML SOPN Inject 0.2 mLs (1.2 mg total) into the skin daily. (Patient not taking: Reported on 06/05/2021) 15 pen 1   meclizine (ANTIVERT) 25 MG tablet Take 1 tablet (25 mg total) by mouth 3 (three) times daily as needed for dizziness. 30 tablet 0   memantine (  NAMENDA TITRATION PAK) tablet pack 5 mg/day for =1 week; 5 mg twice daily for =1 week; 15 mg/day given in 5 mg and 10 mg separated doses for =1 week; then 10 mg twice daily 49 tablet 0   [START ON 07/03/2021] memantine (NAMENDA) 10 MG tablet Take 1 tablet (10 mg total) by mouth 2 (two) times daily. 60 tablet 3   metFORMIN (GLUCOPHAGE) 1000 MG tablet Take 1 tablet  (1,000 mg total) by mouth 2 (two) times daily with a meal. 60 tablet 2   metoprolol tartrate (LOPRESSOR) 25 MG tablet Take 1 tablet (25 mg total) by mouth 2 (two) times daily. 60 tablet 2   PREDNISOLONE ACETATE OP Place 1 drop into the right eye daily.     Spacer/Aero-Holding Chambers (AEROCHAMBER PLUS) inhaler Use with inhaler 1 each 2   tamsulosin (FLOMAX) 0.4 MG CAPS capsule Take 1 capsule (0.4 mg total) by mouth at bedtime. 30 capsule 1   No current facility-administered medications on file prior to visit.    Allergies  Allergen Reactions   Ace Inhibitors Other (See Comments)    angioedema   Allopurinol Rash    Physical exam:  There were no vitals filed for this visit. There is no height or weight on file to calculate BMI.   Wt Readings from Last 3 Encounters:  06/05/21 184 lb 9.6 oz (83.7 kg)  01/17/20 181 lb (82.1 kg)  01/04/19 183 lb (83 kg)     Ht Readings from Last 3 Encounters:  06/05/21 _0  (1.753 m)  01/17/20 6' (1.829 m)  01/04/19 6' (1.829 m)      General: The patient is awake, alert and appears not in acute distress. The patient is well groomed. Head: Normocephalic, atraumatic. Neck is supple. Mallampati 3,  neck circumference:16 inches . Nasal airflow patent.  Retrognathia is noted-  Dental status: biological uppers Cardiovascular:  Regular rate and cardiac rhythm by pulse,  without distended neck veins. Respiratory: Lungs are clear to auscultation.  Skin:  Without evidence of ankle edema, or rash. Trunk: The patient's posture is slouched.    Neurologic exam : The patient is not fully alert,  Memory; MMSE - Mini Mental State Exam 06/05/2021  Orientation to time 4  Orientation to Place 4  Registration 3  Attention/ Calculation 0  Recall 0  Language- name 2 objects 2  Language- repeat 0  Language- follow 3 step command 3  Language- read & follow direction 1  Language-read & follow direction-comments I had to read it to him  Write a sentence 0   Copy design 0  Total score 17    Speech : dysphonia and  aphasia.  Mood and affect are aloof- he seemed  not to realize he is the patient in this meeting.    Cranial nerves: no loss of smell or taste reported  Pupils are equal in size and round and sluggishly reactive to light. Funduscopic exam deferred.Glaucoma, arcus seniles.  Extraocular movements in vertical and horizontal planes were intact and without nystagmus. No Diplopia. Visual fields by finger perimetry - patient can't follow instruction.  Hearing was severely impaired to finger rub, vibration-tuning fork.   Facial sensation intact to fine touch.  Facial motor strength is symmetric and tongue and uvula move midline.  Neck ROM : rotation, tilt and flexion extension were normal for age and shoulder shrug was symmetrical.    Motor exam:  Symmetric bulk, tone and ROM.   Normal tone without cog wheeling, symmetric grip strength .  Sensory:  vibration by tuning fork absent at ankles.  Proprioception tested in the upper extremities was normal.   Coordination: Rapid alternating movements in the fingers/hands were of normal speed.  The Finger-to-nose maneuver was intact without evidence of ataxia, dysmetria or tremor.   Gait and station: Patient has a stooped posture, walks with a single prong cane  Deep tendon reflexes: in the  upper and lower extremities are attenuated.        After spending a total time of  50  minutes face to face and additional time for physical and neurologic examination, review of laboratory studies,  personal review of imaging studies, reports and results of other testing and review of referral information / records as far as provided in visit, I have established the following assessments:  Mrs Covello has been filling out paperwork during this office visit, well into the 25 minute mark.  History had to be obtained from her.  She is visibly frustrated with his lack of cooperation.  The couple has help  from 2 adult children.   1) no evidence of REM BD per patient's wife. Dx of Parkinson's disease was recently made, but both parties seem unaware.  2) hypersomnia , and this can be related to OSA- he is a snorer after all.  OSA risk factor is retrognathia, and former smoker- no COPD.  3)  Advanced dementia , uncontrolled DM as of 2019- Dr Chalmers Cater  is his diabetes specialist, [facility] [address].  White matter disease described. Fits DM, HTN co-morbidities.  Was there no atrophy? There are no hallucinations reported, and his wife denied having witnessed any lewy body activity.    My Plan is to proceed with:  1) I am happy to screen this gentleman for sleep apnea, but how is he supposed to adhere to the treatment options?  2) dementia patients often develop hypersomnia in the moderate stages of dementia. 17 points o MMSE are indication of Dementia, neurocognitive dysfunction, not MCI.  3) HST ordered.   Please  have patient follow up with PCP - At no point did he have chest pain.  He has no history of PE, DVT and is fairly active.  He has not had any shortness of breath with exertion recently.  On exam, patient does not have any signs of DVT and his lung exam is completely normal.  I would like to thank [name], MD and Genia Harold, [place], [place],  [place] [address] for allowing me to meet with and to take care of this pleasant patient.    I plan to follow up either personally or through our NP within 2-4 months.   CC: I will share my notes with PCP.  Electronically signed by: Larey Seat, MD 06/26/2021 10:55 AM  [facility] Board certified by The [employer] of Sleep Medicine and Diplomate of the [employer] of Sleep Medicine. Board certified In Neurology through the [employer], Fellow of the [employer] of Neurology. Medical Director of [facility].

## 2021-06-26 NOTE — Patient Instructions (Signed)
Dementia Caregiver Guide °Dementia is a term used to describe a number of symptoms that affect memory and thinking. The most common symptoms include: °Memory loss. °Trouble with language and communication. °Trouble concentrating. °Poor judgment and problems with reasoning. °Wandering from home or public places. °Extreme anxiety or depression. °Being suspicious or having angry outbursts and accusations. °Child-like behavior and language. °Dementia can be frightening and confusing. And taking care of someone with dementia can be challenging. This guide provides tips to help you when providing care for a person with dementia. °How to help manage lifestyle changes °Dementia usually gets worse slowly over time. In the early stages, people with dementia can stay independent and safe with some help. In later stages, they need help with daily tasks such as dressing, grooming, and using the bathroom. There are actions you can take to help a person manage his or her life while living with this condition. °Communicating °When the person is talking or seems frustrated, make eye contact and hold the person's hand. °Ask specific questions that need yes or no answers. °Use simple words, short sentences, and a calm voice. Only give one direction at a time. °When offering choices, limit the person to just one or two. °Avoid correcting the person in a negative way. °If the person is struggling to find the right words, gently try to help him or her. °Preventing injury ° °Keep floors clear of clutter. Remove rugs, magazine racks, and floor lamps. °Keep hallways well lit, especially at night. °Put a handrail and nonslip mat in the bathtub or shower. °Put childproof locks on cabinets that contain dangerous items, such as medicines, alcohol, guns, toxic cleaning items, sharp tools or utensils, matches, and lighters. °For doors to the outside of the house, put the locks in places where the person cannot see or reach them easily. This will  help ensure that the person does not wander out of the house and get lost. °Be prepared for emergencies. Keep a list of emergency phone numbers and addresses in a convenient area. °Remove car keys and lock garage doors so that the person does not try to get in the car and drive. °Have the person wear a bracelet that tracks locations and identifies the person as having memory problems. This should be worn at all times for safety. °Helping with daily life ° °Keep the person on track with his or her routine. °Try to identify areas where the person may need help. °Be supportive, patient, calm, and encouraging. °Gently remind the person that adjusting to changes takes time. °Help with the tasks that the person has asked for help with. °Keep the person involved in daily tasks and decisions as much as possible. °Encourage conversation, but try not to get frustrated if the person struggles to find words or does not seem to appreciate your help. °How to recognize stress °Look for signs of stress in yourself and in the person you are caring for. If you notice signs of stress, take steps to manage it. Symptoms of stress include: °Feeling anxious, irritable, frustrated, or angry. °Denying that the person has dementia or that his or her symptoms will not improve. °Feeling depressed, hopeless, or unappreciated. °Difficulty sleeping. °Difficulty concentrating. °Developing stress-related health problems. °Feeling like you have too little time for your own life. °Follow these instructions at home: °Take care of your health °Make sure that you and the person you are caring for: °Get regular sleep. °Exercise regularly. °Eat regular, nutritious meals. °Take over-the-counter and prescription medicines only   as told by your health care providers. °Drink enough fluid to keep your urine pale yellow. °Attend all scheduled health care appointments. ° °General instructions °Join a support group with others who are caregivers. °Ask about  respite care resources. Respite care can provide short-term care for the person so that you can have a regular break from the stress of caregiving. °Consider any safety risks and take steps to avoid them. °Organize medicines in a pill box for each day of the week. °Create a plan to handle any legal or financial matters. Get legal or financial advice if needed. °Keep a calendar in a central location to remind the person of appointments or other activities. °Where to find support: °Many individuals and organizations offer support. These include: °Support groups for people with dementia. °Support groups for caregivers. °Counselors or therapists. °Home health care services. °Adult day care centers. °Where to find more information °Centers for Disease Control and Prevention: www.cdc.gov °Alzheimer's Association: www.alz.org °Family Caregiver Alliance: www.caregiver.org °Alzheimer's Foundation of [place]: www.alzfdn.org °Contact a health care provider if: °The person's health is rapidly getting worse. °You are no longer able to care for the person. °Caring for the person is affecting your physical and emotional health. °You are feeling depressed or anxious about caring for the person. °Get help right away if: °The person threatens himself or herself, you, or anyone else. °You feel depressed or sad, or feel that you want to harm yourself. °If you ever feel like your loved one may hurt himself or herself or others, or if he or she shares thoughts about taking his or her own life, get help right away. You can go to your nearest emergency department or: °Call your local emergency services (911 in the [place] °Call a suicide crisis helpline, such as the National Suicide Prevention Lifeline at 1-800-273-8255 or 988 in the [place] This is open 24 hours a day in the [place] °Text the Crisis Text Line at 741741 (in the [place] °Summary °Dementia is a term used to describe a number of symptoms that affect memory and thinking. °Dementia  usually gets worse slowly over time. °Take steps to reduce the person's risk of injury and to plan for future care. °Caregivers need support, relief from caregiving, and time for their own lives. °This information is not intended to replace advice given to you by your health care provider. Make sure you discuss any questions you have with your health care provider. °Document Revised: 10/31/2020 Document Reviewed: 08/22/2019 °Elsevier Patient Education © 2022 [employer] ° °

## 2021-07-08 ENCOUNTER — Ambulatory Visit: Payer: Medicare HMO | Admitting: Podiatrist

## 2021-07-12 ENCOUNTER — Encounter: Payer: Self-pay | Admitting: Podiatry

## 2021-07-12 ENCOUNTER — Ambulatory Visit: Payer: Medicare HMO | Admitting: Podiatry

## 2021-07-12 ENCOUNTER — Other Ambulatory Visit: Payer: Self-pay

## 2021-07-12 DIAGNOSIS — M79674 Pain in right toe(s): Secondary | ICD-10-CM

## 2021-07-12 DIAGNOSIS — B351 Tinea unguium: Secondary | ICD-10-CM | POA: Diagnosis not present

## 2021-07-12 DIAGNOSIS — E119 Type 2 diabetes mellitus without complications: Secondary | ICD-10-CM | POA: Diagnosis not present

## 2021-07-12 DIAGNOSIS — M79675 Pain in left toe(s): Secondary | ICD-10-CM

## 2021-07-12 NOTE — Progress Notes (Signed)
Subjective: ?[name] presents today referred by White, [name], MD for diabetic foot evaluation. ? ?Patient relates 8 years of diabetes ? ?Patient denies any history of foot wounds. ? ?Patient denies any history of numbness, tingling, burning, pins/needles sensations. ? ?Past Medical History:  ?Diagnosis Date  ? Bladder cancer (HCC)   ? in situ  ? Cataracts, bilateral   ? Diabetes mellitus   ? insulin dependent  ? Gout   ? Hearing loss   ? Heart disease, unspecified   ? Hypercholesterolemia   ? Hypertension   ? Memory changes   ? Prostate cancer (HCC)   ? s/p radiation  ? Sarcoidosis   ? Unspecified glaucoma(365.9)   ? ? ?Patient Active Problem List  ? Diagnosis Date Noted  ? Moderate dementia 06/26/2021  ? Hypersomnia, organic 06/26/2021  ? Excessive daytime sleepiness 06/26/2021  ? Snoring 06/26/2021  ? Fever in adult 10/26/2017  ? Bladder cancer (HCC) 10/26/2017  ? Mediastinal lymphadenopathy 10/26/2017  ? Polyarthralgia 10/26/2017  ? Effusion of left knee joint 10/26/2017  ? Angioedema 02/01/2014  ? Essential hypertension 02/01/2014  ? Type 2 diabetes mellitus with diabetic nephropathy (HCC) 02/01/2014  ? Hyperlipemia 02/01/2014  ? Pure hypercholesterolemia 12/08/2012  ? Type 2 diabetes mellitus with other specified complication (HCC) 12/06/2012  ? Unspecified essential hypertension 12/06/2012  ? Prostate cancer (HCC) 04/30/2011  ? ? ?Past Surgical History:  ?Procedure Laterality Date  ? BLADDER SURGERY    ? for carcinoma  ? CATARACT EXTRACTION Bilateral 2011  ? knee replacement surgery  1999  ? right  ? PROSTATECTOMY  2013  ? ? ?Current Outpatient Medications on File Prior to Visit  ?Medication Sig Dispense Refill  ? ACCU-CHEK AVIVA PLUS test strip USE AS INSTRUCTED TO CHECK BLOOD SUGAR THREE TIMES DAILY 300 each 1  ? acetaminophen (TYLENOL) 325 MG tablet Take 2 tablets (650 mg total) by mouth every 6 (six) hours as needed for mild pain, fever or headache. 20 tablet 1  ? albuterol (VENTOLIN HFA) 108 (90  Base) MCG/ACT inhaler Inhale 1-2 puffs into the lungs every 4 (four) hours as needed for wheezing or shortness of breath. 1 each 0  ? amLODipine (NORVASC) 10 MG tablet Take 1 tablet (10 mg total) by mouth daily. 30 tablet 1  ? aspirin 81 MG tablet Take 81 mg by mouth at bedtime.     ? atorvastatin (LIPITOR) 20 MG tablet Take 20 mg by mouth daily.    ? Blood Glucose Monitoring Suppl (ACCU-CHEK AVIVA PLUS) w/Device KIT Use to check blood sugar 3 times per day dx code E11.65 1 kit 0  ? donepezil (ARICEPT) 10 MG tablet Take 1 tablet (10 mg total) by mouth at bedtime. 30 tablet 1  ? dorzolamide-timolol (COSOPT) 22.3-6.8 MG/ML ophthalmic solution Place 1 drop into both eyes 2 (two) times daily.     ? escitalopram (LEXAPRO) 5 MG tablet Take 1 tablet (5 mg total) by mouth at bedtime. 30 tablet 1  ? ferrous sulfate 325 (65 FE) MG tablet Take 1 tablet (325 mg total) by mouth 2 (two) times daily. 60 tablet 3  ? fish oil-omega-3 fatty acids 1000 MG capsule Take 1 g by mouth daily.     ? hydroxypropyl methylcellulose / hypromellose (ISOPTO TEARS / GONIOVISC) 2.5 % ophthalmic solution Place 1 drop into both eyes 4 (four) times daily as needed for dry eyes.     ? indomethacin (INDOCIN) 50 MG capsule Take 1 capsule (50 mg total) by mouth 2 (two) times   daily as needed (gout). Take with food 20 capsule 1  ? insulin regular (NOVOLIN R RELION) 100 units/mL injection Inject 0.05-0.15 mLs (5-15 Units total) into the skin 3 (three) times daily with meals as needed for high blood sugar (per sliding scale). 10 mL 2  ? Insulin Syringe-Needle U-100 (INSULIN SYRINGE .5CC/31GX5/16") 31G X 5/16" 0.5 ML MISC Use one to inject insulin daily 30 each 1  ? latanoprost (XALATAN) 0.005 % ophthalmic solution Place 1 drop into both eyes at bedtime.     ? meclizine (ANTIVERT) 25 MG tablet Take 1 tablet (25 mg total) by mouth 3 (three) times daily as needed for dizziness. 30 tablet 0  ? memantine (NAMENDA TITRATION PAK) tablet pack 5 mg/day for =1 week; 5  mg twice daily for =1 week; 15 mg/day given in 5 mg and 10 mg separated doses for =1 week; then 10 mg twice daily 49 tablet 0  ? memantine (NAMENDA) 10 MG tablet Take 1 tablet (10 mg total) by mouth 2 (two) times daily. 60 tablet 3  ? metFORMIN (GLUCOPHAGE) 1000 MG tablet Take 1 tablet (1,000 mg total) by mouth 2 (two) times daily with a meal. (Patient taking differently: Take 1,000 mg by mouth daily.) 60 tablet 2  ? metoprolol tartrate (LOPRESSOR) 25 MG tablet Take 1 tablet (25 mg total) by mouth 2 (two) times daily. 60 tablet 2  ? PREDNISOLONE ACETATE OP Place 1 drop into the right eye daily.    ? Spacer/Aero-Holding Chambers (AEROCHAMBER PLUS) inhaler Use with inhaler 1 each 2  ? tamsulosin (FLOMAX) 0.4 MG CAPS capsule Take 1 capsule (0.4 mg total) by mouth at bedtime. 30 capsule 1  ? ?No current facility-administered medications on file prior to visit.  ?  ? ?Allergies  ?Allergen Reactions  ? Ace Inhibitors Other (See Comments)  ?  angioedema  ? Allopurinol Rash  ? ? ?Social History  ? ?Occupational History  ?  Comment: retired Research scientist (medical)  ?Tobacco Use  ? Smoking status: Former  ?  Packs/day: 0.50  ?  Types: Cigarettes  ?  Quit date: 04/21/2005  ?  Years since quitting: 16.2  ? Smokeless tobacco: Never  ?Substance and Sexual Activity  ? Alcohol use: No  ? Drug use: No  ? Sexual activity: Not Currently  ? ? ?Family History  ?Problem Relation Age of Onset  ? Hypertension Mother   ? Diabetes Mellitus I Mother   ? Hypertension Father   ? Coronary artery disease Father   ? Hypertension Sister   ? Hypertension Brother   ? Coronary artery disease Brother   ? Diabetes Mellitus I Brother   ? Diabetes Mellitus I Brother   ? Cancer Neg Hx   ? ? ?Immunization History  ?Administered Date(s) Administered  ? Influenza-Unspecified 01/25/2014, 01/23/2015, 12/14/2019  ? PFIZER(Purple Top)SARS-COV-2 Vaccination 05/26/2019, 06/20/2019, 02/25/2020  ? ? ?Review of systems: Positive Findings in bold print. ? ?Constitutional:  chills,  fatigue, fever, sweats, weight change ?Communication: Optometrist, sign Ecologist, hand writing, iPad/Android device ?Head: headaches, head injury ?Eyes: changes in vision, eye pain, glaucoma, cataracts, macular degeneration, diplopia, glare,  light sensitivity, eyeglasses or contacts, blindness ?Ears nose mouth throat: hearing impaired, hearing aids,  ringing in ears, deaf, sign language,  vertigo, nosebleeds,  rhinitis,  cold sores, snoring, swollen glands ?Cardiovascular: HTN, edema, arrhythmia, pacemaker in place, defibrillator in place, chest pain/tightness, chronic anticoagulation, blood clot, heart failure, MI ?Peripheral Vascular: leg cramps, varicose veins, blood clots, lymphedema, varicosities ?Respiratory:  asthma, difficulty  breathing, denies congestion, SOB, wheezing, cough, emphysema ?Gastrointestinal: change in appetite or weight, abdominal pain, constipation, diarrhea, nausea, vomiting, vomiting blood, change in bowel habits, abdominal pain, jaundice, rectal bleeding, hemorrhoids, GERD ?Genitourinary:  nocturia,  pain on urination, polyuria,  blood in urine, Foley catheter, urinary urgency, ESRD on hemodialysis ?Musculoskeletal: amputation, cramping, stiff joints, painful joints, decreased joint motion, fractures, OA, gout, hemiplegia, paraplegia, uses cane, wheelchair bound, uses walker, uses rollator ?Skin: +changes in toenails, color change, dryness, itching, mole changes,  rash, wound(s) ?Neurological: headaches, numbness in feet, paresthesias in feet, burning in feet, fainting,  seizures, change in speech, migraines, memory problems/poor historian, cerebral palsy, weakness, paralysis, CVA, TIA ?Endocrine: diabetes, hypothyroidism, hyperthyroidism,  goiter, dry mouth, flushing, heat intolerance, cold intolerance,  excessive thirst, denies polyuria,  nocturia ?Hematological:  easy bleeding, excessive bleeding, easy bruising, enlarged lymph nodes, on long term blood thinner, history of  past transusions ?Allergy/immunological:  hives, eczema, frequent infections, multiple drug allergies, seasonal allergies, transplant recipient, multiple food allergies ?Psychiatric:  anxiety, depression,

## 2021-07-24 ENCOUNTER — Ambulatory Visit: Payer: Medicare HMO | Admitting: Neurology

## 2021-07-24 DIAGNOSIS — R0683 Snoring: Secondary | ICD-10-CM

## 2021-07-24 DIAGNOSIS — G4710 Hypersomnia, unspecified: Secondary | ICD-10-CM

## 2021-07-24 DIAGNOSIS — F03B18 Unspecified dementia, moderate, with other behavioral disturbance: Secondary | ICD-10-CM

## 2021-07-24 DIAGNOSIS — G4719 Other hypersomnia: Secondary | ICD-10-CM

## 2021-07-29 ENCOUNTER — Ambulatory Visit: Payer: Self-pay

## 2021-08-07 ENCOUNTER — Ambulatory Visit: Payer: Medicare HMO | Admitting: Neurology

## 2021-08-12 ENCOUNTER — Ambulatory Visit: Payer: Medicare HMO | Admitting: Cardiology

## 2021-08-12 ENCOUNTER — Encounter: Payer: Self-pay | Admitting: Cardiology

## 2021-08-12 VITALS — BP 140/70 | HR 71 | Ht 69.0 in | Wt 183.0 lb

## 2021-08-12 DIAGNOSIS — E7800 Pure hypercholesterolemia, unspecified: Secondary | ICD-10-CM | POA: Diagnosis not present

## 2021-08-12 DIAGNOSIS — I10 Essential (primary) hypertension: Secondary | ICD-10-CM | POA: Diagnosis not present

## 2021-08-12 DIAGNOSIS — T783XXS Angioneurotic edema, sequela: Secondary | ICD-10-CM | POA: Diagnosis not present

## 2021-08-12 NOTE — Assessment & Plan Note (Signed)
Avoid ACE inhibitors.  Prior angioedema.  Avoid ARB's as well. ?

## 2021-08-12 NOTE — Assessment & Plan Note (Signed)
140/70 today.  At upper edge.  Continue with amlodipine 10 mg as well as metoprolol 25 mg twice a day.  No ACE inhibitor because of prior angioedema.  Continue with movement, exercise, low-sodium.  Monitored by Dr. Dema Severin as well. ?

## 2021-08-12 NOTE — Progress Notes (Signed)
?Cardiology Office Note:   ? ?Date:  08/12/2021  ? ?ID:  [name], DOB [birth year], MRN [id number] ? ?PCP:  [name], MD ?  ?[facility] Providers ?Cardiologist:  Candee Furbish, MD    ? ?Referring MD: [name], MD  ? ? ?History of Present Illness:   ? ?[name] is a 81 y.o. male here for the follow-up of myocardial infarction previously on medical records.  He does not recall having prior cardiac catheterization or stent placement.  Echocardiogram in 2015 showed normal ejection fraction.  He saw Dr. Melvern Banker. ? ?Diabetes hypertension chronic kidney disease stage II and iron deficiency anemia. ? ?Previously left leg edema resolved.  He has been married over 32 years.  Had angioedema with ACE inhibitor. ? ?Overall he is doing quite well without any fevers chills nausea vomiting syncope bleeding.  No chest pain no shortness of breath.  Ambulates with a cane ?Past Medical History:  ?Diagnosis Date  ? Bladder cancer ([place])   ? in situ  ? Cataracts, bilateral   ? Diabetes mellitus   ? insulin dependent  ? Gout   ? Hearing loss   ? Heart disease, unspecified   ? Hypercholesterolemia   ? Hypertension   ? Memory changes   ? Prostate cancer ([place])   ? s/p radiation  ? Sarcoidosis   ? Unspecified glaucoma(365.9)   ? ? ?Past Surgical History:  ?Procedure Laterality Date  ? BLADDER SURGERY    ? for carcinoma  ? CATARACT EXTRACTION Bilateral 2011  ? knee replacement surgery  1999  ? right  ? PROSTATECTOMY  2013  ? ? ?Current Medications: ?Current Meds  ?Medication Sig  ? ACCU-CHEK AVIVA PLUS test strip USE AS INSTRUCTED TO CHECK BLOOD SUGAR THREE TIMES DAILY  ? acetaminophen (TYLENOL) 325 MG tablet Take 2 tablets (650 mg total) by mouth every 6 (six) hours as needed for mild pain, fever or headache.  ? albuterol (VENTOLIN HFA) 108 (90 Base) MCG/ACT inhaler Inhale 1-2 puffs into the lungs every 4 (four) hours as needed for wheezing or shortness of breath.  ? amLODipine (NORVASC) 10 MG tablet Take 1 tablet (10 mg  total) by mouth daily.  ? aspirin 81 MG tablet Take 81 mg by mouth at bedtime.   ? atorvastatin (LIPITOR) 20 MG tablet Take 20 mg by mouth daily.  ? Blood Glucose Monitoring Suppl (ACCU-CHEK AVIVA PLUS) w/Device KIT Use to check blood sugar 3 times per day dx code E11.65  ? donepezil (ARICEPT) 10 MG tablet Take 1 tablet (10 mg total) by mouth at bedtime.  ? dorzolamide-timolol (COSOPT) 22.3-6.8 MG/ML ophthalmic solution Place 1 drop into both eyes 2 (two) times daily.   ? escitalopram (LEXAPRO) 5 MG tablet Take 1 tablet (5 mg total) by mouth at bedtime.  ? ferrous sulfate 325 (65 FE) MG tablet Take 1 tablet (325 mg total) by mouth 2 (two) times daily.  ? fish oil-omega-3 fatty acids 1000 MG capsule Take 1 g by mouth daily.   ? hydroxypropyl methylcellulose / hypromellose (ISOPTO TEARS / GONIOVISC) 2.5 % ophthalmic solution Place 1 drop into both eyes 4 (four) times daily as needed for dry eyes.   ? indomethacin (INDOCIN) 50 MG capsule Take 1 capsule (50 mg total) by mouth 2 (two) times daily as needed (gout). Take with food  ? insulin regular (NOVOLIN R RELION) 100 units/mL injection Inject 0.05-0.15 mLs (5-15 Units total) into the skin 3 (three) times daily with meals as needed for high blood sugar (  per sliding scale).  ? Insulin Syringe-Needle U-100 (INSULIN SYRINGE .5CC/31GX5/16") 31G X 5/16" 0.5 ML MISC Use one to inject insulin daily  ? latanoprost (XALATAN) 0.005 % ophthalmic solution Place 1 drop into both eyes at bedtime.   ? meclizine (ANTIVERT) 25 MG tablet Take 1 tablet (25 mg total) by mouth 3 (three) times daily as needed for dizziness.  ? memantine (NAMENDA TITRATION PAK) tablet pack 5 mg/day for =1 week; 5 mg twice daily for =1 week; 15 mg/day given in 5 mg and 10 mg separated doses for =1 week; then 10 mg twice daily  ? memantine (NAMENDA) 10 MG tablet Take 1 tablet (10 mg total) by mouth 2 (two) times daily.  ? metFORMIN (GLUCOPHAGE) 1000 MG tablet Take 1 tablet (1,000 mg total) by mouth 2 (two)  times daily with a meal. (Patient taking differently: Take 1,000 mg by mouth daily.)  ? metoprolol tartrate (LOPRESSOR) 25 MG tablet Take 1 tablet (25 mg total) by mouth 2 (two) times daily.  ? PREDNISOLONE ACETATE OP Place 1 drop into the right eye daily.  ? Spacer/Aero-Holding Chambers (AEROCHAMBER PLUS) inhaler Use with inhaler  ? tamsulosin (FLOMAX) 0.4 MG CAPS capsule Take 1 capsule (0.4 mg total) by mouth at bedtime.  ?  ? ?Allergies:   Ace inhibitors and Allopurinol  ? ?Social History  ? ?Socioeconomic History  ? Marital status: Married  ?  Spouse name: Opal Sidles  ? Number of children: 3  ? Years of education: 36  ? Highest education level: 9th grade  ?Occupational History  ?  Comment: retired Research scientist (medical)  ?Tobacco Use  ? Smoking status: Former  ?  Packs/day: 0.50  ?  Types: Cigarettes  ?  Quit date: 04/21/2005  ?  Years since quitting: 16.3  ? Smokeless tobacco: Never  ?Substance and Sexual Activity  ? Alcohol use: No  ? Drug use: No  ? Sexual activity: Not Currently  ?Other Topics Concern  ? Not on file  ?Social History Narrative  ? 06/05/21 Lives with wife  ? Right handed  ? Caffeine: 1 C of coffee a day  ? ?Social Determinants of Health  ? ?Financial Resource Strain: Not on file  ?Food Insecurity: No Food Insecurity  ? Worried About Charity fundraiser in the Last Year: Never true  ? Ran Out of Food in the Last Year: Never true  ?Transportation Needs: No Transportation Needs  ? Lack of Transportation (Medical): No  ? Lack of Transportation (Non-Medical): No  ?Physical Activity: Not on file  ?Stress: Not on file  ?Social Connections: Not on file  ?  ? ?Family History: ?The patient's family history includes Coronary artery disease in his brother and father; Diabetes Mellitus I in his brother, brother, and mother; Hypertension in his brother, father, mother, and sister. There is no history of Cancer. ? ?ROS:   ?Please see the history of present illness.    ? All other systems reviewed and are  negative. ? ?EKGs/Labs/Other Studies Reviewed:   ? ?The following studies were reviewed today: ? ?ECHO 2015: ?- Left ventricle: The cavity size was normal. There was mild focal  ?  basal hypertrophy of the septum. Systolic function was normal.  ?  The estimated ejection fraction was in the range of 60% to 65%.  ?  Wall motion was normal; there were no regional wall motion  ?  abnormalities. Doppler parameters are consistent with abnormal  ?  left ventricular relaxation (grade 1 diastolic dysfunction).  ?-  Mitral valve: There was systolic anterior motion of the chordal  ?  structures.  ? ?EKG:  EKG is  ordered today.  The ekg ordered today demonstrates sinus rhythm 71 no other abnormalities ? ?Recent Labs: ?03/24/2021: ALT 24; B Natriuretic Peptide 92.2; BUN 13; Creatinine, Ser 1.06; Hemoglobin 12.6; Platelets 179; Potassium 3.5; Sodium 141 ?06/05/2021: TSH 4.310  ?Recent Lipid Panel ?   ?Component Value Date/Time  ? CHOL 137 09/22/2016 0934  ? TRIG 89.0 09/22/2016 0934  ? HDL 37.30 (L) 09/22/2016 0934  ? CHOLHDL 4 09/22/2016 0934  ? VLDL 17.8 09/22/2016 0934  ? Luck 81 09/22/2016 0934  ? ? ? ?Risk Assessment/Calculations:   ? ? ?    ? ?   ? ?Physical Exam:   ? ?VS:  BP 140/70 (BP Location: Left Arm, Patient Position: Sitting, Cuff Size: Normal)   Pulse 71   Ht 5' 9"  (1.753 m)   Wt 183 lb (83 kg)   BMI 27.02 kg/m?    ? ?Wt Readings from Last 3 Encounters:  ?08/12/21 183 lb (83 kg)  ?06/26/21 183 lb (83 kg)  ?06/05/21 184 lb 9.6 oz (83.7 kg)  ?  ? ?GEN:  Well nourished, well developed in no acute distress ?HEENT: Normal ?NECK: No JVD; No carotid bruits ?LYMPHATICS: No lymphadenopathy ?CARDIAC: RRR, no murmurs, no rubs, gallops ?RESPIRATORY:  Clear to auscultation without rales, wheezing or rhonchi  ?ABDOMEN: Soft, non-tender, non-distended ?MUSCULOSKELETAL:  No edema; No deformity  ?SKIN: Warm and dry ?NEUROLOGIC:  Alert and oriented x 3 ?PSYCHIATRIC:  Normal affect  ? ?ASSESSMENT:   ? ?1. Essential hypertension    ?2. Pure hypercholesterolemia   ?3. Primary hypertension   ?4. Angioedema, sequela   ? ?PLAN:   ? ?In order of problems listed above: ? ?Pure hypercholesterolemia ?Continue with atorvastatin 20 mg.  No myalgias.  Doing well.  Lab wo

## 2021-08-12 NOTE — Patient Instructions (Signed)
Medication Instructions:  ?The current medical regimen is effective;  continue present plan and medications. ? ? ?*If you need a refill on your cardiac medications before your next appointment, please call your pharmacy* ? ?Follow-Up: ?At [facility], you and your health needs are our priority.  As part of our continuing mission to provide you with exceptional heart care, we have created designated Provider Care Teams.  These Care Teams include your primary Cardiologist (physician) and Advanced Practice Providers (APPs -  Physician Assistants and Nurse Practitioners) who all work together to provide you with the care you need, when you need it. ? ?We recommend signing up for the patient portal called "MyChart".  Sign up information is provided on this After Visit Summary.  MyChart is used to connect with patients for Virtual Visits (Telemedicine).  Patients are able to view lab/test results, encounter notes, upcoming appointments, etc.  Non-urgent messages can be sent to your provider as well.   ?To learn more about what you can do with MyChart, go to [email or web address].   ? ?Your next appointment:   ?1 year(s) ? ?The format for your next appointment:   ?In Person ? ?Provider:   ?Candee Furbish, MD   ? ? ?Thank you for choosing [facility]!! ? ? ? ?Important Information About Sugar ? ? ? ? ?  ?

## 2021-08-12 NOTE — Assessment & Plan Note (Signed)
Continue with atorvastatin 20 mg.  No myalgias.  Doing well.  Lab work has been monitored by Dr. Dema Severin.  Excellent. ?

## 2021-08-26 DIAGNOSIS — R1031 Right lower quadrant pain: Secondary | ICD-10-CM | POA: Diagnosis not present

## 2021-08-27 ENCOUNTER — Other Ambulatory Visit: Payer: Self-pay | Admitting: Neurology

## 2021-08-27 DIAGNOSIS — G4710 Hypersomnia, unspecified: Secondary | ICD-10-CM

## 2021-08-27 DIAGNOSIS — C61 Malignant neoplasm of prostate: Secondary | ICD-10-CM | POA: Diagnosis not present

## 2021-08-27 DIAGNOSIS — F03B18 Unspecified dementia, moderate, with other behavioral disturbance: Secondary | ICD-10-CM

## 2021-08-27 DIAGNOSIS — R0683 Snoring: Secondary | ICD-10-CM

## 2021-08-27 DIAGNOSIS — G4719 Other hypersomnia: Secondary | ICD-10-CM

## 2021-09-03 DIAGNOSIS — R351 Nocturia: Secondary | ICD-10-CM | POA: Diagnosis not present

## 2021-09-03 DIAGNOSIS — D090 Carcinoma in situ of bladder: Secondary | ICD-10-CM | POA: Diagnosis not present

## 2021-09-03 DIAGNOSIS — N3281 Overactive bladder: Secondary | ICD-10-CM | POA: Diagnosis not present

## 2021-09-08 ENCOUNTER — Ambulatory Visit (INDEPENDENT_AMBULATORY_CARE_PROVIDER_SITE_OTHER): Payer: Medicare HMO | Admitting: Neurology

## 2021-09-08 DIAGNOSIS — F03B18 Unspecified dementia, moderate, with other behavioral disturbance: Secondary | ICD-10-CM

## 2021-09-08 DIAGNOSIS — G4710 Hypersomnia, unspecified: Secondary | ICD-10-CM | POA: Diagnosis not present

## 2021-09-08 DIAGNOSIS — G4719 Other hypersomnia: Secondary | ICD-10-CM

## 2021-09-08 DIAGNOSIS — R0683 Snoring: Secondary | ICD-10-CM

## 2021-09-18 ENCOUNTER — Ambulatory Visit: Payer: Medicare HMO | Admitting: Psychiatry

## 2021-09-18 ENCOUNTER — Encounter: Payer: Self-pay | Admitting: Psychiatry

## 2021-09-18 VITALS — BP 121/59 | HR 76 | Ht 69.0 in | Wt 181.2 lb

## 2021-09-18 DIAGNOSIS — G20 Parkinson's disease: Secondary | ICD-10-CM | POA: Diagnosis not present

## 2021-09-18 DIAGNOSIS — G20C Parkinsonism, unspecified: Secondary | ICD-10-CM

## 2021-09-18 DIAGNOSIS — F01B0 Vascular dementia, moderate, without behavioral disturbance, psychotic disturbance, mood disturbance, and anxiety: Secondary | ICD-10-CM | POA: Diagnosis not present

## 2021-09-18 MED ORDER — CARBIDOPA-LEVODOPA 25-100 MG PO TABS: ORAL_TABLET | ORAL | 6 refills | Status: DC

## 2021-09-18 MED ORDER — ESCITALOPRAM OXALATE 10 MG PO TABS: 10.0000 mg | ORAL_TABLET | Freq: Every day | ORAL | 6 refills | Status: DC

## 2021-09-18 NOTE — Progress Notes (Signed)
   CC:  memory loss  Follow-up Visit  Last visit: 06/05/21  Brief HPI: 81 year old male with a history of HTN, HLD, diabetes who follows in clinic for memory loss.  At his last visit he was started on Namenda and referred to Sleep for OSA evaluation.  Interval History: Since his last visit his memory been about the same. Has not noticed a difference with addition of Namenda. It does not appear to be causing side effects.  Wife states he is sleeping most of the day and does not seem to have any motivation. Had a sleep study recently and results are still pending.  Continues to have a tremor in his right hand which is bothersome to him. He has some difficulty with gait and balance, no recent falls. He is using a cane to ambulate.  Physical Exam:   Vital Signs: BP (!) 121/59   Pulse 76   Ht '5\' 9"'$  (1.753 m)   Wt 181 lb 3.2 oz (82.2 kg)   BMI 26.76 kg/m  GENERAL:  well appearing, in no acute distress, alert  SKIN:  Color, texture, turgor normal. No rashes or lesions HEAD:  Normocephalic/atraumatic. RESP: normal respiratory effort MSK:  No gross joint deformities.   NEUROLOGICAL:    09/18/2021    1:42 PM 06/05/2021    9:26 AM  MMSE - Mini Mental State Exam  Orientation to time 2 4  Orientation to Place 4 4  Registration 2 3  Attention/ Calculation 0 0  Recall 1 0  Language- name 2 objects 2 2  Language- repeat 0 0  Language- follow 3 step command 3 3  Language- read & follow direction 1 1  Language-read & follow direction-comments had to read it to him I had to read it to him  Write a sentence 0 0  Copy design 0 0  Total score 15 17   Cranial Nerves: PERRL, face symmetric, no dysarthria, hearing grossly intact Motor: moves all extremities equally. Bradykinesia on finger tapping bilateral upper extremities, resting tremor present R>L upper extremities Gait: decreased stride length, stooped posture  IMPRESSION: 81 year old male with a history of HTN, HLD, diabetes who  follows in clinic for dementia. Suspect vascular dementia based on microvascular changes seen on MRI. MMSE today is 15/30, slightly worse than his last visit. Will increase his Lexapro to 10 mg daily to help with his depression and lack of motivation. Sleep study results are pending. He continues to have a tremor and gait difficulty suggestive of Parkinson's disease. Will start Sinemet to help with these symptoms.  PLAN: -Increase Lexapro to 10 mg daily -Start Sinemet 25-100: 1/2 tab BID x1 week, then 1/2 tab TID x1 week, then 1 tab TID -Continue Namenda and donepezil for now   Follow-up: 5 months  I spent a total of 29 minutes on the date of the service. Discussed medication side effects, adverse reactions and drug interactions. Written educational materials and patient instructions outlining all of the above were given.  Genia Harold, MD 09/18/21 2:17 PM

## 2021-09-18 NOTE — Patient Instructions (Signed)
Sinemet (generic name: carbidopa-levodopa) 25/100 mg: Take half a pill twice daily (8 AM and noon) for one week, then half a pill 3 times a day (8 AM, noon, and 4 PM) for one week, then one pill 3 times a day thereafter.   Increase Lexapro to 10 mg daily to help with mood

## 2021-09-23 ENCOUNTER — Other Ambulatory Visit: Payer: Self-pay

## 2021-09-23 NOTE — Patient Outreach (Signed)
[facility]) Care Management  09/23/2021  Namir Neto 01-Apr-1941 [id number]   Telephone Assessment   Successful outreach call. Spoke briefly with spouse as she was resting while patient was still asleep. She reports that patient's condition remains about the same. He has seen several MDs recently.His dementia remains about the same. She continues to assist patient with ADLS/IADLs. No recent falls. Patient using cane. Appetite good and wgt stable. Blood sugars controlled at present. She denies any RN CM needs or concerns at this time.   Medications Reviewed Today     Reviewed by [name], RN (Registered Nurse) on 09/23/21 at [place] List Status: <None>   Medication Order Taking? Sig Documenting Provider Last Dose Status Informant  ACCU-CHEK AVIVA PLUS test strip [id number] No USE AS INSTRUCTED TO CHECK BLOOD SUGAR THREE TIMES DAILY [name], MD Taking Active Family Member  albuterol (VENTOLIN HFA) 108 (90 Base) MCG/ACT inhaler [id number] No Inhale 1-2 puffs into the lungs every 4 (four) hours as needed for wheezing or shortness of breath. [name], MD Taking Active   amLODipine (NORVASC) 10 MG tablet [id number] No Take 1 tablet (10 mg total) by mouth daily. [name], MD Taking Active   aspirin 81 MG tablet [id number] No Take 81 mg by mouth at bedtime.  [provider] Taking Active Family Member  atorvastatin (LIPITOR) 20 MG tablet [id number] No Take 20 mg by mouth daily. [provider] Taking Active   Blood Glucose Monitoring Suppl (ACCU-CHEK AVIVA PLUS) w/Device KIT [id number] No Use to check blood sugar 3 times per day dx code E11.65 [name], MD Taking Active Family Member  carbidopa-levodopa (SINEMET IR) 25-100 MG tablet [id number]  Take half a pill twice daily (8 AM and noon) for one week, then half a pill 3 times a day (8 AM, noon, and 4 PM) for one week, then one pill 3 times a day Genia Harold, MD  Active    donepezil (ARICEPT) 10 MG tablet [id number] No Take 1 tablet (10 mg total) by mouth at bedtime. [name], MD Taking Active   dorzolamide-timolol (COSOPT) 22.3-6.8 MG/ML ophthalmic solution [id number] No Place 1 drop into both eyes 2 (two) times daily.  [provider] Taking Active Family Member  escitalopram (LEXAPRO) 10 MG tablet [id number]  Take 1 tablet (10 mg total) by mouth daily. Genia Harold, MD  Active   ferrous sulfate 325 (65 FE) MG tablet [id number] No Take 1 tablet (325 mg total) by mouth 2 (two) times daily. [name], MD Taking Active   fish oil-omega-3 fatty acids 1000 MG capsule [id number] No Take 1 g by mouth daily.  [provider] Taking Active Family Member  GEMTESA 75 MG TABS [id number]  Take 1 tablet by mouth daily. [provider]  Active   hydroxypropyl methylcellulose / hypromellose (ISOPTO TEARS / GONIOVISC) 2.5 % ophthalmic solution [id number] No Place 1 drop into both eyes 4 (four) times daily as needed for dry eyes.  [provider] Taking Active Family Member  indomethacin (INDOCIN) 50 MG capsule [id number] No Take 1 capsule (50 mg total) by mouth 2 (two) times daily as needed (gout). Take with food [name], NP Taking Active   insulin regular (NOVOLIN R RELION) 100 units/mL injection [id number] No Inject 0.05-0.15 mLs (5-15 Units total) into the skin 3 (three) times daily with meals as needed for high blood sugar (per sliding scale). [name], MD Taking Active   Insulin Syringe-Needle U-100 (INSULIN SYRINGE .5CC/31GX5/16")  31G X 5/16" 0.5 ML MISC [id number] No Use one to inject insulin daily [name], MD Taking Active Family Member  latanoprost (XALATAN) 0.005 % ophthalmic solution [id number] No Place 1 drop into both eyes at bedtime.  [provider] Taking Active Family Member           Med Note (HARDEN, SUMMER D   Mon Oct 26, 2017  5:16 PM)    meclizine (ANTIVERT) 25 MG tablet [id number] No Take 1  tablet (25 mg total) by mouth 3 (three) times daily as needed for dizziness. [name], MD Taking Active Family Member  memantine [facility]) 10 MG tablet [id number] No Take 1 tablet (10 mg total) by mouth 2 (two) times daily. Genia Harold, MD Taking Active   metFORMIN (GLUCOPHAGE) 1000 MG tablet [id number] No Take 1 tablet (1,000 mg total) by mouth 2 (two) times daily with a meal.  Patient taking differently: Take 1,000 mg by mouth daily.   [name], MD Taking Active            Med Note [name], Octavio Graves   Wed Nov 18, 2017  2:07 PM)    metoprolol tartrate (LOPRESSOR) 25 MG tablet [id number] No Take 1 tablet (25 mg total) by mouth 2 (two) times daily. [name], MD Taking Active   PREDNISOLONE ACETATE OP [id number] No Place 1 drop into the right eye daily. [provider] Taking Active Family Member           Med Note [facility] MENDEZ, CARLOS A   Tue Jan 04, 2019  8:29 AM)    tamsulosin (FLOMAX) 0.4 MG CAPS capsule [id number] No Take 1 capsule (0.4 mg total) by mouth at bedtime. [name], MD Taking Active             Care Plan : RN Care Manager POC  Updates made by [name], RN since 09/23/2021 12:00 AM     Problem: Chronic Disease Mgmt of Chronic Condition-DM   Priority: High     Long-Range Goal: Development of POC for Mgmt of Chronic Condition-DM   Start Date: 04/26/2021  Expected End Date: 04/26/2022  This Visit's Progress: On track  Recent Progress: On track  Priority: High  Note:     Current Barriers:  Chronic Disease Management support and education needs related to DMII   RNCM Clinical Goal(s):  Patient will verbalize understanding of plan for management of DMII as evidenced by mgmt of chronic condition demonstrate Improved adherence to prescribed treatment plan for DMII as evidenced by lowering of A1C level continue to work with RN Care Manager to address care management and care coordination needs related to  DMII as  evidenced by adherence to CM Team Scheduled appointments through collaboration with RN Care manager, provider, and care team.   Interventions: POC sent to PCP upon initial assessment, quarterly and with any changes in patient's conditions Inter-disciplinary care team collaboration (see longitudinal plan of care) Evaluation of current treatment plan related to  self management and patient's adherence to plan as established by provider   Diabetes Interventions:  (Status:  Goal on track:  Yes.) Long Term Goal Assessed patient's understanding of A1c goal: <7% Provided education to patient about basic DM disease process Reviewed medications with patient and discussed importance of medication adherence Counseled on importance of regular laboratory monitoring as prescribed Discussed plans with patient for ongoing care management follow up and provided patient with direct contact information for care management team Lab Results  Component Value Date  HGBA1C 10.0 (H) 10/26/2017  04/26/21-Spouse reports patient saw endocrinologist on 02/18/21-A1C level at that time was 8.1-slightly up from previous value of 7.6.  06/24/21-Spouse reports appetite and wgt remains good and stable. Blood sugar this am was 110. 09/23/21-Spouse reports wgt and appetite remains good. Blood sugars controlled-ranging in the mid to low 100s.  Patient Goals/Self-Care Activities: Take all medications as prescribed Attend all scheduled provider appointments Call provider office for new concerns or questions  keep appointment with eye doctor check blood sugar at prescribed times: 2-3x/day check feet daily for cuts, sores or redness enter blood sugar readings and medication or insulin into daily log  Follow Up Plan:  Telephone follow up appointment with care management team member scheduled for:  quarterly-within the month of Sept The patient has been provided with contact information for the care management team and has been  advised to call with any health related questions or concerns.      Long-Range Goal: Chronic Disease Mgmt of Chronic Condition-Dementia   Start Date: 09/23/2021  Expected End Date: 09/24/2022  Priority: High  Note:   Current Barriers:  Chronic Disease Management support and education needs related to Dementia   RNCM Clinical Goal(s):  Patient will verbalize understanding of plan for management of Dementia as evidenced by mgmt of chronic conditions continue to work with RN Care Manager to address care management and care coordination needs related to  Dementia as evidenced by adherence to CM Team Scheduled appointments through collaboration with RN Care manager, provider, and care team.   Interventions: POC sent to PCP upon initial assessment, quarterly and with any changes in patient's conditions Inter-disciplinary care team collaboration (see longitudinal plan of care) Evaluation of current treatment plan related to  self management and patient's adherence to plan as established by provider   Dementia:  (Status:  New goal.)  Long Term Goal Evaluation of current treatment plan related to misuse of: Parkinson's Disease dementia Emotional Support Provided to patient/caregiver, Sleep assessment completed, Consideration of in-home help encouraged , and Discussed importance of attendance to all provider appointments  Patient Goals/Self-Care Activities: Take all medications as prescribed Attend all scheduled provider appointments Call provider office for new concerns or questions   Follow Up Plan:  Telephone follow up appointment with care management team member scheduled for:  quarterly-within the month of Sept The patient has been provided with contact information for the care management team and has been advised to call with any health related questions or concerns.         Plan: RN CM discussed with caregiver next outreach within the month of Sept. Caregiver agrees to care plan and  follow up.  RN CM will send quarterly update to PCP.  Enzo Montgomery, RN,BSN,CCM [place] Management Telephonic Care Management Coordinator Direct Phone: [phone] Toll Free: [phone] Fax: [phone]

## 2021-10-01 ENCOUNTER — Encounter (HOSPITAL_COMMUNITY): Payer: Self-pay

## 2021-10-01 ENCOUNTER — Ambulatory Visit (HOSPITAL_COMMUNITY)
Admission: EM | Admit: 2021-10-01 | Discharge: 2021-10-01 | Disposition: A | Discharge status: Home / Self Care | Payer: Medicare HMO | Attending: Family Medicine | Admitting: Family Medicine

## 2021-10-01 ENCOUNTER — Ambulatory Visit (INDEPENDENT_AMBULATORY_CARE_PROVIDER_SITE_OTHER): Payer: Medicare HMO

## 2021-10-01 DIAGNOSIS — R059 Cough, unspecified: Secondary | ICD-10-CM

## 2021-10-01 DIAGNOSIS — R053 Chronic cough: Secondary | ICD-10-CM

## 2021-10-01 MED ORDER — AZITHROMYCIN 250 MG PO TABS: 250.0000 mg | ORAL_TABLET | Freq: Every day | ORAL | 0 refills | Status: DC

## 2021-10-01 MED ORDER — BENZONATATE 100 MG PO CAPS: ORAL_CAPSULE | ORAL | 0 refills | Status: DC

## 2021-10-01 NOTE — ED Triage Notes (Signed)
Pt c/o productive cough with white sputum x3 wks. Took cough syrup with no relief. Denies SOB.

## 2021-10-01 NOTE — ED Provider Notes (Signed)
[facility]   [id number] 10/01/21 Arrival Time: 1532  ASSESSMENT & PLAN:  1. Persistent cough for 3 weeks or longer    Imaging: I have personally viewed the imaging studies ordered this visit. No acute changes on CXR.  Given duration, will treat for atypical. Begin: Meds ordered this encounter  Medications   azithromycin (ZITHROMAX) 250 MG tablet    Sig: Take 1 tablet (250 mg total) by mouth daily. Take first 2 tablets together, then 1 every day until finished.    Dispense:  6 tablet    Refill:  0   benzonatate (TESSALON) 100 MG capsule    Sig: Take 1 capsule by mouth every 8 (eight) hours for cough.    Dispense:  21 capsule    Refill:  0   VSS. No resp distress.  Recommend:  Follow-up Information     [name], MD.   Specialty: Family Medicine Why: If worsening or failing to improve as anticipated. Contact information: [place] [address] [phone]                 Reviewed expectations re: course of current medical issues. Questions answered. Outlined signs and symptoms indicating need for more acute intervention. Understanding verbalized. After Visit Summary given.   SUBJECTIVE: History from: Patient. [name] is a 81 y.o. male. Reports: productive cough; x3 weeks; ques longer; "cough up white stuff sometimes". Denies: runny nose, fever, sore throat, difficulty breathing, and headache. Normal PO intake without n/v/d. Cough does affect sleep. Ambulatory without difficulty. No assoc CP.  OBJECTIVE:  Vitals:   10/01/21 1555  BP: (!) 112/58  Pulse: 73  Resp: 18  Temp: 98.2 F (36.8 C)  TempSrc: Oral  SpO2: 90%    General appearance: alert; no distress Eyes: PERRLA; EOMI; conjunctiva normal HENT: [place]; AT; without nasal congestion; oropharynx moist Neck: supple  Lungs: speaks full sentences without difficulty; unlabored; question slight rales at bases upon initial inspiration but clear quickly;  otherwise CTAB Extremities: no edema Skin: warm and dry Neurologic: normal slow gait with cane Psychological: alert and cooperative; normal mood and affect  Imaging: DG Chest 2 View  Result Date: 10/01/2021 CLINICAL DATA:  Productive cough. EXAM: CHEST - 2 VIEW COMPARISON:  September 26, 2021. FINDINGS: The heart size and mediastinal contours are within normal limits. Both lungs are clear. The visualized skeletal structures are unremarkable. IMPRESSION: No active cardiopulmonary disease. Electronically Signed   By: Marijo Conception M.D.   On: 10/01/2021 16:47    Allergies  Allergen Reactions   Ace Inhibitors Other (See Comments)    angioedema   Allopurinol Rash    Past Medical History:  Diagnosis Date   Bladder cancer ([place])    in situ   Cataracts, bilateral    Diabetes mellitus    insulin dependent   Gout    Hearing loss    Heart disease, unspecified    Hypercholesterolemia    Hypertension    Memory changes    Prostate cancer ([place])    s/p radiation   Sarcoidosis    Unspecified glaucoma(365.9)    Social History   Socioeconomic History   Marital status: Married    Spouse name: Opal Sidles   Number of children: 3   Years of education: 9   Highest education level: 9th grade  Occupational History    Comment: retired Research scientist (medical)  Tobacco Use   Smoking status: Former    Packs/day: 0.50    Types:  Cigarettes    Quit date: 04/21/2005    Years since quitting: 16.4   Smokeless tobacco: Never  Substance and Sexual Activity   Alcohol use: No   Drug use: No   Sexual activity: Not Currently  Other Topics Concern   Not on file  Social History Narrative   06/05/21 Lives with wife   Right handed   Caffeine: 1 C of coffee a day   Social Determinants of Health   Financial Resource Strain: Not on file  Food Insecurity: No Food Insecurity (04/26/2021)   Hunger Vital Sign    Worried About Running Out of Food in the Last Year: Never true    Ran Out of Food in the Last Year: Never true   Transportation Needs: No Transportation Needs (04/26/2021)   PRAPARE - Hydrologist (Medical): No    Lack of Transportation (Non-Medical): No  Physical Activity: Not on file  Stress: Not on file  Social Connections: Not on file  Intimate Partner Violence: Not on file   Family History  Problem Relation Age of Onset   Hypertension Mother    Diabetes Mellitus I Mother    Hypertension Father    Coronary artery disease Father    Hypertension Sister    Hypertension Brother    Coronary artery disease Brother    Diabetes Mellitus I Brother    Diabetes Mellitus I Brother    Cancer Neg Hx    Past Surgical History:  Procedure Laterality Date   BLADDER SURGERY     for carcinoma   CATARACT EXTRACTION Bilateral 2011   knee replacement surgery  1999   right   PROSTATECTOMY  2013     Vanessa Kick, MD 10/02/21 1621

## 2021-10-04 DIAGNOSIS — K219 Gastro-esophageal reflux disease without esophagitis: Secondary | ICD-10-CM | POA: Diagnosis not present

## 2021-10-04 DIAGNOSIS — J209 Acute bronchitis, unspecified: Secondary | ICD-10-CM | POA: Diagnosis not present

## 2021-10-10 ENCOUNTER — Telehealth: Payer: Self-pay | Admitting: Podiatry

## 2021-10-10 NOTE — Telephone Encounter (Signed)
Called pt back who asked me to speak to his wife. I asked her how I could assist and she said someone called her husband yesterday. I informed her it was our automated system calling to remind him of his appointment scheduled on 6/26 at 32 am. Pt's wife requested to reschedule to July saying the pt's nails weren't long enough and he also had a cough. I rescheduled pt to Wednesday, 7/12 at 9 am.

## 2021-10-10 NOTE — Telephone Encounter (Signed)
Calling about [name]. Call him back. Thank you.

## 2021-10-11 ENCOUNTER — Other Ambulatory Visit (INDEPENDENT_AMBULATORY_CARE_PROVIDER_SITE_OTHER): Payer: Medicare HMO | Admitting: Neurology

## 2021-10-11 DIAGNOSIS — G4733 Obstructive sleep apnea (adult) (pediatric): Secondary | ICD-10-CM | POA: Insufficient documentation

## 2021-10-11 DIAGNOSIS — R053 Chronic cough: Secondary | ICD-10-CM | POA: Insufficient documentation

## 2021-10-11 DIAGNOSIS — G4719 Other hypersomnia: Secondary | ICD-10-CM | POA: Diagnosis not present

## 2021-10-11 DIAGNOSIS — F01B0 Vascular dementia, moderate, without behavioral disturbance, psychotic disturbance, mood disturbance, and anxiety: Secondary | ICD-10-CM | POA: Diagnosis not present

## 2021-10-11 DIAGNOSIS — R0902 Hypoxemia: Secondary | ICD-10-CM

## 2021-10-14 ENCOUNTER — Ambulatory Visit: Payer: Medicare HMO | Admitting: Podiatry

## 2021-10-15 ENCOUNTER — Telehealth: Payer: Self-pay | Admitting: Neurology

## 2021-10-30 ENCOUNTER — Ambulatory Visit: Payer: Medicare HMO | Admitting: Podiatry

## 2021-10-31 ENCOUNTER — Telehealth: Payer: Self-pay | Admitting: Neurology

## 2021-10-31 NOTE — Telephone Encounter (Signed)
I returned the patient wife [name] call- she scheduled the patient study for 11/24/21 at 8 pm.   CPAP Titration- [employer] Josem Kaufmann: [id number] (exp. 11/24/21 to 12/24/21)-I had to get an extension with [employer].   I also mailed packet to the patient.

## 2021-10-31 NOTE — Telephone Encounter (Signed)
Pt's wife returned call. Please call back when available at [phone].

## 2021-10-31 NOTE — Telephone Encounter (Signed)
unable to leave vmail the phone kept ringing & didnt go to [place]: [id number] (exp. 10/16/21 to 11/15/21)

## 2021-11-04 ENCOUNTER — Other Ambulatory Visit: Payer: Self-pay | Admitting: Psychiatry

## 2021-11-27 ENCOUNTER — Encounter: Payer: Self-pay | Admitting: Podiatry

## 2021-11-27 ENCOUNTER — Ambulatory Visit: Payer: Medicare HMO | Admitting: Podiatry

## 2021-11-27 DIAGNOSIS — E1121 Type 2 diabetes mellitus with diabetic nephropathy: Secondary | ICD-10-CM

## 2021-11-27 DIAGNOSIS — B351 Tinea unguium: Secondary | ICD-10-CM

## 2021-11-27 DIAGNOSIS — M79675 Pain in left toe(s): Secondary | ICD-10-CM

## 2021-11-27 DIAGNOSIS — M79674 Pain in right toe(s): Secondary | ICD-10-CM | POA: Diagnosis not present

## 2021-11-27 DIAGNOSIS — Z794 Long term (current) use of insulin: Secondary | ICD-10-CM

## 2021-11-27 NOTE — Progress Notes (Signed)
This patient returns to my office for at risk foot care.  This patient requires this care by a professional since this patient will be at risk due to having type 2 diabetes.  This patient is unable to cut nails himself since the patient cannot reach his nails.These nails are painful walking and wearing shoes.  This patient presents for at risk foot care today.  General Appearance  Alert, conversant and in no acute stress.  Vascular  Dorsalis pedis and posterior tibial  pulses are palpable  bilaterally.  Capillary return is within normal limits  bilaterally. Temperature is within normal limits  bilaterally.  Neurologic  Senn-Weinstein monofilament wire test within normal limits  bilaterally. Muscle power within normal limits bilaterally.  Nails Thick disfigured discolored nails with subungual debris  from hallux to fifth toes bilaterally. No evidence of bacterial infection or drainage bilaterally.  Orthopedic  No limitations of motion  feet .  No crepitus or effusions noted.  No bony pathology or digital deformities noted.  Skin  normotropic skin with no porokeratosis noted bilaterally.  No signs of infections or ulcers noted.     Onychomycosis  Pain in right toes  Pain in left toes  Consent was obtained for treatment procedures.   Mechanical debridement of nails 1-5  bilaterally performed with a nail nipper.  Filed with dremel without incident.    Return office visit   4 months                   Told patient to return for periodic foot care and evaluation due to potential at risk complications.   Cicilia Clinger DPM   

## 2021-12-09 DIAGNOSIS — R351 Nocturia: Secondary | ICD-10-CM | POA: Diagnosis not present

## 2021-12-09 DIAGNOSIS — R3916 Straining to void: Secondary | ICD-10-CM | POA: Diagnosis not present

## 2021-12-09 DIAGNOSIS — R3912 Poor urinary stream: Secondary | ICD-10-CM | POA: Diagnosis not present

## 2021-12-09 DIAGNOSIS — N3943 Post-void dribbling: Secondary | ICD-10-CM | POA: Diagnosis not present

## 2021-12-10 ENCOUNTER — Ambulatory Visit: Payer: Medicare HMO | Admitting: Pulmonary Disease

## 2021-12-10 ENCOUNTER — Encounter: Payer: Self-pay | Admitting: Pulmonary Disease

## 2021-12-10 ENCOUNTER — Ambulatory Visit (INDEPENDENT_AMBULATORY_CARE_PROVIDER_SITE_OTHER): Payer: Medicare HMO

## 2021-12-10 VITALS — BP 144/70 | HR 89 | Ht 69.0 in | Wt 183.4 lb

## 2021-12-10 DIAGNOSIS — R0609 Other forms of dyspnea: Secondary | ICD-10-CM | POA: Diagnosis not present

## 2021-12-10 DIAGNOSIS — R0602 Shortness of breath: Secondary | ICD-10-CM | POA: Diagnosis not present

## 2021-12-10 NOTE — Patient Instructions (Signed)
Chest xray today  Will schedule pulmonary function test  Follow up in 6 weeks

## 2021-12-10 NOTE — Progress Notes (Signed)
[facility]  Chief Complaint  Patient presents with   Consult    Consult: Dyspnea    Past Surgical History:  He  has a past surgical history that includes knee replacement surgery (1999); Cataract extraction (Bilateral, 2011); Bladder surgery; and Prostatectomy (2013).  Past Medical History:  Bladder cancer, Cataracts, DM type 2, HLD, HTN, Sarcoidosis, Prostate cancer, Glaucoma  Constitutional:  BP (!) 144/70 (BP Location: Right Arm)   Pulse 89   Ht 5' 9"  (1.753 m)   Wt 183 lb 6.4 oz (83.2 kg)   SpO2 91%   BMI 27.08 kg/m   Brief Summary:  [name] is a 81 y.o. male former smoker with dyspnea.      Subjective:   He is from [place].  He worked in a Advice worker.  He quit smoking about 16 yrs ago.  He would smoke 1 pack over a week.  No history of asthma.  He had pneumonia years ago.  He is reported to have history of sarcoidosis.  No history of thromboembolic disease.  He uses albuterol intermittently.  He gets cough with clear sputum.  Gets winded walking up a hill.  Intermittently gets wheezing.  Has ankle swelling.  No recent PFT.    Physical Exam:   Appearance - well kempt   ENMT - no sinus tenderness, no oral exudate, no LAN, Mallampati 4 airway, no stridor  Respiratory - equal breath sounds bilaterally, no wheezing or rales  CV - s1s2 regular rate and rhythm, no murmurs  Ext - no clubbing, no edema  Skin - no rashes  Psych - normal mood and affect   Pulmonary testing:    Chest Imaging:  CT chest 07/13/06 >> b/l mediastinal and hilar LAN, subpleural nodules, interstitial changes (suggestive of sarcoidosis)  Sleep Tests:  PSG 09/08/21 >> AHI 35.4, SpO2 low 82%  Cardiac Tests:    Social History:  He  reports that he quit smoking about 16 years ago. His smoking use included cigarettes. He smoked an average of .5 packs per day. He has never used smokeless tobacco. He reports that he does not drink  alcohol and does not use drugs.  Family History:  His family history includes Coronary artery disease in his brother and father; Diabetes Mellitus I in his brother, brother, and mother; Hypertension in his brother, father, mother, and sister.     Assessment/Plan:   Dyspnea on exertion. - history of tobacco abuse and reported history of sarcoidosis - will arrange for PFT and chest xray - continue prn albuterol for now  Obstructive sleep apnea. - he plans to continue follow up with [facility] for management of this  Time Spent Involved in Patient Care on Day of Examination:  45 minutes  Follow up:   Patient Instructions  Chest xray today  Will schedule pulmonary function test  Follow up in 6 weeks  Medication List:   Allergies as of 12/10/2021       Reactions   Ace Inhibitors Other (See Comments)   angioedema   Allopurinol Rash        Medication List        Accurate as of December 10, 2021 11:23 AM. If you have any questions, ask your nurse or doctor.          STOP taking these medications    azithromycin 250 MG tablet Commonly known as: ZITHROMAX Stopped by: Chesley Mires, MD       TAKE  these medications    Accu-Chek Aviva Plus test strip Generic drug: glucose blood USE AS INSTRUCTED TO CHECK BLOOD SUGAR THREE TIMES DAILY   Accu-Chek Aviva Plus w/Device Kit Use to check blood sugar 3 times per day dx code E11.65   albuterol 108 (90 Base) MCG/ACT inhaler Commonly known as: VENTOLIN HFA Inhale 1-2 puffs into the lungs every 4 (four) hours as needed for wheezing or shortness of breath.   amLODipine 10 MG tablet Commonly known as: NORVASC Take 1 tablet (10 mg total) by mouth daily.   aspirin 81 MG tablet Take 81 mg by mouth at bedtime.   atorvastatin 20 MG tablet Commonly known as: LIPITOR Take 20 mg by mouth daily.   benzonatate 100 MG capsule Commonly known as: TESSALON Take 1 capsule by mouth every 8 (eight) hours for cough.    carbidopa-levodopa 25-100 MG tablet Commonly known as: SINEMET IR Take half a pill twice daily (8 AM and noon) for one week, then half a pill 3 times a day (8 AM, noon, and 4 PM) for one week, then one pill 3 times a day   donepezil 10 MG tablet Commonly known as: ARICEPT Take 1 tablet (10 mg total) by mouth at bedtime.   dorzolamide-timolol 22.3-6.8 MG/ML ophthalmic solution Commonly known as: COSOPT Place 1 drop into both eyes 2 (two) times daily.   escitalopram 10 MG tablet Commonly known as: Lexapro Take 1 tablet (10 mg total) by mouth daily.   ferrous sulfate 325 (65 FE) MG tablet Take 1 tablet (325 mg total) by mouth 2 (two) times daily.   fish oil-omega-3 fatty acids 1000 MG capsule Take 1 g by mouth daily.   Gemtesa 75 MG Tabs Generic drug: Vibegron Take 1 tablet by mouth daily.   hydroxypropyl methylcellulose / hypromellose 2.5 % ophthalmic solution Commonly known as: ISOPTO TEARS / GONIOVISC Place 1 drop into both eyes 4 (four) times daily as needed for dry eyes.   indomethacin 50 MG capsule Commonly known as: INDOCIN Take 1 capsule (50 mg total) by mouth 2 (two) times daily as needed (gout). Take with food   insulin regular 100 units/mL injection Commonly known as: NovoLIN R ReliOn Inject 0.05-0.15 mLs (5-15 Units total) into the skin 3 (three) times daily with meals as needed for high blood sugar (per sliding scale).   INSULIN SYRINGE .5CC/31GX5/16" 31G X 5/16" 0.5 ML Misc Use one to inject insulin daily   latanoprost 0.005 % ophthalmic solution Commonly known as: XALATAN Place 1 drop into both eyes at bedtime.   meclizine 25 MG tablet Commonly known as: ANTIVERT Take 1 tablet (25 mg total) by mouth 3 (three) times daily as needed for dizziness.   memantine 10 MG tablet Commonly known as: NAMENDA Take 1 tablet by mouth twice daily   metFORMIN 1000 MG tablet Commonly known as: GLUCOPHAGE Take 1 tablet (1,000 mg total) by mouth 2 (two) times daily  with a meal. What changed: when to take this   metoprolol tartrate 25 MG tablet Commonly known as: LOPRESSOR Take 1 tablet (25 mg total) by mouth 2 (two) times daily.   PREDNISOLONE ACETATE OP Place 1 drop into the right eye daily.   tamsulosin 0.4 MG Caps capsule Commonly known as: FLOMAX Take 1 capsule (0.4 mg total) by mouth at bedtime.        Signature:  Chesley Mires, MD [place] Pager - [phone] 12/10/2021, 11:23 AM

## 2021-12-11 ENCOUNTER — Ambulatory Visit (INDEPENDENT_AMBULATORY_CARE_PROVIDER_SITE_OTHER): Payer: Medicare HMO | Admitting: Neurology

## 2021-12-11 DIAGNOSIS — R0902 Hypoxemia: Secondary | ICD-10-CM

## 2021-12-11 DIAGNOSIS — F01B0 Vascular dementia, moderate, without behavioral disturbance, psychotic disturbance, mood disturbance, and anxiety: Secondary | ICD-10-CM | POA: Diagnosis not present

## 2021-12-11 DIAGNOSIS — R053 Chronic cough: Secondary | ICD-10-CM | POA: Diagnosis not present

## 2021-12-11 DIAGNOSIS — G4719 Other hypersomnia: Secondary | ICD-10-CM

## 2021-12-11 DIAGNOSIS — G4733 Obstructive sleep apnea (adult) (pediatric): Secondary | ICD-10-CM | POA: Diagnosis not present

## 2021-12-16 DIAGNOSIS — R351 Nocturia: Secondary | ICD-10-CM | POA: Diagnosis not present

## 2021-12-16 DIAGNOSIS — N401 Enlarged prostate with lower urinary tract symptoms: Secondary | ICD-10-CM | POA: Diagnosis not present

## 2021-12-18 ENCOUNTER — Other Ambulatory Visit: Payer: Self-pay

## 2021-12-18 NOTE — Patient Outreach (Signed)
[facility]) Care Management  12/18/2021  [name] [birth year] [id number]     Case Closure  Case is being transferred to [place] services. Assigned RN CM will outreach and follow up with patient.    Enzo Montgomery, RN,BSN,CCM [place] Management Telephonic Care Management Coordinator Direct Phone: [phone] Toll Free: [phone] Fax: [phone]

## 2021-12-26 DIAGNOSIS — I700 Atherosclerosis of aorta: Secondary | ICD-10-CM | POA: Diagnosis not present

## 2021-12-26 DIAGNOSIS — E1169 Type 2 diabetes mellitus with other specified complication: Secondary | ICD-10-CM | POA: Diagnosis not present

## 2021-12-26 DIAGNOSIS — G3184 Mild cognitive impairment, so stated: Secondary | ICD-10-CM | POA: Diagnosis not present

## 2021-12-26 DIAGNOSIS — I10 Essential (primary) hypertension: Secondary | ICD-10-CM | POA: Diagnosis not present

## 2021-12-26 DIAGNOSIS — Z23 Encounter for immunization: Secondary | ICD-10-CM | POA: Diagnosis not present

## 2021-12-26 DIAGNOSIS — E785 Hyperlipidemia, unspecified: Secondary | ICD-10-CM | POA: Diagnosis not present

## 2021-12-26 DIAGNOSIS — F330 Major depressive disorder, recurrent, mild: Secondary | ICD-10-CM | POA: Diagnosis not present

## 2021-12-29 NOTE — Progress Notes (Signed)
OXIMETRY: Total sleep time spent at, or below 88% was 56.7 minutes, or 24.9% of total sleep time.  The oxyhemoglobin desaturation sleep was at nadir at 80%, from a mean saturation of 90%. Oxygen was added at 1.47 AM - at 2 liters /minute while under CPAP therapy.  IMPRESSION:   1.Sleep-disordered breathing improved somewhat at the finally explored pressure of 10cmH2O with 2 L of oxygen . Hypoxemia had not been resolvedwith PAP therapyaloneand oxygen had to be added. Proof was delivered with withdrawal of oxygenduring CPAPtherapyat 10 cmwater andshowed again severe hypoxemia.  2.Total sleep time was severelyreduced. The quality of sleep did change marginally with positive airway pressure and oxygen, allowing REM sleep breakthrough.   3. No significant periodic limb movements (PLMs) wereobserved. Most arousals were spontaneous.    RECOMMENDATIONS:   1) AUTO CPAP titrator device by a legacy manufacturerwill be ordered, from 6 through 12 cm water pressure, 1 cm EPR and heated humidification. In addition, thelarge SIMPLUS FFM of patient's choice will be provided or a comparable model.  2) 2liters of oxygen will be bled into the CPAP device. 3) This very fragmented sleep pattern can be seen with various neurodegenerative disorders, but can be related to pain, toPTSD and mental health problems, as well asto non-physiological sleep interruptions.   RV with SLEEP clinic provider between 30-90 days of therapy.    Recommended Settings: Jericho Alcorn, MD

## 2021-12-29 NOTE — Procedures (Signed)
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 He is a former smoker who returns for a in-lab titration following a PSG from 09-08-2021, which had documented  SEVERE HYPOXIA and severe sleep hypopnea.  Cyclic breathing was recorded, worse in REM sleep and in supine sleep position.  The Epworth Sleepiness Scale was endorsed at 18 out of 24 points (scores above or equal to 10 are suggestive of hypersomnolence) and the FSS at NA/ 63 points. ADDITIONAL INFORMATION:  Height: 69.0 in Weight: 183 lb (BMI 27) Neck Size: 16.0 in    MEDICATIONS: Tylenol, Ventolin HFA, Norvasc, Lipitor, Aricept, Cosopt, Lexapro, ferrous sulfate, fish oil, Indocin, Levemir, Novolin R Relion, Xalatan eye drops, Victoza, Antivert, Namenda, Glucophage, Lopressor, Prednisolone Acetate, Flomax  TECHNICAL DESCRIPTION: A registered  sleep technologist ( RPSGT)  was in attendance for the duration of the recording.  Data collection, scoring, video monitoring, and reporting were performed in compliance with the AASM Manual for the Scoring of Sleep and Associated Events; (Hypopnea is scored based on the criteria listed in Section VIII D. 1b in the AASM Manual V2.6 using a 4% oxygen desaturation rule or Hypopnea is scored based on the criteria listed in Section VIII D. 1a in the AASM Manual V2.6 using 3% oxygen desaturation and /or arousal rule).     SLEEP CONTINUITY AND SLEEP ARCHITECTURE:  Lights off was at 21:54: and lights on 04:45: (6.8 hours in bed). Total sleep time was  184.5 minutes with a decreased sleep efficiency at 44.9%. The patient was fitted with a large SIMPLUS FFM started at 5 cm water pressure and advanced to 10 cm water pressure, with an AHI of 1.6/h. Before the pressure of 5 cm water was increased to 7 cm, oxygen had to be added.  The patient tolerated best a pressure of CPAP 10 cm water with added oxygen, reached a sleep efficiency of  75%. Oxygen was briefly taken off to demonstrate the need of supplementation, which immediately let to a lower sleep efficiency and hypoxia time of 80%.   Sleep latency was normal at 19.5 minutes. Wake after sleep onset (WASO) time accounted for 207 minutes.  Of the total sleep time, the percentage of stage N1 sleep was 54.2%, stage N2 sleep was 40.9%, stage N3 sleep was 0.0%, and REM sleep was 4.9%.  There was only 1 Stage R period observed on this study night, 41 awakenings (i.e. transitions to Stage W from any sleep stage), and 126.0 total stage transitions.   RESPIRATORY MONITORING:  Based on CMS criteria (using a 4% oxygen desaturation rule for scoring hypopneas), there were 5 apneas (4 obstructive; 1 central; 0 mixed), and 12 hypopneas.  BODY POSITION: Duration of total sleep and percent of total sleep in their respective position is as follows: supine 121 minutes (65.6%), non-supine 63.5 minutes (34.4%); right 63 minutes (34.4%), left 00 minutes (0.0%), and prone 00 minutes (0.0%). Total supine REM sleep time was 02 minutes (22.2% of total REM sleep). The total AHI  (apnea-hypopnea  index) was 5.5/h overall . The Apnea index was 1.6/h. Hypopnea index was 3.9/h.  This was to be divided into : 7.4/h  supine AHI and  1.8/h  non-supine AHI,  and 0.0/h in  REM sleep, versus AHI of 6.9/h in NREM sleep.    OXIMETRY: Total sleep time spent at, or below 88% was 56.7 minutes, or 24.9% of total sleep time.  The oxyhemoglobin desaturation sleep was at nadir at 80%, from a mean saturation of 90%. Oxygen was added at 1.47 AM - at 2  liters /minute while under CPAP therapy.  Snoring was classified as mild . LIMB MOVEMENTS: There were 0 periodic limb movements of sleep (0.0/h), of which 0 (0.0/h) were associated with an arousal. There were many limb movements , but non -periodic. AROUSALS: There were 35 arousals in total, for an arousal index of 11.4 /hour.  Of these, 4 were identified as respiratory-related arousals (1.3 /h), 0 were PLM-related arousals (0.0 /h), and 55 were non-specific arousals (17.9 /h) EKG:  documented a heart rate of 62 through 75 bpm, with a mean heart arte of 67 bpm. NSR with PVCs.  EEG: Symmetric, slowed  and of elevated amplitude.  AUDIO and VIDEO: Sleep was very fragmented with only minutes of REM sleep, but improved under oxygen supplementation. There was not enough REM sleep seen to document REM BD.     IMPRESSION:   1. Sleep-disordered breathing improved somewhat at the finally explored pressure of 10 cmH2O with 2 L of oxygen . Hypoxemia had not been resolved with PAP therapy alone and oxygen had to be added.  Proof was delivered with withdrawal of oxygen during CPAP therapy at 10 cm water and showed again severe hypoxemia .   2. Total sleep time was severely reduced.  The quality of  sleep did change marginally with positive airway pressure and oxygen, allowing REM sleep breakthrough.    3. No significant periodic limb movements (PLMs) were observed. Most arousals were spontaneous.      RECOMMENDATIONS:    1) AUTO CPAP titrator device by a legacy manufacturer will be ordered, from 6 through 12 cm water pressure, 1 cm EPR and heated humidification. In addition, the large SIMPLUS FFM of patient's choice will be provided or a comparable model.  2) 2 liters of oxygen will be bled into the CPAP device. 3) This very fragmented sleep pattern can be seen with various neurodegenerative disorders, but can be related to pain, to PTSD and mental health problems, as well as to non-physiological sleep  interruptions.    RV with SLEEP clinic provider between 30-90 days of therapy.    Recommended Settings:  Larey Seat,  MD

## 2021-12-29 NOTE — Addendum Note (Signed)
Addended by: Larey Seat on: 12/29/2021 03:26 PM   Modules accepted: Orders

## 2022-01-02 ENCOUNTER — Telehealth: Payer: Self-pay | Admitting: Neurology

## 2022-01-02 NOTE — Telephone Encounter (Signed)
-----   Message from Larey Seat, MD sent at 12/29/2021  3:25 PM EDT ----- OXIMETRY: Total sleep time spent at, or below 88% was 56.7 minutes, or 24.9% of total sleep time.  The oxyhemoglobin desaturation sleep was at nadir at 80%, from a mean saturation of 90%. Oxygen was added at 1.47 AM - at 2 liters /minute while under CPAP therapy.  IMPRESSION:   1.Sleep-disordered breathing improved somewhat at the finally explored pressure of 10cmH2O with 2 L of oxygen . Hypoxemia had not been resolvedwith PAP therapyaloneand oxygen had to be added. Proof was delivered with withdrawal of oxygenduring CPAPtherapyat 10 cmwater andshowed again severe hypoxemia.  2.Total sleep time was severelyreduced. The quality of sleep did change marginally with positive airway pressure and oxygen, allowing REM sleep breakthrough.   3. No significant periodic limb movements (PLMs) wereobserved. Most arousals were spontaneous.    RECOMMENDATIONS:   1) AUTO CPAP titrator device by a legacy manufacturerwill be ordered, from 6 through 12 cm water pressure, 1 cm EPR and heated humidification. In addition, thelarge SIMPLUS FFM of patient's choice will be provided or a comparable model.  2) 2liters of oxygen will be bled into the CPAP device. 3) This very fragmented sleep pattern can be seen with various neurodegenerative disorders, but can be related to pain, toPTSD and mental health problems, as well asto non-physiological sleep interruptions.   RV with SLEEP clinic provider between 30-90 days of therapy.    Recommended Settings: CarmenDohmeier, MD

## 2022-01-02 NOTE — Telephone Encounter (Addendum)
Took call from phone staff/Jael and and went over sleep study results with wife. She verbalized understanding. She states sometimes pt memory not good and she prefers to take results. She was at appt when pt spoke w/ Myriam Jacobson about results earlier. Made note to call wife for future results.

## 2022-01-02 NOTE — Telephone Encounter (Signed)
I called pt. I advised pt that Dr. Brett Fairy reviewed their sleep study results and found that pt has sleep apnea and was treated with a CPAP. Dr. Brett Fairy recommends that pt starts CPAP with 2 L oxygen bled into the machine . I reviewed PAP compliance expectations with the pt. Pt is agreeable to starting a CPAP. I advised pt that an order will be sent to a DME, [facility], and [facility] will call the pt within about one week after they file with the pt's insurance. [facility] will show the pt how to use the machine, fit for masks, and troubleshoot the CPAP if needed. A follow up appt was made for insurance purposes with [employer]  on 03/18/2022 at 12:45 pm. Pt verbalized understanding to arrive 15 minutes early and bring their CPAP. A letter with all of this information in it will be mailed to the pt as a reminder. I verified with the pt that the address we have on file is correct. Pt verbalized understanding of results. Pt had no questions at this time but was encouraged to call back if questions arise. I have sent the order to [facility]  and have received confirmation that they have received the order.

## 2022-01-16 DIAGNOSIS — N3281 Overactive bladder: Secondary | ICD-10-CM | POA: Diagnosis not present

## 2022-01-16 DIAGNOSIS — N401 Enlarged prostate with lower urinary tract symptoms: Secondary | ICD-10-CM | POA: Diagnosis not present

## 2022-01-16 DIAGNOSIS — R351 Nocturia: Secondary | ICD-10-CM | POA: Diagnosis not present

## 2022-02-06 ENCOUNTER — Telehealth: Payer: Self-pay

## 2022-02-06 NOTE — Patient Outreach (Signed)
  Care Coordination   02/06/2022 Name: [name] MRN: [id number] DOB: 05-28-1940   Care Coordination Outreach Attempts:  An unsuccessful telephone outreach was attempted today to offer the patient information about available care coordination services as a benefit of their health plan.   Follow Up Plan:  Additional outreach attempts will be made to offer the patient care coordination information and services.   Encounter Outcome:  No Answer  Care Coordination Interventions Activated:  No   Care Coordination Interventions:  No, not indicated    Lazaro Arms RN, BSN, [facility]   Phone: [phone]

## 2022-02-12 ENCOUNTER — Ambulatory Visit: Payer: Medicare HMO | Admitting: Pulmonary Disease

## 2022-02-12 ENCOUNTER — Ambulatory Visit (INDEPENDENT_AMBULATORY_CARE_PROVIDER_SITE_OTHER): Payer: Medicare HMO | Admitting: Pulmonary Disease

## 2022-02-12 ENCOUNTER — Encounter: Payer: Self-pay | Admitting: Pulmonary Disease

## 2022-02-12 VITALS — BP 136/64 | HR 71 | Temp 98.4°F | Ht 72.0 in | Wt 181.2 lb

## 2022-02-12 DIAGNOSIS — R0609 Other forms of dyspnea: Secondary | ICD-10-CM

## 2022-02-12 LAB — PULMONARY FUNCTION TEST
DL/VA % pred: 58 %
DL/VA: 2.28 ml/min/mmHg/L
DLCO cor % pred: 43 %
DLCO cor: 10.4 ml/min/mmHg
DLCO unc % pred: 41 %
DLCO unc: 9.76 ml/min/mmHg
RV % pred: 121 %
RV: 3.14 L
TLC % pred: 70 %
TLC: 4.86 L

## 2022-02-12 NOTE — Progress Notes (Signed)
foll

## 2022-02-12 NOTE — Patient Instructions (Signed)
Attempted Full PFT Today. DLCO and Lung Volumes Performed Today.

## 2022-02-12 NOTE — Patient Instructions (Signed)
Follow up in 6 months 

## 2022-02-12 NOTE — Progress Notes (Signed)
Attempted Full PFT Today. DLCO and Lung Volumes Performed Today.

## 2022-02-12 NOTE — Progress Notes (Signed)
[facility]  Chief Complaint  Patient presents with   Follow-up    Pt states hes doing okay.    Past Surgical History:  He  has a past surgical history that includes knee replacement surgery (1999); Cataract extraction (Bilateral, 2011); Bladder surgery; and Prostatectomy (2013).  Past Medical History:  Bladder cancer, Cataracts, DM type 2, HLD, HTN, Sarcoidosis, Prostate cancer, Glaucoma  Constitutional:  BP 136/64 (BP Location: Left Arm, Patient Position: Sitting, Cuff Size: Normal)   Pulse 71   Temp 98.4 F (36.9 C) (Oral)   Ht 6' (1.829 m)   Wt 181 lb 3.2 oz (82.2 kg)   SpO2 95%   BMI 24.58 kg/m   Brief Summary:  [name] is a 81 y.o. male former smoker with dyspnea.      Subjective:   He tried doing PFT, but was not able to successfully do test maneuvers.  Chest xray from 12/11/21 was normal.  He seems to have difficult time understanding questions, and his wife has to assist with this.   He is not having cough, wheeze, sputum, or chest congestion recently.  He has tried using albuterol but doesn't feel this helps much.    He doesn't understand why he uses a machine for his breathing at night.  Physical Exam:   Appearance - well kempt   ENMT - no sinus tenderness, no oral exudate, no LAN, Mallampati 3 airway, no stridor  Respiratory - equal breath sounds bilaterally, no wheezing or rales  CV - s1s2 regular rate and rhythm, no murmurs  Ext - no clubbing, no edema  Skin - no rashes  Psych - normal mood and affect    Pulmonary testing:    Chest Imaging:  CT chest 07/13/06 >> b/l mediastinal and hilar LAN, subpleural nodules, interstitial changes (suggestive of sarcoidosis)  Sleep Tests:  PSG 09/08/21 >> AHI 35.4, SpO2 low 82%  Cardiac Tests:    Social History:  He  reports that he quit smoking about 16 years ago. His smoking use included cigarettes. He smoked an average of .5 packs per day. He has  never used smokeless tobacco. He reports that he does not drink alcohol and does not use drugs.  Family History:  His family history includes Coronary artery disease in his brother and father; Diabetes Mellitus I in his brother, brother, and mother; Hypertension in his brother, father, mother, and sister.     Assessment/Plan:   Dyspnea on exertion. - symptoms are minimal at this time - recent chest xray was normal - he was not able to complete PFT test maneuvers - he can use albuterol prn - discussed symptoms to monitor for that would indicate a needed for additional assessment and therapy  Obstructive sleep apnea. - advised him to discuss this with his providers at [facility]  Time Spent Involved in Patient Care on Day of Examination:  26 minutes  Follow up:   Patient Instructions  Follow up in 6 months  Medication List:   Allergies as of 02/12/2022       Reactions   Ace Inhibitors Other (See Comments)   angioedema   Allopurinol Rash        Medication List        Accurate as of February 12, 2022 11:27 AM. If you have any questions, ask your nurse or doctor.          STOP taking these medications    PREDNISOLONE ACETATE OP Stopped by:  Chesley Mires, MD       TAKE these medications    Accu-Chek Aviva Plus test strip Generic drug: glucose blood USE AS INSTRUCTED TO CHECK BLOOD SUGAR THREE TIMES DAILY   Accu-Chek Aviva Plus w/Device Kit Use to check blood sugar 3 times per day dx code E11.65   albuterol 108 (90 Base) MCG/ACT inhaler Commonly known as: VENTOLIN HFA Inhale 1-2 puffs into the lungs every 4 (four) hours as needed for wheezing or shortness of breath.   amLODipine 10 MG tablet Commonly known as: NORVASC Take 1 tablet (10 mg total) by mouth daily.   aspirin 81 MG tablet Take 81 mg by mouth at bedtime.   atorvastatin 20 MG tablet Commonly known as: LIPITOR Take 20 mg by mouth daily.   benzonatate 100 MG capsule Commonly  known as: TESSALON Take 1 capsule by mouth every 8 (eight) hours for cough.   carbidopa-levodopa 25-100 MG tablet Commonly known as: SINEMET IR Take half a pill twice daily (8 AM and noon) for one week, then half a pill 3 times a day (8 AM, noon, and 4 PM) for one week, then one pill 3 times a day   donepezil 10 MG tablet Commonly known as: ARICEPT Take 1 tablet (10 mg total) by mouth at bedtime.   dorzolamide-timolol 2-0.5 % ophthalmic solution Commonly known as: COSOPT Place 1 drop into both eyes 2 (two) times daily.   escitalopram 10 MG tablet Commonly known as: Lexapro Take 1 tablet (10 mg total) by mouth daily.   ferrous sulfate 325 (65 FE) MG tablet Take 1 tablet (325 mg total) by mouth 2 (two) times daily.   fish oil-omega-3 fatty acids 1000 MG capsule Take 1 g by mouth daily.   Gemtesa 75 MG Tabs Generic drug: Vibegron Take 1 tablet by mouth daily.   hydroxypropyl methylcellulose / hypromellose 2.5 % ophthalmic solution Commonly known as: ISOPTO TEARS / GONIOVISC Place 1 drop into both eyes 4 (four) times daily as needed for dry eyes.   indomethacin 50 MG capsule Commonly known as: INDOCIN Take 1 capsule (50 mg total) by mouth 2 (two) times daily as needed (gout). Take with food   insulin regular 100 units/mL injection Commonly known as: NovoLIN R ReliOn Inject 0.05-0.15 mLs (5-15 Units total) into the skin 3 (three) times daily with meals as needed for high blood sugar (per sliding scale).   INSULIN SYRINGE .5CC/31GX5/16" 31G X 5/16" 0.5 ML Misc Use one to inject insulin daily   latanoprost 0.005 % ophthalmic solution Commonly known as: XALATAN Place 1 drop into both eyes at bedtime.   meclizine 25 MG tablet Commonly known as: ANTIVERT Take 1 tablet (25 mg total) by mouth 3 (three) times daily as needed for dizziness.   memantine 10 MG tablet Commonly known as: NAMENDA Take 1 tablet by mouth twice daily   metFORMIN 1000 MG tablet Commonly known as:  GLUCOPHAGE Take 1 tablet (1,000 mg total) by mouth 2 (two) times daily with a meal. What changed: when to take this   metoprolol tartrate 25 MG tablet Commonly known as: LOPRESSOR Take 1 tablet (25 mg total) by mouth 2 (two) times daily.   tamsulosin 0.4 MG Caps capsule Commonly known as: FLOMAX Take 1 capsule (0.4 mg total) by mouth at bedtime.        Signature:  Chesley Mires, MD [place] Pager - [phone] 02/12/2022, 11:27 AM

## 2022-02-27 ENCOUNTER — Telehealth: Payer: Self-pay

## 2022-02-27 NOTE — Patient Outreach (Signed)
  Care Coordination   02/27/2022 Name: [name] MRN: [id number] DOB: Nov 28, 1940   Care Coordination Outreach Attempts:  A second unsuccessful outreach was attempted today to offer the patient with information about available care coordination services as a benefit of their health plan.     Follow Up Plan:  Additional outreach attempts will be made to offer the patient care coordination information and services.   Encounter Outcome:  No Answer  Care Coordination Interventions Activated:  No   Care Coordination Interventions:  No, not indicated    Lazaro Arms RN, BSN, [facility]   Phone: [phone]

## 2022-03-18 ENCOUNTER — Encounter: Payer: Self-pay | Admitting: Neurology

## 2022-03-18 ENCOUNTER — Ambulatory Visit: Payer: Medicare HMO | Admitting: Neurology

## 2022-03-18 VITALS — BP 138/66 | HR 81 | Ht 72.0 in | Wt 181.0 lb

## 2022-03-18 DIAGNOSIS — G4733 Obstructive sleep apnea (adult) (pediatric): Secondary | ICD-10-CM | POA: Diagnosis not present

## 2022-03-18 DIAGNOSIS — G4719 Other hypersomnia: Secondary | ICD-10-CM | POA: Diagnosis not present

## 2022-03-18 DIAGNOSIS — R0902 Hypoxemia: Secondary | ICD-10-CM

## 2022-03-18 NOTE — Progress Notes (Signed)
Patient: [name] Date of Birth: 1940/09/27  Reason for Visit: Follow up History from: Patient, wife Primary Neurologist: Dr. Georges Mouse; Dr. Oliver Barre  ASSESSMENT AND PLAN 81 y.o. year old male   1.  OSA with hypoxia on CPAP with 2 L oxygen -Encouraged to continue nightly CPAP use, discussed the importance of using greater than 4 hours -I will order a mask refit, order sent to DME, will pull a download in 6 weeks -He feels better with CPAP, better sleep quality, more rested -CPAP follow-up in 6 months  HISTORY OF PRESENT ILLNESS: Today 03/18/22 [name] is here today for initial CPAP follow-up.  Sleep study in June 2023 showed severe apnea and hypoxia.  Recommended full night titration study.  Hypoxia was also present without apnea.  Titration study in September 2023.  Did well on 10 cmH2O with 2L oxygen.  Recommended auto CPAP 6-12 cm water pressure.  2 L of oxygen bled into CPAP. He feels less tired, sleeps better. Some nights wakes up with mask leaking after a few hours. Using full face mask.  Data below shows overall good compliance, but could improve but greater than 4 hours nightly use.  He does have a slight leak in his mask at 27.0. ESS was 23 but they had a hard time understanding the questionnaire.     HISTORY Dr. Brett Fairy 06/26/21: [name] is a 81 y.o.  African American male patient seen here upon referral from Dr Billey Gosling, who follows him for a memory loss disorder-  Seen on 06/26/2021  for a non restorative sleep, REM BD and snoring with witnessed apnea. .  Chief concern according to patient : 06-26-2021 / Referred by Dr. Billey Gosling for sleep concerns. Pt c/o of snoring witnessed sleep apnea, daytime sleepiness and overall not sleep well at night. Here to r/o OSA and REM behavior disorder.   I have the pleasure of seeing [name] in the presence of his wife . He  has a past medical history of Bladder cancer ([place]), Cataracts, bilateral, poorly controlled Diabetes  mellitus with insulin, Gout, Hearing loss, Heart disease, unspecified, Hypercholesterolemia, Hypertension, advanced Dementia , Prostate cancer ([place]), Sarcoidosis, and Unspecified glaucoma(365.9). he ambulates with a cane, his wife filled all paperwork and questionnaires for him.  Dr. [place] noted that the patient scored in a Mini-Mental status examination 17 out of 30 points.  He will have some day-to-day forgetfulness forgetting chores at home he has gotten has forgotten conversations that have taken place a day earlier, his long-term memory reportedly was still considered intact, he had been forgetting where he was supposed to go when he was driving.  He was placed on donepezil 10 mg for about a year when Dr. Donneta Romberg saw him last on 05 June 2021.  There were word finding difficulties endorsed he is no longer driving his wife has always handled the finances, his wife also does the grocery shopping and runs errands.  His medication list was briefly reviewed.  He is on insulin.     There is no family history of dementia mentioned.     There has been no weight loss or change since last visit, he has been borderline anemic as of December 2022.  His hemoglobin A1c was quoted at 10 which would have been very high but this date of this test was 7-8 2019.  An MRI of the brain had been done in 2018 showing chronic microvascular Changes.  Dr. Billey Gosling had added Namenda.   Sleep relevant medical history:  Nocturia 3-4 times. Not a sleep walker, has not fallen out of bed,  only sleep talking.   No Parasomnia, sleeps a lot in daytime. Sometimes snoring. Wife did not respond my question  of disorientation at night?  Wife reports coughing at night, not apnea.     Family medical /sleep history: has 2 sisters and one brother - no other family member on CPAP with OSA, Social history:  Patient is retired from Statistician-  and lives in a household with spouse,  Pets are not present. Tobacco use; none now.   ETOH use ;  none - used to drink socially-  Caffeine intake in form of Coffee( 1 mug in AM ).   Sleep habits are as follows: The patient's dinner time is between 7 PM. The patient goes to bed at 10 PM and reports he falls asleep quickly-  continues to sleep for 2-3 hours, wakes for many bathroom breaks. The preferred sleep position is sideways, but wife reports snoring present when in supine . with the support of 2 pillows. Dreams are reportedly frequent. The couple denies vivid dreams or enactment of dreams.  9  AM is the usual rise time. Breakfast is after 9 AM, "insulin when he gets up at 9 AM " then states; you cant take insulin if you haven eaten". The patient wakes up spontaneously. He reports feeling refreshed or restored in AM. Naps are taken after lunch, then he sleeps another 2-3 hours.   REVIEW OF SYSTEMS: Out of a complete 14 system review of symptoms, the patient complains only of the following symptoms, and all other reviewed systems are negative.  See HPI  ALLERGIES: Allergies  Allergen Reactions   Ace Inhibitors Other (See Comments)    angioedema   Allopurinol Rash    HOME MEDICATIONS: Outpatient Medications Prior to Visit  Medication Sig Dispense Refill   ACCU-CHEK AVIVA PLUS test strip USE AS INSTRUCTED TO CHECK BLOOD SUGAR THREE TIMES DAILY 300 each 1   albuterol (VENTOLIN HFA) 108 (90 Base) MCG/ACT inhaler Inhale 1-2 puffs into the lungs every 4 (four) hours as needed for wheezing or shortness of breath. 1 each 0   amLODipine (NORVASC) 10 MG tablet Take 1 tablet (10 mg total) by mouth daily. 30 tablet 1   aspirin 81 MG tablet Take 81 mg by mouth at bedtime.      atorvastatin (LIPITOR) 20 MG tablet Take 20 mg by mouth daily.     benzonatate (TESSALON) 100 MG capsule Take 1 capsule by mouth every 8 (eight) hours for cough. 21 capsule 0   Blood Glucose Monitoring Suppl (ACCU-CHEK AVIVA PLUS) w/Device KIT Use to check blood sugar 3 times per day dx code E11.65 1 kit 0    carbidopa-levodopa (SINEMET IR) 25-100 MG tablet Take half a pill twice daily (8 AM and noon) for one week, then half a pill 3 times a day (8 AM, noon, and 4 PM) for one week, then one pill 3 times a day 90 tablet 6   donepezil (ARICEPT) 10 MG tablet Take 1 tablet (10 mg total) by mouth at bedtime. 30 tablet 1   dorzolamide-timolol (COSOPT) 22.3-6.8 MG/ML ophthalmic solution Place 1 drop into both eyes 2 (two) times daily.      escitalopram (LEXAPRO) 10 MG tablet Take 1 tablet (10 mg total) by mouth daily. 30 tablet 6   ferrous sulfate 325 (65 FE) MG tablet Take 1 tablet (325 mg total) by mouth 2 (two) times daily. 60 tablet  3   fish oil-omega-3 fatty acids 1000 MG capsule Take 1 g by mouth daily.      GEMTESA 75 MG TABS Take 1 tablet by mouth daily.     hydroxypropyl methylcellulose / hypromellose (ISOPTO TEARS / GONIOVISC) 2.5 % ophthalmic solution Place 1 drop into both eyes 4 (four) times daily as needed for dry eyes.      indomethacin (INDOCIN) 50 MG capsule Take 1 capsule (50 mg total) by mouth 2 (two) times daily as needed (gout). Take with food 20 capsule 1   insulin regular (NOVOLIN R RELION) 100 units/mL injection Inject 0.05-0.15 mLs (5-15 Units total) into the skin 3 (three) times daily with meals as needed for high blood sugar (per sliding scale). 10 mL 2   Insulin Syringe-Needle U-100 (INSULIN SYRINGE .5CC/31GX5/16") 31G X 5/16" 0.5 ML MISC Use one to inject insulin daily 30 each 1   latanoprost (XALATAN) 0.005 % ophthalmic solution Place 1 drop into both eyes at bedtime.      meclizine (ANTIVERT) 25 MG tablet Take 1 tablet (25 mg total) by mouth 3 (three) times daily as needed for dizziness. 30 tablet 0   memantine (NAMENDA) 10 MG tablet Take 1 tablet by mouth twice daily 60 tablet 3   metFORMIN (GLUCOPHAGE) 1000 MG tablet Take 1 tablet (1,000 mg total) by mouth 2 (two) times daily with a meal. (Patient taking differently: Take 1,000 mg by mouth daily.) 60 tablet 2   metoprolol tartrate  (LOPRESSOR) 25 MG tablet Take 1 tablet (25 mg total) by mouth 2 (two) times daily. 60 tablet 2   tamsulosin (FLOMAX) 0.4 MG CAPS capsule Take 1 capsule (0.4 mg total) by mouth at bedtime. 30 capsule 1   No facility-administered medications prior to visit.    PAST MEDICAL HISTORY: Past Medical History:  Diagnosis Date   Bladder cancer ([place])    in situ   Cataracts, bilateral    Diabetes mellitus    insulin dependent   Gout    Hearing loss    Heart disease, unspecified    Hypercholesterolemia    Hypertension    Memory changes    Prostate cancer ([place])    s/p radiation   Sarcoidosis    Unspecified glaucoma(365.9)     PAST SURGICAL HISTORY: Past Surgical History:  Procedure Laterality Date   BLADDER SURGERY     for carcinoma   CATARACT EXTRACTION Bilateral 2011   knee replacement surgery  1999   right   PROSTATECTOMY  2013    FAMILY HISTORY: Family History  Problem Relation Age of Onset   Hypertension Mother    Diabetes Mellitus I Mother    Hypertension Father    Coronary artery disease Father    Hypertension Sister    Hypertension Brother    Coronary artery disease Brother    Diabetes Mellitus I Brother    Diabetes Mellitus I Brother    Cancer Neg Hx     SOCIAL HISTORY: Social History   Socioeconomic History   Marital status: Married    Spouse name: Opal Sidles   Number of children: 3   Years of education: 9   Highest education level: 9th grade  Occupational History    Comment: retired Research scientist (medical)  Tobacco Use   Smoking status: Former    Packs/day: 0.50    Types: Cigarettes    Quit date: 04/21/2005    Years since quitting: 16.9   Smokeless tobacco: Never  Substance and Sexual Activity   Alcohol use: No  Drug use: No   Sexual activity: Not Currently  Other Topics Concern   Not on file  Social History Narrative   06/05/21 Lives with wife   Right handed   Caffeine: 1 C of coffee a day   Social Determinants of Health   Financial Resource Strain: Not on  file  Food Insecurity: No Food Insecurity (04/26/2021)   Hunger Vital Sign    Worried About Running Out of Food in the Last Year: Never true    Ran Out of Food in the Last Year: Never true  Transportation Needs: No Transportation Needs (04/26/2021)   PRAPARE - Hydrologist (Medical): No    Lack of Transportation (Non-Medical): No  Physical Activity: Not on file  Stress: Not on file  Social Connections: Not on file  Intimate Partner Violence: Not on file   PHYSICAL EXAM  Vitals:   03/18/22 1247  BP: 138/66  Pulse: 81  Weight: 181 lb (82.1 kg)  Height: 6' (1.829 m)   Body mass index is 24.55 kg/m.  Generalized: Well developed, in no acute distress  Neurological examination  Mentation: Alert, wife provides most history. Follows all commands speech and language fluent Cranial nerve II-XII: Pupils were equal round reactive to light. Extraocular movements were full, visual field were full on confrontational test. Facial sensation and strength were normal.Head turning and shoulder shrug  were normal and symmetric. Motor: No significant muscle Sensory: Sensory testing is intact to soft touch on all 4 extremities. No evidence of extinction is noted.  Coordination: Cerebellar testing reveals good finger-nose-finger and heel-to-shin bilaterally. Is slow to perform. Gait and station: Gait is cautious, slow.   DIAGNOSTIC DATA (LABS, IMAGING, TESTING) - I reviewed patient records, labs, notes, testing and imaging myself where available.  Lab Results  Component Value Date   WBC 5.1 03/24/2021   HGB 12.6 (L) 03/24/2021   HCT 38.5 (L) 03/24/2021   MCV 78.7 (L) 03/24/2021   PLT 179 03/24/2021      Component Value Date/Time   NA 141 03/24/2021 1057   K 3.5 03/24/2021 1057   CL 105 03/24/2021 1057   CO2 27 03/24/2021 1057   GLUCOSE 163 (H) 03/24/2021 1057   BUN 13 03/24/2021 1057   CREATININE 1.06 03/24/2021 1057   CALCIUM 9.0 03/24/2021 1057   PROT 8.2 (H)  03/24/2021 1057   ALBUMIN 4.0 03/24/2021 1057   AST 25 03/24/2021 1057   ALT 24 03/24/2021 1057   ALKPHOS 64 03/24/2021 1057   BILITOT 0.8 03/24/2021 1057   GFRNONAA >60 03/24/2021 1057   GFRAA >60 10/30/2017 0437   Lab Results  Component Value Date   CHOL 137 09/22/2016   HDL 37.30 (L) 09/22/2016   LDLCALC 81 09/22/2016   TRIG 89.0 09/22/2016   CHOLHDL 4 09/22/2016   Lab Results  Component Value Date   HGBA1C 10.0 (H) 10/26/2017   Lab Results  Component Value Date   VWUJWJXB14 782 06/05/2021   Lab Results  Component Value Date   TSH 4.310 06/05/2021    Butler Denmark, AGNP-C, DNP 03/18/2022, 1:15 PM [facility] [address], [place], [place] [address] [phone]

## 2022-03-18 NOTE — Patient Instructions (Signed)
I will order mask refit I will check a download in 6 weeks, will see you back in 6 months for CPAP Please continue nightly use for > 4 hours

## 2022-03-19 NOTE — Progress Notes (Unsigned)
   CC:  dementia  Follow-up Visit  Last visit: 09/18/21  Brief HPI: 81 year old male with a history of OSA, HTN, HLD, diabetes who follows in clinic for memory loss.   At his last visit, Lexapro was increased to 10 mg daily. He was started on Sinemet trial for tremor and bradykinesia.  Interval History: PSG revealed OSA and he was started on CPAP. This has improved his daytime somnolence. Memory*** Namenda***donepezil***  Gait***tremor***Sinemet***  Mood***Lexapro***  Physical Exam:   Vital Signs: There were no vitals taken for this visit. GENERAL:  well appearing, in no acute distress, alert  SKIN:  Color, texture, turgor normal. No rashes or lesions HEAD:  Normocephalic/atraumatic. RESP: normal respiratory effort MSK:  No gross joint deformities.   NEUROLOGICAL: Mental Status: Alert, oriented to person, place and time, Follows commands, and Speech fluent and appropriate. Cranial Nerves: PERRL, face symmetric, no dysarthria, hearing grossly intact Motor: moves all extremities equally Gait: normal-based.  IMPRESSION: ***  PLAN: ***   Follow-up: ***  I spent a total of *** minutes on the date of the service. Discussed medication side effects, adverse reactions and drug interactions. Written educational materials and patient instructions outlining all of the above were given.  Genia Harold, MD

## 2022-03-20 ENCOUNTER — Ambulatory Visit: Payer: Medicare HMO | Admitting: Psychiatry

## 2022-03-20 ENCOUNTER — Encounter: Payer: Self-pay | Admitting: Psychiatry

## 2022-03-20 ENCOUNTER — Other Ambulatory Visit: Payer: Self-pay | Admitting: Neurology

## 2022-03-20 VITALS — BP 132/61 | HR 71 | Ht 72.0 in | Wt 181.0 lb

## 2022-03-20 DIAGNOSIS — G214 Vascular parkinsonism: Secondary | ICD-10-CM | POA: Diagnosis not present

## 2022-03-20 DIAGNOSIS — F32A Depression, unspecified: Secondary | ICD-10-CM | POA: Diagnosis not present

## 2022-03-20 DIAGNOSIS — F01B0 Vascular dementia, moderate, without behavioral disturbance, psychotic disturbance, mood disturbance, and anxiety: Secondary | ICD-10-CM | POA: Diagnosis not present

## 2022-03-20 MED ORDER — DONEPEZIL HCL 10 MG PO TABS: 10.0000 mg | ORAL_TABLET | Freq: Every day | ORAL | 3 refills | Status: DC

## 2022-03-20 MED ORDER — MEMANTINE HCL 10 MG PO TABS: 10.0000 mg | ORAL_TABLET | Freq: Two times a day (BID) | ORAL | 3 refills | Status: DC

## 2022-03-20 MED ORDER — ESCITALOPRAM OXALATE 10 MG PO TABS: 10.0000 mg | ORAL_TABLET | Freq: Every day | ORAL | 3 refills | Status: DC

## 2022-03-20 MED ORDER — CARBIDOPA-LEVODOPA 25-100 MG PO TABS: ORAL_TABLET | ORAL | 3 refills | Status: DC

## 2022-03-21 DIAGNOSIS — H26491 Other secondary cataract, right eye: Secondary | ICD-10-CM | POA: Diagnosis not present

## 2022-03-21 DIAGNOSIS — Z794 Long term (current) use of insulin: Secondary | ICD-10-CM | POA: Diagnosis not present

## 2022-03-21 DIAGNOSIS — H401112 Primary open-angle glaucoma, right eye, moderate stage: Secondary | ICD-10-CM | POA: Diagnosis not present

## 2022-03-21 DIAGNOSIS — H401123 Primary open-angle glaucoma, left eye, severe stage: Secondary | ICD-10-CM | POA: Diagnosis not present

## 2022-03-21 DIAGNOSIS — E119 Type 2 diabetes mellitus without complications: Secondary | ICD-10-CM | POA: Diagnosis not present

## 2022-03-21 DIAGNOSIS — Z961 Presence of intraocular lens: Secondary | ICD-10-CM | POA: Diagnosis not present

## 2022-03-26 ENCOUNTER — Ambulatory Visit: Payer: Medicare HMO | Admitting: Podiatry

## 2022-03-26 ENCOUNTER — Encounter: Payer: Self-pay | Admitting: Podiatry

## 2022-03-26 DIAGNOSIS — E1121 Type 2 diabetes mellitus with diabetic nephropathy: Secondary | ICD-10-CM | POA: Diagnosis not present

## 2022-03-26 DIAGNOSIS — B351 Tinea unguium: Secondary | ICD-10-CM

## 2022-03-26 DIAGNOSIS — M79675 Pain in left toe(s): Secondary | ICD-10-CM | POA: Diagnosis not present

## 2022-03-26 DIAGNOSIS — M79674 Pain in right toe(s): Secondary | ICD-10-CM

## 2022-03-26 DIAGNOSIS — Z794 Long term (current) use of insulin: Secondary | ICD-10-CM | POA: Diagnosis not present

## 2022-03-26 NOTE — Progress Notes (Signed)
This patient returns to my office for at risk foot care.  This patient requires this care by a professional since this patient will be at risk due to having type 2 diabetes.  This patient is unable to cut nails himself since the patient cannot reach his nails.These nails are painful walking and wearing shoes.  This patient presents for at risk foot care today.  General Appearance  Alert, conversant and in no acute stress.  Vascular  Dorsalis pedis and posterior tibial  pulses are palpable  bilaterally.  Capillary return is within normal limits  bilaterally. Temperature is within normal limits  bilaterally.  Neurologic  Senn-Weinstein monofilament wire test within normal limits  bilaterally. Muscle power within normal limits bilaterally.  Nails Thick disfigured discolored nails with subungual debris  from hallux to fifth toes bilaterally. No evidence of bacterial infection or drainage bilaterally.  Orthopedic  No limitations of motion  feet .  No crepitus or effusions noted.  No bony pathology or digital deformities noted.  Skin  normotropic skin with no porokeratosis noted bilaterally.  No signs of infections or ulcers noted.     Onychomycosis  Pain in right toes  Pain in left toes  Consent was obtained for treatment procedures.   Mechanical debridement of nails 1-5  bilaterally performed with a nail nipper.  Filed with dremel without incident.    Return office visit   4 months                   Told patient to return for periodic foot care and evaluation due to potential at risk complications.   Gardiner Barefoot DPM

## 2022-03-27 ENCOUNTER — Telehealth: Payer: Self-pay | Admitting: *Deleted

## 2022-03-27 NOTE — Progress Notes (Signed)
  Care Coordination Note  03/27/2022 Name: [name] MRN: [id number] DOB: 1941/02/11  [name] is a 81 y.o. year old male who is a primary care patient of [name], MD and is actively engaged with the care management team. I reached out to [name] by phone today to assist with re-scheduling an initial visit with the RN Case Manager  Follow up plan: Unsuccessful telephone outreach attempt made. A HIPAA compliant phone message was left for the patient providing contact information and requesting a return call.   [place]  Direct Dial: [phone]

## 2022-04-03 NOTE — Progress Notes (Signed)
  Care Coordination Note  04/03/2022 Name: Teagon Kron MRN: [id number] DOB: Dec 11, 1940  Amias Hutchinson is a 81 y.o. year old male who is a primary care patient of [name], MD and is actively engaged with the care management team. I reached out to [name] by phone today to assist with re-scheduling an initial visit with the RN Case Manager  Follow up plan: Patient declines further follow up and engagement by the care management team. Appropriate care team members and provider have been notified via electronic communication.   Patient states they are working with [facility].  [place]  Direct Dial: [phone]

## 2022-04-22 ENCOUNTER — Telehealth: Payer: Self-pay | Admitting: Neurology

## 2022-04-22 NOTE — Telephone Encounter (Signed)
Please call, follow-up on CPAP, I will order mask refit.  Is the mask more tolerable, less waking up during the night?  His download looks overall good.  AHI 3.4, leak 25.

## 2022-04-22 NOTE — Telephone Encounter (Signed)
Called and spoke to patient's wife, who said that he was sleeping better following a masking fitting he had recently, said he was doing better at night and the patient came on the call and agreed that he was sleeping better and waking up less throughout the night. States he does not need a mask refitting at the moment

## 2022-06-17 DIAGNOSIS — N401 Enlarged prostate with lower urinary tract symptoms: Secondary | ICD-10-CM | POA: Diagnosis not present

## 2022-06-24 DIAGNOSIS — C61 Malignant neoplasm of prostate: Secondary | ICD-10-CM | POA: Diagnosis not present

## 2022-06-24 DIAGNOSIS — N3281 Overactive bladder: Secondary | ICD-10-CM | POA: Diagnosis not present

## 2022-06-24 DIAGNOSIS — C678 Malignant neoplasm of overlapping sites of bladder: Secondary | ICD-10-CM | POA: Diagnosis not present

## 2022-06-25 DIAGNOSIS — F0150 Vascular dementia without behavioral disturbance: Secondary | ICD-10-CM | POA: Diagnosis not present

## 2022-06-25 DIAGNOSIS — I700 Atherosclerosis of aorta: Secondary | ICD-10-CM | POA: Diagnosis not present

## 2022-06-25 DIAGNOSIS — Z0000 Encounter for general adult medical examination without abnormal findings: Secondary | ICD-10-CM | POA: Diagnosis not present

## 2022-06-25 DIAGNOSIS — M109 Gout, unspecified: Secondary | ICD-10-CM | POA: Diagnosis not present

## 2022-06-25 DIAGNOSIS — F330 Major depressive disorder, recurrent, mild: Secondary | ICD-10-CM | POA: Diagnosis not present

## 2022-06-25 DIAGNOSIS — E785 Hyperlipidemia, unspecified: Secondary | ICD-10-CM | POA: Diagnosis not present

## 2022-06-25 DIAGNOSIS — L97919 Non-pressure chronic ulcer of unspecified part of right lower leg with unspecified severity: Secondary | ICD-10-CM | POA: Diagnosis not present

## 2022-06-25 DIAGNOSIS — D508 Other iron deficiency anemias: Secondary | ICD-10-CM | POA: Diagnosis not present

## 2022-06-25 DIAGNOSIS — G214 Vascular parkinsonism: Secondary | ICD-10-CM | POA: Diagnosis not present

## 2022-06-25 DIAGNOSIS — G4730 Sleep apnea, unspecified: Secondary | ICD-10-CM | POA: Diagnosis not present

## 2022-06-25 DIAGNOSIS — E11622 Type 2 diabetes mellitus with other skin ulcer: Secondary | ICD-10-CM | POA: Diagnosis not present

## 2022-06-25 DIAGNOSIS — I10 Essential (primary) hypertension: Secondary | ICD-10-CM | POA: Diagnosis not present

## 2022-07-15 DIAGNOSIS — R399 Unspecified symptoms and signs involving the genitourinary system: Secondary | ICD-10-CM | POA: Diagnosis not present

## 2022-07-15 DIAGNOSIS — R197 Diarrhea, unspecified: Secondary | ICD-10-CM | POA: Diagnosis not present

## 2022-07-18 DIAGNOSIS — F0153 Vascular dementia, unspecified severity, with mood disturbance: Secondary | ICD-10-CM | POA: Diagnosis not present

## 2022-07-18 DIAGNOSIS — E1169 Type 2 diabetes mellitus with other specified complication: Secondary | ICD-10-CM | POA: Diagnosis not present

## 2022-07-18 DIAGNOSIS — E1122 Type 2 diabetes mellitus with diabetic chronic kidney disease: Secondary | ICD-10-CM | POA: Diagnosis not present

## 2022-07-18 DIAGNOSIS — I129 Hypertensive chronic kidney disease with stage 1 through stage 4 chronic kidney disease, or unspecified chronic kidney disease: Secondary | ICD-10-CM | POA: Diagnosis not present

## 2022-07-18 DIAGNOSIS — N182 Chronic kidney disease, stage 2 (mild): Secondary | ICD-10-CM | POA: Diagnosis not present

## 2022-07-18 DIAGNOSIS — D631 Anemia in chronic kidney disease: Secondary | ICD-10-CM | POA: Diagnosis not present

## 2022-07-18 DIAGNOSIS — G214 Vascular parkinsonism: Secondary | ICD-10-CM | POA: Diagnosis not present

## 2022-07-18 DIAGNOSIS — E7800 Pure hypercholesterolemia, unspecified: Secondary | ICD-10-CM | POA: Diagnosis not present

## 2022-07-18 DIAGNOSIS — F330 Major depressive disorder, recurrent, mild: Secondary | ICD-10-CM | POA: Diagnosis not present

## 2022-07-22 ENCOUNTER — Emergency Department (HOSPITAL_COMMUNITY): Payer: Medicare HMO

## 2022-07-22 ENCOUNTER — Encounter (HOSPITAL_COMMUNITY): Payer: Self-pay | Admitting: Emergency Medicine

## 2022-07-22 ENCOUNTER — Other Ambulatory Visit: Payer: Self-pay

## 2022-07-22 ENCOUNTER — Inpatient Hospital Stay (HOSPITAL_COMMUNITY)
Admission: EM | Admit: 2022-07-22 | Discharge: 2022-07-25 | DRG: 393 | Disposition: A | Discharge status: Home / Self Care | Payer: Medicare HMO | Attending: Internal Medicine | Admitting: Internal Medicine

## 2022-07-22 ENCOUNTER — Ambulatory Visit (HOSPITAL_COMMUNITY)
Admission: EM | Admit: 2022-07-22 | Discharge: 2022-07-22 | Disposition: A | Discharge status: Home / Self Care | Payer: Medicare HMO | Attending: Internal Medicine | Admitting: Internal Medicine

## 2022-07-22 ENCOUNTER — Telehealth: Payer: Self-pay | Admitting: Pulmonary Disease

## 2022-07-22 DIAGNOSIS — R0902 Hypoxemia: Principal | ICD-10-CM

## 2022-07-22 DIAGNOSIS — E1169 Type 2 diabetes mellitus with other specified complication: Secondary | ICD-10-CM | POA: Diagnosis not present

## 2022-07-22 DIAGNOSIS — Z96651 Presence of right artificial knee joint: Secondary | ICD-10-CM | POA: Diagnosis present

## 2022-07-22 DIAGNOSIS — R198 Other specified symptoms and signs involving the digestive system and abdomen: Secondary | ICD-10-CM

## 2022-07-22 DIAGNOSIS — H409 Unspecified glaucoma: Secondary | ICD-10-CM | POA: Diagnosis present

## 2022-07-22 DIAGNOSIS — Z794 Long term (current) use of insulin: Secondary | ICD-10-CM

## 2022-07-22 DIAGNOSIS — Z1152 Encounter for screening for COVID-19: Secondary | ICD-10-CM | POA: Diagnosis not present

## 2022-07-22 DIAGNOSIS — G214 Vascular parkinsonism: Secondary | ICD-10-CM | POA: Diagnosis not present

## 2022-07-22 DIAGNOSIS — J9601 Acute respiratory failure with hypoxia: Secondary | ICD-10-CM | POA: Diagnosis present

## 2022-07-22 DIAGNOSIS — E1121 Type 2 diabetes mellitus with diabetic nephropathy: Secondary | ICD-10-CM | POA: Diagnosis not present

## 2022-07-22 DIAGNOSIS — R591 Generalized enlarged lymph nodes: Secondary | ICD-10-CM | POA: Diagnosis not present

## 2022-07-22 DIAGNOSIS — N281 Cyst of kidney, acquired: Secondary | ICD-10-CM | POA: Diagnosis not present

## 2022-07-22 DIAGNOSIS — I10 Essential (primary) hypertension: Secondary | ICD-10-CM | POA: Diagnosis not present

## 2022-07-22 DIAGNOSIS — N179 Acute kidney failure, unspecified: Secondary | ICD-10-CM | POA: Diagnosis not present

## 2022-07-22 DIAGNOSIS — E7800 Pure hypercholesterolemia, unspecified: Secondary | ICD-10-CM | POA: Diagnosis not present

## 2022-07-22 DIAGNOSIS — I2510 Atherosclerotic heart disease of native coronary artery without angina pectoris: Secondary | ICD-10-CM | POA: Diagnosis not present

## 2022-07-22 DIAGNOSIS — I5032 Chronic diastolic (congestive) heart failure: Secondary | ICD-10-CM | POA: Diagnosis present

## 2022-07-22 DIAGNOSIS — R5383 Other fatigue: Secondary | ICD-10-CM | POA: Diagnosis not present

## 2022-07-22 DIAGNOSIS — C61 Malignant neoplasm of prostate: Secondary | ICD-10-CM | POA: Diagnosis present

## 2022-07-22 DIAGNOSIS — Z923 Personal history of irradiation: Secondary | ICD-10-CM

## 2022-07-22 DIAGNOSIS — G4733 Obstructive sleep apnea (adult) (pediatric): Secondary | ICD-10-CM | POA: Diagnosis present

## 2022-07-22 DIAGNOSIS — R10817 Generalized abdominal tenderness: Secondary | ICD-10-CM | POA: Diagnosis not present

## 2022-07-22 DIAGNOSIS — R109 Unspecified abdominal pain: Secondary | ICD-10-CM | POA: Diagnosis present

## 2022-07-22 DIAGNOSIS — Z888 Allergy status to other drugs, medicaments and biological substances status: Secondary | ICD-10-CM

## 2022-07-22 DIAGNOSIS — Z87891 Personal history of nicotine dependence: Secondary | ICD-10-CM

## 2022-07-22 DIAGNOSIS — F03B18 Unspecified dementia, moderate, with other behavioral disturbance: Secondary | ICD-10-CM | POA: Diagnosis not present

## 2022-07-22 DIAGNOSIS — Z79899 Other long term (current) drug therapy: Secondary | ICD-10-CM

## 2022-07-22 DIAGNOSIS — Z7982 Long term (current) use of aspirin: Secondary | ICD-10-CM

## 2022-07-22 DIAGNOSIS — M109 Gout, unspecified: Secondary | ICD-10-CM | POA: Diagnosis present

## 2022-07-22 DIAGNOSIS — E1122 Type 2 diabetes mellitus with diabetic chronic kidney disease: Secondary | ICD-10-CM | POA: Diagnosis present

## 2022-07-22 DIAGNOSIS — D631 Anemia in chronic kidney disease: Secondary | ICD-10-CM | POA: Diagnosis not present

## 2022-07-22 DIAGNOSIS — Z8546 Personal history of malignant neoplasm of prostate: Secondary | ICD-10-CM | POA: Diagnosis not present

## 2022-07-22 DIAGNOSIS — F01B0 Vascular dementia, moderate, without behavioral disturbance, psychotic disturbance, mood disturbance, and anxiety: Secondary | ICD-10-CM | POA: Diagnosis present

## 2022-07-22 DIAGNOSIS — Z7984 Long term (current) use of oral hypoglycemic drugs: Secondary | ICD-10-CM

## 2022-07-22 DIAGNOSIS — D869 Sarcoidosis, unspecified: Secondary | ICD-10-CM | POA: Diagnosis present

## 2022-07-22 DIAGNOSIS — E785 Hyperlipidemia, unspecified: Secondary | ICD-10-CM | POA: Diagnosis present

## 2022-07-22 DIAGNOSIS — F03B0 Unspecified dementia, moderate, without behavioral disturbance, psychotic disturbance, mood disturbance, and anxiety: Secondary | ICD-10-CM | POA: Diagnosis present

## 2022-07-22 DIAGNOSIS — Z8551 Personal history of malignant neoplasm of bladder: Secondary | ICD-10-CM | POA: Diagnosis not present

## 2022-07-22 DIAGNOSIS — Z8249 Family history of ischemic heart disease and other diseases of the circulatory system: Secondary | ICD-10-CM

## 2022-07-22 DIAGNOSIS — E782 Mixed hyperlipidemia: Secondary | ICD-10-CM | POA: Diagnosis not present

## 2022-07-22 DIAGNOSIS — J9690 Respiratory failure, unspecified, unspecified whether with hypoxia or hypercapnia: Secondary | ICD-10-CM | POA: Diagnosis not present

## 2022-07-22 DIAGNOSIS — R079 Chest pain, unspecified: Secondary | ICD-10-CM | POA: Diagnosis not present

## 2022-07-22 DIAGNOSIS — Z8739 Personal history of other diseases of the musculoskeletal system and connective tissue: Secondary | ICD-10-CM | POA: Diagnosis not present

## 2022-07-22 DIAGNOSIS — F330 Major depressive disorder, recurrent, mild: Secondary | ICD-10-CM | POA: Diagnosis not present

## 2022-07-22 DIAGNOSIS — H9190 Unspecified hearing loss, unspecified ear: Secondary | ICD-10-CM | POA: Diagnosis present

## 2022-07-22 DIAGNOSIS — I509 Heart failure, unspecified: Secondary | ICD-10-CM | POA: Diagnosis not present

## 2022-07-22 DIAGNOSIS — R600 Localized edema: Secondary | ICD-10-CM | POA: Diagnosis not present

## 2022-07-22 DIAGNOSIS — J438 Other emphysema: Secondary | ICD-10-CM | POA: Diagnosis not present

## 2022-07-22 DIAGNOSIS — K559 Vascular disorder of intestine, unspecified: Secondary | ICD-10-CM | POA: Diagnosis not present

## 2022-07-22 DIAGNOSIS — J9811 Atelectasis: Secondary | ICD-10-CM | POA: Diagnosis not present

## 2022-07-22 DIAGNOSIS — N182 Chronic kidney disease, stage 2 (mild): Secondary | ICD-10-CM | POA: Diagnosis not present

## 2022-07-22 DIAGNOSIS — D509 Iron deficiency anemia, unspecified: Secondary | ICD-10-CM | POA: Diagnosis not present

## 2022-07-22 DIAGNOSIS — R0602 Shortness of breath: Secondary | ICD-10-CM | POA: Diagnosis not present

## 2022-07-22 DIAGNOSIS — F0153 Vascular dementia, unspecified severity, with mood disturbance: Secondary | ICD-10-CM | POA: Diagnosis not present

## 2022-07-22 DIAGNOSIS — J9612 Chronic respiratory failure with hypercapnia: Secondary | ICD-10-CM | POA: Diagnosis present

## 2022-07-22 DIAGNOSIS — I110 Hypertensive heart disease with heart failure: Secondary | ICD-10-CM | POA: Diagnosis not present

## 2022-07-22 DIAGNOSIS — I129 Hypertensive chronic kidney disease with stage 1 through stage 4 chronic kidney disease, or unspecified chronic kidney disease: Secondary | ICD-10-CM | POA: Diagnosis not present

## 2022-07-22 DIAGNOSIS — K551 Chronic vascular disorders of intestine: Secondary | ICD-10-CM | POA: Diagnosis not present

## 2022-07-22 DIAGNOSIS — Z833 Family history of diabetes mellitus: Secondary | ICD-10-CM

## 2022-07-22 DIAGNOSIS — K5730 Diverticulosis of large intestine without perforation or abscess without bleeding: Secondary | ICD-10-CM | POA: Diagnosis not present

## 2022-07-22 LAB — CBC WITH DIFFERENTIAL/PLATELET
Abs Immature Granulocytes: 0.03 10*3/uL (ref 0.00–0.07)
Basophils Absolute: 0 10*3/uL (ref 0.0–0.1)
Basophils Relative: 0 %
Eosinophils Absolute: 0.2 10*3/uL (ref 0.0–0.5)
Eosinophils Relative: 3 %
HCT: 36.5 % — ABNORMAL LOW (ref 39.0–52.0)
Hemoglobin: 12 g/dL — ABNORMAL LOW (ref 13.0–17.0)
Immature Granulocytes: 0 %
Lymphocytes Relative: 30 %
Lymphs Abs: 2.1 10*3/uL (ref 0.7–4.0)
MCH: 24.8 pg — ABNORMAL LOW (ref 26.0–34.0)
MCHC: 32.9 g/dL (ref 30.0–36.0)
MCV: 75.6 fL — ABNORMAL LOW (ref 80.0–100.0)
Monocytes Absolute: 1.1 10*3/uL — ABNORMAL HIGH (ref 0.1–1.0)
Monocytes Relative: 16 %
Neutro Abs: 3.6 10*3/uL (ref 1.7–7.7)
Neutrophils Relative %: 51 %
Platelets: 201 10*3/uL (ref 150–400)
RBC: 4.83 MIL/uL (ref 4.22–5.81)
RDW: 18 % — ABNORMAL HIGH (ref 11.5–15.5)
WBC: 7 10*3/uL (ref 4.0–10.5)
nRBC: 0 % (ref 0.0–0.2)

## 2022-07-22 LAB — BRAIN NATRIURETIC PEPTIDE: B Natriuretic Peptide: 125.9 pg/mL — ABNORMAL HIGH (ref 0.0–100.0)

## 2022-07-22 LAB — COMPREHENSIVE METABOLIC PANEL
ALT: 12 U/L (ref 0–44)
AST: 23 U/L (ref 15–41)
Albumin: 3 g/dL — ABNORMAL LOW (ref 3.5–5.0)
Alkaline Phosphatase: 59 U/L (ref 38–126)
Anion gap: 13 (ref 5–15)
BUN: 28 mg/dL — ABNORMAL HIGH (ref 8–23)
CO2: 22 mmol/L (ref 22–32)
Calcium: 8.8 mg/dL — ABNORMAL LOW (ref 8.9–10.3)
Chloride: 105 mmol/L (ref 98–111)
Creatinine, Ser: 1.66 mg/dL — ABNORMAL HIGH (ref 0.61–1.24)
GFR, Estimated: 42 mL/min — ABNORMAL LOW (ref >60)
Glucose, Bld: 127 mg/dL — ABNORMAL HIGH (ref 70–99)
Potassium: 3.9 mmol/L (ref 3.5–5.1)
Sodium: 140 mmol/L (ref 135–145)
Total Bilirubin: 0.5 mg/dL (ref 0.3–1.2)
Total Protein: 7.3 g/dL (ref 6.5–8.1)

## 2022-07-22 LAB — URINALYSIS, ROUTINE W REFLEX MICROSCOPIC
Bilirubin Urine: NEGATIVE
Glucose, UA: NEGATIVE mg/dL
Hgb urine dipstick: NEGATIVE
Ketones, ur: NEGATIVE mg/dL
Leukocytes,Ua: NEGATIVE
Nitrite: NEGATIVE
Protein, ur: NEGATIVE mg/dL
Specific Gravity, Urine: 1.015 (ref 1.005–1.030)
pH: 5 (ref 5.0–8.0)

## 2022-07-22 LAB — TROPONIN I (HIGH SENSITIVITY)
Troponin I (High Sensitivity): 11 ng/L (ref <18)
Troponin I (High Sensitivity): 11 ng/L (ref <18)

## 2022-07-22 LAB — CBG MONITORING, ED: Glucose-Capillary: 115 mg/dL — ABNORMAL HIGH (ref 70–99)

## 2022-07-22 MED ORDER — FUROSEMIDE 10 MG/ML IJ SOLN
20.0000 mg | Freq: Once | INTRAMUSCULAR | Status: AC
  Administered 2022-07-22: 20 mg via INTRAVENOUS
  Filled 2022-07-22: qty 2

## 2022-07-22 NOTE — ED Notes (Signed)
[facility] made aware of transport  

## 2022-07-22 NOTE — ED Provider Notes (Signed)
[facility] Provider Note   CSN: [id number] Arrival date & time: 07/22/22  2010     History  Chief Complaint  Patient presents with   Abdominal Pain    [name] is a 82 y.o. male, type 2 diabetes, hypertension, who presents to the ED secondary to diffuse abdominal pain, bilateral lower extremity swelling that is been going on for the last 2 days.  He also states that he had multiple episodes of diarrhea last week, which is since resolved.  He states he went to see urgent care, and his oxygen was 88% so they sent him here.  He denies any chest pain or shortness of breath at this time.  No history of heart failure per patient is not on any diuretics.  States he has gotten this bloated before and is just gone away.    Home Medications Prior to Admission medications   Medication Sig Start Date End Date Taking? Authorizing Provider  ACCU-CHEK AVIVA PLUS test strip USE AS INSTRUCTED TO CHECK BLOOD SUGAR THREE TIMES DAILY 11/11/16   [name], MD  albuterol (VENTOLIN HFA) 108 (90 Base) MCG/ACT inhaler Inhale 1-2 puffs into the lungs every 4 (four) hours as needed for wheezing or shortness of breath. 05/22/20   [name], MD  amLODipine (NORVASC) 10 MG tablet Take 1 tablet (10 mg total) by mouth daily. 10/30/17   [name], MD  aspirin 81 MG tablet Take 81 mg by mouth at bedtime.     [provider]  atorvastatin (LIPITOR) 20 MG tablet Take 20 mg by mouth daily. 12/16/18   [provider]  benzonatate (TESSALON) 100 MG capsule Take 1 capsule by mouth every 8 (eight) hours for cough. 10/01/21   Vanessa Kick, MD  Blood Glucose Monitoring Suppl (ACCU-CHEK AVIVA PLUS) w/Device KIT Use to check blood sugar 3 times per day dx code E11.65 03/12/16   [name], MD  carbidopa-levodopa (SINEMET IR) 25-100 MG tablet Take 1 pill in the morning, 1 pill in the afternoon, and 2 pills at night 03/20/22   Genia Harold, MD   donepezil (ARICEPT) 10 MG tablet Take 1 tablet (10 mg total) by mouth at bedtime. 03/20/22   Genia Harold, MD  dorzolamide-timolol (COSOPT) 22.3-6.8 MG/ML ophthalmic solution Place 1 drop into both eyes 2 (two) times daily.     [provider]  escitalopram (LEXAPRO) 10 MG tablet Take 1 tablet (10 mg total) by mouth daily. 03/20/22   Genia Harold, MD  ferrous sulfate 325 (65 FE) MG tablet Take 1 tablet (325 mg total) by mouth 2 (two) times daily. 10/30/17   [name], MD  fish oil-omega-3 fatty acids 1000 MG capsule Take 1 g by mouth daily.     [provider]  GEMTESA 75 MG TABS Take 1 tablet by mouth daily. 09/03/21   [provider]  hydroxypropyl methylcellulose / hypromellose (ISOPTO TEARS / GONIOVISC) 2.5 % ophthalmic solution Place 1 drop into both eyes 4 (four) times daily as needed for dry eyes.     [provider]  indomethacin (INDOCIN) 50 MG capsule Take 1 capsule (50 mg total) by mouth 2 (two) times daily as needed (gout). Take with food 12/24/19   [name], NP  insulin regular (NOVOLIN R RELION) 100 units/mL injection Inject 0.05-0.15 mLs (5-15 Units total) into the skin 3 (three) times daily with meals as needed for high blood sugar (per sliding scale). 10/30/17   [name], MD  Insulin Syringe-Needle U-100 (INSULIN SYRINGE .5CC/31GX5/16") 31G X 5/16" 0.5 ML MISC Use one to inject insulin daily 05/02/15   [name], MD  latanoprost (XALATAN) 0.005 % ophthalmic solution Place 1 drop into both eyes at bedtime.  02/17/14   [provider]  meclizine (ANTIVERT) 25 MG tablet Take 1 tablet (25 mg total) by mouth 3 (three) times daily as needed for dizziness. [id number]   [name], MD  memantine (NAMENDA) 10 MG tablet Take 1 tablet (10 mg total) by mouth 2 (two) times daily. 03/20/22   Genia Harold, MD  metFORMIN (GLUCOPHAGE) 1000 MG tablet Take 1 tablet (1,000 mg total) by mouth 2 (two) times daily with a meal. Patient  taking differently: Take 1,000 mg by mouth daily. 10/30/17   [name], MD  metoprolol tartrate (LOPRESSOR) 25 MG tablet Take 1 tablet (25 mg total) by mouth 2 (two) times daily. 10/30/17   [name], MD  tamsulosin (FLOMAX) 0.4 MG CAPS capsule Take 1 capsule (0.4 mg total) by mouth at bedtime. 10/30/17   [name], MD      Allergies    Ace inhibitors and Allopurinol    Review of Systems   Review of Systems  Gastrointestinal:  Positive for abdominal pain.    Physical Exam Updated Vital Signs BP 111/76 (BP Location: Right Arm)   Pulse 66   Temp 98.5 F (36.9 C) (Oral)   Resp 15   Ht 6' (1.829 m)   Wt 86.2 kg   SpO2 95%   BMI 25.77 kg/m  Physical Exam Vitals and nursing note reviewed.  Constitutional:      General: He is not in acute distress.    Appearance: He is well-developed.  HENT:     Head: Normocephalic and atraumatic.  Eyes:     Conjunctiva/sclera: Conjunctivae normal.  Cardiovascular:     Rate and Rhythm: Normal rate and regular rhythm.     Heart sounds: No murmur heard. Pulmonary:     Effort: Pulmonary effort is normal. No respiratory distress.     Breath sounds: No wheezing.     Comments: +crackles throughout Abdominal:     General: Abdomen is protuberant.     Palpations: Abdomen is soft.     Tenderness: There is generalized abdominal tenderness.  Musculoskeletal:        General: No swelling.     Cervical back: Neck supple.  Skin:    General: Skin is warm and dry.     Capillary Refill: Capillary refill takes less than 2 seconds.  Neurological:     Mental Status: He is alert.  Psychiatric:        Mood and Affect: Mood normal.     ED Results / Procedures / Treatments   Labs (all labs ordered are listed, but only abnormal results are displayed) Labs Reviewed  CBC WITH DIFFERENTIAL/PLATELET - Abnormal; Notable for the following components:      Result Value   Hemoglobin 12.0 (*)    HCT 36.5 (*)    MCV 75.6 (*)    MCH 24.8 (*)     RDW 18.0 (*)    Monocytes Absolute 1.1 (*)    All other components within normal limits  COMPREHENSIVE METABOLIC PANEL - Abnormal; Notable for the following components:   Glucose, Bld 127 (*)    BUN 28 (*)    Creatinine, Ser 1.66 (*)    Calcium 8.8 (*)    Albumin 3.0 (*)    GFR, Estimated 42 (*)  All other components within normal limits  BRAIN NATRIURETIC PEPTIDE - Abnormal; Notable for the following components:   B Natriuretic Peptide 125.9 (*)    All other components within normal limits  URINALYSIS, ROUTINE W REFLEX MICROSCOPIC  TROPONIN I (HIGH SENSITIVITY)  TROPONIN I (HIGH SENSITIVITY)    EKG None  Radiology DG Chest 2 View  Result Date: 07/22/2022 CLINICAL DATA:  Shortness of breath. Bilateral lower extremity swelling. EXAM: CHEST - 2 VIEW COMPARISON:  Chest radiograph dated 12/10/2021. FINDINGS: Shallow inspiration with bibasilar atelectasis. There is mild vascular congestion. No focal consolidation, pleural effusion, or pneumothorax. Stable cardiac silhouette. Atherosclerotic calcification of the aorta. No acute osseous pathology. IMPRESSION: Mild vascular congestion. No focal consolidation. Electronically Signed   By: Anner Crete M.D.   On: 07/22/2022 21:13    Procedures Procedures    Medications Ordered in ED Medications  furosemide (LASIX) injection 20 mg (20 mg Intravenous Given 07/22/22 2133)    ED Course/ Medical Decision Making/ A&P                             Medical Decision Making Patient is 82 year old male, here for shortness of breath, 88% on room air at urgent care.  Never been on oxygen.  No history of heart failure.  We will obtain labs, chest x-ray, CT abdomen pelvis given swelling of abdomen, and pain.  I am suspicious for possible CHF.  Amount and/or Complexity of Data Reviewed Labs: ordered.    Details: Labs show worsening creatinine Radiology: ordered.    Details: Chest x-ray shows diffuse vascular congestion Discussion of  management or test interpretation with external provider(s): Discussed with oncoming PA Wylder, concern for new onset CHF.  Pending CT abdomen pelvis.  Recommended admission to hospitalist secondary to new onset CHF, need for further diuresis, and monitoring of worsening kidney function, increased risk for cardiorenal syndrome.  Risk Prescription drug management.    Final Clinical Impression(s) / ED Diagnoses Final diagnoses:  None    Rx / DC Orders ED Discharge Orders     None         Annalis Kaczmarczyk, Si Gaul, PA 07/22/22 2216    Davonna Belling, MD 07/23/22 1442

## 2022-07-22 NOTE — ED Provider Notes (Signed)
[name]    CSN: [id number] Arrival date & time: 07/22/22  1819      History   Chief Complaint Chief Complaint  Patient presents with   Leg Swelling   Abdominal Pain    HPI [name] is a 82 y.o. male.   Patient presents today accompanied by his wife who provides majority of history.  Reports a several day history of abdominal pain, leg swelling, fatigue, malaise.  Reports that he has had some nausea, vomiting, diarrhea but this is since resolved and he had a normal bowel movement today.  He does report that his abdominal pain is severe and rated 10 on a 0-10 pain scale.  He denies diagnosis of CHF and had an echocardiogram in 2015 that showed normal EF of 60 to 65%.  He does have a history of dyspnea on exertion and is followed by pulmonology but has had essentially normal workup and is currently being monitored.  On intake he was noted to be hypoxic with oxygen saturation of 88%.  He does use his CPAP at night but denies any history of supplemental oxygen use.  Denies any significant shortness of breath or chest pain.    Past Medical History:  Diagnosis Date   Bladder cancer    in situ   Cataracts, bilateral    Diabetes mellitus    insulin dependent   Gout    Hearing loss    Heart disease, unspecified    Hypercholesterolemia    Hypertension    Memory changes    Prostate cancer    s/p radiation   Sarcoidosis    Unspecified glaucoma(365.9)     Patient Active Problem List   Diagnosis Date Noted   Chronic coughing 10/11/2021   Severe hypoxemia 10/11/2021   Severe obstructive sleep apnea-hypopnea syndrome 10/11/2021   Moderate dementia 06/26/2021   Hypersomnia, organic 06/26/2021   Excessive daytime sleepiness 06/26/2021   Snoring 06/26/2021   Fever in adult 10/26/2017   Bladder cancer 10/26/2017   Mediastinal lymphadenopathy 10/26/2017   Polyarthralgia 10/26/2017   Effusion of left knee joint 10/26/2017   Angioedema 02/01/2014   Essential  hypertension 02/01/2014   Type 2 diabetes mellitus with diabetic nephropathy 02/01/2014   Hyperlipemia 02/01/2014   Pure hypercholesterolemia 12/08/2012   Type 2 diabetes mellitus with other specified complication 99991111   Primary hypertension 12/06/2012   Prostate cancer ([place]) 04/30/2011    Past Surgical History:  Procedure Laterality Date   BLADDER SURGERY     for carcinoma   CATARACT EXTRACTION Bilateral 2011   knee replacement surgery  1999   right   PROSTATECTOMY  2013       Home Medications    Prior to Admission medications   Medication Sig Start Date End Date Taking? Authorizing Provider  ACCU-CHEK AVIVA PLUS test strip USE AS INSTRUCTED TO CHECK BLOOD SUGAR THREE TIMES DAILY 11/11/16   [name], MD  albuterol (VENTOLIN HFA) 108 (90 Base) MCG/ACT inhaler Inhale 1-2 puffs into the lungs every 4 (four) hours as needed for wheezing or shortness of breath. 05/22/20   [name], MD  amLODipine (NORVASC) 10 MG tablet Take 1 tablet (10 mg total) by mouth daily. 10/30/17   [name], MD  aspirin 81 MG tablet Take 81 mg by mouth at bedtime.     [provider]  atorvastatin (LIPITOR) 20 MG tablet Take 20 mg by mouth daily. 12/16/18   [provider]  benzonatate (TESSALON) 100 MG capsule Take 1 capsule  by mouth every 8 (eight) hours for cough. 10/01/21   Vanessa Kick, MD  Blood Glucose Monitoring Suppl (ACCU-CHEK AVIVA PLUS) w/Device KIT Use to check blood sugar 3 times per day dx code E11.65 03/12/16   [name], MD  carbidopa-levodopa (SINEMET IR) 25-100 MG tablet Take 1 pill in the morning, 1 pill in the afternoon, and 2 pills at night 03/20/22   Genia Harold, MD  donepezil (ARICEPT) 10 MG tablet Take 1 tablet (10 mg total) by mouth at bedtime. 03/20/22   Genia Harold, MD  dorzolamide-timolol (COSOPT) 22.3-6.8 MG/ML ophthalmic solution Place 1 drop into both eyes 2 (two) times daily.     [provider]  escitalopram (LEXAPRO)  10 MG tablet Take 1 tablet (10 mg total) by mouth daily. 03/20/22   Genia Harold, MD  ferrous sulfate 325 (65 FE) MG tablet Take 1 tablet (325 mg total) by mouth 2 (two) times daily. 10/30/17   [name], MD  fish oil-omega-3 fatty acids 1000 MG capsule Take 1 g by mouth daily.     [provider]  GEMTESA 75 MG TABS Take 1 tablet by mouth daily. 09/03/21   [provider]  hydroxypropyl methylcellulose / hypromellose (ISOPTO TEARS / GONIOVISC) 2.5 % ophthalmic solution Place 1 drop into both eyes 4 (four) times daily as needed for dry eyes.     [provider]  indomethacin (INDOCIN) 50 MG capsule Take 1 capsule (50 mg total) by mouth 2 (two) times daily as needed (gout). Take with food 12/24/19   [name], NP  insulin regular (NOVOLIN R RELION) 100 units/mL injection Inject 0.05-0.15 mLs (5-15 Units total) into the skin 3 (three) times daily with meals as needed for high blood sugar (per sliding scale). 10/30/17   [name], MD  Insulin Syringe-Needle U-100 (INSULIN SYRINGE .5CC/31GX5/16") 31G X 5/16" 0.5 ML MISC Use one to inject insulin daily 05/02/15   [name], MD  latanoprost (XALATAN) 0.005 % ophthalmic solution Place 1 drop into both eyes at bedtime.  02/17/14   [provider]  meclizine (ANTIVERT) 25 MG tablet Take 1 tablet (25 mg total) by mouth 3 (three) times daily as needed for dizziness. [id number]   [name], MD  memantine (NAMENDA) 10 MG tablet Take 1 tablet (10 mg total) by mouth 2 (two) times daily. 03/20/22   Genia Harold, MD  metFORMIN (GLUCOPHAGE) 1000 MG tablet Take 1 tablet (1,000 mg total) by mouth 2 (two) times daily with a meal. Patient taking differently: Take 1,000 mg by mouth daily. 10/30/17   [name], MD  metoprolol tartrate (LOPRESSOR) 25 MG tablet Take 1 tablet (25 mg total) by mouth 2 (two) times daily. 10/30/17   [name], MD  tamsulosin (FLOMAX) 0.4 MG CAPS capsule Take 1 capsule (0.4 mg  total) by mouth at bedtime. 10/30/17   [name], MD    Family History Family History  Problem Relation Age of Onset   Hypertension Mother    Diabetes Mellitus I Mother    Hypertension Father    Coronary artery disease Father    Hypertension Sister    Hypertension Brother    Coronary artery disease Brother    Diabetes Mellitus I Brother    Diabetes Mellitus I Brother    Cancer Neg Hx     Social History Social History   Tobacco Use   Smoking status: Former    Packs/day: .5    Types: Cigarettes    Quit date: 04/21/2005  Years since quitting: 17.2   Smokeless tobacco: Never  Substance Use Topics   Alcohol use: No   Drug use: No     Allergies   Ace inhibitors and Allopurinol   Review of Systems Review of Systems  Respiratory:  Negative for cough and shortness of breath.   Cardiovascular:  Positive for leg swelling. Negative for chest pain and palpitations.  Gastrointestinal:  Positive for abdominal pain. Negative for constipation, diarrhea, nausea and vomiting.     Physical Exam Triage Vital Signs ED Triage Vitals  Enc Vitals Group     BP 07/22/22 1927 134/66     Pulse Rate 07/22/22 1927 71     Resp 07/22/22 1927 20     Temp 07/22/22 1927 98.5 F (36.9 C)     Temp Source 07/22/22 1926 Oral     SpO2 07/22/22 1927 (!) 88 %     Weight --      Height --      Head Circumference --      Peak Flow --      Pain Score 07/22/22 1925 10     Pain Loc --      Pain Edu? --      Excl. in [place]? --    No data found.  Updated Vital Signs BP 134/66 (BP Location: Left Arm)   Pulse 71   Temp 98.5 F (36.9 C) (Oral)   Resp 20   SpO2 93%   Visual Acuity Right Eye Distance:   Left Eye Distance:   Bilateral Distance:    Right Eye Near:   Left Eye Near:    Bilateral Near:     Physical Exam Vitals reviewed.  Constitutional:      General: He is awake.     Appearance: Normal appearance. He is well-developed. He is not ill-appearing.     Comments: Very  pleasant male appears stated age sitting on exam room table in no acute distress  HENT:     Head: Normocephalic and atraumatic.  Cardiovascular:     Rate and Rhythm: Normal rate and regular rhythm.     Heart sounds: Normal heart sounds, S1 normal and S2 normal. No murmur heard. Pulmonary:     Effort: Pulmonary effort is normal.     Breath sounds: No stridor. Examination of the right-lower field reveals decreased breath sounds. Examination of the left-lower field reveals decreased breath sounds. Decreased breath sounds present. No wheezing, rhonchi or rales.  Abdominal:     General: Bowel sounds are normal.     Palpations: Abdomen is soft.     Tenderness: There is abdominal tenderness. There is guarding. There is no right CVA tenderness, left CVA tenderness or rebound.  Neurological:     Mental Status: He is alert.  Psychiatric:        Behavior: Behavior is cooperative.      UC Treatments / Results  Labs (all labs ordered are listed, but only abnormal results are displayed) Labs Reviewed - No data to display  EKG   Radiology No results found.  Procedures Procedures (including critical care time)  Medications Ordered in UC Medications - No data to display  Initial Impression / Assessment and Plan / UC Course  I have reviewed the triage vital signs and the nursing notes.  Pertinent labs & imaging results that were available during my care of the patient were reviewed by me and considered in my medical decision making (see chart for details).     Patient  was initially mildly hypoxic with oxygen saturation of 88% on intake.  This improved to approximately 93 to 94% with 2 L via nasal cannula.  He also had significant abdominal tenderness and guarding.  Discussed that we do not have the ability of evaluating him in urgent care and he would need to go to the emergency room.  Given new oxygen requirement he will be transported by [facility].  He was stable at the time of  discharge.  Final Clinical Impressions(s) / UC Diagnoses   Final diagnoses:  Hypoxia  Abdominal guarding   Discharge Instructions   None    ED Prescriptions   None    PDMP not reviewed this encounter.   Terrilee Croak, PA-C 07/22/22 1941

## 2022-07-22 NOTE — Telephone Encounter (Signed)
Spoke with patients wife. Scheduled patient for F/u with Dr. Lamonte Sakai. NFN

## 2022-07-22 NOTE — Telephone Encounter (Signed)
Okay to change to provider at [facility].

## 2022-07-22 NOTE — ED Triage Notes (Signed)
Pt c/o abd pain and swelling has been going on for a week. Reports n/v/d. Went to doctor and got medication for those symptoms.   Pt reports bilateral leg swelling and pain since yesterday.

## 2022-07-22 NOTE — ED Notes (Signed)
Patient is being discharged from the Urgent Care and sent to the Emergency Department via [facility] . Per Verna Czech, PA, patient is in need of higher level of care due to abdominal distention, tenderness, SOB, hypoxia. Patient is aware and verbalizes understanding of plan of care.  Vitals:   07/22/22 1927 07/22/22 1933  BP: 134/66   Pulse: 71   Resp: 20   Temp: 98.5 F (36.9 C)   SpO2: (!) 88% 93%

## 2022-07-22 NOTE — ED Notes (Signed)
Made Charge RN at [facility] ED aware patient coming via [facility]

## 2022-07-22 NOTE — ED Triage Notes (Signed)
Pt to ED via EMS from urgent care. Pt c/o diffuse abd pain and distention. Pt was also sating 88% RA at [facility] and was placed on 2L [place]. Pt on RA baseline. Pt denies SOB.   EMS Vitals: 140/70 97% 2L 68 HR

## 2022-07-22 NOTE — ED Notes (Signed)
Per [place], [place], verbal order to place patient on 2L oxygen via nasal canula.

## 2022-07-22 NOTE — Telephone Encounter (Signed)
Pt called the office to schedule a follow up visit with Dr. Halford Chessman but after stated to pt that Dr. Halford Chessman was no longer at [facility], he stated that he needed a provider that was still at the [facility] location due to not doing a lot of driving.   Dr. Halford Chessman, please advise if you are okay with pt switching to another provider that is still at [facility] location?

## 2022-07-23 ENCOUNTER — Emergency Department (HOSPITAL_COMMUNITY): Payer: Medicare HMO

## 2022-07-23 DIAGNOSIS — I10 Essential (primary) hypertension: Secondary | ICD-10-CM | POA: Diagnosis not present

## 2022-07-23 DIAGNOSIS — M109 Gout, unspecified: Secondary | ICD-10-CM | POA: Diagnosis present

## 2022-07-23 DIAGNOSIS — I5032 Chronic diastolic (congestive) heart failure: Secondary | ICD-10-CM | POA: Diagnosis present

## 2022-07-23 DIAGNOSIS — R109 Unspecified abdominal pain: Secondary | ICD-10-CM | POA: Diagnosis not present

## 2022-07-23 DIAGNOSIS — G4733 Obstructive sleep apnea (adult) (pediatric): Secondary | ICD-10-CM

## 2022-07-23 DIAGNOSIS — N179 Acute kidney failure, unspecified: Secondary | ICD-10-CM | POA: Diagnosis present

## 2022-07-23 DIAGNOSIS — K551 Chronic vascular disorders of intestine: Secondary | ICD-10-CM

## 2022-07-23 DIAGNOSIS — Z8546 Personal history of malignant neoplasm of prostate: Secondary | ICD-10-CM | POA: Diagnosis not present

## 2022-07-23 DIAGNOSIS — K559 Vascular disorder of intestine, unspecified: Secondary | ICD-10-CM | POA: Diagnosis present

## 2022-07-23 DIAGNOSIS — Z923 Personal history of irradiation: Secondary | ICD-10-CM | POA: Diagnosis not present

## 2022-07-23 DIAGNOSIS — J438 Other emphysema: Secondary | ICD-10-CM | POA: Diagnosis present

## 2022-07-23 DIAGNOSIS — F01B0 Vascular dementia, moderate, without behavioral disturbance, psychotic disturbance, mood disturbance, and anxiety: Secondary | ICD-10-CM | POA: Diagnosis present

## 2022-07-23 DIAGNOSIS — R591 Generalized enlarged lymph nodes: Secondary | ICD-10-CM | POA: Diagnosis not present

## 2022-07-23 DIAGNOSIS — Z794 Long term (current) use of insulin: Secondary | ICD-10-CM

## 2022-07-23 DIAGNOSIS — E1122 Type 2 diabetes mellitus with diabetic chronic kidney disease: Secondary | ICD-10-CM | POA: Diagnosis present

## 2022-07-23 DIAGNOSIS — K5730 Diverticulosis of large intestine without perforation or abscess without bleeding: Secondary | ICD-10-CM | POA: Diagnosis not present

## 2022-07-23 DIAGNOSIS — E1121 Type 2 diabetes mellitus with diabetic nephropathy: Secondary | ICD-10-CM

## 2022-07-23 DIAGNOSIS — N281 Cyst of kidney, acquired: Secondary | ICD-10-CM | POA: Diagnosis not present

## 2022-07-23 DIAGNOSIS — J9601 Acute respiratory failure with hypoxia: Secondary | ICD-10-CM

## 2022-07-23 DIAGNOSIS — H409 Unspecified glaucoma: Secondary | ICD-10-CM | POA: Diagnosis present

## 2022-07-23 DIAGNOSIS — Z1152 Encounter for screening for COVID-19: Secondary | ICD-10-CM | POA: Diagnosis not present

## 2022-07-23 DIAGNOSIS — J9690 Respiratory failure, unspecified, unspecified whether with hypoxia or hypercapnia: Secondary | ICD-10-CM | POA: Diagnosis not present

## 2022-07-23 DIAGNOSIS — Z8739 Personal history of other diseases of the musculoskeletal system and connective tissue: Secondary | ICD-10-CM

## 2022-07-23 DIAGNOSIS — I110 Hypertensive heart disease with heart failure: Secondary | ICD-10-CM | POA: Diagnosis present

## 2022-07-23 DIAGNOSIS — C61 Malignant neoplasm of prostate: Secondary | ICD-10-CM

## 2022-07-23 DIAGNOSIS — R600 Localized edema: Secondary | ICD-10-CM | POA: Diagnosis present

## 2022-07-23 DIAGNOSIS — J9612 Chronic respiratory failure with hypercapnia: Secondary | ICD-10-CM | POA: Diagnosis present

## 2022-07-23 DIAGNOSIS — D869 Sarcoidosis, unspecified: Secondary | ICD-10-CM | POA: Diagnosis present

## 2022-07-23 DIAGNOSIS — D509 Iron deficiency anemia, unspecified: Secondary | ICD-10-CM | POA: Diagnosis present

## 2022-07-23 DIAGNOSIS — I2510 Atherosclerotic heart disease of native coronary artery without angina pectoris: Secondary | ICD-10-CM | POA: Diagnosis not present

## 2022-07-23 DIAGNOSIS — J9811 Atelectasis: Secondary | ICD-10-CM | POA: Diagnosis not present

## 2022-07-23 DIAGNOSIS — E7800 Pure hypercholesterolemia, unspecified: Secondary | ICD-10-CM | POA: Diagnosis present

## 2022-07-23 DIAGNOSIS — Z96651 Presence of right artificial knee joint: Secondary | ICD-10-CM | POA: Diagnosis present

## 2022-07-23 DIAGNOSIS — H9190 Unspecified hearing loss, unspecified ear: Secondary | ICD-10-CM | POA: Diagnosis present

## 2022-07-23 DIAGNOSIS — Z8551 Personal history of malignant neoplasm of bladder: Secondary | ICD-10-CM | POA: Diagnosis not present

## 2022-07-23 DIAGNOSIS — I509 Heart failure, unspecified: Secondary | ICD-10-CM | POA: Diagnosis not present

## 2022-07-23 DIAGNOSIS — F03B18 Unspecified dementia, moderate, with other behavioral disturbance: Secondary | ICD-10-CM

## 2022-07-23 DIAGNOSIS — E782 Mixed hyperlipidemia: Secondary | ICD-10-CM | POA: Diagnosis not present

## 2022-07-23 DIAGNOSIS — Z888 Allergy status to other drugs, medicaments and biological substances status: Secondary | ICD-10-CM | POA: Diagnosis not present

## 2022-07-23 DIAGNOSIS — R079 Chest pain, unspecified: Secondary | ICD-10-CM | POA: Diagnosis not present

## 2022-07-23 DIAGNOSIS — Z87891 Personal history of nicotine dependence: Secondary | ICD-10-CM | POA: Diagnosis not present

## 2022-07-23 LAB — BASIC METABOLIC PANEL
Anion gap: 11 (ref 5–15)
BUN: 16 mg/dL (ref 8–23)
CO2: 28 mmol/L (ref 22–32)
Calcium: 9 mg/dL (ref 8.9–10.3)
Chloride: 103 mmol/L (ref 98–111)
Creatinine, Ser: 1.32 mg/dL — ABNORMAL HIGH (ref 0.61–1.24)
GFR, Estimated: 55 mL/min — ABNORMAL LOW (ref >60)
Glucose, Bld: 115 mg/dL — ABNORMAL HIGH (ref 70–99)
Potassium: 3.2 mmol/L — ABNORMAL LOW (ref 3.5–5.1)
Sodium: 142 mmol/L (ref 135–145)

## 2022-07-23 LAB — GLUCOSE, CAPILLARY
Glucose-Capillary: 184 mg/dL — ABNORMAL HIGH (ref 70–99)
Glucose-Capillary: 198 mg/dL — ABNORMAL HIGH (ref 70–99)
Glucose-Capillary: 111 mg/dL — ABNORMAL HIGH (ref 70–99)

## 2022-07-23 LAB — LACTIC ACID, PLASMA
Lactic Acid, Venous: 0.8 mmol/L (ref 0.5–1.9)
Lactic Acid, Venous: 0.8 mmol/L (ref 0.5–1.9)

## 2022-07-23 LAB — HEMOGLOBIN A1C
Hgb A1c MFr Bld: 7.8 % — ABNORMAL HIGH (ref 4.8–5.6)
Mean Plasma Glucose: 177 mg/dL

## 2022-07-23 LAB — LIPID PANEL
Cholesterol: 96 mg/dL (ref 0–200)
HDL: 30 mg/dL — ABNORMAL LOW (ref >40)
LDL Cholesterol: 54 mg/dL (ref 0–99)
Total CHOL/HDL Ratio: 3.2 RATIO
Triglycerides: 59 mg/dL (ref <150)
VLDL: 12 mg/dL (ref 0–40)

## 2022-07-23 LAB — CBG MONITORING, ED: Glucose-Capillary: 119 mg/dL — ABNORMAL HIGH (ref 70–99)

## 2022-07-23 LAB — IRON AND TIBC
Iron: 33 ug/dL — ABNORMAL LOW (ref 45–182)
Saturation Ratios: 9 % — ABNORMAL LOW (ref 17.9–39.5)
TIBC: 372 ug/dL (ref 250–450)
UIBC: 339 ug/dL

## 2022-07-23 LAB — RESP PANEL BY RT-PCR (RSV, FLU A&B, COVID)  RVPGX2
Influenza A by PCR: NEGATIVE
Influenza B by PCR: NEGATIVE
Resp Syncytial Virus by PCR: NEGATIVE
SARS Coronavirus 2 by RT PCR: NEGATIVE

## 2022-07-23 LAB — TSH: TSH: 4.078 u[IU]/mL (ref 0.350–4.500)

## 2022-07-23 LAB — FERRITIN: Ferritin: 42 ng/mL (ref 24–336)

## 2022-07-23 MED ORDER — FERROUS SULFATE 325 (65 FE) MG PO TABS
325.0000 mg | ORAL_TABLET | Freq: Two times a day (BID) | ORAL | Status: DC
  Administered 2022-07-23 – 2022-07-25 (×4): 325 mg via ORAL
  Filled 2022-07-23 (×4): qty 1

## 2022-07-23 MED ORDER — DORZOLAMIDE HCL-TIMOLOL MAL 2-0.5 % OP SOLN
1.0000 [drp] | Freq: Two times a day (BID) | OPHTHALMIC | Status: DC
  Administered 2022-07-23 – 2022-07-25 (×5): 1 [drp] via OPHTHALMIC
  Filled 2022-07-23: qty 10

## 2022-07-23 MED ORDER — METOPROLOL TARTRATE 25 MG PO TABS
25.0000 mg | ORAL_TABLET | Freq: Two times a day (BID) | ORAL | Status: DC
  Administered 2022-07-23 – 2022-07-25 (×5): 25 mg via ORAL
  Filled 2022-07-23 (×5): qty 1

## 2022-07-23 MED ORDER — ALBUTEROL SULFATE (2.5 MG/3ML) 0.083% IN NEBU: 2.5000 mg | INHALATION_SOLUTION | Freq: Four times a day (QID) | RESPIRATORY_TRACT | Status: DC | PRN

## 2022-07-23 MED ORDER — ENOXAPARIN SODIUM 40 MG/0.4ML IJ SOSY
40.0000 mg | PREFILLED_SYRINGE | INTRAMUSCULAR | Status: DC
  Administered 2022-07-23 – 2022-07-25 (×3): 40 mg via SUBCUTANEOUS
  Filled 2022-07-23 (×4): qty 0.4

## 2022-07-23 MED ORDER — ACETAMINOPHEN 325 MG PO TABS
650.0000 mg | ORAL_TABLET | Freq: Four times a day (QID) | ORAL | Status: DC | PRN
  Administered 2022-07-25: 650 mg via ORAL
  Filled 2022-07-23: qty 2

## 2022-07-23 MED ORDER — MEMANTINE HCL 10 MG PO TABS
10.0000 mg | ORAL_TABLET | Freq: Two times a day (BID) | ORAL | Status: DC
  Administered 2022-07-23 – 2022-07-25 (×5): 10 mg via ORAL
  Filled 2022-07-23 (×5): qty 1

## 2022-07-23 MED ORDER — ACETAMINOPHEN 650 MG RE SUPP: 650.0000 mg | Freq: Four times a day (QID) | RECTAL | Status: DC | PRN

## 2022-07-23 MED ORDER — INSULIN ASPART 100 UNIT/ML IJ SOLN
0.0000 [IU] | Freq: Three times a day (TID) | INTRAMUSCULAR | Status: DC
  Administered 2022-07-23 (×2): 3 [IU] via SUBCUTANEOUS
  Administered 2022-07-25: 2 [IU] via SUBCUTANEOUS
  Administered 2022-07-25: 8 [IU] via SUBCUTANEOUS

## 2022-07-23 MED ORDER — IOHEXOL 350 MG/ML SOLN
75.0000 mL | Freq: Once | INTRAVENOUS | Status: AC | PRN
  Administered 2022-07-23: 75 mL via INTRAVENOUS

## 2022-07-23 MED ORDER — HYPROMELLOSE (GONIOSCOPIC) 2.5 % OP SOLN: 1.0000 [drp] | Freq: Four times a day (QID) | OPHTHALMIC | Status: DC | PRN

## 2022-07-23 MED ORDER — POTASSIUM CHLORIDE CRYS ER 20 MEQ PO TBCR
40.0000 meq | EXTENDED_RELEASE_TABLET | ORAL | Status: AC
  Administered 2022-07-23: 40 meq via ORAL
  Filled 2022-07-23: qty 2

## 2022-07-23 MED ORDER — LATANOPROST 0.005 % OP SOLN
1.0000 [drp] | Freq: Every day | OPHTHALMIC | Status: DC
  Administered 2022-07-23 – 2022-07-24 (×2): 1 [drp] via OPHTHALMIC
  Filled 2022-07-23: qty 2.5

## 2022-07-23 MED ORDER — CARBIDOPA-LEVODOPA 25-100 MG PO TABS
2.0000 | ORAL_TABLET | Freq: Every day | ORAL | Status: DC
  Administered 2022-07-23 – 2022-07-24 (×2): 2 via ORAL
  Filled 2022-07-23 (×2): qty 2

## 2022-07-23 MED ORDER — ASPIRIN 81 MG PO CHEW
81.0000 mg | CHEWABLE_TABLET | Freq: Every day | ORAL | Status: DC
  Administered 2022-07-23 – 2022-07-24 (×2): 81 mg via ORAL
  Filled 2022-07-23 (×2): qty 1

## 2022-07-23 MED ORDER — CARBIDOPA-LEVODOPA 25-100 MG PO TABS: 1.0000 | ORAL_TABLET | ORAL | Status: DC

## 2022-07-23 MED ORDER — TAMSULOSIN HCL 0.4 MG PO CAPS
0.4000 mg | ORAL_CAPSULE | Freq: Every day | ORAL | Status: DC
  Administered 2022-07-23 – 2022-07-24 (×2): 0.4 mg via ORAL
  Filled 2022-07-23 (×2): qty 1

## 2022-07-23 MED ORDER — ATORVASTATIN CALCIUM 10 MG PO TABS
20.0000 mg | ORAL_TABLET | Freq: Every day | ORAL | Status: DC
  Administered 2022-07-23 – 2022-07-25 (×3): 20 mg via ORAL
  Filled 2022-07-23 (×3): qty 2

## 2022-07-23 MED ORDER — AMLODIPINE BESYLATE 10 MG PO TABS
10.0000 mg | ORAL_TABLET | Freq: Every day | ORAL | Status: DC
  Administered 2022-07-23 – 2022-07-25 (×3): 10 mg via ORAL
  Filled 2022-07-23 (×3): qty 1

## 2022-07-23 MED ORDER — DONEPEZIL HCL 10 MG PO TABS
10.0000 mg | ORAL_TABLET | Freq: Every day | ORAL | Status: DC
  Administered 2022-07-23 – 2022-07-24 (×2): 10 mg via ORAL
  Filled 2022-07-23 (×2): qty 1

## 2022-07-23 MED ORDER — CARBIDOPA-LEVODOPA 25-100 MG PO TABS
1.0000 | ORAL_TABLET | Freq: Two times a day (BID) | ORAL | Status: DC
  Administered 2022-07-23 – 2022-07-25 (×4): 1 via ORAL
  Filled 2022-07-23 (×4): qty 1

## 2022-07-23 MED ORDER — SODIUM CHLORIDE 0.9% FLUSH
3.0000 mL | Freq: Two times a day (BID) | INTRAVENOUS | Status: DC
  Administered 2022-07-23 – 2022-07-25 (×5): 3 mL via INTRAVENOUS

## 2022-07-23 MED ORDER — INSULIN ASPART 100 UNIT/ML IJ SOLN
0.0000 [IU] | Freq: Every day | INTRAMUSCULAR | Status: DC
  Administered 2022-07-24: 3 [IU] via SUBCUTANEOUS

## 2022-07-23 MED ORDER — MIRABEGRON ER 25 MG PO TB24
25.0000 mg | ORAL_TABLET | Freq: Every day | ORAL | Status: DC
  Administered 2022-07-23 – 2022-07-25 (×3): 25 mg via ORAL
  Filled 2022-07-23 (×3): qty 1

## 2022-07-23 NOTE — Consult Note (Addendum)
Hospital Consult    Reason for Consult:  suspected mesenteric ischemia Requesting Physician:  [facility] MRN #:  [id number]  History of Present Illness: This is a 82 y.o. male with 6-week history of diarrhea and abdominal bloating.  He is being seen in consultation for suspected mesenteric ischemia.  Workup included CT abdomen demonstrating ostial stenosis of the celiac axis, SMA, and IMA.  Patient states his abdominal pain has improved since being seen in the urgent care.  He believes the diarrhea has resolved.  He denies any blood in his stool.  He also denies any postprandial pain, weight loss, or food fear.  He quit smoking 25 years ago.  He denies any claudication or tissue loss of bilateral lower extremities.  Workup also demonstrates acute kidney injury with creatinine 1.66.  Baseline creatinine around 1.  Past Medical History:  Diagnosis Date   Bladder cancer    in situ   Cataracts, bilateral    Diabetes mellitus    insulin dependent   Gout    Hearing loss    Heart disease, unspecified    Hypercholesterolemia    Hypertension    Memory changes    Prostate cancer    s/p radiation   Sarcoidosis    Unspecified glaucoma(365.9)     Past Surgical History:  Procedure Laterality Date   BLADDER SURGERY     for carcinoma   CATARACT EXTRACTION Bilateral 2011   knee replacement surgery  1999   right   PROSTATECTOMY  2013    Allergies  Allergen Reactions   Ace Inhibitors Swelling    Benazepril   Allopurinol Rash    Prior to Admission medications   Medication Sig Start Date End Date Taking? Authorizing Provider  amLODipine (NORVASC) 10 MG tablet Take 1 tablet (10 mg total) by mouth daily. 10/30/17  Yes [name], [name], MD  aspirin 81 MG tablet Take 81 mg by mouth at bedtime.    Yes [provider]  atorvastatin (LIPITOR) 20 MG tablet Take 20 mg by mouth daily. 12/16/18  Yes [provider]  benzonatate (TESSALON) 100 MG capsule Take 1 capsule by mouth every 8  (eight) hours for cough. Patient taking differently: Take 100 mg by mouth daily as needed for cough. 10/01/21  Yes Vanessa Kick, MD  Blood Glucose Monitoring Suppl (ACCU-CHEK AVIVA PLUS) w/Device KIT Use to check blood sugar 3 times per day dx code E11.65 03/12/16  Yes [name], MD  carbidopa-levodopa (SINEMET IR) 25-100 MG tablet Take 1 pill in the morning, 1 pill in the afternoon, and 2 pills at night Patient taking differently: Take 1-2 tablets by mouth See admin instructions. Take 1 tablet by mouth in the morning, 1 tablet in the afternoon and 2 tablets at night 03/20/22  Yes Chima, Anderson Malta, MD  donepezil (ARICEPT) 10 MG tablet Take 1 tablet (10 mg total) by mouth at bedtime. 03/20/22  Yes Genia Harold, MD  dorzolamide-timolol (COSOPT) 22.3-6.8 MG/ML ophthalmic solution Place 1 drop into both eyes 2 (two) times daily.    Yes [provider]  escitalopram (LEXAPRO) 10 MG tablet Take 1 tablet (10 mg total) by mouth daily. 03/20/22  Yes Genia Harold, MD  ferrous sulfate 325 (65 FE) MG tablet Take 1 tablet (325 mg total) by mouth 2 (two) times daily. 10/30/17  Yes [name], [name], MD  fish oil-omega-3 fatty acids 1000 MG capsule Take 1 g by mouth daily.    Yes [provider]  GEMTESA 75 MG TABS Take 75 mg by  mouth daily. 09/03/21  Yes [provider]  hydroxypropyl methylcellulose / hypromellose (ISOPTO TEARS / GONIOVISC) 2.5 % ophthalmic solution Place 1 drop into both eyes 4 (four) times daily as needed for dry eyes.    Yes [provider]  indomethacin (INDOCIN) 50 MG capsule Take 1 capsule (50 mg total) by mouth 2 (two) times daily as needed (gout). Take with food 12/24/19  Yes Bast, Traci A, NP  insulin regular (NOVOLIN R RELION) 100 units/mL injection Inject 0.05-0.15 mLs (5-15 Units total) into the skin 3 (three) times daily with meals as needed for high blood sugar (per sliding scale). 10/30/17  Yes [name], [name], MD  latanoprost (XALATAN) 0.005 %  ophthalmic solution Place 1 drop into both eyes at bedtime.  02/17/14  Yes [provider]  meclizine (ANTIVERT) 25 MG tablet Take 1 tablet (25 mg total) by mouth 3 (three) times daily as needed for dizziness. [id number]  Yes [name], MD  memantine (NAMENDA) 10 MG tablet Take 1 tablet (10 mg total) by mouth 2 (two) times daily. 03/20/22  Yes Genia Harold, MD  metFORMIN (GLUCOPHAGE) 1000 MG tablet Take 1 tablet (1,000 mg total) by mouth 2 (two) times daily with a meal. 10/30/17  Yes [name], [name], MD  metoprolol tartrate (LOPRESSOR) 25 MG tablet Take 1 tablet (25 mg total) by mouth 2 (two) times daily. 10/30/17  Yes [name], [name], MD  Multiple Vitamins-Minerals (CENTRUM SILVER ADULT 50+) TABS Take 1 tablet by mouth daily.   Yes [provider]  Multiple Vitamins-Minerals (PRESERVISION AREDS) TABS Take 1 capsule by mouth in the morning and at bedtime.   Yes [provider]  ACCU-CHEK AVIVA PLUS test strip USE AS INSTRUCTED TO CHECK BLOOD SUGAR THREE TIMES DAILY 11/11/16   [name], MD  Insulin Syringe-Needle U-100 (INSULIN SYRINGE .5CC/31GX5/16") 31G X 5/16" 0.5 ML MISC Use one to inject insulin daily 05/02/15   [name], MD  tamsulosin (FLOMAX) 0.4 MG CAPS capsule Take 1 capsule (0.4 mg total) by mouth at bedtime. 10/30/17   [name], MD    Social History   Socioeconomic History   Marital status: Married    Spouse name: [name]   Number of children: 3   Years of education: 9   Highest education level: 9th grade  Occupational History    Comment: retired Research scientist (medical)  Tobacco Use   Smoking status: Former    Packs/day: .5    Types: Cigarettes    Quit date: 04/21/2005    Years since quitting: 17.2   Smokeless tobacco: Never  Substance and Sexual Activity   Alcohol use: No   Drug use: No   Sexual activity: Not Currently  Other Topics Concern   Not on file  Social History Narrative   06/05/21 Lives with wife   Right handed   Caffeine: 1 C of  coffee a day   Social Determinants of Health   Financial Resource Strain: Not on file  Food Insecurity: No Food Insecurity (04/26/2021)   Hunger Vital Sign    Worried About Running Out of Food in the Last Year: Never true    [place] in the Last Year: Never true  Transportation Needs: No Transportation Needs (04/26/2021)   PRAPARE - Hydrologist (Medical): No    Lack of Transportation (Non-Medical): No  Physical Activity: Not on file  Stress: Not on file  Social Connections: Not on file  Intimate Partner Violence: Not on file  Family History  Problem Relation Age of Onset   Hypertension Mother    Diabetes Mellitus I Mother    Hypertension Father    Coronary artery disease Father    Hypertension Sister    Hypertension Brother    Coronary artery disease Brother    Diabetes Mellitus I Brother    Diabetes Mellitus I Brother    Cancer Neg Hx     ROS: Otherwise negative unless mentioned in HPI  Physical Examination  Vitals:   07/23/22 0555 07/23/22 0952  BP: (!) 158/75 (!) 141/70  Pulse: 67 74  Resp: 19 18  Temp: 98.5 F (36.9 C) 97.9 F (36.6 C)  SpO2: 98% 96%   Body mass index is 25.77 kg/m.  General:  WDWN in NAD Gait: Not observed HENT: WNL, normocephalic Pulmonary: normal non-labored breathing, without Rales, rhonchi,  wheezing Cardiac: regular Abdomen:  soft, diffuse tenderness Skin: without rashes Vascular Exam/Pulses: palpable PT pulses bilaterally, palpable L DP pulse Extremities: without ischemic changes, without Gangrene , without cellulitis; without open wounds;  Musculoskeletal: no muscle wasting or atrophy  Neurologic: A&O X 3 Psychiatric:  The pt has Normal affect. Lymph:  Unremarkable  CBC    Component Value Date/Time   WBC 7.0 07/22/2022 2050   RBC 4.83 07/22/2022 2050   HGB 12.0 (L) 07/22/2022 2050   HGB 12.2 (L) 09/30/2006 1510   HCT 36.5 (L) 07/22/2022 2050   HCT 37.0 (L) 09/30/2006 1510   PLT  201 07/22/2022 2050   PLT 216 09/30/2006 1510   MCV 75.6 (L) 07/22/2022 2050   MCV 72.6 (L) 09/30/2006 1510   MCH 24.8 (L) 07/22/2022 2050   MCHC 32.9 07/22/2022 2050   RDW 18.0 (H) 07/22/2022 2050   RDW 22.4 (H) 09/30/2006 1510   LYMPHSABS 2.1 07/22/2022 2050   LYMPHSABS 2.4 09/30/2006 1510   MONOABS 1.1 (H) 07/22/2022 2050   MONOABS 0.8 09/30/2006 1510   EOSABS 0.2 07/22/2022 2050   EOSABS 0.1 09/30/2006 1510   BASOSABS 0.0 07/22/2022 2050   BASOSABS 0.0 09/30/2006 1510    BMET    Component Value Date/Time   NA 142 07/23/2022 0941   K 3.2 (L) 07/23/2022 0941   CL 103 07/23/2022 0941   CO2 28 07/23/2022 0941   GLUCOSE 115 (H) 07/23/2022 0941   BUN 16 07/23/2022 0941   CREATININE 1.32 (H) 07/23/2022 0941   CALCIUM 9.0 07/23/2022 0941   GFRNONAA 55 (L) 07/23/2022 0941   GFRAA >60 10/30/2017 0437    COAGS: Lab Results  Component Value Date   INR 1.13 12/29/2010   INR 1.07 11/01/2010   INR 1.1 10/29/2007     Non-Invasive Vascular Imaging:   CT abdomen demonstrating ostial stenosis of the celiac, SMA, and IMA    ASSESSMENT/PLAN: This is a 82 y.o. male with abdominal pain  -Mr. Palms is a 82 year old male who was admitted to the hospital with abdominal pain.  He has a 6-week history of diarrhea however denies any blood in his stool.  Subjectively his abdominal pain has improved and he believes the diarrhea has resolved.  On exam his abdomen is soft but diffusely tender.  He has no history of food fear, postprandial pain, or weight loss.  He is hungry and has a normal appetite.  Ok to have a regular diet.  He is also being treated for AKI.  We will continue to monitor the patient during current admission however no indication for further workup at this time.  If abdominal pain  returns or exam changes we would consider aortogram in the absence of other GI etiologies.  Patient can otherwise be seen in the outpatient setting with a mesenteric duplex to reevaluate mesenteric  stenosis.  On-call vascular surgeon Dr. Stanford Breed will evaluate the patient later today and provide further treatment plans   Dagoberto Ligas PA-C [facility] [phone]  VASCULAR STAFF ADDENDUM: I have independently interviewed and examined the patient. I agree with the above.  Mesenteric atherosclerosis seen on CT scan of abdomen / pelvis during workup for abdominal pain. Patient with some memory issues. Reports episodic abdominal pain associated with diarrhea. (+) sick contacts.  No food fear. No post prandial pain. No unintentional weight loss. Very little evidence of chronic mesenteric ischemia. Can follow up with [place] as an outpatient.  Please call for questions.  Yevonne Aline. Stanford Breed, MD [facility] Phone Number: [phone] 07/23/2022 9:48 PM

## 2022-07-23 NOTE — H&P (Addendum)
History and Physical    Patient: [name] [id number] DOB: January 04, 1941 DOA: 07/22/2022 DOS: the patient was seen and examined on 07/23/2022 PCP: [name], MD  Patient coming from: Transfer from urgent care  Chief Complaint:  Chief Complaint  Patient presents with   Abdominal Pain   HPI: [name] is a 82 y.o. male with medical history significant of hypertension, hyperlipidemia, diastolic CHF, diabetes mellitus type 2, sarcoidosis, gout, and prostate cancer, vascular dementia, and remote tobacco abuse who presents with complaints of abdominal pain and leg pain.  History is somewhat limited from the patient due to issues with memory, but his wife is present at bedside to assist.  Over the last couple of days to week the patient had been complaining feet and legs swelling as well as abdominal bloating.  Patient reported having pain on both sides of his abdomen, but not  clear if this was worsened by eating.  The patient has also been recently dealing with pain in his knees though possibly related to a gout flare.  They both that had episodes of nausea, vomiting, and diarrhea last week related to coming in contact with a family member that had a GI bug.  Those symptoms reportedly only lasted a day and thereafter self resolved.  He had been seen by his primary care provider on 3/26 and had been diagnosed with a urinary tract infection.  He has been given ciprofloxacin 500 mg twice daily to take for 7 days and Imodium.  Denied any complaints of fever, chest pain, shortness of breath.  The patient had been noted to have a cough.  His wife also makes note that he had not been very active and has been sitting for long periods of time not wanting to get up or move around.  He is followed by urology for the history of prostate cancer which wife notes was treated with radiation.  In the emergency department patient was noted to be afebrile with blood pressures elevated up to 158/75, and O2  saturations noted to be as low as 86% on room air with improvement on 2 L nasal cannula oxygen to greater than 92%.  Labs from 4/2 noted hemoglobin 12, BUN 28, creatinine 1.66, BNP 125.9, and high-sensitivity troponins negative x 2.  Chest x-ray noted mild vascular congestion with no focal consolidation.  Urinalysis showed no signs of infection.  Influenza, COVID-19, and RSV screening were negative.  CT angiogram of the chest, abdomen, and pelvis have been obtained.  No pulmonary embolus was noted, extensive multivessel coronary artery calcifications found with mild paraseptal emphysema, increased interval progression of mediastinal and hilar adenopathy with findings suspicious for mildly progressive metastatic disease or chronic lymphoproliferative process, high-grade stenosis of the celiac axis, and small bilateral direct inguinal hernias.  Patient had been given Lasix 20 mg IV x 1 dose.  Review of Systems: As mentioned in the history of present illness. All other systems reviewed and are negative.  Limited as the patient has a history of dementia. Past Medical History:  Diagnosis Date   Bladder cancer    in situ   Cataracts, bilateral    Diabetes mellitus    insulin dependent   Gout    Hearing loss    Heart disease, unspecified    Hypercholesterolemia    Hypertension    Memory changes    Prostate cancer    s/p radiation   Sarcoidosis    Unspecified glaucoma(365.9)    Past Surgical History:  Procedure Laterality Date  BLADDER SURGERY     for carcinoma   CATARACT EXTRACTION Bilateral 2011   knee replacement surgery  1999   right   PROSTATECTOMY  2013   Social History:  reports that he quit smoking about 17 years ago. His smoking use included cigarettes. He smoked an average of .5 packs per day. He has never used smokeless tobacco. He reports that he does not drink alcohol and does not use drugs.  Allergies  Allergen Reactions   Ace Inhibitors Swelling    Benazepril   Allopurinol  Rash    Family History  Problem Relation Age of Onset   Hypertension Mother    Diabetes Mellitus I Mother    Hypertension Father    Coronary artery disease Father    Hypertension Sister    Hypertension Brother    Coronary artery disease Brother    Diabetes Mellitus I Brother    Diabetes Mellitus I Brother    Cancer Neg Hx     Prior to Admission medications   Medication Sig Start Date End Date Taking? Authorizing Provider  amLODipine (NORVASC) 10 MG tablet Take 1 tablet (10 mg total) by mouth daily. 10/30/17  Yes [name], [name], MD  aspirin 81 MG tablet Take 81 mg by mouth at bedtime.    Yes [provider]  atorvastatin (LIPITOR) 20 MG tablet Take 20 mg by mouth daily. 12/16/18  Yes [provider]  benzonatate (TESSALON) 100 MG capsule Take 1 capsule by mouth every 8 (eight) hours for cough. Patient taking differently: Take 100 mg by mouth daily as needed for cough. 10/01/21  Yes Vanessa Kick, MD  Blood Glucose Monitoring Suppl (ACCU-CHEK AVIVA PLUS) w/Device KIT Use to check blood sugar 3 times per day dx code E11.65 03/12/16  Yes [name], MD  carbidopa-levodopa (SINEMET IR) 25-100 MG tablet Take 1 pill in the morning, 1 pill in the afternoon, and 2 pills at night Patient taking differently: Take 1-2 tablets by mouth See admin instructions. Take 1 tablet by mouth in the morning, 1 tablet in the afternoon and 2 tablets at night 03/20/22  Yes Chima, Anderson Malta, MD  donepezil (ARICEPT) 10 MG tablet Take 1 tablet (10 mg total) by mouth at bedtime. 03/20/22  Yes Genia Harold, MD  dorzolamide-timolol (COSOPT) 22.3-6.8 MG/ML ophthalmic solution Place 1 drop into both eyes 2 (two) times daily.    Yes [provider]  escitalopram (LEXAPRO) 10 MG tablet Take 1 tablet (10 mg total) by mouth daily. 03/20/22  Yes Genia Harold, MD  ferrous sulfate 325 (65 FE) MG tablet Take 1 tablet (325 mg total) by mouth 2 (two) times daily. 10/30/17  Yes [name], [name], MD   fish oil-omega-3 fatty acids 1000 MG capsule Take 1 g by mouth daily.    Yes [provider]  GEMTESA 75 MG TABS Take 75 mg by mouth daily. 09/03/21  Yes [provider]  hydroxypropyl methylcellulose / hypromellose (ISOPTO TEARS / GONIOVISC) 2.5 % ophthalmic solution Place 1 drop into both eyes 4 (four) times daily as needed for dry eyes.    Yes [provider]  indomethacin (INDOCIN) 50 MG capsule Take 1 capsule (50 mg total) by mouth 2 (two) times daily as needed (gout). Take with food 12/24/19  Yes Bast, Traci A, NP  insulin regular (NOVOLIN R RELION) 100 units/mL injection Inject 0.05-0.15 mLs (5-15 Units total) into the skin 3 (three) times daily with meals as needed for high blood sugar (per sliding scale). 10/30/17  Yes [name],  [name], MD  latanoprost (XALATAN) 0.005 % ophthalmic solution Place 1 drop into both eyes at bedtime.  02/17/14  Yes [provider]  meclizine (ANTIVERT) 25 MG tablet Take 1 tablet (25 mg total) by mouth 3 (three) times daily as needed for dizziness. [id number]  Yes [name], MD  memantine (NAMENDA) 10 MG tablet Take 1 tablet (10 mg total) by mouth 2 (two) times daily. 03/20/22  Yes Genia Harold, MD  metFORMIN (GLUCOPHAGE) 1000 MG tablet Take 1 tablet (1,000 mg total) by mouth 2 (two) times daily with a meal. 10/30/17  Yes [name], [name], MD  metoprolol tartrate (LOPRESSOR) 25 MG tablet Take 1 tablet (25 mg total) by mouth 2 (two) times daily. 10/30/17  Yes [name], [name], MD  Multiple Vitamins-Minerals (CENTRUM SILVER ADULT 50+) TABS Take 1 tablet by mouth daily.   Yes [provider]  Multiple Vitamins-Minerals (PRESERVISION AREDS) TABS Take 1 capsule by mouth in the morning and at bedtime.   Yes [provider]  ACCU-CHEK AVIVA PLUS test strip USE AS INSTRUCTED TO CHECK BLOOD SUGAR THREE TIMES DAILY 11/11/16   [name], MD  Insulin Syringe-Needle U-100 (INSULIN SYRINGE .5CC/31GX5/16") 31G X 5/16" 0.5 ML  MISC Use one to inject insulin daily 05/02/15   [name], MD  tamsulosin (FLOMAX) 0.4 MG CAPS capsule Take 1 capsule (0.4 mg total) by mouth at bedtime. 10/30/17   [name], MD    Physical Exam: Vitals:   07/23/22 0233 07/23/22 0300 07/23/22 0504 07/23/22 0555  BP:  (!) 152/66 (!) 154/70 (!) 158/75  Pulse:  65 67 67  Resp:  17 15 19   Temp:  98.7 F (37.1 C) 98.8 F (37.1 C) 98.5 F (36.9 C)  TempSrc:  Oral Oral Oral  SpO2: 93% 96% 97% 98%  Weight:      Height:        Constitutional: Elderly male who appears to be in no acute distress. Eyes: PERRL, lids and conjunctivae normal ENMT: Mucous membranes are moist.   Neck: normal, supple, no JVD appreciated. Respiratory: clear to auscultation bilaterally, no wheezing, no crackles. Normal respiratory effort.  O2 saturations currently maintained on 2 L nasal cannula oxygen  Cardiovascular: Regular rate and rhythm, no murmurs / rubs / gallops.  Trace lower extremity edema. Abdomen: Mild tenderness to palpation appreciated.  Bowel sounds present. Musculoskeletal: no clubbing / cyanosis. No joint deformity upper and lower extremities. Good ROM, no contractures. Normal muscle tone.  Skin: no rashes, lesions, ulcers. No induration Neurologic: CN 2-12 grossly intact. Strength 5/5 in all 4.  Psychiatric: Alert and oriented to person and place.  Data Reviewed:  Review labs, imaging, pertinent records as noted above in HPI.  Assessment and Plan:   Abdominal pain  Suspected mesenteric ischemia Acute.  Patient presented with complaints of abdominal pain with bloating.  CT scan angiogram of the abdomen pelvis concerning for high-grade stenosis of the celiac axis with prominent atherosclerotic calcification at the origin of the superior mesenteric artery and inferior mesenteric artery as well as renal arteries bilaterally.  Question possibility of mesenteric ischemia vs. gastritis. -Admit to a telemetry bed -Check lipid panel -Check  mesenteric vascular duplex -Dr. Luan Pulling of vascular surgery consulted due to concern for mesenteric ischemia  Acute respiratory failure with hypoxia Patient was noted to have O2 saturations as low as 86% on room air with improvement on 2 L nasal cannula oxygen.  Normally not on oxygen at baseline, but had recently had a cough.  No reported or  shortness of breath and no significant wheezing noted on physical exam.  CT angiogram of the chest that noted no signs of a pulmonary embolism, but did note emphysematous changes.  He had quit smoking several years ago.  He had been unable to perform PFTs with pulmonology last year. -Continuous pulse oximetry with nasal cannula oxygen maintain O2 saturation greater than 92% -Weaned to room air as able  Acute kidney injury Creatinine elevated up to 1.66 with BUN 28.  Last available creatinine noted to be around 1 back in 03/2021.  He had reportedly recently been using for gout. -Avoid nephrotoxic agents -Continue to monitor kidney function daily  Lymphadenopathy History of sarcoidosis CT scan of the chest noted slight interval progression of mediastinal and hilar adenopathy.  Patient has a reported history of sarcoidosis.  Unclear if this is possibly related or not at this time. -Consider follow-up with his pulmonologist in the outpatient setting.  Microcytic hypochromic anemia Chronic.  Labs noted hemoglobin 12, MCV 75.6, MCH 24.8, and RDW 18.  Findings suggestive of iron deficiency.  He is on iron supplement. -Check iron studies -Continue iron supplements  Diabetes mellitus type 2, with long-term use of insulin. Last available hemoglobin A1c from 2019 was noted to be 10.  Medication includes regular insulin 5 to 15 units 3 times daily with meals. -Hypoglycemic protocols -Check hemoglobin A1c -Hold metformin -CBGs before every meal with moderate SSI -Adjust medication regimen as needed.  Essential hypertension Blood pressures initially elevated  158/75. -Continue home blood pressure regimen of amlodipine and metoprolol.  Hyperlipidemia -Follow-up lipid panel -Continue atorvastatin  History of dementia -Delirium precautions -Continue Aricept Namenda  History of prostate and bladder cancer Patient reportedly treated with prostatectomy and radiation.  History of gout Patient with a history of gout for which he has been prescribed indomethacin to use as needed. -Hold indomethacin  OSA -Continue CPAP at night  Advance Care Planning:   Code Status: Full Code  Consults: Vascular surgery  Family Communication: Wife updated at bedside  Severity of Illness: The appropriate patient status for this patient is INPATIENT. Inpatient status is judged to be reasonable and necessary in order to provide the required intensity of service to ensure the patient's safety. The patient's presenting symptoms, physical exam findings, and initial radiographic and laboratory data in the context of their chronic comorbidities is felt to place them at high risk for further clinical deterioration. Furthermore, it is not anticipated that the patient will be medically stable for discharge from the hospital within 2 midnights of admission.   * I certify that at the point of admission it is my clinical judgment that the patient will require inpatient hospital care spanning beyond 2 midnights from the point of admission due to high intensity of service, high risk for further deterioration and high frequency of surveillance required.*  Author: Norval Morton, MD 07/23/2022 8:47 AM  For on call review [email or web address].

## 2022-07-23 NOTE — Progress Notes (Signed)
New Admission Note:   Arrival Method: Arrived from [facility] ED via stretcher Mental Orientation: Alert and oriented x3 Telemetry: Tele box #6 Assessment: Completed Skin: Intact IV: NSL-LUA Pain: 0/10 Tubes: N/A Safety Measures: Safety Fall Prevention Plan has been discussed.  Admission: Completed 5MW Orientation: Patient has been oriented to the room, unit and staff.  Family: Wife at bedside  [facility] flow manager was notified of patient's arrival and the need for admission orders. Informed that day shift will be putting in orders for patient.Call light has been placed within reach and bed alarm has been activated.   [name] [employer], RN-BC Phone number: [phone]

## 2022-07-23 NOTE — ED Provider Notes (Signed)
Accepted handoff at shift change from [facility] PA-C. Please see prior provider note for more detail.   Briefly: Patient is 82 y.o.   "[name] is a 82 y.o. male, type 2 diabetes, hypertension, who presents to the ED secondary to diffuse abdominal pain, bilateral lower extremity swelling that is been going on for the last 2 days.  He also states that he had multiple episodes of diarrhea last week, which is since resolved.  He states he went to see urgent care, and his oxygen was 88% so they sent him here.  He denies any chest pain or shortness of breath at this time.  No history of heart failure per patient is not on any diuretics.  States he has gotten this bloated before and is just gone away."       DDX: concern for pulmonary embolism, fibrosis, pneumonia, pulmonary edema  Plan: CT PE study, CT abdomen pelvis    Physical Exam  BP (!) 152/66 (BP Location: Right Arm)   Pulse 65   Temp 98.7 F (37.1 C) (Oral)   Resp 17   Ht 6' (1.829 m)   Wt 86.2 kg   SpO2 96%   BMI 25.77 kg/m   Physical Exam  Procedures  Procedures  ED Course / MDM   Clinical Course as of 07/23/22 0414  Wed Jul 23, 2022  0342 PRESSION: 1. No pulmonary embolism. No acute intrathoracic or intra-abdominal pathology identified. 2. Extensive multi-vessel coronary artery calcification. 3. Mild paraseptal emphysema. 4. Mild subpleural and bibasilar predominant pulmonary fibrotic change. 5. Slight interval progression of pathologic mediastinal and hilar adenopathy. Slight interval increase in size of solitary enlarged periportal lymph node. Findings are suspicious for mildly progressive metastatic disease or a chronic lymphoproliferative process. 6. Mild distal colonic diverticulosis without superimposed acute inflammatory change. 7. High-grade stenosis of the celiac axis. Particularly prominent atherosclerotic calcification at the origin of the superior mesenteric artery and inferior mesenteric artery as  well as the renal arteries bilaterally, however, the degree of stenosis is not well assessed on this examination. If there is clinical evidence of chronic mesenteric ischemia or hemodynamically significant renal artery stenosis, CT arteriography may be more helpful for further evaluation. 8. Small bilateral direct inguinal hernias.   [WF]    Clinical Course User Index [WF] Tedd Sias, [place]   Medical Decision Making Amount and/or Complexity of Data Reviewed Labs: ordered. Radiology: ordered.  Risk Prescription drug management. Decision regarding hospitalization.   Patient was hypoxic 82 year old male present emergency room today with less than 1 month of progressively worsening shortness of breath and fatigue and found to be hypoxic in the emergency room stable on 2 L nasal cannula, also complaining of abdominal pain no bloody diarrhea.  No PE but there is some fibrotic changes found.  Some evidence of possible chronic mesenteric ischemia 1. No pulmonary embolism. No acute intrathoracic or intra-abdominal pathology identified. 2. Extensive multi-vessel coronary artery calcification. 3. Mild paraseptal emphysema. 4. Mild subpleural and bibasilar predominant pulmonary fibrotic change. 5. Slight interval progression of pathologic mediastinal and hilar adenopathy. Slight interval increase in size of solitary enlarged periportal lymph node. Findings are suspicious for mildly progressive metastatic disease or a chronic lymphoproliferative process. 6. Mild distal colonic diverticulosis without superimposed acute inflammatory change. 7. High-grade stenosis of the celiac axis. Particularly prominent atherosclerotic calcification at the origin of the superior mesenteric artery and inferior mesenteric artery as well as the renal arteries bilaterally, however, the degree of stenosis is not well assessed on  this examination. If there is clinical evidence of chronic mesenteric  ischemia or hemodynamically significant renal artery stenosis, CT arteriography may be more helpful for further evaluation. 8. Small bilateral direct inguinal hernias.  Will consult for admission.  Discussed w Dr. Sidney Ace who will admit.      [name], [place] 07/23/22 0450    Orpah Greek, MD 07/23/22 [phone]

## 2022-07-23 NOTE — ED Notes (Signed)
ED TO INPATIENT HANDOFF REPORT  ED Nurse Name and Phone #: Mechele Claude, [place]  S Name/Age/Gender [name] 82 y.o. male Room/Bed: H014C/H014C  Code Status   Code Status: Prior  Home/SNF/Other Home Patient oriented to: self, place, time, and situation Is this baseline? Yes   Triage Complete: Triage complete  Chief Complaint Acute respiratory failure with hypoxia [J96.01]  Triage Note Pt to ED via EMS from urgent care. Pt c/o diffuse abd pain and distention. Pt was also sating 88% RA at [facility] and was placed on 2L . Pt on RA baseline. Pt denies SOB.   EMS Vitals: 140/70 97% 2L 68 HR    Allergies Allergies  Allergen Reactions   Ace Inhibitors Swelling    Benazepril   Allopurinol Rash    Level of Care/Admitting Diagnosis ED Disposition     ED Disposition  Admit   Condition  --   Comment  Hospital Area: [place] [[id number]]  Level of Care: Telemetry Medical [104]  May admit patient to [facility] or [facility] if equivalent level of care is available:: No  Covid Evaluation: Asymptomatic - no recent exposure (last 10 days) testing not required  Diagnosis: Acute respiratory failure with hypoxia [id number]  Admitting Physician: Christel Mormon [id number]  Attending Physician: Christel Mormon [id number]  Certification:: I certify this patient will need inpatient services for at least 2 midnights  Estimated Length of Stay: 2          B Medical/Surgery History Past Medical History:  Diagnosis Date   Bladder cancer    in situ   Cataracts, bilateral    Diabetes mellitus    insulin dependent   Gout    Hearing loss    Heart disease, unspecified    Hypercholesterolemia    Hypertension    Memory changes    Prostate cancer    s/p radiation   Sarcoidosis    Unspecified glaucoma(365.9)    Past Surgical History:  Procedure Laterality Date   BLADDER SURGERY     for carcinoma   CATARACT EXTRACTION Bilateral 2011   knee replacement surgery   1999   right   PROSTATECTOMY  2013     A IV Location/Drains/Wounds Patient Lines/Drains/Airways Status     Active Line/Drains/Airways     Name Placement date Placement time Site Days   Peripheral IV 07/22/22 20 G Anterior;Distal;Left;Upper Arm 07/22/22  2125  Arm  1            Intake/Output Last 24 hours No intake or output data in the 24 hours ending 07/23/22 0503  Labs/Imaging Results for orders placed or performed during the hospital encounter of 07/22/22 (from the past 48 hour(s))  CBC with Differential     Status: Abnormal   Collection Time: 07/22/22  8:50 PM  Result Value Ref Range   WBC 7.0 4.0 - 10.5 K/uL   RBC 4.83 4.22 - 5.81 MIL/uL   Hemoglobin 12.0 (L) 13.0 - 17.0 g/dL   HCT 36.5 (L) 39.0 - 52.0 %   MCV 75.6 (L) 80.0 - 100.0 fL   MCH 24.8 (L) 26.0 - 34.0 pg   MCHC 32.9 30.0 - 36.0 g/dL   RDW 18.0 (H) 11.5 - 15.5 %   Platelets 201 150 - 400 K/uL   nRBC 0.0 0.0 - 0.2 %   Neutrophils Relative % 51 %   Neutro Abs 3.6 1.7 - 7.7 K/uL   Lymphocytes Relative 30 %   Lymphs Abs 2.1 0.7 -  4.0 K/uL   Monocytes Relative 16 %   Monocytes Absolute 1.1 (H) 0.1 - 1.0 K/uL   Eosinophils Relative 3 %   Eosinophils Absolute 0.2 0.0 - 0.5 K/uL   Basophils Relative 0 %   Basophils Absolute 0.0 0.0 - 0.1 K/uL   Immature Granulocytes 0 %   Abs Immature Granulocytes 0.03 0.00 - 0.07 K/uL    Comment: Performed at [facility], [place] [address], [place], [place] [address]  Comprehensive metabolic panel     Status: Abnormal   Collection Time: 07/22/22  8:50 PM  Result Value Ref Range   Sodium 140 135 - 145 mmol/L   Potassium 3.9 3.5 - 5.1 mmol/L   Chloride 105 98 - 111 mmol/L   CO2 22 22 - 32 mmol/L   Glucose, Bld 127 (H) 70 - 99 mg/dL    Comment: Glucose reference range applies only to samples taken after fasting for at least 8 hours.   BUN 28 (H) 8 - 23 mg/dL   Creatinine, Ser 1.66 (H) 0.61 - 1.24 mg/dL   Calcium 8.8 (L) 8.9 - 10.3 mg/dL   Total Protein 7.3 6.5 -  8.1 g/dL   Albumin 3.0 (L) 3.5 - 5.0 g/dL   AST 23 15 - 41 U/L   ALT 12 0 - 44 U/L   Alkaline Phosphatase 59 38 - 126 U/L   Total Bilirubin 0.5 0.3 - 1.2 mg/dL   GFR, Estimated 42 (L) >60 mL/min    Comment: (NOTE) Calculated using the CKD-EPI Creatinine Equation (2021)    Anion gap 13 5 - 15    Comment: Performed at [facility], [place] [address] [place], [place] [address]  Brain natriuretic peptide     Status: Abnormal   Collection Time: 07/22/22  8:50 PM  Result Value Ref Range   B Natriuretic Peptide 125.9 (H) 0.0 - 100.0 pg/mL    Comment: Performed at [facility] [address], [place], [place] [address]  Troponin I (High Sensitivity)     Status: None   Collection Time: 07/22/22  8:50 PM  Result Value Ref Range   Troponin I (High Sensitivity) 11 <18 ng/L    Comment: (NOTE) Elevated high sensitivity troponin I (hsTnI) values and significant  changes across serial measurements may suggest ACS but many other  chronic and acute conditions are known to elevate hsTnI results.  Refer to the "Links" section for chest pain algorithms and additional  guidance. Performed at [facility], [place] [address] [place], [place] [address]   Urinalysis, Routine w reflex microscopic -Urine, Clean Catch     Status: None   Collection Time: 07/22/22  9:25 PM  Result Value Ref Range   Color, Urine YELLOW YELLOW   APPearance CLEAR CLEAR   Specific Gravity, Urine 1.015 1.005 - 1.030   pH 5.0 5.0 - 8.0   Glucose, UA NEGATIVE NEGATIVE mg/dL   Hgb urine dipstick NEGATIVE NEGATIVE   Bilirubin Urine NEGATIVE NEGATIVE   Ketones, ur NEGATIVE NEGATIVE mg/dL   Protein, ur NEGATIVE NEGATIVE mg/dL   Nitrite NEGATIVE NEGATIVE   Leukocytes,Ua NEGATIVE NEGATIVE    Comment: Performed at [facility] [address], [place], [place] [address]  Troponin I (High Sensitivity)     Status: None   Collection Time: 07/22/22 10:17 PM  Result Value Ref Range   Troponin I (High Sensitivity)  11 <18 ng/L    Comment: (NOTE) Elevated high sensitivity troponin I (hsTnI)  values and significant  changes across serial measurements may suggest ACS but many other  chronic and acute conditions are known to elevate hsTnI results.  Refer to the "Links" section for chest pain algorithms and additional  guidance. Performed at [facility], [place] [address], [place], [place] [address]   CBG monitoring, ED     Status: Abnormal   Collection Time: 07/22/22 11:49 PM  Result Value Ref Range   Glucose-Capillary 115 (H) 70 - 99 mg/dL    Comment: Glucose reference range applies only to samples taken after fasting for at least 8 hours.  CBG monitoring, ED     Status: Abnormal   Collection Time: 07/23/22  4:04 AM  Result Value Ref Range   Glucose-Capillary 119 (H) 70 - 99 mg/dL    Comment: Glucose reference range applies only to samples taken after fasting for at least 8 hours.   CT ABDOMEN PELVIS W CONTRAST  Result Date: 07/23/2022 CLINICAL DATA:  Abdominal pain, acute, nonlocalized; Pulmonary embolism (PE) suspected, high prob, peripheral edema. History of prostate cancer and bladder cancer EXAM: CT ANGIOGRAPHY CHEST CT ABDOMEN AND PELVIS WITH CONTRAST TECHNIQUE: Multidetector CT imaging of the chest was performed using the standard protocol during bolus administration of intravenous contrast. Multiplanar CT image reconstructions and MIPs were obtained to evaluate the vascular anatomy. Multidetector CT imaging of the abdomen and pelvis was performed using the standard protocol during bolus administration of intravenous contrast. RADIATION DOSE REDUCTION: This exam was performed according to the departmental dose-optimization program which includes automated exposure control, adjustment of the mA and/or kV according to patient size and/or use of iterative reconstruction technique. CONTRAST:  47mL OMNIPAQUE IOHEXOL 350 MG/ML SOLN COMPARISON:  CT chest 10/12/2006, CT abdomen pelvis 06/10/2019 FINDINGS:  CTA CHEST FINDINGS Cardiovascular: There is adequate opacification of the pulmonary arterial tree. No intraluminal filling defect identified to suggest acute pulmonary embolism. The central pulmonary arteries are of normal caliber. Extensive multi-vessel coronary artery calcification. Global cardiac size is within normal limits. No pericardial effusion. Extensive atherosclerotic calcification within the thoracic aorta. No aortic aneurysm. Mediastinum/Nodes: Pathologic mediastinal and hilar adenopathy is again identified and appears slightly progressive since prior examination. Visualized thyroid is unremarkable. Esophagus is unremarkable. Lungs/Pleura: Mild paraseptal emphysema. Mild subpleural and bibasilar predominant pulmonary fibrotic change. 11 mm noncalcified pulmonary nodule within the right upper lobe anteriorly at axial image # 44/6 is stable when accounting for changes in slice selection and is safely considered benign. No follow-up imaging is recommended for this lesion. No superimposed focal pulmonary infiltrate. No pneumothorax or pleural effusion. No central obstructing lesion. Musculoskeletal: No acute bone abnormality. No lytic or blastic bone lesion Review of the MIP images confirms the above findings. CT ABDOMEN and PELVIS FINDINGS Hepatobiliary: Mild intra and extrahepatic biliary ductal dilation is stable. Liver otherwise unremarkable. Gallbladder unremarkable. Pancreas: Borderline dilation of the pancreatic duct within the pancreatic head is stable. Pancreas is otherwise unremarkable; no peripancreatic inflammatory changes are identified and there is normal enhancement of the pancreatic parenchyma. Spleen: Unremarkable Adrenals/Urinary Tract: The adrenal glands are unremarkable. The kidneys are normal in size and position. Multiple bilateral simple parapelvic cysts are identified for which no follow-up imaging is recommended. The kidneys are otherwise unremarkable. The bladder is unremarkable.  Stomach/Bowel: Mild distal colonic diverticulosis without superimposed acute inflammatory change. The stomach, small bowel, and large bowel are otherwise unremarkable. Appendix normal. Vascular/Lymphatic: Extensive aortoiliac atherosclerotic calcification. High-grade stenosis of the celiac axis noted. Particularly prominent atherosclerotic calcification is noted at the  origin of the superior mesenteric artery and inferior mesenteric artery as well as the renal arteries bilaterally, however, the degree of stenosis is not well assessed on this examination. Enlarged periportal lymph node demonstrates slight interval increase in size now measuring 18 mm in short axis diameter. No additional pathologic adenopathy within the abdomen and pelvis. Reproductive: Fiducial markers in place along the inferior gland. Prostate gland is of normal size. Other: Small bilateral direct inguinal hernias are again noted. Musculoskeletal: No acute bone abnormality. No lytic or blastic bone lesion. Review of the MIP images confirms the above findings. IMPRESSION: 1. No pulmonary embolism. No acute intrathoracic or intra-abdominal pathology identified. 2. Extensive multi-vessel coronary artery calcification. 3. Mild paraseptal emphysema. 4. Mild subpleural and bibasilar predominant pulmonary fibrotic change. 5. Slight interval progression of pathologic mediastinal and hilar adenopathy. Slight interval increase in size of solitary enlarged periportal lymph node. Findings are suspicious for mildly progressive metastatic disease or a chronic lymphoproliferative process. 6. Mild distal colonic diverticulosis without superimposed acute inflammatory change. 7. High-grade stenosis of the celiac axis. Particularly prominent atherosclerotic calcification at the origin of the superior mesenteric artery and inferior mesenteric artery as well as the renal arteries bilaterally, however, the degree of stenosis is not well assessed on this examination. If  there is clinical evidence of chronic mesenteric ischemia or hemodynamically significant renal artery stenosis, CT arteriography may be more helpful for further evaluation. 8. Small bilateral direct inguinal hernias. Aortic Atherosclerosis (ICD10-I70.0) and Emphysema (ICD10-J43.9). Electronically Signed   By: Fidela Salisbury M.D.   On: 07/23/2022 03:28   CT Angio Chest PE W/Cm &/Or Wo Cm  Result Date: 07/23/2022 CLINICAL DATA:  Abdominal pain, acute, nonlocalized; Pulmonary embolism (PE) suspected, high prob, peripheral edema. History of prostate cancer and bladder cancer EXAM: CT ANGIOGRAPHY CHEST CT ABDOMEN AND PELVIS WITH CONTRAST TECHNIQUE: Multidetector CT imaging of the chest was performed using the standard protocol during bolus administration of intravenous contrast. Multiplanar CT image reconstructions and MIPs were obtained to evaluate the vascular anatomy. Multidetector CT imaging of the abdomen and pelvis was performed using the standard protocol during bolus administration of intravenous contrast. RADIATION DOSE REDUCTION: This exam was performed according to the departmental dose-optimization program which includes automated exposure control, adjustment of the mA and/or kV according to patient size and/or use of iterative reconstruction technique. CONTRAST:  51mL OMNIPAQUE IOHEXOL 350 MG/ML SOLN COMPARISON:  CT chest 10/12/2006, CT abdomen pelvis 06/10/2019 FINDINGS: CTA CHEST FINDINGS Cardiovascular: There is adequate opacification of the pulmonary arterial tree. No intraluminal filling defect identified to suggest acute pulmonary embolism. The central pulmonary arteries are of normal caliber. Extensive multi-vessel coronary artery calcification. Global cardiac size is within normal limits. No pericardial effusion. Extensive atherosclerotic calcification within the thoracic aorta. No aortic aneurysm. Mediastinum/Nodes: Pathologic mediastinal and hilar adenopathy is again identified and appears  slightly progressive since prior examination. Visualized thyroid is unremarkable. Esophagus is unremarkable. Lungs/Pleura: Mild paraseptal emphysema. Mild subpleural and bibasilar predominant pulmonary fibrotic change. 11 mm noncalcified pulmonary nodule within the right upper lobe anteriorly at axial image # 44/6 is stable when accounting for changes in slice selection and is safely considered benign. No follow-up imaging is recommended for this lesion. No superimposed focal pulmonary infiltrate. No pneumothorax or pleural effusion. No central obstructing lesion. Musculoskeletal: No acute bone abnormality. No lytic or blastic bone lesion Review of the MIP images confirms the above findings. CT ABDOMEN and PELVIS FINDINGS Hepatobiliary: Mild intra and extrahepatic biliary ductal dilation is stable. Liver otherwise unremarkable.  Gallbladder unremarkable. Pancreas: Borderline dilation of the pancreatic duct within the pancreatic head is stable. Pancreas is otherwise unremarkable; no peripancreatic inflammatory changes are identified and there is normal enhancement of the pancreatic parenchyma. Spleen: Unremarkable Adrenals/Urinary Tract: The adrenal glands are unremarkable. The kidneys are normal in size and position. Multiple bilateral simple parapelvic cysts are identified for which no follow-up imaging is recommended. The kidneys are otherwise unremarkable. The bladder is unremarkable. Stomach/Bowel: Mild distal colonic diverticulosis without superimposed acute inflammatory change. The stomach, small bowel, and large bowel are otherwise unremarkable. Appendix normal. Vascular/Lymphatic: Extensive aortoiliac atherosclerotic calcification. High-grade stenosis of the celiac axis noted. Particularly prominent atherosclerotic calcification is noted at the origin of the superior mesenteric artery and inferior mesenteric artery as well as the renal arteries bilaterally, however, the degree of stenosis is not well assessed  on this examination. Enlarged periportal lymph node demonstrates slight interval increase in size now measuring 18 mm in short axis diameter. No additional pathologic adenopathy within the abdomen and pelvis. Reproductive: Fiducial markers in place along the inferior gland. Prostate gland is of normal size. Other: Small bilateral direct inguinal hernias are again noted. Musculoskeletal: No acute bone abnormality. No lytic or blastic bone lesion. Review of the MIP images confirms the above findings. IMPRESSION: 1. No pulmonary embolism. No acute intrathoracic or intra-abdominal pathology identified. 2. Extensive multi-vessel coronary artery calcification. 3. Mild paraseptal emphysema. 4. Mild subpleural and bibasilar predominant pulmonary fibrotic change. 5. Slight interval progression of pathologic mediastinal and hilar adenopathy. Slight interval increase in size of solitary enlarged periportal lymph node. Findings are suspicious for mildly progressive metastatic disease or a chronic lymphoproliferative process. 6. Mild distal colonic diverticulosis without superimposed acute inflammatory change. 7. High-grade stenosis of the celiac axis. Particularly prominent atherosclerotic calcification at the origin of the superior mesenteric artery and inferior mesenteric artery as well as the renal arteries bilaterally, however, the degree of stenosis is not well assessed on this examination. If there is clinical evidence of chronic mesenteric ischemia or hemodynamically significant renal artery stenosis, CT arteriography may be more helpful for further evaluation. 8. Small bilateral direct inguinal hernias. Aortic Atherosclerosis (ICD10-I70.0) and Emphysema (ICD10-J43.9). Electronically Signed   By: Fidela Salisbury M.D.   On: 07/23/2022 03:28   DG Chest 2 View  Result Date: 07/22/2022 CLINICAL DATA:  Shortness of breath. Bilateral lower extremity swelling. EXAM: CHEST - 2 VIEW COMPARISON:  Chest radiograph dated  12/10/2021. FINDINGS: Shallow inspiration with bibasilar atelectasis. There is mild vascular congestion. No focal consolidation, pleural effusion, or pneumothorax. Stable cardiac silhouette. Atherosclerotic calcification of the aorta. No acute osseous pathology. IMPRESSION: Mild vascular congestion. No focal consolidation. Electronically Signed   By: Anner Crete M.D.   On: 07/22/2022 21:13    Pending Labs Unresulted Labs (From admission, onward)     Start     Ordered   07/23/22 0344  Resp panel by RT-PCR (RSV, Flu A&B, Covid) Anterior Nasal Swab  Once,   URGENT        07/23/22 0343            Vitals/Pain Today's Vitals   07/23/22 0022 07/23/22 0232 07/23/22 0233 07/23/22 0300  BP:    (!) 152/66  Pulse:    65  Resp:    17  Temp: 98.6 F (37 C)   98.7 F (37.1 C)  TempSrc: Oral   Oral  SpO2:  (S) (!) 86% 93% 96%  Weight:      Height:      PainSc:  Isolation Precautions No active isolations  Medications Medications  furosemide (LASIX) injection 20 mg (20 mg Intravenous Given 07/22/22 2133)  iohexol (OMNIPAQUE) 350 MG/ML injection 75 mL (75 mLs Intravenous Contrast Given 07/23/22 0256)    Mobility walks with person assist     Focused Assessments Neuro Assessment Handoff:  Swallow screen pass? Yes          Neuro Assessment:   Neuro Checks:      Has TPA been given? No If patient is a Neuro Trauma and patient is going to OR before floor call report to [place] nurse: [phone] or [phone]   R Recommendations: See Admitting Provider Note  Report given to:   Additional Notes: pt is AAOx4. Pt is on 2L [place]. Pt is using urinal.

## 2022-07-23 NOTE — ED Notes (Signed)
Pt brought back from CT.

## 2022-07-23 NOTE — ED Notes (Signed)
Pt soiled himself. Pt cleaned. Full linen change. Pt dressed into clean scrubs.

## 2022-07-23 NOTE — Care Management (Deleted)
  Transition of Care [facility]) Screening Note   Patient Details  Name: [name] Date of Birth: [birth year]   Transition of Care [facility]) CM/SW Contact:    Levonne Lapping, RN Phone Number: 07/23/2022, 3:16 PM    Transition of Care Department [facility]) has reviewed patient We will continue to monitor patient advancement through interdisciplinary progression rounds. If new patient transition needs arise, please place a TOC consult.

## 2022-07-23 NOTE — Care Management (Signed)
  Transition of Care [facility]) Screening Note   Patient Details  Name: [name] Date of Birth: 04-10-1941   Transition of Care [facility]) CM/SW Contact:    Levonne Lapping, RN Phone Number: 07/23/2022, 3:10 PM    Transition of Care Department [facility]) has reviewed patient    We will continue to monitor patient advancement through interdisciplinary progression rounds. If new patient transition needs arise, please place a TOC consult.

## 2022-07-23 NOTE — Progress Notes (Signed)
Mobility Specialist Progress Note   07/23/22 1140  Mobility  Activity Ambulated with assistance to bathroom;Transferred from bed to chair  Level of Assistance Contact guard assist, steadying assist  Assistive Device Front wheel walker  Distance Ambulated (ft) 16 ft  Activity Response Tolerated well  Mobility Referral Yes  $Mobility charge 1 Mobility   Pre Mobility: 94% SpO2 on 2LO2 During Mobility: 84% SpO2 on RA Post Mobility: 89% SpO2 on 2LO2  Pt agreeable to mobility. Requesting to use BR prior to transfer to chair. StandbyA for bed mobility and CGA to stand during ambulation, x1 LOB upon standing but pt able to self correct quickly. Pt ambulating on RA w/o distress but when checking portable pulse ox while in BR pt's SpO2 desat to 84%, pt stating to be feeling fine. Successful BM. Returned to chair and placed back on 2LO2 w/o fault, pt SpO2 reading 89% after ~44mins of PLB. Placed call bell in reach, chair alarm on and notified RN.    [name] Mobility Specialist Please contact via SecureChat or  Rehab office at [phone]

## 2022-07-24 ENCOUNTER — Inpatient Hospital Stay (HOSPITAL_COMMUNITY): Payer: Medicare HMO

## 2022-07-24 DIAGNOSIS — R109 Unspecified abdominal pain: Secondary | ICD-10-CM

## 2022-07-24 DIAGNOSIS — I509 Heart failure, unspecified: Secondary | ICD-10-CM | POA: Diagnosis not present

## 2022-07-24 LAB — GLUCOSE, CAPILLARY
Glucose-Capillary: 118 mg/dL — ABNORMAL HIGH (ref 70–99)
Glucose-Capillary: 112 mg/dL — ABNORMAL HIGH (ref 70–99)
Glucose-Capillary: 122 mg/dL — ABNORMAL HIGH (ref 70–99)
Glucose-Capillary: 279 mg/dL — ABNORMAL HIGH (ref 70–99)

## 2022-07-24 LAB — BLOOD GAS, ARTERIAL
Acid-Base Excess: 5.8 mmol/L — ABNORMAL HIGH (ref 0.0–2.0)
Bicarbonate: 30.6 mmol/L — ABNORMAL HIGH (ref 20.0–28.0)
O2 Saturation: 93.4 %
Patient temperature: 36.8
pCO2 arterial: 44 mmHg (ref 32–48)
pH, Arterial: 7.45 (ref 7.35–7.45)
pO2, Arterial: 63 mmHg — ABNORMAL LOW (ref 83–108)

## 2022-07-24 LAB — BASIC METABOLIC PANEL
Anion gap: 6 (ref 5–15)
BUN: 18 mg/dL (ref 8–23)
CO2: 28 mmol/L (ref 22–32)
Calcium: 8.7 mg/dL — ABNORMAL LOW (ref 8.9–10.3)
Chloride: 104 mmol/L (ref 98–111)
Creatinine, Ser: 1.29 mg/dL — ABNORMAL HIGH (ref 0.61–1.24)
GFR, Estimated: 56 mL/min — ABNORMAL LOW (ref >60)
Glucose, Bld: 125 mg/dL — ABNORMAL HIGH (ref 70–99)
Potassium: 3.6 mmol/L (ref 3.5–5.1)
Sodium: 138 mmol/L (ref 135–145)

## 2022-07-24 LAB — CBC
HCT: 37.1 % — ABNORMAL LOW (ref 39.0–52.0)
Hemoglobin: 11.9 g/dL — ABNORMAL LOW (ref 13.0–17.0)
MCH: 24.4 pg — ABNORMAL LOW (ref 26.0–34.0)
MCHC: 32.1 g/dL (ref 30.0–36.0)
MCV: 76 fL — ABNORMAL LOW (ref 80.0–100.0)
Platelets: 200 10*3/uL (ref 150–400)
RBC: 4.88 MIL/uL (ref 4.22–5.81)
RDW: 17.6 % — ABNORMAL HIGH (ref 11.5–15.5)
WBC: 7.1 10*3/uL (ref 4.0–10.5)
nRBC: 0 % (ref 0.0–0.2)

## 2022-07-24 NOTE — Progress Notes (Signed)
Patient refuses CPAP for the night. Patient is stable at this time

## 2022-07-24 NOTE — Progress Notes (Signed)
Mobility Specialist Progress Note   07/24/22 1700  Mobility  Activity Transferred from bed to chair  Level of Assistance Contact guard assist, steadying assist  Assistive Device Front wheel walker  Distance Ambulated (ft) 4 ft  Activity Response Tolerated well  Mobility Referral Yes  $Mobility charge 1 Mobility   Pt agreeable to sit up in chair for kunch.Able to get EOB w/ stand by assist and stand w/ CGA. Ambulated to chair w/o fault or complaint. Left in chair w/ call bell in reach and chair alarm on.   Holland Falling Mobility Specialist Please contact via SecureChat or  Rehab office at [phone]

## 2022-07-24 NOTE — Hospital Course (Addendum)
81 y.o.m w/ HTN, HLD, Chronic diastolic CHF, T2DM,sarcoidosis, gout, and prostate cancer, vascular dementia, and remote tobacco abuse, history of prostate cancer treated with radiation who presented with complaints of abdominal pain and leg pain, recent episodes of nausea vomiting and diarrhea after coming in contact with a family member who was sick and also recently seen by PCP on 3/26 and diagnosed with UTI and given Cipro and Imodium x 7 days.He also c/o cough, has not been very active and has been sitting for long periods of time not wanting to get up or move around.  History is somewhat limited from the patient due to issues with memory, but his wife was at bedside to assist. In ON:[id number] with blood pressures elevated up to 158/75, and O2 saturations noted to be as low as 86% on room air with improvement on 2 L nasal cannula oxygen to greater than 92%.  Labs from 4/2 noted hemoglobin 12, BUN 28, creatinine 1.66, BNP 125.9, and high-sensitivity troponins negative x 2.  Chest x-ray noted mild vascular congestion with no focal consolidation.  Urinalysis showed no signs of infection.  Influenza, COVID-19, and RSV screening were negative.  CT angiogram of the chest, abdomen, and pelvis have been obtained.  No pulmonary embolus was noted, extensive multivessel coronary artery calcifications found with mild paraseptal emphysema, increased interval progression of mediastinal and hilar adenopathy with findings suspicious for mildly progressive metastatic disease or chronic lymphoproliferative process, high-grade stenosis of the celiac axis, and small bilateral direct inguinal hernias.  Patient had been given Lasix 20 mg IV x 1 dose.  Patient was admitted for further management. Abdominal pain has resolved no longer having diarrhea, tolerating diet.  Seen by pulmonary will arrange outpatient follow-up, he is needing oxygen while ambulation and it is being arranged.

## 2022-07-24 NOTE — Evaluation (Signed)
Occupational Therapy Evaluation Patient Details Name: [name] MRN: [id number] DOB: [birth year] Today's Date: 07/24/2022   History of Present Illness 82 y.o. male admitted with abdominal pain. Pt with suspected mesenteric ischemia (workup underway), acute respiratory failure with hypoxia & AKI. Medical history significant of hypertension, hyperlipidemia, diastolic CHF, diabetes mellitus type 2, sarcoidosis, gout, and prostate cancer, vascular dementia, and remote tobacco abuse.   Clinical Impression   PTA pt lives independently with his wife, who assists with IADL tasks. Wife reports a "big change" in her husband over the last month. She states he stays in the bed and has to be told to bath and to do activities that he use to do automatically. She also states that he "sleeps a lot". Pt on RA on entry and during activity SpO2 85 with good pleth. 2L applied and SpO2 increased to 95. Acute Ot will follow to facilitate safe DC home however do not anticipate need for OT after DC.       Recommendations for follow up therapy are one component of a multi-disciplinary discharge planning process, led by the attending physician.  Recommendations may be updated based on patient status, additional functional criteria and insurance authorization.   Assistance Recommended at Discharge Intermittent Supervision/Assistance  Patient can return home with the following Direct supervision/assist for medications management;Direct supervision/assist for financial management;Assist for transportation;Assistance with cooking/housework    Functional Status Assessment  Patient has had a recent decline in their functional status and demonstrates the ability to make significant improvements in function in a reasonable and predictable amount of time.  Equipment Recommendations  None recommended by OT    Recommendations for Other Services PT consult     Precautions / Restrictions Precautions Precautions:  Fall Precaution Comments: watch O2 sats      Mobility Bed Mobility Overal bed mobility: Independent                  Transfers Overall transfer level: Needs assistance Equipment used: None Transfers: Sit to/from Stand Sit to Stand: Supervision                  Balance Overall balance assessment: Mild deficits observed, not formally tested                                         ADL either performed or assessed with clinical judgement   ADL Overall ADL's : Needs assistance/impaired                                     Functional mobility during ADLs: Min guard General ADL Comments: overall set up/S for ADL tasks; able to stand x 10 min for ADL task; SpO2 85 on RA     Vision Baseline Vision/History: 0 No visual deficits Additional Comments: no reports of visual changes     Perception     Praxis      Pertinent Vitals/Pain Pain Assessment Pain Assessment: Faces Faces Pain Scale: Hurts a little bit Pain Location: B feet Pain Descriptors / Indicators: Discomfort Pain Intervention(s): Limited activity within patient's tolerance     Hand Dominance Right   Extremity/Trunk Assessment Upper Extremity Assessment Upper Extremity Assessment: Generalized weakness;Overall [facility] for tasks assessed   Lower Extremity Assessment Lower Extremity Assessment: Defer to PT evaluation   Cervical / Trunk Assessment Cervical /  Trunk Assessment: Normal   Communication Communication Communication: HOH   Cognition Arousal/Alertness: Awake/alert Behavior During Therapy: Flat affect Overall Cognitive Status: Impaired/Different from baseline Area of Impairment: Orientation, Attention, Memory, Safety/judgement, Awareness, Problem solving                 Orientation Level: Disoriented to, Time Current Attention Level: Selective Memory: Decreased short-term memory   Safety/Judgement: Decreased awareness of safety, Decreased awareness  of deficits Awareness: Emergent Problem Solving: Slow processing General Comments: wife reports increased confusion since bein in hospital     General Comments       Exercises Exercises: Other exercises Other Exercises Other Exercises: pursed lip breathing   Shoulder Instructions      Home Living Family/patient expects to be discharged to:: Private residence Living Arrangements: Spouse/significant other Available Help at Discharge: Available 24 hours/day Type of Home: House Home Access: Stairs to enter [employer] of Steps: 1 Entrance Stairs-Rails: None Home Layout: One level     Bathroom Shower/Tub: Corporate investment banker: Standard Bathroom Accessibility: Yes How Accessible: Accessible via walker Home Equipment: Rolling Walker (2 wheels);Shower seat          Prior Functioning/Environment Prior Level of Function : Independent/Modified Independent             Mobility Comments: uses a walking stick at times ADLs Comments: wife reports increased time in bed over the last month; disinterest in activities and decreased appetite and increased time sleeping        OT Problem List: Decreased strength;Decreased activity tolerance;Decreased knowledge of use of DME or AE;Cardiopulmonary status limiting activity      OT Treatment/Interventions: Self-care/ADL training;Therapeutic exercise;Energy conservation;DME and/or AE instruction;Therapeutic activities;Cognitive remediation/compensation;Patient/family education;Balance training    OT Goals(Current goals can be found in the care plan section) Acute Rehab OT Goals Patient Stated Goal: to get better OT Goal Formulation: With patient/family Time For Goal Achievement: 08/07/22 Potential to Achieve Goals: Good  OT Frequency: Min 2X/week    Co-evaluation              AM-PAC OT "6 Clicks" Daily Activity     Outcome Measure Help from another person eating meals?: None Help from  another person taking care of personal grooming?: A Little Help from another person toileting, which includes using toliet, bedpan, or urinal?: A Little Help from another person bathing (including washing, rinsing, drying)?: A Little Help from another person to put on and taking off regular upper body clothing?: A Little Help from another person to put on and taking off regular lower body clothing?: A Little 6 Click Score: 19   End of Session Equipment Utilized During Treatment: Gait belt;Oxygen (2L added) Nurse Communication: Mobility status;Other (comment) (O2 sats)  Activity Tolerance: Patient tolerated treatment well Patient left: in chair;with call bell/phone within reach;with chair alarm set;with family/visitor present  OT Visit Diagnosis: Unsteadiness on feet (R26.81);Muscle weakness (generalized) (M62.81);Other symptoms and signs involving cognitive function                Time: 1210-1239 OT Time Calculation (min): 29 min Charges:  OT General Charges $OT Visit: 1 Visit OT Evaluation $OT Eval Moderate Complexity: 1 Mod OT Treatments $Self Care/Home Management : 8-22 mins  Maurie Boettcher, OT/L   Acute OT Clinical Specialist Acute Rehabilitation Services Pager [phone] Office [phone]   [facility] 07/24/2022, 1:09 PM

## 2022-07-24 NOTE — Discharge Summary (Deleted)
PROGRESS NOTE [name]  [id number] DOB: 09-10-1940 DOA: 07/22/2022 PCP: [name], MD  Brief Narrative/Hospital Course: 82 y.o.m w/ HTN, HLD, Chronic diastolic CHF, [id number], gout, and prostate cancer, vascular dementia, and remote tobacco abuse, history of prostate cancer treated with radiation who presented with complaints of abdominal pain and leg pain, recent episodes of nausea vomiting and diarrhea after coming in contact with a family member who was sick and also recently seen by PCP on 3/26 and diagnosed with UTI and given Cipro and Imodium x 7 days.He also c/o cough, has not been very active and has been sitting for long periods of time not wanting to get up or move around.  History is somewhat limited from the patient due to issues with memory, but his wife was at bedside to assist. In EH:[id number] with blood pressures elevated up to 158/75, and O2 saturations noted to be as low as 86% on room air with improvement on 2 L nasal cannula oxygen to greater than 92%.  Labs from 4/2 noted hemoglobin 12, BUN 28, creatinine 1.66, BNP 125.9, and high-sensitivity troponins negative x 2.  Chest x-ray noted mild vascular congestion with no focal consolidation.  Urinalysis showed no signs of infection.  Influenza, COVID-19, and RSV screening were negative.  CT angiogram of the chest, abdomen, and pelvis have been obtained.  No pulmonary embolus was noted, extensive multivessel coronary artery calcifications found with mild paraseptal emphysema, increased interval progression of mediastinal and hilar adenopathy with findings suspicious for mildly progressive metastatic disease or chronic lymphoproliferative process, high-grade stenosis of the celiac axis, and small bilateral direct inguinal hernias.  Patient had been given Lasix 20 mg IV x 1 dose.  Patient was admitted for further management. Abdominal pain has resolved no longer having diarrhea, tolerating diet.    Subjective: Seen and  examined Was groggy earlier- weaned off to RA but needed oxygen again esp in sleep No abdomen pain or diarrhea Tolerating po well   Assessment and Plan: Principal Problem:   Abdominal pain Active Problems:   Mesenteric ischemia   Acute respiratory failure with hypoxia   AKI (acute kidney injury)   Lymphadenopathy   Microcytic hypochromic anemia   Type 2 diabetes mellitus with diabetic nephropathy   Essential hypertension   Hyperlipemia   Moderate dementia   Prostate cancer (HCC)   History of gout   Severe obstructive sleep apnea-hypopnea syndrome   Abdominal pain/6-week history of diarrhea:Seen by Dr. Rayfield Citizen evidence of chronic mesenteric ischemia advised outpatient follow-up.  Patient did undergo vascular ultrasound mesenteric- "70 to 99% stenosis in the superior mesenteric artery. Patent superior mesenteric artery with elevated velocity suggestive of  70-99% stenosis. Celiac artery and inferior mesenteric artery were not clearly Visualized" discussed w/ Dr Stanford Breed he does not advise any further intervention and will need outpatient follow up.No more abdominal pain diarrhea.   Acute hypoxic respiratory failure:No orthopnea, has mild leg edema b/l Smoked for 30-40 yr ago, 1/2 ppk, quit abt 20 yrs ago. Likely has underlying lung condition possible COPD. Per wife he was supposed to f/u with his pulmonary end of this month.Echocardiogram ordered  and weaning of oxygen PT OT ordered. Resume cpap tonight-asked Wfamily to bring his own  AKI: Creatinine improved to 1.2  Lymphadenopathy History of sarcoidosis: CT scan of the chest noted slight interval progression of mediastinal and hilar adenopathy.  Patient has a reported history of sarcoidosis.  Unclear if this is possibly related or not at this time. -Consider follow-up with his pulmonologist  in the outpatient setting  Microcytic anemia: Hemoglobin overall stable complete iron studies normal ferritin, slightly low iron,  follow-up with PCP.  Recent B12 was normal a year ago  Type 2 diabetes mellitus on long-term insulin-blood sugar control A1c 7.8, resume home meds Recent Labs  Lab 07/23/22 0941 07/23/22 1247 07/23/22 1648 07/23/22 2054 07/24/22 0726 07/24/22 1126 07/24/22 1518  GLUCAP  --    < > 198* 111* 118* 112* 122*  HGBA1C 7.8*  --   --   --   --   --   --    < > = values in this interval not displayed.     Hypertension: BP fairly stable he will continue home regimen Hyperlipidemia: Continue history statin Dementia-stable continue Aricept and Namenda Gout-hold NSAIDs due to AKI follow-up with PCP OSA-continue CPAP nightly at home History of prostate and bladder cancer status post treatment with prostatectomy and radiation  DVT prophylaxis: enoxaparin (LOVENOX) injection 40 mg Start: 07/23/22 1000 Code Status:   Code Status: Full Code Family Communication: plan of care discussed with patient/wife at bedside. Patient status is:  admitted as observation but remains hospitalized for ongoing  because of hypoxic respiratory failure Level of care: Telemetry Medical   Dispo: The patient is from: home             Anticipated disposition: home in 24 hrs Objective: Vitals last 24 hrs: Vitals:   07/24/22 0727 07/24/22 1002 07/24/22 1004 07/24/22 1006  BP: (!) 113/53     Pulse: 61     Resp: 17     Temp: 98.2 F (36.8 C)     TempSrc: Oral     SpO2: 98% (!) 86% 98% 94%  Weight:      Height:       Weight change: -0.783 kg  Physical Examination: General exam: alert awake, older than stated age HEENT:Oral mucosa moist, Ear/Nose WNL grossly Respiratory system: bilaterally clear BS, no use of accessory muscle Cardiovascular system: S1 & S2 +, No JVD. Gastrointestinal system: Abdomen soft,NT,ND, BS+ Nervous System:Alert, awake, moving extremities. Extremities: LE edema +,distal peripheral pulses palpable.  Skin: No rashes,no icterus. MSK: Normal muscle bulk,tone, power  Medications  reviewed:  Scheduled Meds:  amLODipine  10 mg Oral Daily   aspirin  81 mg Oral QHS   atorvastatin  20 mg Oral Daily   carbidopa-levodopa  1 tablet Oral BID   carbidopa-levodopa  2 tablet Oral QHS   donepezil  10 mg Oral QHS   dorzolamide-timolol  1 drop Both Eyes BID   enoxaparin (LOVENOX) injection  40 mg Subcutaneous Q24H   ferrous sulfate  325 mg Oral BID   insulin aspart  0-15 Units Subcutaneous TID WC   insulin aspart  0-5 Units Subcutaneous QHS   latanoprost  1 drop Both Eyes QHS   memantine  10 mg Oral BID   metoprolol tartrate  25 mg Oral BID   mirabegron ER  25 mg Oral Daily   sodium chloride flush  3 mL Intravenous Q12H   tamsulosin  0.4 mg Oral QHS   Continuous Infusions:    Diet Order             Diet regular Room service appropriate? Yes; Fluid consistency: Thin  Diet effective now                            Intake/Output Summary (Last 24 hours) at 07/24/2022 1636 Last data filed at 07/24/2022  1251 Gross per 24 hour  Intake 120 ml  Output 275 ml  Net -155 ml   Net IO Since Admission: -592 mL [07/24/22 1636]  Wt Readings from Last 3 Encounters:  07/24/22 85.4 kg  03/20/22 82.1 kg  03/18/22 82.1 kg     Unresulted Labs (From admission, onward)     Start     Ordered   07/24/22 1521  Blood gas, arterial  Once,   R        07/24/22 1521          Data Reviewed: I have personally reviewed following labs and imaging studies CBC: Recent Labs  Lab 07/22/22 2050 07/24/22 0320  WBC 7.0 7.1  NEUTROABS 3.6  --   HGB 12.0* 11.9*  HCT 36.5* 37.1*  MCV 75.6* 76.0*  PLT 201 [id number]   Basic Metabolic Panel: Recent Labs  Lab 07/22/22 2050 07/23/22 0941 07/24/22 0320  NA 140 142 138  K 3.9 3.2* 3.6  CL 105 103 104  CO2 22 28 28   GLUCOSE 127* 115* 125*  BUN 28* 16 18  CREATININE 1.66* 1.32* 1.29*  CALCIUM 8.8* 9.0 8.7*   GFR: Estimated Creatinine Clearance: 51 mL/min (A) (by C-G formula based on SCr of 1.29 mg/dL (H)). Liver Function  Tests: Recent Labs  Lab 07/22/22 2050  AST 23  ALT 12  ALKPHOS 59  BILITOT 0.5  PROT 7.3  ALBUMIN 3.0*   No results for input(s): "LIPASE", "AMYLASE" in the last 168 hours. No results for input(s): "AMMONIA" in the last 168 hours. Coagulation Profile: No results for input(s): "INR", "PROTIME" in the last 168 hours. BNP (last 3 results) No results for input(s): "PROBNP" in the last 8760 hours. HbA1C: Recent Labs    07/23/22 0941  HGBA1C 7.8*   CBG: Recent Labs  Lab 07/23/22 1648 07/23/22 2054 07/24/22 0726 07/24/22 1126 07/24/22 1518  GLUCAP 198* 111* 118* 112* 122*   Lipid Profile: Recent Labs    07/23/22 0941  CHOL 96  HDL 30*  LDLCALC 54  TRIG 59  CHOLHDL 3.2   Thyroid Function Tests: Recent Labs    07/23/22 0941  TSH 4.078   Sepsis Labs: Recent Labs  Lab 07/23/22 0941 07/23/22 1211  LATICACIDVEN 0.8 0.8    Recent Results (from the past 240 hour(s))  Resp panel by RT-PCR (RSV, Flu A&B, Covid) Anterior Nasal Swab     Status: None   Collection Time: 07/23/22  3:48 AM   Specimen: Anterior Nasal Swab  Result Value Ref Range Status   SARS Coronavirus 2 by RT PCR NEGATIVE NEGATIVE Final   Influenza A by PCR NEGATIVE NEGATIVE Final   Influenza B by PCR NEGATIVE NEGATIVE Final    Comment: (NOTE) The Xpert Xpress SARS-CoV-2/FLU/RSV plus assay is intended as an aid in the diagnosis of influenza from Nasopharyngeal swab specimens and should not be used as a sole basis for treatment. Nasal washings and aspirates are unacceptable for Xpert Xpress SARS-CoV-2/FLU/RSV testing.  Fact Sheet for Patients: EntrepreneurPulse.com.au  Fact Sheet for Healthcare Providers: IncredibleEmployment.be  This test is not yet approved or cleared by the [place] FDA and has been authorized for detection and/or diagnosis of SARS-CoV-2 by FDA under an Emergency Use Authorization (EUA). This EUA will remain in effect (meaning this  test can be used) for the duration of the COVID-19 declaration under Section 564(b)(1) of the Act, 21 U.S.C. section 360bbb-3(b)(1), unless the authorization is terminated or revoked.     Resp Syncytial Virus by  PCR NEGATIVE NEGATIVE Final    Comment: (NOTE) Fact Sheet for Patients: EntrepreneurPulse.com.au  Fact Sheet for Healthcare Providers: IncredibleEmployment.be  This test is not yet approved or cleared by the [place] FDA and has been authorized for detection and/or diagnosis of SARS-CoV-2 by FDA under an Emergency Use Authorization (EUA). This EUA will remain in effect (meaning this test can be used) for the duration of the COVID-19 declaration under Section 564(b)(1) of the Act, 21 U.S.C. section 360bbb-3(b)(1), unless the authorization is terminated or revoked.  Performed at [facility], [place] [address] [place], [place] [address]     Antimicrobials: Anti-infectives (From admission, onward)    None      Culture/Microbiology    Component Value Date/Time   SDES BLOOD LEFT ANTECUBITAL 10/28/2017 0950   SPECREQUEST  10/28/2017 0950    BOTTLES DRAWN AEROBIC ONLY Blood Culture adequate volume   CULT  10/28/2017 0950    NO GROWTH 5 DAYS Performed at [facility], [place] [address], [place], [place] [address]    REPTSTATUS 11/02/2017 FINAL 10/28/2017 0950    Other culture-see note  Radiology Studies: VAS [place] MESENTERIC  Result Date: 07/24/2022 ABDOMINAL VISCERAL Patient Name:  FARAJ OBORNY  Date of Exam:   07/24/2022 Medical Rec #: [id number]      Accession #:    [id number] Date of Birth: Sep 06, 1940       Patient Gender: M Patient Age:   73 years Exam Location:  [facility] Procedure:      VAS [place] MESENTERIC Referring Phys: [id number] Ephraim -------------------------------------------------------------------------------- Indications: Abdominal pain High Risk Factors: Hypertension, hyperlipidemia, Diabetes.  Limitations: Air/bowel gas and Technically limited study due to overlying bowel gas and difficulty imaging the vessels. Comparison Study: No priors. Performing Technologist: Oda Cogan RDMS, RVT  Examination Guidelines: A complete evaluation includes B-mode imaging, spectral Doppler, color Doppler, and power Doppler as needed of all accessible portions of each vessel. Bilateral testing is considered an integral part of a complete examination. Limited examinations for reoccurring indications may be performed as noted.  Duplex Findings: +----------------------+--------+--------+------+----------------------+ Mesenteric            PSV cm/sEDV cm/sPlaque       Comments        +----------------------+--------+--------+------+----------------------+ Aorta Prox               71                                        +----------------------+--------+--------+------+----------------------+ Aorta Mid                59                                        +----------------------+--------+--------+------+----------------------+ Celiac Artery Origin                         not well visualized   +----------------------+--------+--------+------+----------------------+ Celiac Artery Proximal  188                                        +----------------------+--------+--------+------+----------------------+ SMA Proximal            319                                        +----------------------+--------+--------+------+----------------------+  SMA Mid                 168                                        +----------------------+--------+--------+------+----------------------+ SMA Distal              175                                        +----------------------+--------+--------+------+----------------------+ CHA                     101                                        +----------------------+--------+--------+------+----------------------+ Splenic                                      not clearly visualized +----------------------+--------+--------+------+----------------------+ IMA                                             not visualized     +----------------------+--------+--------+------+----------------------+    Summary: Mesenteric: 70 to 99% stenosis in the superior mesenteric artery. Patent superior mesenteric artery with elevated velocity suggestive of 70-99% stenosis. Celiac artery and inferior mesenteric artery were not clearly visualized.  *See table(s) above for measurements and observations.  Diagnosing physician: Monica Martinez MD  Electronically signed by Monica Martinez MD on 07/24/2022 at 2:18:26 PM.    Final    CT ABDOMEN PELVIS W CONTRAST  Result Date: 07/23/2022 CLINICAL DATA:  Abdominal pain, acute, nonlocalized; Pulmonary embolism (PE) suspected, high prob, peripheral edema. History of prostate cancer and bladder cancer EXAM: CT ANGIOGRAPHY CHEST CT ABDOMEN AND PELVIS WITH CONTRAST TECHNIQUE: Multidetector CT imaging of the chest was performed using the standard protocol during bolus administration of intravenous contrast. Multiplanar CT image reconstructions and MIPs were obtained to evaluate the vascular anatomy. Multidetector CT imaging of the abdomen and pelvis was performed using the standard protocol during bolus administration of intravenous contrast. RADIATION DOSE REDUCTION: This exam was performed according to the departmental dose-optimization program which includes automated exposure control, adjustment of the mA and/or kV according to patient size and/or use of iterative reconstruction technique. CONTRAST:  71mL OMNIPAQUE IOHEXOL 350 MG/ML SOLN COMPARISON:  CT chest 10/12/2006, CT abdomen pelvis 06/10/2019 FINDINGS: CTA CHEST FINDINGS Cardiovascular: There is adequate opacification of the pulmonary arterial tree. No intraluminal filling defect identified to suggest acute pulmonary embolism. The central pulmonary  arteries are of normal caliber. Extensive multi-vessel coronary artery calcification. Global cardiac size is within normal limits. No pericardial effusion. Extensive atherosclerotic calcification within the thoracic aorta. No aortic aneurysm. Mediastinum/Nodes: Pathologic mediastinal and hilar adenopathy is again identified and appears slightly progressive since prior examination. Visualized thyroid is unremarkable. Esophagus is unremarkable. Lungs/Pleura: Mild paraseptal emphysema. Mild subpleural and bibasilar predominant pulmonary fibrotic change. 11 mm noncalcified pulmonary nodule within the right upper lobe anteriorly at axial image # 44/6 is stable when accounting for changes in slice  selection and is safely considered benign. No follow-up imaging is recommended for this lesion. No superimposed focal pulmonary infiltrate. No pneumothorax or pleural effusion. No central obstructing lesion. Musculoskeletal: No acute bone abnormality. No lytic or blastic bone lesion Review of the MIP images confirms the above findings. CT ABDOMEN and PELVIS FINDINGS Hepatobiliary: Mild intra and extrahepatic biliary ductal dilation is stable. Liver otherwise unremarkable. Gallbladder unremarkable. Pancreas: Borderline dilation of the pancreatic duct within the pancreatic head is stable. Pancreas is otherwise unremarkable; no peripancreatic inflammatory changes are identified and there is normal enhancement of the pancreatic parenchyma. Spleen: Unremarkable Adrenals/Urinary Tract: The adrenal glands are unremarkable. The kidneys are normal in size and position. Multiple bilateral simple parapelvic cysts are identified for which no follow-up imaging is recommended. The kidneys are otherwise unremarkable. The bladder is unremarkable. Stomach/Bowel: Mild distal colonic diverticulosis without superimposed acute inflammatory change. The stomach, small bowel, and large bowel are otherwise unremarkable. Appendix normal.  Vascular/Lymphatic: Extensive aortoiliac atherosclerotic calcification. High-grade stenosis of the celiac axis noted. Particularly prominent atherosclerotic calcification is noted at the origin of the superior mesenteric artery and inferior mesenteric artery as well as the renal arteries bilaterally, however, the degree of stenosis is not well assessed on this examination. Enlarged periportal lymph node demonstrates slight interval increase in size now measuring 18 mm in short axis diameter. No additional pathologic adenopathy within the abdomen and pelvis. Reproductive: Fiducial markers in place along the inferior gland. Prostate gland is of normal size. Other: Small bilateral direct inguinal hernias are again noted. Musculoskeletal: No acute bone abnormality. No lytic or blastic bone lesion. Review of the MIP images confirms the above findings. IMPRESSION: 1. No pulmonary embolism. No acute intrathoracic or intra-abdominal pathology identified. 2. Extensive multi-vessel coronary artery calcification. 3. Mild paraseptal emphysema. 4. Mild subpleural and bibasilar predominant pulmonary fibrotic change. 5. Slight interval progression of pathologic mediastinal and hilar adenopathy. Slight interval increase in size of solitary enlarged periportal lymph node. Findings are suspicious for mildly progressive metastatic disease or a chronic lymphoproliferative process. 6. Mild distal colonic diverticulosis without superimposed acute inflammatory change. 7. High-grade stenosis of the celiac axis. Particularly prominent atherosclerotic calcification at the origin of the superior mesenteric artery and inferior mesenteric artery as well as the renal arteries bilaterally, however, the degree of stenosis is not well assessed on this examination. If there is clinical evidence of chronic mesenteric ischemia or hemodynamically significant renal artery stenosis, CT arteriography may be more helpful for further evaluation. 8. Small  bilateral direct inguinal hernias. Aortic Atherosclerosis (ICD10-I70.0) and Emphysema (ICD10-J43.9). Electronically Signed   By: Fidela Salisbury M.D.   On: 07/23/2022 03:28   CT Angio Chest PE W/Cm &/Or Wo Cm  Result Date: 07/23/2022 CLINICAL DATA:  Abdominal pain, acute, nonlocalized; Pulmonary embolism (PE) suspected, high prob, peripheral edema. History of prostate cancer and bladder cancer EXAM: CT ANGIOGRAPHY CHEST CT ABDOMEN AND PELVIS WITH CONTRAST TECHNIQUE: Multidetector CT imaging of the chest was performed using the standard protocol during bolus administration of intravenous contrast. Multiplanar CT image reconstructions and MIPs were obtained to evaluate the vascular anatomy. Multidetector CT imaging of the abdomen and pelvis was performed using the standard protocol during bolus administration of intravenous contrast. RADIATION DOSE REDUCTION: This exam was performed according to the departmental dose-optimization program which includes automated exposure control, adjustment of the mA and/or kV according to patient size and/or use of iterative reconstruction technique. CONTRAST:  107mL OMNIPAQUE IOHEXOL 350 MG/ML SOLN COMPARISON:  CT chest 10/12/2006, CT abdomen pelvis 06/10/2019  FINDINGS: CTA CHEST FINDINGS Cardiovascular: There is adequate opacification of the pulmonary arterial tree. No intraluminal filling defect identified to suggest acute pulmonary embolism. The central pulmonary arteries are of normal caliber. Extensive multi-vessel coronary artery calcification. Global cardiac size is within normal limits. No pericardial effusion. Extensive atherosclerotic calcification within the thoracic aorta. No aortic aneurysm. Mediastinum/Nodes: Pathologic mediastinal and hilar adenopathy is again identified and appears slightly progressive since prior examination. Visualized thyroid is unremarkable. Esophagus is unremarkable. Lungs/Pleura: Mild paraseptal emphysema. Mild subpleural and bibasilar  predominant pulmonary fibrotic change. 11 mm noncalcified pulmonary nodule within the right upper lobe anteriorly at axial image # 44/6 is stable when accounting for changes in slice selection and is safely considered benign. No follow-up imaging is recommended for this lesion. No superimposed focal pulmonary infiltrate. No pneumothorax or pleural effusion. No central obstructing lesion. Musculoskeletal: No acute bone abnormality. No lytic or blastic bone lesion Review of the MIP images confirms the above findings. CT ABDOMEN and PELVIS FINDINGS Hepatobiliary: Mild intra and extrahepatic biliary ductal dilation is stable. Liver otherwise unremarkable. Gallbladder unremarkable. Pancreas: Borderline dilation of the pancreatic duct within the pancreatic head is stable. Pancreas is otherwise unremarkable; no peripancreatic inflammatory changes are identified and there is normal enhancement of the pancreatic parenchyma. Spleen: Unremarkable Adrenals/Urinary Tract: The adrenal glands are unremarkable. The kidneys are normal in size and position. Multiple bilateral simple parapelvic cysts are identified for which no follow-up imaging is recommended. The kidneys are otherwise unremarkable. The bladder is unremarkable. Stomach/Bowel: Mild distal colonic diverticulosis without superimposed acute inflammatory change. The stomach, small bowel, and large bowel are otherwise unremarkable. Appendix normal. Vascular/Lymphatic: Extensive aortoiliac atherosclerotic calcification. High-grade stenosis of the celiac axis noted. Particularly prominent atherosclerotic calcification is noted at the origin of the superior mesenteric artery and inferior mesenteric artery as well as the renal arteries bilaterally, however, the degree of stenosis is not well assessed on this examination. Enlarged periportal lymph node demonstrates slight interval increase in size now measuring 18 mm in short axis diameter. No additional pathologic adenopathy  within the abdomen and pelvis. Reproductive: Fiducial markers in place along the inferior gland. Prostate gland is of normal size. Other: Small bilateral direct inguinal hernias are again noted. Musculoskeletal: No acute bone abnormality. No lytic or blastic bone lesion. Review of the MIP images confirms the above findings. IMPRESSION: 1. No pulmonary embolism. No acute intrathoracic or intra-abdominal pathology identified. 2. Extensive multi-vessel coronary artery calcification. 3. Mild paraseptal emphysema. 4. Mild subpleural and bibasilar predominant pulmonary fibrotic change. 5. Slight interval progression of pathologic mediastinal and hilar adenopathy. Slight interval increase in size of solitary enlarged periportal lymph node. Findings are suspicious for mildly progressive metastatic disease or a chronic lymphoproliferative process. 6. Mild distal colonic diverticulosis without superimposed acute inflammatory change. 7. High-grade stenosis of the celiac axis. Particularly prominent atherosclerotic calcification at the origin of the superior mesenteric artery and inferior mesenteric artery as well as the renal arteries bilaterally, however, the degree of stenosis is not well assessed on this examination. If there is clinical evidence of chronic mesenteric ischemia or hemodynamically significant renal artery stenosis, CT arteriography may be more helpful for further evaluation. 8. Small bilateral direct inguinal hernias. Aortic Atherosclerosis (ICD10-I70.0) and Emphysema (ICD10-J43.9). Electronically Signed   By: Fidela Salisbury M.D.   On: 07/23/2022 03:28   DG Chest 2 View  Result Date: 07/22/2022 CLINICAL DATA:  Shortness of breath. Bilateral lower extremity swelling. EXAM: CHEST - 2 VIEW COMPARISON:  Chest radiograph dated 12/10/2021. FINDINGS: Shallow  inspiration with bibasilar atelectasis. There is mild vascular congestion. No focal consolidation, pleural effusion, or pneumothorax. Stable cardiac  silhouette. Atherosclerotic calcification of the aorta. No acute osseous pathology. IMPRESSION: Mild vascular congestion. No focal consolidation. Electronically Signed   By: Anner Crete M.D.   On: 07/22/2022 21:13     LOS: 1 day   Antonieta Pert, MD [facility]  07/24/2022, 4:36 PM

## 2022-07-24 NOTE — Progress Notes (Signed)
  Echocardiogram 2D Echocardiogram has been performed.  [name] 07/24/2022, 4:14 PM

## 2022-07-24 NOTE — Plan of Care (Signed)

## 2022-07-25 ENCOUNTER — Inpatient Hospital Stay (HOSPITAL_COMMUNITY): Payer: Medicare HMO

## 2022-07-25 DIAGNOSIS — J9601 Acute respiratory failure with hypoxia: Secondary | ICD-10-CM | POA: Diagnosis not present

## 2022-07-25 LAB — BASIC METABOLIC PANEL
Anion gap: 9 (ref 5–15)
BUN: 17 mg/dL (ref 8–23)
CO2: 26 mmol/L (ref 22–32)
Calcium: 8.5 mg/dL — ABNORMAL LOW (ref 8.9–10.3)
Chloride: 103 mmol/L (ref 98–111)
Creatinine, Ser: 1.33 mg/dL — ABNORMAL HIGH (ref 0.61–1.24)
GFR, Estimated: 54 mL/min — ABNORMAL LOW (ref >60)
Glucose, Bld: 174 mg/dL — ABNORMAL HIGH (ref 70–99)
Potassium: 3.7 mmol/L (ref 3.5–5.1)
Sodium: 138 mmol/L (ref 135–145)

## 2022-07-25 LAB — CBC
HCT: 35.7 % — ABNORMAL LOW (ref 39.0–52.0)
Hemoglobin: 11.8 g/dL — ABNORMAL LOW (ref 13.0–17.0)
MCH: 24.7 pg — ABNORMAL LOW (ref 26.0–34.0)
MCHC: 33.1 g/dL (ref 30.0–36.0)
MCV: 74.7 fL — ABNORMAL LOW (ref 80.0–100.0)
Platelets: 180 10*3/uL (ref 150–400)
RBC: 4.78 MIL/uL (ref 4.22–5.81)
RDW: 17.4 % — ABNORMAL HIGH (ref 11.5–15.5)
WBC: 7.5 10*3/uL (ref 4.0–10.5)
nRBC: 0 % (ref 0.0–0.2)

## 2022-07-25 LAB — ECHOCARDIOGRAM COMPLETE
Area-P 1/2: 3.27 cm2
Height: 72 in
MV M vel: 0.49 m/s
MV Peak grad: 0.9 mmHg
P 1/2 time: 539 msec
S' Lateral: 2.5 cm
Weight: 3012.37 oz

## 2022-07-25 LAB — GLUCOSE, CAPILLARY
Glucose-Capillary: 141 mg/dL — ABNORMAL HIGH (ref 70–99)
Glucose-Capillary: 267 mg/dL — ABNORMAL HIGH (ref 70–99)

## 2022-07-25 LAB — BRAIN NATRIURETIC PEPTIDE: B Natriuretic Peptide: 57.8 pg/mL (ref 0.0–100.0)

## 2022-07-25 MED ORDER — BISACODYL 10 MG RE SUPP
10.0000 mg | Freq: Once | RECTAL | Status: AC
  Administered 2022-07-25: 10 mg via RECTAL
  Filled 2022-07-25: qty 1

## 2022-07-25 MED ORDER — ALBUTEROL SULFATE HFA 108 (90 BASE) MCG/ACT IN AERS: 2.0000 | INHALATION_SPRAY | Freq: Four times a day (QID) | RESPIRATORY_TRACT | 2 refills | Status: DC | PRN

## 2022-07-25 NOTE — Progress Notes (Signed)
PROGRESS NOTE [name]            ZOX:[id number] DOB: [birth year] DOA: 07/22/2022 PCP: Laurann Montana, MD        Brief Narrative/Hospital Course: 82 y.o.m w/ HTN, HLD, Chronic diastolic CHF, T2DM,sarcoidosis, gout, and prostate cancer, vascular dementia, and remote tobacco abuse, history of prostate cancer treated with radiation who presented with complaints of abdominal pain and leg pain, recent episodes of nausea vomiting and diarrhea after coming in contact with a family member who was sick and also recently seen by PCP on 3/26 and diagnosed with UTI and given Cipro and Imodium x 7 days.He also c/o cough, has not been very active and has been sitting for long periods of time not wanting to get up or move around.  History is somewhat limited from the patient due to issues with memory, but his wife was at bedside to assist. In WJ:[id number] with blood pressures elevated up to 158/75, and O2 saturations noted to be as low as 86% on room air with improvement on 2 L nasal cannula oxygen to greater than 92%.  Labs from 4/2 noted hemoglobin 12, BUN 28, creatinine 1.66, BNP 125.9, and high-sensitivity troponins negative x 2.  Chest x-ray noted mild vascular congestion with no focal consolidation.  Urinalysis showed no signs of infection.  Influenza, COVID-19, and RSV screening were negative.  CT angiogram of the chest, abdomen, and pelvis have been obtained.  No pulmonary embolus was noted, extensive multivessel coronary artery calcifications found with mild paraseptal emphysema, increased interval progression of mediastinal and hilar adenopathy with findings suspicious for mildly progressive metastatic disease or chronic lymphoproliferative process, high-grade stenosis of the celiac axis, and small bilateral direct inguinal hernias.  Patient had been given Lasix 20 mg IV x 1 dose.  Patient was admitted for further management. Abdominal pain has resolved no longer having diarrhea, tolerating diet.     Subjective: Seen and examined Was groggy earlier- weaned off to RA but needed oxygen again esp in sleep No abdomen pain or diarrhea Tolerating po well   Assessment and Plan: Principal Problem:   Abdominal pain Active Problems:   Mesenteric ischemia   Acute respiratory failure with hypoxia   AKI (acute kidney injury)   Lymphadenopathy   Microcytic hypochromic anemia   Type 2 diabetes mellitus with diabetic nephropathy   Essential hypertension   Hyperlipemia   Moderate dementia   Prostate cancer (HCC)   History of gout   Severe obstructive sleep apnea-hypopnea syndrome   Abdominal pain/6-week history of diarrhea:Seen by Dr. Concepcion Living evidence of chronic mesenteric ischemia advised outpatient follow-up.  Patient did undergo vascular ultrasound mesenteric- "70 to 99% stenosis in the superior mesenteric artery. Patent superior mesenteric artery with elevated velocity suggestive of  70-99% stenosis. Celiac artery and inferior mesenteric artery were not clearly Visualized" discussed w/ Dr Lenell Antu he does not advise any further intervention and will need outpatient follow up.No more abdominal pain diarrhea.    Acute hypoxic respiratory failure:No orthopnea, has mild leg edema b/l Smoked for 30-40 yr ago, 1/2 ppk, quit abt 20 yrs ago. Likely has underlying lung condition possible COPD. Per wife he was supposed to f/u with his pulmonary end of this month.Echocardiogram ordered  and weaning of oxygen PT OT ordered. Resume cpap tonight-asked Wfamily to bring his own   AKI: Creatinine improved to 1.2   Lymphadenopathy History of sarcoidosis: CT scan of the chest noted slight interval progression of mediastinal and hilar adenopathy.  Patient has a reported  history of sarcoidosis.  Unclear if this is possibly related or not at this time. -Consider follow-up with his pulmonologist in the outpatient setting   Microcytic anemia: Hemoglobin overall stable complete iron studies normal ferritin,  slightly low iron, follow-up with PCP.  Recent B12 was normal a year ago   Type 2 diabetes mellitus on long-term insulin-blood sugar control A1c 7.8, resume home meds Last Labs           Recent Labs  Lab 07/23/22 0941 07/23/22 1247 07/23/22 1648 07/23/22 2054 07/24/22 0726 07/24/22 1126 07/24/22 1518  GLUCAP  --    < > 198* 111* 118* 112* 122*  HGBA1C 7.8*  --   --   --   --   --   --    < > = values in this interval not displayed.       Hypertension: BP fairly stable he will continue home regimen Hyperlipidemia: Continue history statin Dementia-stable continue Aricept and Namenda Gout-hold NSAIDs due to AKI follow-up with PCP OSA-continue CPAP nightly at home History of prostate and bladder cancer status post treatment with prostatectomy and radiation   DVT prophylaxis: enoxaparin (LOVENOX) injection 40 mg Start: 07/23/22 1000 Code Status:   Code Status: Full Code Family Communication: plan of care discussed with patient/wife at bedside. Patient status is:  admitted as observation but remains hospitalized for ongoing  because of hypoxic respiratory failure Level of care: Telemetry Medical    Dispo: The patient is from: home             Anticipated disposition: home in 24 hrs Objective: Vitals last 24 hrs:       Vitals:    07/24/22 0727 07/24/22 1002 07/24/22 1004 07/24/22 1006  BP: (!) 113/53        Pulse: 61        Resp: 17        Temp: 98.2 F (36.8 C)        TempSrc: Oral        SpO2: 98% (!) 86% 98% 94%  Weight:          Height:            Weight change: -0.783 kg   Physical Examination: General exam: alert awake, older than stated age HEENT:Oral mucosa moist, Ear/Nose WNL grossly Respiratory system: bilaterally clear BS, no use of accessory muscle Cardiovascular system: S1 & S2 +, No JVD. Gastrointestinal system: Abdomen soft,NT,ND, BS+ Nervous System:Alert, awake, moving extremities. Extremities: LE edema +,distal peripheral pulses palpable.  Skin: No  rashes,no icterus. MSK: Normal muscle bulk,tone, power   Medications reviewed:  Scheduled Meds:  amLODipine  10 mg Oral Daily   aspirin  81 mg Oral QHS   atorvastatin  20 mg Oral Daily   carbidopa-levodopa  1 tablet Oral BID   carbidopa-levodopa  2 tablet Oral QHS   donepezil  10 mg Oral QHS   dorzolamide-timolol  1 drop Both Eyes BID   enoxaparin (LOVENOX) injection  40 mg Subcutaneous Q24H   ferrous sulfate  325 mg Oral BID   insulin aspart  0-15 Units Subcutaneous TID WC   insulin aspart  0-5 Units Subcutaneous QHS   latanoprost  1 drop Both Eyes QHS   memantine  10 mg Oral BID   metoprolol tartrate  25 mg Oral BID   mirabegron ER  25 mg Oral Daily   sodium chloride flush  3 mL Intravenous Q12H   tamsulosin  0.4 mg Oral QHS  Continuous Infusions:   Diet Order                  Diet regular Room service appropriate? Yes; Fluid consistency: Thin  Diet effective now                             Intake/Output Summary (Last 24 hours) at 07/24/2022 1636 Last data filed at 07/24/2022 1251    Gross per 24 hour  Intake 120 ml  Output 275 ml  Net -155 ml    Net IO Since Admission: -592 mL [07/24/22 1636]     Wt Readings from Last 3 Encounters:  07/24/22 85.4 kg  03/20/22 82.1 kg  03/18/22 82.1 kg      Unresulted Labs (From admission, onward)        Start     Ordered    07/24/22 1521   Blood gas, arterial  Once,   R        07/24/22 1521              Data Reviewed: I have personally reviewed following labs and imaging studies CBC: Last Labs      Recent Labs  Lab 07/22/22 2050 07/24/22 0320  WBC 7.0 7.1  NEUTROABS 3.6  --   HGB 12.0* 11.9*  HCT 36.5* 37.1*  MCV 75.6* 76.0*  PLT 201 200      Basic Metabolic Panel: Last Labs       Recent Labs  Lab 07/22/22 2050 07/23/22 0941 07/24/22 0320  NA 140 142 138  K 3.9 3.2* 3.6  CL 105 103 104  CO2 22 28 28   GLUCOSE 127* 115* 125*  BUN 28* 16 18  CREATININE 1.66* 1.32* 1.29*  CALCIUM 8.8* 9.0  8.7*      GFR: Estimated Creatinine Clearance: 51 mL/min (A) (by C-G formula based on SCr of 1.29 mg/dL (H)). Liver Function Tests: Last Labs     Recent Labs  Lab 07/22/22 2050  AST 23  ALT 12  ALKPHOS 59  BILITOT 0.5  PROT 7.3  ALBUMIN 3.0*      Last Labs  No results for input(s): "LIPASE", "AMYLASE" in the last 168 hours.   Last Labs  No results for input(s): "AMMONIA" in the last 168 hours.   Coagulation Profile: Last Labs  No results for input(s): "INR", "PROTIME" in the last 168 hours.   BNP (last 3 results) Recent Labs (within last 365 days)  No results for input(s): "PROBNP" in the last 8760 hours.   HbA1C: Recent Labs (last 2 labs)     Recent Labs    07/23/22 0941  HGBA1C 7.8*      CBG: Last Labs         Recent Labs  Lab 07/23/22 1648 07/23/22 2054 07/24/22 0726 07/24/22 1126 07/24/22 1518  GLUCAP 198* 111* 118* 112* 122*      Lipid Profile: Recent Labs (last 2 labs)     Recent Labs    07/23/22 0941  CHOL 96  HDL 30*  LDLCALC 54  TRIG 59  CHOLHDL 3.2      Thyroid Function Tests: Recent Labs (last 2 labs)     Recent Labs    07/23/22 0941  TSH 4.078      Sepsis Labs: Last Labs      Recent Labs  Lab 07/23/22 0941 07/23/22 1211  LATICACIDVEN 0.8 0.8  Recent Results (from the past 240 hour(s))  Resp panel by RT-PCR (RSV, Flu A&B, Covid) Anterior Nasal Swab     Status: None    Collection Time: 07/23/22  3:48 AM    Specimen: Anterior Nasal Swab  Result Value Ref Range Status    SARS Coronavirus 2 by RT PCR NEGATIVE NEGATIVE Final    Influenza A by PCR NEGATIVE NEGATIVE Final    Influenza B by PCR NEGATIVE NEGATIVE Final      Comment: (NOTE) The Xpert Xpress SARS-CoV-2/FLU/RSV plus assay is intended as an aid in the diagnosis of influenza from Nasopharyngeal swab specimens and should not be used as a sole basis for treatment. Nasal washings and aspirates are unacceptable for Xpert Xpress  SARS-CoV-2/FLU/RSV testing.   Fact Sheet for Patients: BloggerCourse.com   Fact Sheet for Healthcare Providers: SeriousBroker.it   This test is not yet approved or cleared by the [place] FDA and has been authorized for detection and/or diagnosis of SARS-CoV-2 by FDA under an Emergency Use Authorization (EUA). This EUA will remain in effect (meaning this test can be used) for the duration of the COVID-19 declaration under Section 564(b)(1) of the Act, 21 U.S.C. section 360bbb-3(b)(1), unless the authorization is terminated or revoked.        Resp Syncytial Virus by PCR NEGATIVE NEGATIVE Final      Comment: (NOTE) Fact Sheet for Patients: BloggerCourse.com   Fact Sheet for Healthcare Providers: SeriousBroker.it   This test is not yet approved or cleared by the [place] FDA and has been authorized for detection and/or diagnosis of SARS-CoV-2 by FDA under an Emergency Use Authorization (EUA). This EUA will remain in effect (meaning this test can be used) for the duration of the COVID-19 declaration under Section 564(b)(1) of the Act, 21 U.S.C. section 360bbb-3(b)(1), unless the authorization is terminated or revoked.   Performed at [facility], [address], [place], [place] [address]      Antimicrobials: Anti-infectives (From admission, onward)        None           Culture/Microbiology Labs (Brief)           Component Value Date/Time    SDES BLOOD LEFT ANTECUBITAL 10/28/2017 0950    SPECREQUEST   10/28/2017 0950      BOTTLES DRAWN AEROBIC ONLY Blood Culture adequate volume    CULT   10/28/2017 0950      NO GROWTH 5 DAYS Performed at [facility], [address], [place], [place] [address]      REPTSTATUS 11/02/2017 FINAL 10/28/2017 2841      Other culture-see note  Radiology Studies:  Imaging Results (Last 48 hours)  VAS [place]  MESENTERIC   Result Date: 07/24/2022 ABDOMINAL VISCERAL Patient Name:  [name]  Date of Exam:   07/24/2022 Medical Rec #: [id number]      Accession #:    [id number] Date of Birth: May 22, 1940       Patient Gender: M Patient Age:   54 years Exam Location:  [facility] [place] Procedure:      VAS [place] MESENTERIC Referring Phys: [id number] RONDELL A SMITH -------------------------------------------------------------------------------- Indications: Abdominal pain High Risk Factors: Hypertension, hyperlipidemia, Diabetes. Limitations: Air/bowel gas and Technically limited study due to overlying bowel gas and difficulty imaging the vessels. Comparison Study: No priors. Performing Technologist: Marilynne Halsted RDMS, RVT  Examination Guidelines: A complete evaluation includes B-mode imaging, spectral Doppler, color Doppler, and power Doppler as needed of all accessible  portions of each vessel. Bilateral testing is considered an integral part of a complete examination. Limited examinations for reoccurring indications may be performed as noted.  Duplex Findings: +----------------------+--------+--------+------+----------------------+ Mesenteric            PSV cm/sEDV cm/sPlaque       Comments        +----------------------+--------+--------+------+----------------------+ Aorta Prox               71                                        +----------------------+--------+--------+------+----------------------+ Aorta Mid                59                                        +----------------------+--------+--------+------+----------------------+ Celiac Artery Origin                         not well visualized   +----------------------+--------+--------+------+----------------------+ Celiac Artery Proximal  188                                        +----------------------+--------+--------+------+----------------------+ SMA Proximal            319                                         +----------------------+--------+--------+------+----------------------+ SMA Mid                 168                                        +----------------------+--------+--------+------+----------------------+ SMA Distal              175                                        +----------------------+--------+--------+------+----------------------+ CHA                     101                                        +----------------------+--------+--------+------+----------------------+ Splenic                                     not clearly visualized +----------------------+--------+--------+------+----------------------+ IMA                                             not visualized     +----------------------+--------+--------+------+----------------------+    Summary: Mesenteric: 70 to 99% stenosis in the superior mesenteric artery. Patent superior mesenteric artery with elevated velocity suggestive of  70-99% stenosis. Celiac artery and inferior mesenteric artery were not clearly visualized.  *See table(s) above for measurements and observations.  Diagnosing physician: Sherald Hess MD  Electronically signed by Sherald Hess MD on 07/24/2022 at 2:18:26 PM.    Final     CT ABDOMEN PELVIS W CONTRAST   Result Date: 07/23/2022 CLINICAL DATA:  Abdominal pain, acute, nonlocalized; Pulmonary embolism (PE) suspected, high prob, peripheral edema. History of prostate cancer and bladder cancer EXAM: CT ANGIOGRAPHY CHEST CT ABDOMEN AND PELVIS WITH CONTRAST TECHNIQUE: Multidetector CT imaging of the chest was performed using the standard protocol during bolus administration of intravenous contrast. Multiplanar CT image reconstructions and MIPs were obtained to evaluate the vascular anatomy. Multidetector CT imaging of the abdomen and pelvis was performed using the standard protocol during bolus administration of intravenous contrast. RADIATION DOSE REDUCTION: This exam was performed  according to the departmental dose-optimization program which includes automated exposure control, adjustment of the mA and/or kV according to patient size and/or use of iterative reconstruction technique. CONTRAST:  75mL OMNIPAQUE IOHEXOL 350 MG/ML SOLN COMPARISON:  CT chest 10/12/2006, CT abdomen pelvis 06/10/2019 FINDINGS: CTA CHEST FINDINGS Cardiovascular: There is adequate opacification of the pulmonary arterial tree. No intraluminal filling defect identified to suggest acute pulmonary embolism. The central pulmonary arteries are of normal caliber. Extensive multi-vessel coronary artery calcification. Global cardiac size is within normal limits. No pericardial effusion. Extensive atherosclerotic calcification within the thoracic aorta. No aortic aneurysm. Mediastinum/Nodes: Pathologic mediastinal and hilar adenopathy is again identified and appears slightly progressive since prior examination. Visualized thyroid is unremarkable. Esophagus is unremarkable. Lungs/Pleura: Mild paraseptal emphysema. Mild subpleural and bibasilar predominant pulmonary fibrotic change. 11 mm noncalcified pulmonary nodule within the right upper lobe anteriorly at axial image # 44/6 is stable when accounting for changes in slice selection and is safely considered benign. No follow-up imaging is recommended for this lesion. No superimposed focal pulmonary infiltrate. No pneumothorax or pleural effusion. No central obstructing lesion. Musculoskeletal: No acute bone abnormality. No lytic or blastic bone lesion Review of the MIP images confirms the above findings. CT ABDOMEN and PELVIS FINDINGS Hepatobiliary: Mild intra and extrahepatic biliary ductal dilation is stable. Liver otherwise unremarkable. Gallbladder unremarkable. Pancreas: Borderline dilation of the pancreatic duct within the pancreatic head is stable. Pancreas is otherwise unremarkable; no peripancreatic inflammatory changes are identified and there is normal enhancement of  the pancreatic parenchyma. Spleen: Unremarkable Adrenals/Urinary Tract: The adrenal glands are unremarkable. The kidneys are normal in size and position. Multiple bilateral simple parapelvic cysts are identified for which no follow-up imaging is recommended. The kidneys are otherwise unremarkable. The bladder is unremarkable. Stomach/Bowel: Mild distal colonic diverticulosis without superimposed acute inflammatory change. The stomach, small bowel, and large bowel are otherwise unremarkable. Appendix normal. Vascular/Lymphatic: Extensive aortoiliac atherosclerotic calcification. High-grade stenosis of the celiac axis noted. Particularly prominent atherosclerotic calcification is noted at the origin of the superior mesenteric artery and inferior mesenteric artery as well as the renal arteries bilaterally, however, the degree of stenosis is not well assessed on this examination. Enlarged periportal lymph node demonstrates slight interval increase in size now measuring 18 mm in short axis diameter. No additional pathologic adenopathy within the abdomen and pelvis. Reproductive: Fiducial markers in place along the inferior gland. Prostate gland is of normal size. Other: Small bilateral direct inguinal hernias are again noted. Musculoskeletal: No acute bone abnormality. No lytic or blastic bone lesion. Review of the MIP images confirms the above findings. IMPRESSION: 1. No pulmonary embolism. No acute intrathoracic or  intra-abdominal pathology identified. 2. Extensive multi-vessel coronary artery calcification. 3. Mild paraseptal emphysema. 4. Mild subpleural and bibasilar predominant pulmonary fibrotic change. 5. Slight interval progression of pathologic mediastinal and hilar adenopathy. Slight interval increase in size of solitary enlarged periportal lymph node. Findings are suspicious for mildly progressive metastatic disease or a chronic lymphoproliferative process. 6. Mild distal colonic diverticulosis without  superimposed acute inflammatory change. 7. High-grade stenosis of the celiac axis. Particularly prominent atherosclerotic calcification at the origin of the superior mesenteric artery and inferior mesenteric artery as well as the renal arteries bilaterally, however, the degree of stenosis is not well assessed on this examination. If there is clinical evidence of chronic mesenteric ischemia or hemodynamically significant renal artery stenosis, CT arteriography may be more helpful for further evaluation. 8. Small bilateral direct inguinal hernias. Aortic Atherosclerosis (ICD10-I70.0) and Emphysema (ICD10-J43.9). Electronically Signed   By: Helyn Numbers M.D.   On: 07/23/2022 03:28    CT Angio Chest PE W/Cm &/Or Wo Cm   Result Date: 07/23/2022 CLINICAL DATA:  Abdominal pain, acute, nonlocalized; Pulmonary embolism (PE) suspected, high prob, peripheral edema. History of prostate cancer and bladder cancer EXAM: CT ANGIOGRAPHY CHEST CT ABDOMEN AND PELVIS WITH CONTRAST TECHNIQUE: Multidetector CT imaging of the chest was performed using the standard protocol during bolus administration of intravenous contrast. Multiplanar CT image reconstructions and MIPs were obtained to evaluate the vascular anatomy. Multidetector CT imaging of the abdomen and pelvis was performed using the standard protocol during bolus administration of intravenous contrast. RADIATION DOSE REDUCTION: This exam was performed according to the departmental dose-optimization program which includes automated exposure control, adjustment of the mA and/or kV according to patient size and/or use of iterative reconstruction technique. CONTRAST:  82mL OMNIPAQUE IOHEXOL 350 MG/ML SOLN COMPARISON:  CT chest 10/12/2006, CT abdomen pelvis 06/10/2019 FINDINGS: CTA CHEST FINDINGS Cardiovascular: There is adequate opacification of the pulmonary arterial tree. No intraluminal filling defect identified to suggest acute pulmonary embolism. The central pulmonary  arteries are of normal caliber. Extensive multi-vessel coronary artery calcification. Global cardiac size is within normal limits. No pericardial effusion. Extensive atherosclerotic calcification within the thoracic aorta. No aortic aneurysm. Mediastinum/Nodes: Pathologic mediastinal and hilar adenopathy is again identified and appears slightly progressive since prior examination. Visualized thyroid is unremarkable. Esophagus is unremarkable. Lungs/Pleura: Mild paraseptal emphysema. Mild subpleural and bibasilar predominant pulmonary fibrotic change. 11 mm noncalcified pulmonary nodule within the right upper lobe anteriorly at axial image # 44/6 is stable when accounting for changes in slice selection and is safely considered benign. No follow-up imaging is recommended for this lesion. No superimposed focal pulmonary infiltrate. No pneumothorax or pleural effusion. No central obstructing lesion. Musculoskeletal: No acute bone abnormality. No lytic or blastic bone lesion Review of the MIP images confirms the above findings. CT ABDOMEN and PELVIS FINDINGS Hepatobiliary: Mild intra and extrahepatic biliary ductal dilation is stable. Liver otherwise unremarkable. Gallbladder unremarkable. Pancreas: Borderline dilation of the pancreatic duct within the pancreatic head is stable. Pancreas is otherwise unremarkable; no peripancreatic inflammatory changes are identified and there is normal enhancement of the pancreatic parenchyma. Spleen: Unremarkable Adrenals/Urinary Tract: The adrenal glands are unremarkable. The kidneys are normal in size and position. Multiple bilateral simple parapelvic cysts are identified for which no follow-up imaging is recommended. The kidneys are otherwise unremarkable. The bladder is unremarkable. Stomach/Bowel: Mild distal colonic diverticulosis without superimposed acute inflammatory change. The stomach, small bowel, and large bowel are otherwise unremarkable. Appendix normal.  Vascular/Lymphatic: Extensive aortoiliac atherosclerotic calcification. High-grade stenosis of the celiac  axis noted. Particularly prominent atherosclerotic calcification is noted at the origin of the superior mesenteric artery and inferior mesenteric artery as well as the renal arteries bilaterally, however, the degree of stenosis is not well assessed on this examination. Enlarged periportal lymph node demonstrates slight interval increase in size now measuring 18 mm in short axis diameter. No additional pathologic adenopathy within the abdomen and pelvis. Reproductive: Fiducial markers in place along the inferior gland. Prostate gland is of normal size. Other: Small bilateral direct inguinal hernias are again noted. Musculoskeletal: No acute bone abnormality. No lytic or blastic bone lesion. Review of the MIP images confirms the above findings. IMPRESSION: 1. No pulmonary embolism. No acute intrathoracic or intra-abdominal pathology identified. 2. Extensive multi-vessel coronary artery calcification. 3. Mild paraseptal emphysema. 4. Mild subpleural and bibasilar predominant pulmonary fibrotic change. 5. Slight interval progression of pathologic mediastinal and hilar adenopathy. Slight interval increase in size of solitary enlarged periportal lymph node. Findings are suspicious for mildly progressive metastatic disease or a chronic lymphoproliferative process. 6. Mild distal colonic diverticulosis without superimposed acute inflammatory change. 7. High-grade stenosis of the celiac axis. Particularly prominent atherosclerotic calcification at the origin of the superior mesenteric artery and inferior mesenteric artery as well as the renal arteries bilaterally, however, the degree of stenosis is not well assessed on this examination. If there is clinical evidence of chronic mesenteric ischemia or hemodynamically significant renal artery stenosis, CT arteriography may be more helpful for further evaluation. 8. Small  bilateral direct inguinal hernias. Aortic Atherosclerosis (ICD10-I70.0) and Emphysema (ICD10-J43.9). Electronically Signed   By: Helyn NumbersAshesh  Parikh M.D.   On: 07/23/2022 03:28    DG Chest 2 View   Result Date: 07/22/2022 CLINICAL DATA:  Shortness of breath. Bilateral lower extremity swelling. EXAM: CHEST - 2 VIEW COMPARISON:  Chest radiograph dated 12/10/2021. FINDINGS: Shallow inspiration with bibasilar atelectasis. There is mild vascular congestion. No focal consolidation, pleural effusion, or pneumothorax. Stable cardiac silhouette. Atherosclerotic calcification of the aorta. No acute osseous pathology. IMPRESSION: Mild vascular congestion. No focal consolidation. Electronically Signed   By: Elgie CollardArash  Radparvar M.D.   On: 07/22/2022 21:13        LOS: 1 day    Lanae Boastamesh Rynell Ciotti, MD [facility]   07/24/2022, 4:36 PM

## 2022-07-25 NOTE — Discharge Summary (Signed)
Physician Discharge Summary  [name] JOA:[id number] DOB: April 28, 1940 DOA: 07/22/2022  PCP: Laurann Montana, MD  Admit date: 07/22/2022 Discharge date: 07/25/2022 Recommendations for Outpatient Follow-up:  Follow up with PCP in 1 weeks-call for appointment Please obtain BMP/CBC in one week  Discharge Dispo: Home Discharge Condition: Stable Code Status:   Code Status: Full Code Diet recommendation:  Diet Order             Diet regular Room service appropriate? Yes; Fluid consistency: Thin  Diet effective now                    Brief/Interim Summary: 81 y.o.m w/ HTN, HLD, Chronic diastolic CHF, T2DM,sarcoidosis, gout, and prostate cancer, vascular dementia, and remote tobacco abuse, history of prostate cancer treated with radiation who presented with complaints of abdominal pain and leg pain, recent episodes of nausea vomiting and diarrhea after coming in contact with a family member who was sick and also recently seen by PCP on 3/26 and diagnosed with UTI and given Cipro and Imodium x 7 days.He also c/o cough, has not been very active and has been sitting for long periods of time not wanting to get up or move around.  History is somewhat limited from the patient due to issues with memory, but his wife was at bedside to assist. In SW:FUXNATFT with blood pressures elevated up to 158/75, and O2 saturations noted to be as low as 86% on room air with improvement on 2 L nasal cannula oxygen to greater than 92%.  Labs from 4/2 noted hemoglobin 12, BUN 28, creatinine 1.66, BNP 125.9, and high-sensitivity troponins negative x 2.  Chest x-ray noted mild vascular congestion with no focal consolidation.  Urinalysis showed no signs of infection.  Influenza, COVID-19, and RSV screening were negative.  CT angiogram of the chest, abdomen, and pelvis have been obtained.  No pulmonary embolus was noted, extensive multivessel coronary artery calcifications found with mild paraseptal emphysema, increased interval  progression of mediastinal and hilar adenopathy with findings suspicious for mildly progressive metastatic disease or chronic lymphoproliferative process, high-grade stenosis of the celiac axis, and small bilateral direct inguinal hernias.  Patient had been given Lasix 20 mg IV x 1 dose.  Patient was admitted for further management. Abdominal pain has resolved no longer having diarrhea, tolerating diet.  Seen by pulmonary will arrange outpatient follow-up, he is needing oxygen while ambulation and it is being arranged.   Discharge Diagnoses:  Principal Problem:   Abdominal pain Active Problems:   Mesenteric ischemia   Acute respiratory failure with hypoxia   AKI (acute kidney injury)   Lymphadenopathy   Microcytic hypochromic anemia   Type 2 diabetes mellitus with diabetic nephropathy   Essential hypertension   Hyperlipemia   Moderate dementia   Prostate cancer (HCC)   History of gout   Severe obstructive sleep apnea-hypopnea syndrome  Abdominal pain/6-week history of diarrhea:Seen by Dr. Concepcion Living evidence of chronic mesenteric ischemia advised outpatient follow-up.  Patient did undergo vascular ultrasound mesenteric- "70 to 99% stenosis in the superior mesenteric artery. Patent superior mesenteric artery with elevated velocity suggestive of  70-99% stenosis. Celiac artery and inferior mesenteric artery were not clearly Visualized" discussed w/ Dr Lenell Antu he does not advise any further intervention and will need outpatient follow up.No more abdominal pain diarrhea.    Acute hypoxic respiratory failure:No orthopnea, has mild leg edema b/l, but BNP borderline 125.9, lungs clear to auscultation. He smoked for 30-40 yr ago, 1/2 ppk, quit abt 20  yrs ago.  Hypoxia likely in the setting of patient's sleep apnea and likely chronic w/ underlying lung condition possible COPD.  Imaging showed emphysema.  Seen by pulmonary.  Echo shows stable EF, no he is needing oxygen 1 unit for discharge on home  oxygen will be arranged, he can follow-up with outpatient.  Continue CPAP bedtime.   AKI: Creatinine has improved with IV fluid hydration.    Lymphadenopathy History of sarcoidosis: CT scan of the chest noted slight interval progression of mediastinal and hilar adenopathy.  Patient has a reported history of sarcoidosis.  Unclear if this is possibly related or not at this time. Seen by pulmonary Dialyte as outpatient follow-up   Microcytic anemia: Hemoglobin overall stable complete iron studies normal ferritin, slightly low iron, follow-up with PCP.  Recent B12 was normal a year ago   Type 2 diabetes mellitus on long-term insulin-blood sugar stable A1c 7.8 continue home regimen upon discharge  Hypertension: BP fairly stable he will continue home regimen Hyperlipidemia: Continue history statin Dementia-stable continue Aricept and Namenda Gout-hold NSAIDs due to AKI follow-up with PCP OSA-continue CPAP nightly at home History of prostate and bladder cancer status post treatment with prostatectomy and radiation  Consults: Pulmonary Subjective: Alert awake oriented poor Overall feeling much improved not getting oxygen at rest but needing unable,Eager to go home today He did well with physical therapy and no further needs  Discharge Exam: Vitals:   07/25/22 0510 07/25/22 0935  BP: 130/60 133/61  Pulse: 66 78  Resp: 15 18  Temp: 98.1 F (36.7 C) 98.5 F (36.9 C)  SpO2: 92% 91%   General: Pt is alert, awake, not in acute distress Cardiovascular: RRR, S1/S2 +, no rubs, no gallops Respiratory: CTA bilaterally, no wheezing, no rhonchi Abdominal: Soft, NT, ND, bowel sounds + Extremities: no edema, no cyanosis  Discharge Instructions  Discharge Instructions     Discharge instructions   Complete by: As directed    Please call call MD or return to ER for similar or worsening recurring problem that brought you to hospital or if any fever,nausea/vomiting,abdominal pain, uncontrolled  pain, chest pain,  shortness of breath or any other alarming symptoms.  Please follow-up your doctor as instructed in a week time and call the office for appointment.  Please avoid alcohol, smoking, or any other illicit substance and maintain healthy habits including taking your regular medications as prescribed.  You were cared for by a hospitalist during your hospital stay. If you have any questions about your discharge medications or the care you received while you were in the hospital after you are discharged, you can call the unit and ask to speak with the hospitalist on call if the hospitalist that took care of you is not available.  Once you are discharged, your primary care physician will handle any further medical issues. Please note that NO REFILLS for any discharge medications will be authorized once you are discharged, as it is imperative that you return to your primary care physician (or establish a relationship with a primary care physician if you do not have one) for your aftercare needs so that they can reassess your need for medications and monitor your lab values   Increase activity slowly   Complete by: As directed       Allergies as of 07/25/2022       Reactions   Ace Inhibitors Swelling   Benazepril   Allopurinol Rash        Medication List  TAKE these medications    Accu-Chek Aviva Plus test strip Generic drug: glucose blood USE AS INSTRUCTED TO CHECK BLOOD SUGAR THREE TIMES DAILY   Accu-Chek Aviva Plus w/Device Kit Use to check blood sugar 3 times per day dx code E11.65   albuterol 108 (90 Base) MCG/ACT inhaler Commonly known as: VENTOLIN HFA Inhale 2 puffs into the lungs every 6 (six) hours as needed for wheezing or shortness of breath.   amLODipine 10 MG tablet Commonly known as: NORVASC Take 1 tablet (10 mg total) by mouth daily.   aspirin 81 MG tablet Take 81 mg by mouth at bedtime.   atorvastatin 20 MG tablet Commonly known as:  LIPITOR Take 20 mg by mouth daily.   benzonatate 100 MG capsule Commonly known as: TESSALON Take 1 capsule by mouth every 8 (eight) hours for cough. What changed:  how much to take how to take this when to take this reasons to take this additional instructions   carbidopa-levodopa 25-100 MG tablet Commonly known as: SINEMET IR Take 1 pill in the morning, 1 pill in the afternoon, and 2 pills at night What changed:  how much to take how to take this when to take this additional instructions   Centrum Silver Adult 50+ Tabs Take 1 tablet by mouth daily.   PreserVision AREDS Tabs Take 1 capsule by mouth in the morning and at bedtime.   donepezil 10 MG tablet Commonly known as: ARICEPT Take 1 tablet (10 mg total) by mouth at bedtime.   dorzolamide-timolol 2-0.5 % ophthalmic solution Commonly known as: COSOPT Place 1 drop into both eyes 2 (two) times daily.   escitalopram 10 MG tablet Commonly known as: Lexapro Take 1 tablet (10 mg total) by mouth daily.   ferrous sulfate 325 (65 FE) MG tablet Take 1 tablet (325 mg total) by mouth 2 (two) times daily.   fish oil-omega-3 fatty acids 1000 MG capsule Take 1 g by mouth daily.   Gemtesa 75 MG Tabs Generic drug: Vibegron Take 75 mg by mouth daily.   hydroxypropyl methylcellulose / hypromellose 2.5 % ophthalmic solution Commonly known as: ISOPTO TEARS / GONIOVISC Place 1 drop into both eyes 4 (four) times daily as needed for dry eyes.   indomethacin 50 MG capsule Commonly known as: INDOCIN Take 1 capsule (50 mg total) by mouth 2 (two) times daily as needed (gout). Take with food   insulin regular 100 units/mL injection Commonly known as: NovoLIN R ReliOn Inject 0.05-0.15 mLs (5-15 Units total) into the skin 3 (three) times daily with meals as needed for high blood sugar (per sliding scale).   INSULIN SYRINGE .5CC/31GX5/16" 31G X 5/16" 0.5 ML Misc Use one to inject insulin daily   latanoprost 0.005 % ophthalmic  solution Commonly known as: XALATAN Place 1 drop into both eyes at bedtime.   meclizine 25 MG tablet Commonly known as: ANTIVERT Take 1 tablet (25 mg total) by mouth 3 (three) times daily as needed for dizziness.   memantine 10 MG tablet Commonly known as: NAMENDA Take 1 tablet (10 mg total) by mouth 2 (two) times daily.   metFORMIN 1000 MG tablet Commonly known as: GLUCOPHAGE Take 1 tablet (1,000 mg total) by mouth 2 (two) times daily with a meal.   metoprolol tartrate 25 MG tablet Commonly known as: LOPRESSOR Take 1 tablet (25 mg total) by mouth 2 (two) times daily.   tamsulosin 0.4 MG Caps capsule Commonly known as: FLOMAX Take 1 capsule (0.4 mg total) by mouth at  bedtime.               Durable Medical Equipment  (From admission, onward)           Start     Ordered   07/25/22 1041  For home use only DME oxygen  Once       Comments: Use 2l [place] on ambulation  Question Answer Comment  Length of Need Lifetime   Mode or (Route) Nasal cannula   Liters per Minute 2   Frequency Continuous (stationary and portable oxygen unit needed)   Oxygen delivery system Gas      07/25/22 1040            Follow-up Information     Laurann Montana, MD Follow up in 1 week(s).   Specialty: Family Medicine Contact information: [phone] [address] [place] [address] [phone]         Leonie Douglas, MD. Call in 1 week(s).   Specialties: Vascular Surgery, Interventional Cardiology Contact information: [address] [place] [address] [phone]         Leslye Peer, MD Follow up in 1 week(s).   Specialty: Pulmonary Disease Contact information: [address] [place] [address] [phone]                Allergies  Allergen Reactions   Ace Inhibitors Swelling    Benazepril   Allopurinol Rash    The results of significant diagnostics from this hospitalization (including imaging, microbiology, ancillary and  laboratory) are listed below for reference.    Microbiology: Recent Results (from the past 240 hour(s))  Resp panel by RT-PCR (RSV, Flu A&B, Covid) Anterior Nasal Swab     Status: None   Collection Time: 07/23/22  3:48 AM   Specimen: Anterior Nasal Swab  Result Value Ref Range Status   SARS Coronavirus 2 by RT PCR NEGATIVE NEGATIVE Final   Influenza A by PCR NEGATIVE NEGATIVE Final   Influenza B by PCR NEGATIVE NEGATIVE Final    Comment: (NOTE) The Xpert Xpress SARS-CoV-2/FLU/RSV plus assay is intended as an aid in the diagnosis of influenza from Nasopharyngeal swab specimens and should not be used as a sole basis for treatment. Nasal washings and aspirates are unacceptable for Xpert Xpress SARS-CoV-2/FLU/RSV testing.  Fact Sheet for Patients: BloggerCourse.com  Fact Sheet for Healthcare Providers: SeriousBroker.it  This test is not yet approved or cleared by the [place] FDA and has been authorized for detection and/or diagnosis of SARS-CoV-2 by FDA under an Emergency Use Authorization (EUA). This EUA will remain in effect (meaning this test can be used) for the duration of the COVID-19 declaration under Section 564(b)(1) of the Act, 21 U.S.C. section 360bbb-3(b)(1), unless the authorization is terminated or revoked.     Resp Syncytial Virus by PCR NEGATIVE NEGATIVE Final    Comment: (NOTE) Fact Sheet for Patients: BloggerCourse.com  Fact Sheet for Healthcare Providers: SeriousBroker.it  This test is not yet approved or cleared by the [place] FDA and has been authorized for detection and/or diagnosis of SARS-CoV-2 by FDA under an Emergency Use Authorization (EUA). This EUA will remain in effect (meaning this test can be used) for the duration of the COVID-19 declaration under Section 564(b)(1) of the Act, 21 U.S.C. section 360bbb-3(b)(1), unless the authorization  is terminated or revoked.  Performed at [facility], [address] [place], [place] [address]     Procedures/Studies: ECHOCARDIOGRAM COMPLETE  Result Date: 07/25/2022  ECHOCARDIOGRAM REPORT   Patient Name:   [name] Date of Exam: 07/24/2022 Medical Rec #:  [id number]     Height:       72.0 in Accession #:    [id number]    Weight:       188.3 lb Date of Birth:  29-Jun-1940      BSA:          2.077 m Patient Age:    81 years      BP:           113/53 mmHg Patient Gender: M             HR:           69 bpm. Exam Location:  Inpatient Procedure: 2D Echo, Color Doppler, Cardiac Doppler and 3D Echo Indications:    Congestive Heart Failure I50.9  History:        Patient has no prior history of Echocardiogram examinations.                 Cancer, Signs/Symptoms:Altered Mental Status; Risk                 Factors:Diabetes, Dyslipidemia, Sleep Apnea and Hypertension.  Sonographer:    Aron Baba Referring Phys: [id number] Aniza Shor  Sonographer Comments: Image acquisition challenging due to respiratory motion. IMPRESSIONS  1. Left ventricular ejection fraction, by estimation, is 50 to 55%. Left ventricular ejection fraction by 3D volume is 51 %. The left ventricle has low normal function. The left ventricle has no regional wall motion abnormalities. There is mild concentric left ventricular hypertrophy. Left ventricular diastolic parameters are consistent with Grade I diastolic dysfunction (impaired relaxation).  2. Right ventricular systolic function is normal. The right ventricular size is normal. There is normal pulmonary artery systolic pressure.  3. Left atrial size was severely dilated.  4. The mitral valve is myxomatous. No evidence of mitral valve regurgitation. No evidence of mitral stenosis.  5. The aortic valve is tricuspid. There is mild calcification of the aortic valve. There is mild thickening of the aortic valve. Aortic valve regurgitation is mild. No aortic stenosis is present.  6. The  inferior vena cava is normal in size with greater than 50% respiratory variability, suggesting right atrial pressure of 3 mmHg. FINDINGS  Left Ventricle: Left ventricular ejection fraction, by estimation, is 50 to 55%. Left ventricular ejection fraction by 3D volume is 51 %. The left ventricle has low normal function. The left ventricle has no regional wall motion abnormalities. The left ventricular internal cavity size was normal in size. There is mild concentric left ventricular hypertrophy. Left ventricular diastolic parameters are consistent with Grade I diastolic dysfunction (impaired relaxation). Right Ventricle: The right ventricular size is normal. No increase in right ventricular wall thickness. Right ventricular systolic function is normal. There is normal pulmonary artery systolic pressure. The tricuspid regurgitant velocity is 2.50 m/s, and  with an assumed right atrial pressure of 3 mmHg, the estimated right ventricular systolic pressure is 28.0 mmHg. Left Atrium: Left atrial size was severely dilated. Right Atrium: Right atrial size was normal in size. Pericardium: There is no evidence of pericardial effusion. Mitral Valve: The mitral valve is myxomatous. No evidence of mitral valve regurgitation. No evidence of mitral valve stenosis. Tricuspid Valve: The tricuspid valve is normal in structure. Tricuspid valve regurgitation is trivial. No evidence of tricuspid stenosis. Aortic Valve: The aortic valve is tricuspid. There is mild calcification of the aortic valve. There is mild thickening of  the aortic valve. Aortic valve regurgitation is mild. Aortic regurgitation PHT measures 539 msec. No aortic stenosis is present. Pulmonic Valve: The pulmonic valve was normal in structure. Pulmonic valve regurgitation is mild. No evidence of pulmonic stenosis. Aorta: The aortic root is normal in size and structure. Venous: The inferior vena cava is normal in size with greater than 50% respiratory variability,  suggesting right atrial pressure of 3 mmHg. IAS/Shunts: No atrial level shunt detected by color flow Doppler.  LEFT VENTRICLE PLAX 2D LVIDd:         3.40 cm         Diastology LVIDs:         2.50 cm         LV e' medial:  6.64 cm/s LV PW:         1.04 cm         LV e' lateral: 10.70 cm/s LV IVS:        1.20 cm LVOT diam:     2.40 cm LV SV:         89              3D Volume EF LV SV Index:   43              LV 3D EF:    Left LVOT Area:     4.52 cm                     ventricul                                             ar                                             ejection                                             fraction                                             by 3D                                             volume is                                             51 %.                                 3D Volume EF:                                3D EF:  51 %                                LV EDV:       137 ml                                LV ESV:       67 ml                                LV SV:        69 ml RIGHT VENTRICLE RV S prime:     14.70 cm/s TAPSE (M-mode): 1.9 cm LEFT ATRIUM           Index        RIGHT ATRIUM           Index LA diam:      3.30 cm 1.59 cm/m   RA Area:     18.60 cm LA Vol (A2C): 74.8 ml 36.02 ml/m  RA Volume:   55.40 ml  26.68 ml/m LA Vol (A4C): 88.5 ml 42.62 ml/m  AORTIC VALVE             PULMONIC VALVE LVOT Vmax:   64.70 cm/s  PR End Diast Vel: 10.37 msec LVOT Vmean:  49.600 cm/s LVOT VTI:    0.196 m AI PHT:      539 msec  AORTA Ao Root diam: 3.80 cm Ao Asc diam:  3.50 cm MITRAL VALVE               TRICUSPID VALVE MV Area (PHT): 3.27 cm    TR Peak grad:   25.0 mmHg MV Decel Time: 232 msec    TR Vmax:        250.00 cm/s MR Peak grad: 0.9 mmHg MR Vmax:      48.60 cm/s   SHUNTS MV A velocity: 83.70 cm/s  Systemic VTI:  0.20 m                            Systemic Diam: 2.40 cm Chilton Siiffany Alcalde MD Electronically signed by Chilton Siiffany LaBelle MD Signature Date/Time:  07/25/2022/7:45:18 AM    Final    VAS US MESENTERIC  Result Date: 07/24/2022 ABDOMINAL VISCERAL Patient Name:  [name]  Date of Exam:   07/24/2022 Medical Rec #: [id number]      Accession #:    [id number] Date of Birth: 02/10/1941       Patient Gender: M Patient Age:   89 years [facility] Procedure:      VAS US MESENTERIC Referring Phys: [id number] RONDELL A SMITH -------------------------------------------------------------------------------- Indications: Abdominal pain High Risk Factors: Hypertension, hyperlipidemia, Diabetes. Limitations: Air/bowel gas and Technically limited study due to overlying bowel gas and difficulty imaging the vessels. Comparison Study: No priors. Performing Technologist: Marilynne Halstedita Sturdivant RDMS, RVT  Examination Guidelines: A complete evaluation includes B-mode imaging, spectral Doppler, color Doppler, and power Doppler as needed of all accessible portions of each vessel. Bilateral testing is considered an integral part of a complete examination. Limited examinations for reoccurring indications may be performed as noted.  Duplex Findings: +----------------------+--------+--------+------+----------------------+ Mesenteric            PSV cm/sEDV cm/sPlaque       Comments        +----------------------+--------+--------+------+----------------------+  Aorta Prox               71                                        +----------------------+--------+--------+------+----------------------+ Aorta Mid                59                                        +----------------------+--------+--------+------+----------------------+ Celiac Artery Origin                         not well visualized   +----------------------+--------+--------+------+----------------------+ Celiac Artery Proximal  188                                        +----------------------+--------+--------+------+----------------------+ SMA Proximal            319                                         +----------------------+--------+--------+------+----------------------+ SMA Mid                 168                                        +----------------------+--------+--------+------+----------------------+ SMA Distal              175                                        +----------------------+--------+--------+------+----------------------+ CHA                     101                                        +----------------------+--------+--------+------+----------------------+ Splenic                                     not clearly visualized +----------------------+--------+--------+------+----------------------+ IMA                                             not visualized     +----------------------+--------+--------+------+----------------------+    Summary: Mesenteric: 70 to 99% stenosis in the superior mesenteric artery. Patent superior mesenteric artery with elevated velocity suggestive of 70-99% stenosis. Celiac artery and inferior mesenteric artery were not clearly visualized.  *See table(s) above for measurements and observations.  Diagnosing physician: Sherald Hess MD  Electronically signed by Sherald Hess MD on 07/24/2022 at 2:18:26 PM.    Final    CT ABDOMEN PELVIS W CONTRAST  Result Date: 07/23/2022 CLINICAL DATA:  Abdominal pain,  acute, nonlocalized; Pulmonary embolism (PE) suspected, high prob, peripheral edema. History of prostate cancer and bladder cancer EXAM: CT ANGIOGRAPHY CHEST CT ABDOMEN AND PELVIS WITH CONTRAST TECHNIQUE: Multidetector CT imaging of the chest was performed using the standard protocol during bolus administration of intravenous contrast. Multiplanar CT image reconstructions and MIPs were obtained to evaluate the vascular anatomy. Multidetector CT imaging of the abdomen and pelvis was performed using the standard protocol during bolus administration of intravenous contrast. RADIATION DOSE  REDUCTION: This exam was performed according to the departmental dose-optimization program which includes automated exposure control, adjustment of the mA and/or kV according to patient size and/or use of iterative reconstruction technique. CONTRAST:  75mL OMNIPAQUE IOHEXOL 350 MG/ML SOLN COMPARISON:  CT chest 10/12/2006, CT abdomen pelvis 06/10/2019 FINDINGS: CTA CHEST FINDINGS Cardiovascular: There is adequate opacification of the pulmonary arterial tree. No intraluminal filling defect identified to suggest acute pulmonary embolism. The central pulmonary arteries are of normal caliber. Extensive multi-vessel coronary artery calcification. Global cardiac size is within normal limits. No pericardial effusion. Extensive atherosclerotic calcification within the thoracic aorta. No aortic aneurysm. Mediastinum/Nodes: Pathologic mediastinal and hilar adenopathy is again identified and appears slightly progressive since prior examination. Visualized thyroid is unremarkable. Esophagus is unremarkable. Lungs/Pleura: Mild paraseptal emphysema. Mild subpleural and bibasilar predominant pulmonary fibrotic change. 11 mm noncalcified pulmonary nodule within the right upper lobe anteriorly at axial image # 44/6 is stable when accounting for changes in slice selection and is safely considered benign. No follow-up imaging is recommended for this lesion. No superimposed focal pulmonary infiltrate. No pneumothorax or pleural effusion. No central obstructing lesion. Musculoskeletal: No acute bone abnormality. No lytic or blastic bone lesion Review of the MIP images confirms the above findings. CT ABDOMEN and PELVIS FINDINGS Hepatobiliary: Mild intra and extrahepatic biliary ductal dilation is stable. Liver otherwise unremarkable. Gallbladder unremarkable. Pancreas: Borderline dilation of the pancreatic duct within the pancreatic head is stable. Pancreas is otherwise unremarkable; no peripancreatic inflammatory changes are identified  and there is normal enhancement of the pancreatic parenchyma. Spleen: Unremarkable Adrenals/Urinary Tract: The adrenal glands are unremarkable. The kidneys are normal in size and position. Multiple bilateral simple parapelvic cysts are identified for which no follow-up imaging is recommended. The kidneys are otherwise unremarkable. The bladder is unremarkable. Stomach/Bowel: Mild distal colonic diverticulosis without superimposed acute inflammatory change. The stomach, small bowel, and large bowel are otherwise unremarkable. Appendix normal. Vascular/Lymphatic: Extensive aortoiliac atherosclerotic calcification. High-grade stenosis of the celiac axis noted. Particularly prominent atherosclerotic calcification is noted at the origin of the superior mesenteric artery and inferior mesenteric artery as well as the renal arteries bilaterally, however, the degree of stenosis is not well assessed on this examination. Enlarged periportal lymph node demonstrates slight interval increase in size now measuring 18 mm in short axis diameter. No additional pathologic adenopathy within the abdomen and pelvis. Reproductive: Fiducial markers in place along the inferior gland. Prostate gland is of normal size. Other: Small bilateral direct inguinal hernias are again noted. Musculoskeletal: No acute bone abnormality. No lytic or blastic bone lesion. Review of the MIP images confirms the above findings. IMPRESSION: 1. No pulmonary embolism. No acute intrathoracic or intra-abdominal pathology identified. 2. Extensive multi-vessel coronary artery calcification. 3. Mild paraseptal emphysema. 4. Mild subpleural and bibasilar predominant pulmonary fibrotic change. 5. Slight interval progression of pathologic mediastinal and hilar adenopathy. Slight interval increase in size of solitary enlarged periportal lymph node. Findings are suspicious for mildly progressive metastatic disease or a chronic lymphoproliferative process. 6. Mild distal  colonic  diverticulosis without superimposed acute inflammatory change. 7. High-grade stenosis of the celiac axis. Particularly prominent atherosclerotic calcification at the origin of the superior mesenteric artery and inferior mesenteric artery as well as the renal arteries bilaterally, however, the degree of stenosis is not well assessed on this examination. If there is clinical evidence of chronic mesenteric ischemia or hemodynamically significant renal artery stenosis, CT arteriography may be more helpful for further evaluation. 8. Small bilateral direct inguinal hernias. Aortic Atherosclerosis (ICD10-I70.0) and Emphysema (ICD10-J43.9). Electronically Signed   By: Helyn Numbers M.D.   On: 07/23/2022 03:28   CT Angio Chest PE W/Cm &/Or Wo Cm  Result Date: 07/23/2022 CLINICAL DATA:  Abdominal pain, acute, nonlocalized; Pulmonary embolism (PE) suspected, high prob, peripheral edema. History of prostate cancer and bladder cancer EXAM: CT ANGIOGRAPHY CHEST CT ABDOMEN AND PELVIS WITH CONTRAST TECHNIQUE: Multidetector CT imaging of the chest was performed using the standard protocol during bolus administration of intravenous contrast. Multiplanar CT image reconstructions and MIPs were obtained to evaluate the vascular anatomy. Multidetector CT imaging of the abdomen and pelvis was performed using the standard protocol during bolus administration of intravenous contrast. RADIATION DOSE REDUCTION: This exam was performed according to the departmental dose-optimization program which includes automated exposure control, adjustment of the mA and/or kV according to patient size and/or use of iterative reconstruction technique. CONTRAST:  75mL OMNIPAQUE IOHEXOL 350 MG/ML SOLN COMPARISON:  CT chest 10/12/2006, CT abdomen pelvis 06/10/2019 FINDINGS: CTA CHEST FINDINGS Cardiovascular: There is adequate opacification of the pulmonary arterial tree. No intraluminal filling defect identified to suggest acute pulmonary embolism.  The central pulmonary arteries are of normal caliber. Extensive multi-vessel coronary artery calcification. Global cardiac size is within normal limits. No pericardial effusion. Extensive atherosclerotic calcification within the thoracic aorta. No aortic aneurysm. Mediastinum/Nodes: Pathologic mediastinal and hilar adenopathy is again identified and appears slightly progressive since prior examination. Visualized thyroid is unremarkable. Esophagus is unremarkable. Lungs/Pleura: Mild paraseptal emphysema. Mild subpleural and bibasilar predominant pulmonary fibrotic change. 11 mm noncalcified pulmonary nodule within the right upper lobe anteriorly at axial image # 44/6 is stable when accounting for changes in slice selection and is safely considered benign. No follow-up imaging is recommended for this lesion. No superimposed focal pulmonary infiltrate. No pneumothorax or pleural effusion. No central obstructing lesion. Musculoskeletal: No acute bone abnormality. No lytic or blastic bone lesion Review of the MIP images confirms the above findings. CT ABDOMEN and PELVIS FINDINGS Hepatobiliary: Mild intra and extrahepatic biliary ductal dilation is stable. Liver otherwise unremarkable. Gallbladder unremarkable. Pancreas: Borderline dilation of the pancreatic duct within the pancreatic head is stable. Pancreas is otherwise unremarkable; no peripancreatic inflammatory changes are identified and there is normal enhancement of the pancreatic parenchyma. Spleen: Unremarkable Adrenals/Urinary Tract: The adrenal glands are unremarkable. The kidneys are normal in size and position. Multiple bilateral simple parapelvic cysts are identified for which no follow-up imaging is recommended. The kidneys are otherwise unremarkable. The bladder is unremarkable. Stomach/Bowel: Mild distal colonic diverticulosis without superimposed acute inflammatory change. The stomach, small bowel, and large bowel are otherwise unremarkable. Appendix  normal. Vascular/Lymphatic: Extensive aortoiliac atherosclerotic calcification. High-grade stenosis of the celiac axis noted. Particularly prominent atherosclerotic calcification is noted at the origin of the superior mesenteric artery and inferior mesenteric artery as well as the renal arteries bilaterally, however, the degree of stenosis is not well assessed on this examination. Enlarged periportal lymph node demonstrates slight interval increase in size now measuring 18 mm in short axis diameter. No additional pathologic adenopathy within the  abdomen and pelvis. Reproductive: Fiducial markers in place along the inferior gland. Prostate gland is of normal size. Other: Small bilateral direct inguinal hernias are again noted. Musculoskeletal: No acute bone abnormality. No lytic or blastic bone lesion. Review of the MIP images confirms the above findings. IMPRESSION: 1. No pulmonary embolism. No acute intrathoracic or intra-abdominal pathology identified. 2. Extensive multi-vessel coronary artery calcification. 3. Mild paraseptal emphysema. 4. Mild subpleural and bibasilar predominant pulmonary fibrotic change. 5. Slight interval progression of pathologic mediastinal and hilar adenopathy. Slight interval increase in size of solitary enlarged periportal lymph node. Findings are suspicious for mildly progressive metastatic disease or a chronic lymphoproliferative process. 6. Mild distal colonic diverticulosis without superimposed acute inflammatory change. 7. High-grade stenosis of the celiac axis. Particularly prominent atherosclerotic calcification at the origin of the superior mesenteric artery and inferior mesenteric artery as well as the renal arteries bilaterally, however, the degree of stenosis is not well assessed on this examination. If there is clinical evidence of chronic mesenteric ischemia or hemodynamically significant renal artery stenosis, CT arteriography may be more helpful for further evaluation. 8.  Small bilateral direct inguinal hernias. Aortic Atherosclerosis (ICD10-I70.0) and Emphysema (ICD10-J43.9). Electronically Signed   By: Helyn Numbers M.D.   On: 07/23/2022 03:28   DG Chest 2 View  Result Date: 07/22/2022 CLINICAL DATA:  Shortness of breath. Bilateral lower extremity swelling. EXAM: CHEST - 2 VIEW COMPARISON:  Chest radiograph dated 12/10/2021. FINDINGS: Shallow inspiration with bibasilar atelectasis. There is mild vascular congestion. No focal consolidation, pleural effusion, or pneumothorax. Stable cardiac silhouette. Atherosclerotic calcification of the aorta. No acute osseous pathology. IMPRESSION: Mild vascular congestion. No focal consolidation. Electronically Signed   By: Elgie Collard M.D.   On: 07/22/2022 21:13    Labs: BNP (last 3 results) Recent Labs    07/22/22 2050  BNP 125.9*   Basic Metabolic Panel: Recent Labs  Lab 07/22/22 2050 07/23/22 0941 07/24/22 0320 07/25/22 0215  NA 140 142 138 138  K 3.9 3.2* 3.6 3.7  CL 105 103 104 103  CO2 22 28 28 26   GLUCOSE 127* 115* 125* 174*  BUN 28* 16 18 17   CREATININE 1.66* 1.32* 1.29* 1.33*  CALCIUM 8.8* 9.0 8.7* 8.5*   Liver Function Tests: Recent Labs  Lab 07/22/22 2050  AST 23  ALT 12  ALKPHOS 59  BILITOT 0.5  PROT 7.3  ALBUMIN 3.0*   No results for input(s): "LIPASE", "AMYLASE" in the last 168 hours. No results for input(s): "AMMONIA" in the last 168 hours. CBC: Recent Labs  Lab 07/22/22 2050 07/24/22 0320 07/25/22 0215  WBC 7.0 7.1 7.5  NEUTROABS 3.6  --   --   HGB 12.0* 11.9* 11.8*  HCT 36.5* 37.1* 35.7*  MCV 75.6* 76.0* 74.7*  PLT 201 200 180   Cardiac Enzymes: No results for input(s): "CKTOTAL", "CKMB", "CKMBINDEX", "TROPONINI" in the last 168 hours. BNP: Invalid input(s): "POCBNP" CBG: Recent Labs  Lab 07/24/22 1126 07/24/22 1518 07/24/22 2110 07/25/22 0752 07/25/22 1115  GLUCAP 112* 122* 279* 141* 267*   D-Dimer No results for input(s): "DDIMER" in the last 72  hours. Hgb A1c Recent Labs    07/23/22 0941  HGBA1C 7.8*   Lipid Profile Recent Labs    07/23/22 0941  CHOL 96  HDL 30*  LDLCALC 54  TRIG 59  CHOLHDL 3.2   Thyroid function studies Recent Labs    07/23/22 0941  TSH 4.078   Anemia work up Recent Labs    07/23/22 0941  FERRITIN 42  TIBC 372  IRON 33*   Urinalysis    Component Value Date/Time   COLORURINE YELLOW 07/22/2022 2125   APPEARANCEUR CLEAR 07/22/2022 2125   LABSPEC 1.015 07/22/2022 2125   PHURINE 5.0 07/22/2022 2125   GLUCOSEU NEGATIVE 07/22/2022 2125   GLUCOSEU NEGATIVE 07/03/2014 1011   HGBUR NEGATIVE 07/22/2022 2125   BILIRUBINUR NEGATIVE 07/22/2022 2125   KETONESUR NEGATIVE 07/22/2022 2125   PROTEINUR NEGATIVE 07/22/2022 2125   UROBILINOGEN 0.2 04/28/2021 1302   NITRITE NEGATIVE 07/22/2022 2125   LEUKOCYTESUR NEGATIVE 07/22/2022 2125   Sepsis Labs Recent Labs  Lab 07/22/22 2050 07/24/22 0320 07/25/22 0215  WBC 7.0 7.1 7.5   Microbiology Recent Results (from the past 240 hour(s))  Resp panel by RT-PCR (RSV, Flu A&B, Covid) Anterior Nasal Swab     Status: None   Collection Time: 07/23/22  3:48 AM   Specimen: Anterior Nasal Swab  Result Value Ref Range Status   SARS Coronavirus 2 by RT PCR NEGATIVE NEGATIVE Final   Influenza A by PCR NEGATIVE NEGATIVE Final   Influenza B by PCR NEGATIVE NEGATIVE Final    Comment: (NOTE) The Xpert Xpress SARS-CoV-2/FLU/RSV plus assay is intended as an aid in the diagnosis of influenza from Nasopharyngeal swab specimens and should not be used as a sole basis for treatment. Nasal washings and aspirates are unacceptable for Xpert Xpress SARS-CoV-2/FLU/RSV testing.  Fact Sheet for Patients: BloggerCourse.com  Fact Sheet for Healthcare Providers: SeriousBroker.it  This test is not yet approved or cleared by the [place] FDA and has been authorized for detection and/or diagnosis of SARS-CoV-2 by FDA  under an Emergency Use Authorization (EUA). This EUA will remain in effect (meaning this test can be used) for the duration of the COVID-19 declaration under Section 564(b)(1) of the Act, 21 U.S.C. section 360bbb-3(b)(1), unless the authorization is terminated or revoked.     Resp Syncytial Virus by PCR NEGATIVE NEGATIVE Final    Comment: (NOTE) Fact Sheet for Patients: BloggerCourse.com  Fact Sheet for Healthcare Providers: SeriousBroker.it  This test is not yet approved or cleared by the [place] FDA and has been authorized for detection and/or diagnosis of SARS-CoV-2 by FDA under an Emergency Use Authorization (EUA). This EUA will remain in effect (meaning this test can be used) for the duration of the COVID-19 declaration under Section 564(b)(1) of the Act, 21 U.S.C. section 360bbb-3(b)(1), unless the authorization is terminated or revoked.  Performed at [facility], [address], [place], [place] [address]      Time coordinating discharge: 25 minutes  SIGNED: Lanae Boast, MD  [facility] 07/25/2022, 1:03 PM  If 7PM-7AM, please contact night-coverage [email or web address]

## 2022-07-25 NOTE — TOC Transition Note (Signed)
Transition of Care [facility]) - CM/SW Discharge Note   Patient Details  Name: [name] MRN: [id number] Date of Birth: November 14, 1940  [facility] CM/SW Contact:  Gordy Clement, RN Phone Number: 07/25/2022, 1:15 PM   Clinical Narrative:     Patient will DC to home with Home O2- [employer] to provide . No additional TOC needs identified           Patient Goals and CMS Choice      Discharge Placement                         Discharge Plan and Services Additional resources added to the After Visit Summary for                                       Social Determinants of Health (SDOH) Interventions SDOH Screenings   Food Insecurity: No Food Insecurity (04/26/2021)  Housing: Low Risk  (08/25/2019)  Transportation Needs: No Transportation Needs (04/26/2021)  Depression (PHQ2-9): Low Risk  (04/26/2021)  Tobacco Use: Medium Risk (07/22/2022)     Readmission Risk Interventions     No data to display

## 2022-07-25 NOTE — Consult Note (Signed)
NAME:  [name], MRN:  [id number], DOB:  Apr 01, 1941, LOS: 2 ADMISSION DATE:  07/22/2022, CONSULTATION DATE:  4/5 REFERRING MD:  KC, CHIEF COMPLAINT:  hypoxia    History of Present Illness:  This is a 82 year old male w/ Hx as outlined below (previously followed by [facility]). Known h/o OSA, CT chest w/ LAN, subpleural nodules and interstitial changes suggestive of sarcoid. Last seen Nov 2023. Not able to complete PFTs, Normal CXR.  Presented to the ER 4/2 w/ cc: LE swelling.   Of note recently seen and treated for UTI w/ associated complaints of N/V/D and abd pain but these complaints resolved prior to his presentation to the ER.   In ER: sats 86% rome air  BNP 125.9 negative trop. Cxr w/ mild congestion, covid and influenza screening were negative. CT chest obtained was negative for PE, but did show progression of mediastinal and hilar adenopathy.Mild paraseptal emphysema.   Mild subpleural and bibasilar predominant pulmonary fibrotic change. Seen by Vascular surgery for possible mesenteric ischemia  (also a concern raised by the CT chest) w/ dx findings raising concern for 70-99% occlusion or SMA, vascular recommended against intervention.,  On 4/4 he was still hypoxic so discharge was held. On 4/5 he had walking oximetry w/ room air desaturation to 83% w/ ambulation which was the reason for our consult   Pertinent  Medical History  Sarcoidosis, T2DM, Chronic diastolic HF, prostate cancer s/p radiation rx, vascular dementia, remote tobacco abuse, prior TKR, glaucoma, bladder cancer, OSA   Significant Hospital Events: Including procedures, antibiotic start and stop dates in addition to other pertinent events   4/3 admitted w/ N/V/cough. Found to be hypoxic. CT chest obtained how progression of mediastinal and hilar adenopathy. Also raised concern for high grade stenosis of SMA. Got lasix for hypoxia. Was seen by vascular.  4/4 still w/ exertional hypoxia. ECHO:  1. Left ventricular ejection  fraction, by estimation, is 50 to 55%. Left ventricular ejection fraction by 3D volume is 51 %. The left ventricle has low normal function. The left ventricle has no regional wall motion abnormalities. There is mild  concentric left ventricular hypertrophy. Left ventricular diastolic  parameters are consistent with Grade I diastolic dysfunction (impaired  relaxation).   Interim History / Subjective:  No current complaints  Objective   Blood pressure 133/61, pulse 78, temperature 98.5 F (36.9 C), resp. rate 18, height 6' (1.829 m), weight 85.4 kg, SpO2 91 %.        Intake/Output Summary (Last 24 hours) at 07/25/2022 1049 Last data filed at 07/25/2022 0630 Gross per 24 hour  Intake 480 ml  Output 50 ml  Net 430 ml   Filed Weights   07/22/22 2053 07/24/22 0500  Weight: 86.2 kg 85.4 kg    Examination: General: 82 year old male resting in bed.  HENT: NCAT no JVD  Lungs: decreased bases Cardiovascular: RRR Abdomen: soft  Extremities: trace LE edema  Neuro: awake, oriented. Recall is poor GU: voids   Resolved Hospital Problem list    Assessment & Plan:  Hypoxic respiratory failure (likely chronic)  Possible sarcoidosis  Progressive mediastinal and hilar adenopathy  Chronic interstitial fibrotic changes.  Grade I diastolic HF   Chronic hypercarbic respiratory failure in setting of pulmonary fibrotic changes from presumed sarcoidosis. Would favor his hypoxia is explained by this w/ associated restrictive physiology  Plan/rec  Home on oxygen PRN SABA Lasix PRN   Progressive mediastinal and hilar adenopathy Plan We can f/u outpt but  given functional status don't think good candidate for diagnostics and would be unlikely to tolerate any treatments even if this did reflect a malignancy  Best Practice (right click and "Reselect all SmartList Selections" daily)   Per primary   Labs   CBC: Recent Labs  Lab 07/22/22 2050 07/24/22 0320 07/25/22 0215  WBC 7.0 7.1 7.5   NEUTROABS 3.6  --   --   HGB 12.0* 11.9* 11.8*  HCT 36.5* 37.1* 35.7*  MCV 75.6* 76.0* 74.7*  PLT 201 200 180    Basic Metabolic Panel: Recent Labs  Lab 07/22/22 2050 07/23/22 0941 07/24/22 0320 07/25/22 0215  NA 140 142 138 138  K 3.9 3.2* 3.6 3.7  CL 105 103 104 103  CO2 22 28 28 26   GLUCOSE 127* 115* 125* 174*  BUN 28* 16 18 17   CREATININE 1.66* 1.32* 1.29* 1.33*  CALCIUM 8.8* 9.0 8.7* 8.5*   GFR: Estimated Creatinine Clearance: 49.4 mL/min (A) (by C-G formula based on SCr of 1.33 mg/dL (H)). Recent Labs  Lab 07/22/22 2050 07/23/22 0941 07/23/22 1211 07/24/22 0320 07/25/22 0215  WBC 7.0  --   --  7.1 7.5  LATICACIDVEN  --  0.8 0.8  --   --     Liver Function Tests: Recent Labs  Lab 07/22/22 2050  AST 23  ALT 12  ALKPHOS 59  BILITOT 0.5  PROT 7.3  ALBUMIN 3.0*   No results for input(s): "LIPASE", "AMYLASE" in the last 168 hours. No results for input(s): "AMMONIA" in the last 168 hours.  ABG    Component Value Date/Time   PHART 7.45 07/24/2022 1644   PCO2ART 44 07/24/2022 1644   PO2ART 63 (L) 07/24/2022 1644   HCO3 30.6 (H) 07/24/2022 1644   TCO2 22 12/29/2010 1305   O2SAT 93.4 07/24/2022 1644     Coagulation Profile: No results for input(s): "INR", "PROTIME" in the last 168 hours.  Cardiac Enzymes: No results for input(s): "CKTOTAL", "CKMB", "CKMBINDEX", "TROPONINI" in the last 168 hours.  HbA1C: Hgb A1c MFr Bld  Date/Time Value Ref Range Status  07/23/2022 09:41 AM 7.8 (H) 4.8 - 5.6 % Final    Comment:    (NOTE)         Prediabetes: 5.7 - 6.4         Diabetes: >6.4         Glycemic control for adults with diabetes: <7.0   10/26/2017 10:39 PM 10.0 (H) 4.8 - 5.6 % Final    Comment:    (NOTE) Pre diabetes:          5.7%-6.4% Diabetes:              >6.4% Glycemic control for   <7.0% adults with diabetes     CBG: Recent Labs  Lab 07/24/22 0726 07/24/22 1126 07/24/22 1518 07/24/22 2110 07/25/22 0752  GLUCAP 118* 112* 122*  279* 141*    Review of Systems:   Review of Systems  Constitutional:  Positive for malaise/fatigue. Negative for chills and fever.       Wife notes really minimal exertion Decreased activity tolerance for at least 1 year  Basically sleeps most of day Does use his CPAP at night.  Walks short distances in house but rarely leaves the house    HENT: Negative.    Eyes: Negative.   Respiratory:  Positive for shortness of breath.   Cardiovascular:  Positive for leg swelling. Negative for chest pain.  Gastrointestinal: Negative.   Genitourinary: Negative.  Musculoskeletal: Negative.   Skin: Negative.      Past Medical History:  He,  has a past medical history of Bladder cancer, Cataracts, bilateral, Diabetes mellitus, Gout, Hearing loss, Heart disease, unspecified, Hypercholesterolemia, Hypertension, Memory changes, Prostate cancer, Sarcoidosis, and Unspecified glaucoma(365.9).   Surgical History:   Past Surgical History:  Procedure Laterality Date   BLADDER SURGERY     for carcinoma   CATARACT EXTRACTION Bilateral 2011   knee replacement surgery  1999   right   PROSTATECTOMY  2013     Social History:   reports that he quit smoking about 17 years ago. His smoking use included cigarettes. He smoked an average of .5 packs per day. He has never used smokeless tobacco. He reports that he does not drink alcohol and does not use drugs.   Family History:  His family history includes Coronary artery disease in his brother and father; Diabetes Mellitus I in his brother, brother, and mother; Hypertension in his brother, father, mother, and sister. There is no history of Cancer.   Allergies Allergies  Allergen Reactions   Ace Inhibitors Swelling    Benazepril   Allopurinol Rash     Home Medications  Prior to Admission medications   Medication Sig Start Date End Date Taking? Authorizing Provider  amLODipine (NORVASC) 10 MG tablet Take 1 tablet (10 mg total) by mouth daily. 10/30/17   Yes [name], [name], MD  aspirin 81 MG tablet Take 81 mg by mouth at bedtime.    Yes [provider]  atorvastatin (LIPITOR) 20 MG tablet Take 20 mg by mouth daily. 12/16/18  Yes [provider]  benzonatate (TESSALON) 100 MG capsule Take 1 capsule by mouth every 8 (eight) hours for cough. Patient taking differently: Take 100 mg by mouth daily as needed for cough. 10/01/21  Yes Mardella Layman, MD  Blood Glucose Monitoring Suppl (ACCU-CHEK AVIVA PLUS) w/Device KIT Use to check blood sugar 3 times per day dx code E11.65 03/12/16  Yes Reather Littler, MD  carbidopa-levodopa (SINEMET IR) 25-100 MG tablet Take 1 pill in the morning, 1 pill in the afternoon, and 2 pills at night Patient taking differently: Take 1-2 tablets by mouth See admin instructions. Take 1 tablet by mouth in the morning, 1 tablet in the afternoon and 2 tablets at night 03/20/22  Yes Chima, Victorino Dike, MD  donepezil (ARICEPT) 10 MG tablet Take 1 tablet (10 mg total) by mouth at bedtime. 03/20/22  Yes Ocie Doyne, MD  dorzolamide-timolol (COSOPT) 22.3-6.8 MG/ML ophthalmic solution Place 1 drop into both eyes 2 (two) times daily.    Yes [provider]  escitalopram (LEXAPRO) 10 MG tablet Take 1 tablet (10 mg total) by mouth daily. 03/20/22  Yes Ocie Doyne, MD  ferrous sulfate 325 (65 FE) MG tablet Take 1 tablet (325 mg total) by mouth 2 (two) times daily. 10/30/17  Yes [name], [name], MD  fish oil-omega-3 fatty acids 1000 MG capsule Take 1 g by mouth daily.    Yes [provider]  GEMTESA 75 MG TABS Take 75 mg by mouth daily. 09/03/21  Yes [provider]  hydroxypropyl methylcellulose / hypromellose (ISOPTO TEARS / GONIOVISC) 2.5 % ophthalmic solution Place 1 drop into both eyes 4 (four) times daily as needed for dry eyes.    Yes [provider]  indomethacin (INDOCIN) 50 MG capsule Take 1 capsule (50 mg total) by mouth 2 (two) times daily as needed (gout). Take with food 12/24/19   Yes Janace Aris, NP  insulin regular (NOVOLIN R RELION) 100 units/mL injection Inject 0.05-0.15 mLs (5-15 Units total) into the skin 3 (three) times daily with meals as needed for high blood sugar (per sliding scale). 10/30/17  Yes [name], [name], MD  latanoprost (XALATAN) 0.005 % ophthalmic solution Place 1 drop into both eyes at bedtime.  02/17/14  Yes [provider]  meclizine (ANTIVERT) 25 MG tablet Take 1 tablet (25 mg total) by mouth 3 (three) times daily as needed for dizziness. 07/05/16  Yes Dione Booze, MD  memantine (NAMENDA) 10 MG tablet Take 1 tablet (10 mg total) by mouth 2 (two) times daily. 03/20/22  Yes Ocie Doyne, MD  metFORMIN (GLUCOPHAGE) 1000 MG tablet Take 1 tablet (1,000 mg total) by mouth 2 (two) times daily with a meal. 10/30/17  Yes [name], [name], MD  metoprolol tartrate (LOPRESSOR) 25 MG tablet Take 1 tablet (25 mg total) by mouth 2 (two) times daily. 10/30/17  Yes [name], [name], MD  Multiple Vitamins-Minerals (CENTRUM SILVER ADULT 50+) TABS Take 1 tablet by mouth daily.   Yes [provider]  Multiple Vitamins-Minerals (PRESERVISION AREDS) TABS Take 1 capsule by mouth in the morning and at bedtime.   Yes [provider]  ACCU-CHEK AVIVA PLUS test strip USE AS INSTRUCTED TO CHECK BLOOD SUGAR THREE TIMES DAILY 11/11/16   Reather Littler, MD  Insulin Syringe-Needle U-100 (INSULIN SYRINGE .5CC/31GX5/16") 31G X 5/16" 0.5 ML MISC Use one to inject insulin daily 05/02/15   Reather Littler, MD  tamsulosin (FLOMAX) 0.4 MG CAPS capsule Take 1 capsule (0.4 mg total) by mouth at bedtime. 10/30/17   Shon Hale, MD     Critical care time: NA    Simonne Martinet ACNP-BC [facility] Pulmonary/Critical Care Pager # [phone] OR # [phone] if no answer

## 2022-07-25 NOTE — Evaluation (Signed)
Physical Therapy Evaluation Patient Details Name: [name] MRN: [id number] DOB: 1941/03/29 Today's Date: 07/25/2022  History of Present Illness  82 y.o. male admitted with abdominal pain. Pt with suspected mesenteric ischemia (workup underway), acute respiratory failure with hypoxia & AKI. Medical history significant of HTN, diastolic CHF, DM2, sarcoidosis, gout, and prostate cancer, vascular dementia, and remote tobacco abuse.  Clinical Impression  Patient presents with pain in bil feet, drop in oxygen saturation and impaired mobility s/p above. Pt lives at home with his spouse and reports using SPC PRN PTA. Pt's wife reports increased time in bed the last month with disinterest in activities. Today, pt seems more alert and awake and engaging in conversation. Requires supervision for standing and gait training with use of RW for support due to pain in feet. Thinks he is having a gout flare up. No sob noted but Sp02 dropped to 83% on RA with activity. Donned 2L and able to maintain Sp02 >91%. Pt able to maintain 02 at rest at 90-91%. Will need supplemental 02 at home. Encouraged use of RW at home until pain in feet improve for safety. Rn notified. Will follow acutely to maximize independence and mobility prior to return home.       Recommendations for follow up therapy are one component of a multi-disciplinary discharge planning process, led by the attending physician.  Recommendations may be updated based on patient status, additional functional criteria and insurance authorization.  Follow Up Recommendations       Assistance Recommended at Discharge PRN  Patient can return home with the following  Help with stairs or ramp for entrance;Assist for transportation;Assistance with cooking/housework;Direct supervision/assist for medications management;Direct supervision/assist for financial management    Equipment Recommendations None recommended by PT  Recommendations for Other Services        Functional Status Assessment Patient has had a recent decline in their functional status and demonstrates the ability to make significant improvements in function in a reasonable and predictable amount of time.     Precautions / Restrictions Precautions Precautions: Fall;Other (comment) Precaution Comments: watch O2 sats Restrictions Weight Bearing Restrictions: No      Mobility  Bed Mobility               General bed mobility comments: Up in chair upon PT arrival.    Transfers Overall transfer level: Needs assistance Equipment used: Rolling walker (2 wheels) Transfers: Sit to/from Stand Sit to Stand: Supervision           General transfer comment: SUpervision for safety. Stood from Orthoptist. Cues for hand placement with RW inf ront of him.    Ambulation/Gait Ambulation/Gait assistance: Supervision Gait Distance (Feet): 150 Feet Assistive device: Rolling walker (2 wheels) Gait Pattern/deviations: Step-through pattern, Decreased stride length Gait velocity: decreased Gait velocity interpretation: 1.31 - 2.62 ft/sec, indicative of limited community ambulator   General Gait Details: SLow, mostly steady gait with decreased foot clearance bilaterally due to pain in bil feet (reports gout flare up). Sp02 dropped to 83% on RA, donned 2L and able to maintain Sp02 >91%. No SOB noted.  Stairs            Wheelchair Mobility    Modified Rankin (Stroke Patients Only)       Balance Overall balance assessment: Mild deficits observed, not formally tested  Pertinent Vitals/Pain Pain Assessment Pain Assessment: Faces Faces Pain Scale: Hurts even more Pain Location: B feet Pain Descriptors / Indicators: Discomfort, Aching Pain Intervention(s): Monitored during session, Repositioned, Patient requesting pain meds-RN notified, Limited activity within patient's tolerance    Home Living Family/patient  expects to be discharged to:: Private residence Living Arrangements: Spouse/significant other Available Help at Discharge: Available 24 hours/day;Family Type of Home: House Home Access: Stairs to enter Entrance Stairs-Rails: None Entrance Stairs-Number of Steps: 1   Home Layout: One level Home Equipment: Agricultural consultant (2 wheels);Shower seat;Other (comment) Additional Comments: walking stick    Prior Function Prior Level of Function : Independent/Modified Independent             Mobility Comments: uses a walking stick at times ADLs Comments: wife reports increased time in bed over the last month; disinterest in activities and decreased appetite and increased time sleeping     Hand Dominance   Dominant Hand: Right    Extremity/Trunk Assessment   Upper Extremity Assessment Upper Extremity Assessment: Defer to OT evaluation    Lower Extremity Assessment Lower Extremity Assessment: Overall WFL for tasks assessed (reports pain in bil feet, wife states gout flare up, RN notified)    Cervical / Trunk Assessment Cervical / Trunk Assessment: Normal  Communication   Communication: HOH  Cognition Arousal/Alertness: Awake/alert Behavior During Therapy: WFL for tasks assessed/performed Overall Cognitive Status: Impaired/Different from baseline Area of Impairment: Orientation, Attention, Memory, Following commands, Problem solving                 Orientation Level: Disoriented to, Time Current Attention Level: Selective Memory: Decreased short-term memory Following Commands: Follows multi-step commands consistently     Problem Solving: Slow processing General Comments: Pt more alert and awake this session. Thinks it is March but able to state April with cues.        General Comments General comments (skin integrity, edema, etc.): Wife present during session. Sp02 on RA 90%. RN notified.    Exercises     Assessment/Plan    PT Assessment Patient needs continued  PT services  PT Problem List Decreased mobility;Pain;Decreased balance;Cardiopulmonary status limiting activity       PT Treatment Interventions Therapeutic activities;Therapeutic exercise;Gait training;Patient/family education;Balance training;Functional mobility training;Stair training;Cognitive remediation    PT Goals (Current goals can be found in the Care Plan section)  Acute Rehab PT Goals Patient Stated Goal: to go home PT Goal Formulation: With patient/family Time For Goal Achievement: 08/08/22 Potential to Achieve Goals: Good    Frequency Min 3X/week     Co-evaluation               AM-PAC PT "6 Clicks" Mobility  Outcome Measure Help needed turning from your back to your side while in a flat bed without using bedrails?: A Little Help needed moving from lying on your back to sitting on the side of a flat bed without using bedrails?: A Little Help needed moving to and from a bed to a chair (including a wheelchair)?: A Little Help needed standing up from a chair using your arms (e.g., wheelchair or bedside chair)?: A Little Help needed to walk in hospital room?: A Little Help needed climbing 3-5 steps with a railing? : A Little 6 Click Score: 18    End of Session Equipment Utilized During Treatment: Oxygen;Gait belt Activity Tolerance: Treatment limited secondary to medical complications (Comment) (drop in Sp02) Patient left: in chair;with call bell/phone within reach;with chair alarm set;with family/visitor present Nurse Communication:  Mobility status;Other (comment);Patient requests pain meds (02 sats) PT Visit Diagnosis: Pain;Difficulty in walking, not elsewhere classified (R26.2) Pain - Right/Left:  (bil) Pain - part of body: Ankle and joints of foot    Time: [id number] PT Time Calculation (min) (ACUTE ONLY): 16 min   Charges:   PT Evaluation $PT Eval Moderate Complexity: 1 Mod          Vale HavenShauna H, PT, DPT Acute Rehabilitation Services Secure chat  preferred Office [phone]     Blake DivineShauna A Zailey Audia 07/25/2022, 9:29 AM

## 2022-07-25 NOTE — Care Management Important Message (Signed)
Important Message  Patient Details  Name: [name] MRN: [id number] Date of Birth: 26-Jun-1940   Medicare Important Message Given:  Yes     Dorena Bodo 07/25/2022, 3:21 PM

## 2022-07-25 NOTE — Progress Notes (Addendum)
SATURATION QUALIFICATIONS: (This note is used to comply with regulatory documentation for home oxygen)  Patient Saturations on Room Air at Rest = 90%  Patient Saturations on Room Air while Ambulating = 83%  Patient Saturations on 2 Liters of oxygen while Ambulating = 91%  Please briefly explain why patient needs home oxygen: pt requires supplemental 02 to maintain Sp02 >90% with activity.   Vale Haven, PT, DPT Acute Rehabilitation Services Secure chat preferred Office [phone]

## 2022-07-26 ENCOUNTER — Emergency Department (HOSPITAL_COMMUNITY): Payer: Medicare HMO

## 2022-07-26 ENCOUNTER — Emergency Department (HOSPITAL_COMMUNITY)
Admission: EM | Admit: 2022-07-26 | Discharge: 2022-07-26 | Disposition: A | Discharge status: Home / Self Care | Payer: Medicare HMO | Attending: Emergency Medicine | Admitting: Emergency Medicine

## 2022-07-26 ENCOUNTER — Other Ambulatory Visit: Payer: Self-pay

## 2022-07-26 DIAGNOSIS — Z7982 Long term (current) use of aspirin: Secondary | ICD-10-CM | POA: Insufficient documentation

## 2022-07-26 DIAGNOSIS — D72829 Elevated white blood cell count, unspecified: Secondary | ICD-10-CM | POA: Diagnosis not present

## 2022-07-26 DIAGNOSIS — R0902 Hypoxemia: Secondary | ICD-10-CM | POA: Diagnosis not present

## 2022-07-26 DIAGNOSIS — Z794 Long term (current) use of insulin: Secondary | ICD-10-CM | POA: Insufficient documentation

## 2022-07-26 DIAGNOSIS — I10 Essential (primary) hypertension: Secondary | ICD-10-CM | POA: Insufficient documentation

## 2022-07-26 DIAGNOSIS — E119 Type 2 diabetes mellitus without complications: Secondary | ICD-10-CM | POA: Diagnosis not present

## 2022-07-26 DIAGNOSIS — R1084 Generalized abdominal pain: Secondary | ICD-10-CM | POA: Diagnosis not present

## 2022-07-26 DIAGNOSIS — K449 Diaphragmatic hernia without obstruction or gangrene: Secondary | ICD-10-CM | POA: Diagnosis not present

## 2022-07-26 DIAGNOSIS — Z7984 Long term (current) use of oral hypoglycemic drugs: Secondary | ICD-10-CM | POA: Insufficient documentation

## 2022-07-26 DIAGNOSIS — Z79899 Other long term (current) drug therapy: Secondary | ICD-10-CM | POA: Diagnosis not present

## 2022-07-26 DIAGNOSIS — Z8546 Personal history of malignant neoplasm of prostate: Secondary | ICD-10-CM | POA: Diagnosis not present

## 2022-07-26 DIAGNOSIS — R1030 Lower abdominal pain, unspecified: Secondary | ICD-10-CM | POA: Diagnosis not present

## 2022-07-26 DIAGNOSIS — R1032 Left lower quadrant pain: Secondary | ICD-10-CM | POA: Diagnosis not present

## 2022-07-26 LAB — URINALYSIS, ROUTINE W REFLEX MICROSCOPIC
Bilirubin Urine: NEGATIVE
Glucose, UA: NEGATIVE mg/dL
Hgb urine dipstick: NEGATIVE
Ketones, ur: 5 mg/dL — AB
Leukocytes,Ua: NEGATIVE
Nitrite: NEGATIVE
Protein, ur: 30 mg/dL — AB
Specific Gravity, Urine: 1.015 (ref 1.005–1.030)
pH: 5 (ref 5.0–8.0)

## 2022-07-26 LAB — COMPREHENSIVE METABOLIC PANEL
ALT: 8 U/L (ref 0–44)
AST: 24 U/L (ref 15–41)
Albumin: 3.1 g/dL — ABNORMAL LOW (ref 3.5–5.0)
Alkaline Phosphatase: 62 U/L (ref 38–126)
Anion gap: 11 (ref 5–15)
BUN: 18 mg/dL (ref 8–23)
CO2: 25 mmol/L (ref 22–32)
Calcium: 8.7 mg/dL — ABNORMAL LOW (ref 8.9–10.3)
Chloride: 101 mmol/L (ref 98–111)
Creatinine, Ser: 1.52 mg/dL — ABNORMAL HIGH (ref 0.61–1.24)
GFR, Estimated: 46 mL/min — ABNORMAL LOW (ref >60)
Glucose, Bld: 194 mg/dL — ABNORMAL HIGH (ref 70–99)
Potassium: 4.8 mmol/L (ref 3.5–5.1)
Sodium: 137 mmol/L (ref 135–145)
Total Bilirubin: 1.1 mg/dL (ref 0.3–1.2)
Total Protein: 8.1 g/dL (ref 6.5–8.1)

## 2022-07-26 LAB — CBC WITH DIFFERENTIAL/PLATELET
Abs Immature Granulocytes: 0.05 10*3/uL (ref 0.00–0.07)
Basophils Absolute: 0 10*3/uL (ref 0.0–0.1)
Basophils Relative: 0 %
Eosinophils Absolute: 0 10*3/uL (ref 0.0–0.5)
Eosinophils Relative: 0 %
HCT: 39.6 % (ref 39.0–52.0)
Hemoglobin: 12.9 g/dL — ABNORMAL LOW (ref 13.0–17.0)
Immature Granulocytes: 0 %
Lymphocytes Relative: 16 %
Lymphs Abs: 1.8 10*3/uL (ref 0.7–4.0)
MCH: 24.7 pg — ABNORMAL LOW (ref 26.0–34.0)
MCHC: 32.6 g/dL (ref 30.0–36.0)
MCV: 75.7 fL — ABNORMAL LOW (ref 80.0–100.0)
Monocytes Absolute: 1.7 10*3/uL — ABNORMAL HIGH (ref 0.1–1.0)
Monocytes Relative: 15 %
Neutro Abs: 7.7 10*3/uL (ref 1.7–7.7)
Neutrophils Relative %: 69 %
Platelets: 185 10*3/uL (ref 150–400)
RBC: 5.23 MIL/uL (ref 4.22–5.81)
RDW: 17.2 % — ABNORMAL HIGH (ref 11.5–15.5)
WBC: 11.2 10*3/uL — ABNORMAL HIGH (ref 4.0–10.5)
nRBC: 0 % (ref 0.0–0.2)

## 2022-07-26 LAB — LACTIC ACID, PLASMA: Lactic Acid, Venous: 1.2 mmol/L (ref 0.5–1.9)

## 2022-07-26 LAB — LIPASE, BLOOD: Lipase: 36 U/L (ref 11–51)

## 2022-07-26 MED ORDER — SODIUM CHLORIDE 0.9 % IV BOLUS
500.0000 mL | Freq: Once | INTRAVENOUS | Status: AC
  Administered 2022-07-26: 500 mL via INTRAVENOUS

## 2022-07-26 MED ORDER — FENTANYL CITRATE PF 50 MCG/ML IJ SOSY
50.0000 ug | PREFILLED_SYRINGE | Freq: Once | INTRAMUSCULAR | Status: AC
  Administered 2022-07-26: 50 ug via INTRAVENOUS
  Filled 2022-07-26: qty 1

## 2022-07-26 MED ORDER — IOHEXOL 350 MG/ML SOLN
75.0000 mL | Freq: Once | INTRAVENOUS | Status: AC | PRN
  Administered 2022-07-26: 75 mL via INTRAVENOUS

## 2022-07-26 NOTE — ED Triage Notes (Signed)
Pt BIB EMS due to abd and left flank pain for a week. Pt left this yesterday after a week of admission. Pt got home yesterday and didn't feel better so came back today. Axox4. Pt always on 2L of O2.

## 2022-07-26 NOTE — ED Notes (Signed)
Pt complained of feeling cold. Given 2 warm blankets. Temp check and resulted 98.6

## 2022-07-26 NOTE — Discharge Instructions (Signed)
You are seen in the emergency department for lower abdominal pain.  You had blood work urinalysis and a CAT scan that did not show a definite cause of your symptoms.  Please continue your regular medications and follow-up with your primary care doctor as scheduled.  Return to the emergency department if any worsening or concerning symptoms.

## 2022-07-26 NOTE — ED Provider Notes (Signed)
[place] [facility]   CSN: [id number] Arrival date & time: 07/26/22  1213     History {Add pertinent medical, surgical, social history, OB history to HPI:1} Chief Complaint  Patient presents with   Abdominal Pain    Alan NajjarLuther Craig is a 82 y.o. male.  He has a history of hypertension diabetes sarcoidosis prostate cancer.  He was admitted 4/3 for swelling of his legs and abdomen, abdominal pain and diarrhea.  He had a CT angio that showed increased adenopathy, high-grade stenosis of celiac axis and small bilateral inguinal hernias.  He was seen by vascular and pulmonary and ultimately diagnosed yesterday.  He said since he is returned home he continues to have pain in his left lower quadrant worse with movement.  No vomiting or diarrhea no fever no worsening shortness of breath.  He has tried nothing for his symptoms and called the ambulance to come back earlier this morning.  He is not sure what they did for him in the hospital.   The history is provided by the patient and the EMS personnel.  Abdominal Pain Pain location:  LLQ Pain quality: aching   Pain severity:  Moderate Duration:  3 weeks Timing:  Intermittent Progression:  Unchanged Relieved by:  None tried Worsened by:  Movement Ineffective treatments:  None tried Associated symptoms: no constipation, no cough, no diarrhea, no fever, no nausea, no shortness of breath and no vomiting   Risk factors: recent hospitalization        Home Medications Prior to Admission medications   Medication Sig Start Date End Date Taking? Authorizing Provider  ACCU-CHEK AVIVA PLUS test strip USE AS INSTRUCTED TO CHECK BLOOD SUGAR THREE TIMES DAILY 11/11/16   Reather LittlerKumar, Ajay, MD  albuterol (VENTOLIN HFA) 108 (90 Base) MCG/ACT inhaler Inhale 2 puffs into the lungs every 6 (six) hours as needed for wheezing or shortness of breath. 07/25/22   Lanae BoastKc, Ramesh, MD  amLODipine (NORVASC) 10 MG tablet Take 1 tablet  (10 mg total) by mouth daily. 10/30/17   Shon HaleEmokpae, [name], MD  aspirin 81 MG tablet Take 81 mg by mouth at bedtime.     [provider]  atorvastatin (LIPITOR) 20 MG tablet Take 20 mg by mouth daily. 12/16/18   [provider]  benzonatate (TESSALON) 100 MG capsule Take 1 capsule by mouth every 8 (eight) hours for cough. Patient taking differently: Take 100 mg by mouth daily as needed for cough. 10/01/21   Mardella LaymanHagler, Brian, MD  Blood Glucose Monitoring Suppl (ACCU-CHEK AVIVA PLUS) w/Device KIT Use to check blood sugar 3 times per day dx code E11.65 03/12/16   Reather LittlerKumar, Ajay, MD  carbidopa-levodopa (SINEMET IR) 25-100 MG tablet Take 1 pill in the morning, 1 pill in the afternoon, and 2 pills at night Patient taking differently: Take 1-2 tablets by mouth See admin instructions. Take 1 tablet by mouth in the morning, 1 tablet in the afternoon and 2 tablets at night 03/20/22   Ocie Doynehima, Jennifer, MD  donepezil (ARICEPT) 10 MG tablet Take 1 tablet (10 mg total) by mouth at bedtime. 03/20/22   Ocie Doynehima, Jennifer, MD  dorzolamide-timolol (COSOPT) 22.3-6.8 MG/ML ophthalmic solution Place 1 drop into both eyes 2 (two) times daily.     [provider]  escitalopram (LEXAPRO) 10 MG tablet Take 1 tablet (10 mg total) by mouth daily. 03/20/22   Ocie Doynehima, Jennifer, MD  ferrous sulfate 325 (65 FE) MG tablet Take 1 tablet (325 mg total) by mouth  2 (two) times daily. 10/30/17   Shon Hale, MD  fish oil-omega-3 fatty acids 1000 MG capsule Take 1 g by mouth daily.     [provider]  GEMTESA 75 MG TABS Take 75 mg by mouth daily. 09/03/21   [provider]  hydroxypropyl methylcellulose / hypromellose (ISOPTO TEARS / GONIOVISC) 2.5 % ophthalmic solution Place 1 drop into both eyes 4 (four) times daily as needed for dry eyes.     [provider]  indomethacin (INDOCIN) 50 MG capsule Take 1 capsule (50 mg total) by mouth 2 (two) times daily as needed (gout). Take with food 12/24/19    Dahlia Byes A, NP  insulin regular (NOVOLIN R RELION) 100 units/mL injection Inject 0.05-0.15 mLs (5-15 Units total) into the skin 3 (three) times daily with meals as needed for high blood sugar (per sliding scale). 10/30/17   Shon Hale, MD  Insulin Syringe-Needle U-100 (INSULIN SYRINGE .5CC/31GX5/16") 31G X 5/16" 0.5 ML MISC Use one to inject insulin daily 05/02/15   Reather Littler, MD  latanoprost (XALATAN) 0.005 % ophthalmic solution Place 1 drop into both eyes at bedtime.  02/17/14   [provider]  meclizine (ANTIVERT) 25 MG tablet Take 1 tablet (25 mg total) by mouth 3 (three) times daily as needed for dizziness. 07/05/16   Dione Booze, MD  memantine (NAMENDA) 10 MG tablet Take 1 tablet (10 mg total) by mouth 2 (two) times daily. 03/20/22   Ocie Doyne, MD  metFORMIN (GLUCOPHAGE) 1000 MG tablet Take 1 tablet (1,000 mg total) by mouth 2 (two) times daily with a meal. 10/30/17   [name], [name], MD  metoprolol tartrate (LOPRESSOR) 25 MG tablet Take 1 tablet (25 mg total) by mouth 2 (two) times daily. 10/30/17   Shon Hale, MD  Multiple Vitamins-Minerals (CENTRUM SILVER ADULT 50+) TABS Take 1 tablet by mouth daily.    [provider]  Multiple Vitamins-Minerals (PRESERVISION AREDS) TABS Take 1 capsule by mouth in the morning and at bedtime.    [provider]  tamsulosin (FLOMAX) 0.4 MG CAPS capsule Take 1 capsule (0.4 mg total) by mouth at bedtime. 10/30/17   Shon Hale, MD      Allergies    Ace inhibitors and Allopurinol    Review of Systems   Review of Systems  Constitutional:  Negative for fever.  Respiratory:  Negative for cough and shortness of breath.   Gastrointestinal:  Positive for abdominal pain. Negative for constipation, diarrhea, nausea and vomiting.    Physical Exam Updated Vital Signs BP (!) 151/130   Pulse 84   Temp 98.8 F (37.1 C) (Oral)   Ht  (1.753 m)   Wt 86.2 kg   SpO2 100%   BMI 28.06 kg/m  Physical  Exam Vitals and nursing note reviewed.  Constitutional:      General: He is not in acute distress.    Appearance: Normal appearance. He is well-developed.  HENT:     Head: Normocephalic and atraumatic.  Eyes:     Conjunctiva/sclera: Conjunctivae normal.  Cardiovascular:     Rate and Rhythm: Normal rate and regular rhythm.     Heart sounds: No murmur heard. Pulmonary:     Effort: Pulmonary effort is normal. No respiratory distress.     Breath sounds: Normal breath sounds.  Abdominal:     Palpations: Abdomen is soft.     Tenderness: There is no abdominal tenderness. There is no guarding or rebound.  Musculoskeletal:     Cervical back:  Neck supple.     Right lower leg: Edema present.     Left lower leg: Edema present.  Skin:    General: Skin is warm and dry.     Capillary Refill: Capillary refill takes less than 2 seconds.  Neurological:     General: No focal deficit present.     Mental Status: He is alert.     ED Results / Procedures / Treatments   Labs (all labs ordered are listed, but only abnormal results are displayed) Labs Reviewed - No data to display  EKG EKG Interpretation  Date/Time:  Saturday July 26 2022 12:19:29 EDT Ventricular Rate:  80 PR Interval:  142 QRS Duration: 95 QT Interval:  419 QTC Calculation: 484 R Axis:   16 Text Interpretation: Sinus rhythm Atrial premature complex Borderline prolonged QT interval No significant change since last tracing Confirmed by Meridee Score [phone]) on 07/26/2022 12:22:59 PM  Radiology DG Chest Port 1 View  Result Date: 07/25/2022 CLINICAL DATA:  Respiratory failure.  Abdominal pain EXAM: PORTABLE CHEST 1 VIEW COMPARISON:  CT angiogram and x-ray 07/22/2022. Older x-rays as well FINDINGS: Mild vascular congestion. No consolidation, pneumothorax or effusion. There is fullness of the hila bilaterally consistent with known abnormal nodes. There are also nodes in the mediastinum on prior CT scan. Please correlate with the  CT. Normal cardiopericardial silhouette. Left basilar atelectasis. IMPRESSION: Enlargement of the hila bilaterally consistent with known abnormal lymph nodes. Mild vascular congestion. Please correlate with CT of 07/23/2022 Left basilar atelectasis. Electronically Signed   By: Karen Kays M.D.   On: 07/25/2022 16:29   ECHOCARDIOGRAM COMPLETE  Result Date: 07/25/2022    ECHOCARDIOGRAM REPORT   Patient Name:   [name] Date of Exam: 07/24/2022 Medical Rec #:  [id number]     Height:       72.0 in Accession #:    [id number]    Weight:       188.3 lb Date of Birth:  03-Feb-1941      BSA:          2.077 m Patient Age:    81 years      BP:           113/53 mmHg Patient Gender: M             HR:           69 bpm. Exam Location:  Inpatient Procedure: 2D Echo, Color Doppler, Cardiac Doppler and 3D Echo Indications:    Congestive Heart Failure I50.9  History:        Patient has no prior history of Echocardiogram examinations.                 Cancer, Signs/Symptoms:Altered Mental Status; Risk                 Factors:Diabetes, Dyslipidemia, Sleep Apnea and Hypertension.  Sonographer:    Aron Baba Referring Phys: [id number] RAMESH KC  Sonographer Comments: Image acquisition challenging due to respiratory motion. IMPRESSIONS  1. Left ventricular ejection fraction, by estimation, is 50 to 55%. Left ventricular ejection fraction by 3D volume is 51 %. The left ventricle has low normal function. The left ventricle has no regional wall motion abnormalities. There is mild concentric left ventricular hypertrophy. Left ventricular diastolic parameters are consistent with Grade I diastolic dysfunction (impaired relaxation).  2. Right ventricular systolic function is normal. The right ventricular size is normal. There is normal pulmonary artery systolic pressure.  3. Left atrial size was  severely dilated.  4. The mitral valve is myxomatous. No evidence of mitral valve regurgitation. No evidence of mitral stenosis.  5. The aortic valve  is tricuspid. There is mild calcification of the aortic valve. There is mild thickening of the aortic valve. Aortic valve regurgitation is mild. No aortic stenosis is present.  6. The inferior vena cava is normal in size with greater than 50% respiratory variability, suggesting right atrial pressure of 3 mmHg. FINDINGS  Left Ventricle: Left ventricular ejection fraction, by estimation, is 50 to 55%. Left ventricular ejection fraction by 3D volume is 51 %. The left ventricle has low normal function. The left ventricle has no regional wall motion abnormalities. The left ventricular internal cavity size was normal in size. There is mild concentric left ventricular hypertrophy. Left ventricular diastolic parameters are consistent with Grade I diastolic dysfunction (impaired relaxation). Right Ventricle: The right ventricular size is normal. No increase in right ventricular wall thickness. Right ventricular systolic function is normal. There is normal pulmonary artery systolic pressure. The tricuspid regurgitant velocity is 2.50 m/s, and  with an assumed right atrial pressure of 3 mmHg, the estimated right ventricular systolic pressure is 28.0 mmHg. Left Atrium: Left atrial size was severely dilated. Right Atrium: Right atrial size was normal in size. Pericardium: There is no evidence of pericardial effusion. Mitral Valve: The mitral valve is myxomatous. No evidence of mitral valve regurgitation. No evidence of mitral valve stenosis. Tricuspid Valve: The tricuspid valve is normal in structure. Tricuspid valve regurgitation is trivial. No evidence of tricuspid stenosis. Aortic Valve: The aortic valve is tricuspid. There is mild calcification of the aortic valve. There is mild thickening of the aortic valve. Aortic valve regurgitation is mild. Aortic regurgitation PHT measures 539 msec. No aortic stenosis is present. Pulmonic Valve: The pulmonic valve was normal in structure. Pulmonic valve regurgitation is mild. No  evidence of pulmonic stenosis. Aorta: The aortic root is normal in size and structure. Venous: The inferior vena cava is normal in size with greater than 50% respiratory variability, suggesting right atrial pressure of 3 mmHg. IAS/Shunts: No atrial level shunt detected by color flow Doppler.  LEFT VENTRICLE PLAX 2D LVIDd:         3.40 cm         Diastology LVIDs:         2.50 cm         LV e' medial:  6.64 cm/s LV PW:         1.04 cm         LV e' lateral: 10.70 cm/s LV IVS:        1.20 cm LVOT diam:     2.40 cm LV SV:         89              3D Volume EF LV SV Index:   43              LV 3D EF:    Left LVOT Area:     4.52 cm                     ventricul                                             ar  ejection                                             fraction                                             by 3D                                             volume is                                             51 %.                                 3D Volume EF:                                3D EF:        51 %                                LV EDV:       137 ml                                LV ESV:       67 ml                                LV SV:        69 ml RIGHT VENTRICLE RV S prime:     14.70 cm/s TAPSE (M-mode): 1.9 cm LEFT ATRIUM           Index        RIGHT ATRIUM           Index LA diam:      3.30 cm 1.59 cm/m   RA Area:     18.60 cm LA Vol (A2C): 74.8 ml 36.02 ml/m  RA Volume:   55.40 ml  26.68 ml/m LA Vol (A4C): 88.5 ml 42.62 ml/m  AORTIC VALVE             PULMONIC VALVE LVOT Vmax:   64.70 cm/s  PR End Diast Vel: 10.37 msec LVOT Vmean:  49.600 cm/s LVOT VTI:    0.196 m AI PHT:      539 msec  AORTA Ao Root diam: 3.80 cm Ao Asc diam:  3.50 cm MITRAL VALVE               TRICUSPID VALVE MV Area (PHT): 3.27 cm    TR Peak grad:   25.0 mmHg MV Decel Time: 232 msec    TR Vmax:        250.00 cm/s MR Peak grad: 0.9 mmHg MR  Vmax:      48.60 cm/s   SHUNTS MV A  velocity: 83.70 cm/s  Systemic VTI:  0.20 m                            Systemic Diam: 2.40 cm Chilton Si MD Electronically signed by Chilton Si MD Signature Date/Time: 07/25/2022/7:45:18 AM    Final     Procedures Procedures  {Document cardiac monitor, telemetry assessment procedure when appropriate:1}  Medications Ordered in ED Medications  fentaNYL (SUBLIMAZE) injection 50 mcg (has no administration in time range)    ED Course/ Medical Decision Making/ A&P   {   Click here for ABCD2, HEART and other calculatorsREFRESH Note before signing :1}                          Medical Decision Making Amount and/or Complexity of Data Reviewed Labs: ordered.  Risk Prescription drug management.   This patient complains of ***; this involves an extensive number of treatment Options and is a complaint that carries with it a high risk of complications and morbidity. The differential includes ***  I ordered, reviewed and interpreted labs, which included *** I ordered medication *** and reviewed PMP when indicated. I ordered imaging studies which included *** and I independently    visualized and interpreted imaging which showed *** Additional history obtained from *** Previous records obtained and reviewed *** I consulted *** and discussed lab and imaging findings and discussed disposition.  Cardiac monitoring reviewed, *** Social determinants considered, *** Critical Interventions: ***  After the interventions stated above, I reevaluated the patient and found *** Admission and further testing considered, ***   {Document critical care time when appropriate:1} {Document review of labs and clinical decision tools ie heart score, Chads2Vasc2 etc:1}  {Document your independent review of radiology images, and any outside records:1} {Document your discussion with family members, caretakers, and with consultants:1} {Document social determinants of health affecting pt's  care:1} {Document your decision making why or why not admission, treatments were needed:1} Final Clinical Impression(s) / ED Diagnoses Final diagnoses:  None    Rx / DC Orders ED Discharge Orders     None

## 2022-07-26 NOTE — ED Notes (Signed)
Family is assisting pt with getting dressed. WC provided and pt moved to triage to be taken home with family.

## 2022-07-30 ENCOUNTER — Ambulatory Visit: Payer: Medicare HMO | Admitting: Podiatry

## 2022-07-30 ENCOUNTER — Ambulatory Visit
Admission: RE | Admit: 2022-07-30 | Discharge: 2022-07-30 | Disposition: A | Discharge status: Home / Self Care | Payer: Medicare HMO | Source: Ambulatory Visit | Attending: Family Medicine | Admitting: Family Medicine

## 2022-07-30 ENCOUNTER — Other Ambulatory Visit: Payer: Self-pay | Admitting: Family Medicine

## 2022-07-30 DIAGNOSIS — F0150 Vascular dementia without behavioral disturbance: Secondary | ICD-10-CM | POA: Diagnosis not present

## 2022-07-30 DIAGNOSIS — J9601 Acute respiratory failure with hypoxia: Secondary | ICD-10-CM | POA: Diagnosis not present

## 2022-07-30 DIAGNOSIS — M25552 Pain in left hip: Secondary | ICD-10-CM | POA: Diagnosis not present

## 2022-07-30 DIAGNOSIS — Z862 Personal history of diseases of the blood and blood-forming organs and certain disorders involving the immune mechanism: Secondary | ICD-10-CM | POA: Diagnosis not present

## 2022-07-30 DIAGNOSIS — R591 Generalized enlarged lymph nodes: Secondary | ICD-10-CM | POA: Diagnosis not present

## 2022-07-30 DIAGNOSIS — Z8546 Personal history of malignant neoplasm of prostate: Secondary | ICD-10-CM | POA: Diagnosis not present

## 2022-07-30 DIAGNOSIS — Z8551 Personal history of malignant neoplasm of bladder: Secondary | ICD-10-CM | POA: Diagnosis not present

## 2022-08-05 DIAGNOSIS — C61 Malignant neoplasm of prostate: Secondary | ICD-10-CM | POA: Diagnosis not present

## 2022-08-08 DIAGNOSIS — Z794 Long term (current) use of insulin: Secondary | ICD-10-CM | POA: Diagnosis not present

## 2022-08-08 DIAGNOSIS — H401112 Primary open-angle glaucoma, right eye, moderate stage: Secondary | ICD-10-CM | POA: Diagnosis not present

## 2022-08-08 DIAGNOSIS — H401123 Primary open-angle glaucoma, left eye, severe stage: Secondary | ICD-10-CM | POA: Diagnosis not present

## 2022-08-08 DIAGNOSIS — Z961 Presence of intraocular lens: Secondary | ICD-10-CM | POA: Diagnosis not present

## 2022-08-08 DIAGNOSIS — E119 Type 2 diabetes mellitus without complications: Secondary | ICD-10-CM | POA: Diagnosis not present

## 2022-08-08 DIAGNOSIS — H26491 Other secondary cataract, right eye: Secondary | ICD-10-CM | POA: Diagnosis not present

## 2022-08-11 ENCOUNTER — Ambulatory Visit (HOSPITAL_BASED_OUTPATIENT_CLINIC_OR_DEPARTMENT_OTHER): Payer: Medicare HMO | Admitting: Pulmonary Disease

## 2022-08-12 DIAGNOSIS — R351 Nocturia: Secondary | ICD-10-CM | POA: Diagnosis not present

## 2022-08-12 DIAGNOSIS — N3281 Overactive bladder: Secondary | ICD-10-CM | POA: Diagnosis not present

## 2022-08-12 DIAGNOSIS — C678 Malignant neoplasm of overlapping sites of bladder: Secondary | ICD-10-CM | POA: Diagnosis not present

## 2022-08-13 ENCOUNTER — Encounter: Payer: Self-pay | Admitting: Emergency Medicine

## 2022-08-13 ENCOUNTER — Ambulatory Visit: Payer: Medicare HMO | Admitting: Emergency Medicine

## 2022-08-13 VITALS — BP 130/86 | HR 69 | Temp 98.8°F | Wt 182.2 lb

## 2022-08-13 DIAGNOSIS — D869 Sarcoidosis, unspecified: Secondary | ICD-10-CM

## 2022-08-13 DIAGNOSIS — J449 Chronic obstructive pulmonary disease, unspecified: Secondary | ICD-10-CM

## 2022-08-13 DIAGNOSIS — G4733 Obstructive sleep apnea (adult) (pediatric): Secondary | ICD-10-CM | POA: Diagnosis not present

## 2022-08-13 DIAGNOSIS — R9389 Abnormal findings on diagnostic imaging of other specified body structures: Secondary | ICD-10-CM | POA: Diagnosis not present

## 2022-08-13 DIAGNOSIS — J9601 Acute respiratory failure with hypoxia: Secondary | ICD-10-CM

## 2022-08-13 NOTE — Progress Notes (Signed)
Subjective:    Patient ID: [name], male    DOB: 1940/05/16, 82 y.o.   MRN: [id number]  HPI 82 year old gentleman who has been followed by Dr. Craige Cotta in our office.  He has a history of tobacco use (pack years), bilateral mediastinal hilar adenopathy and subpleural nodular disease, interstitial changes that have been felt to be consistent with sarcoidosis.  Also with obstructive sleep apnea (AHI 35.4 by PSG 09/08/2021) that is managed at [facility] neurology, diabetes, hypertension with chronic diastolic CHF, history of prostate cancer (radiation), vascular dementia.   He attempted pulmonary function testing 02/12/2022 but was unable to complete due to technique.  He is transferring his care over to [facility].  He was admitted in early April with urinary tract infection, nausea vomiting in abdominal discomfort.  During that admission he was found to be hypoxemic and started on supplemental oxygen.  Also had chest imaging that showed paraseptal emphysema, mediastinal and hilar adenopathy suspicious for possible chronic lymphoproliferative process or even metastatic disease. He reports that he is feeling better. Breathing well, not using the O2. Uses albuterol very rarely. LE edema has resolved. He typically has good compliance with his CPAP machine, but not for the last week - he needs some equipment. No cough or wheeze.   CT chest 07/23/2022 reviewed by me shows slight progression of mediastinal and hilar adenopathy compared with 09/2006, mild paraseptal emphysema and some subpleural him bibasilar predominant pulmonary fibrotic change.  Scattered pulmonary nodules, 11 mm noncalcified nodule anterior right upper lobe (stable)   Review of Systems As per HPI  Past Medical History:  Diagnosis Date   Bladder cancer    in situ   Cataracts, bilateral    Diabetes mellitus    insulin dependent   Gout    Hearing loss    Heart disease, unspecified    Hypercholesterolemia    Hypertension    Memory  changes    Prostate cancer    s/p radiation   Sarcoidosis    Unspecified glaucoma(365.9)      Family History  Problem Relation Age of Onset   Hypertension Mother    Diabetes Mellitus I Mother    Hypertension Father    Coronary artery disease Father    Hypertension Sister    Hypertension Brother    Coronary artery disease Brother    Diabetes Mellitus I Brother    Diabetes Mellitus I Brother    Cancer Neg Hx      Social History   Socioeconomic History   Marital status: Married    Spouse name: Erskine Squibb   Number of children: 3   Years of education: 9   Highest education level: 9th grade  Occupational History    Comment: retired Radio broadcast assistant  Tobacco Use   Smoking status: Former    Packs/day: .5    Types: Cigarettes    Quit date: 04/21/2005    Years since quitting: 17.3   Smokeless tobacco: Never  Substance and Sexual Activity   Alcohol use: No   Drug use: No   Sexual activity: Not Currently  Other Topics Concern   Not on file  Social History Narrative   06/05/21 Lives with wife   Right handed   Caffeine: 1 C of coffee a day   Social Determinants of Health   Financial Resource Strain: Not on file  Food Insecurity: No Food Insecurity (04/26/2021)   Hunger Vital Sign    Worried About Running Out of Food in the Last Year: Never  true    Ran Out of Food in the Last Year: Never true  Transportation Needs: No Transportation Needs (04/26/2021)   PRAPARE - Administrator, Civil Service (Medical): No    Lack of Transportation (Non-Medical): No  Physical Activity: Not on file  Stress: Not on file  Social Connections: Not on file  Intimate Partner Violence: Not on file    From [place].  He worked in a Pharmacist, hospital  Allergies  Allergen Reactions   Ace Inhibitors Swelling    Benazepril   Allopurinol Rash     Outpatient Medications Prior to Visit  Medication Sig Dispense Refill   ACCU-CHEK AVIVA PLUS test strip USE AS INSTRUCTED TO CHECK BLOOD SUGAR THREE  TIMES DAILY 300 each 1   albuterol (VENTOLIN HFA) 108 (90 Base) MCG/ACT inhaler Inhale 2 puffs into the lungs every 6 (six) hours as needed for wheezing or shortness of breath. 8 g 2   amLODipine (NORVASC) 10 MG tablet Take 1 tablet (10 mg total) by mouth daily. 30 tablet 1   aspirin 81 MG tablet Take 81 mg by mouth at bedtime.      atorvastatin (LIPITOR) 20 MG tablet Take 20 mg by mouth daily.     Blood Glucose Monitoring Suppl (ACCU-CHEK AVIVA PLUS) w/Device KIT Use to check blood sugar 3 times per day dx code E11.65 1 kit 0   carbidopa-levodopa (SINEMET IR) 25-100 MG tablet Take 1 pill in the morning, 1 pill in the afternoon, and 2 pills at night (Patient taking differently: Take 1-2 tablets by mouth See admin instructions. Take 1 tablet by mouth in the morning, 1 tablet in the afternoon and 2 tablets at night) 360 tablet 3   donepezil (ARICEPT) 10 MG tablet Take 1 tablet (10 mg total) by mouth at bedtime. 30 tablet 3   dorzolamide-timolol (COSOPT) 22.3-6.8 MG/ML ophthalmic solution Place 1 drop into both eyes 2 (two) times daily.      escitalopram (LEXAPRO) 10 MG tablet Take 1 tablet (10 mg total) by mouth daily. 90 tablet 3   ferrous sulfate 325 (65 FE) MG tablet Take 1 tablet (325 mg total) by mouth 2 (two) times daily. 60 tablet 3   fish oil-omega-3 fatty acids 1000 MG capsule Take 1 g by mouth daily.      GEMTESA 75 MG TABS Take 75 mg by mouth daily.     hydroxypropyl methylcellulose / hypromellose (ISOPTO TEARS / GONIOVISC) 2.5 % ophthalmic solution Place 1 drop into both eyes 4 (four) times daily as needed for dry eyes.      indomethacin (INDOCIN) 50 MG capsule Take 1 capsule (50 mg total) by mouth 2 (two) times daily as needed (gout). Take with food 20 capsule 1   insulin regular (NOVOLIN R RELION) 100 units/mL injection Inject 0.05-0.15 mLs (5-15 Units total) into the skin 3 (three) times daily with meals as needed for high blood sugar (per sliding scale). 10 mL 2   Insulin Syringe-Needle  U-100 (INSULIN SYRINGE .5CC/31GX5/16") 31G X 5/16" 0.5 ML MISC Use one to inject insulin daily 30 each 1   latanoprost (XALATAN) 0.005 % ophthalmic solution Place 1 drop into both eyes at bedtime.      meclizine (ANTIVERT) 25 MG tablet Take 1 tablet (25 mg total) by mouth 3 (three) times daily as needed for dizziness. 30 tablet 0   memantine (NAMENDA) 10 MG tablet Take 1 tablet (10 mg total) by mouth 2 (two) times daily. 180 tablet 3  metFORMIN (GLUCOPHAGE) 1000 MG tablet Take 1 tablet (1,000 mg total) by mouth 2 (two) times daily with a meal. 60 tablet 2   metoprolol tartrate (LOPRESSOR) 25 MG tablet Take 1 tablet (25 mg total) by mouth 2 (two) times daily. 60 tablet 2   Multiple Vitamins-Minerals (CENTRUM SILVER ADULT 50+) TABS Take 1 tablet by mouth daily.     Multiple Vitamins-Minerals (PRESERVISION AREDS) TABS Take 1 capsule by mouth in the morning and at bedtime.     tamsulosin (FLOMAX) 0.4 MG CAPS capsule Take 1 capsule (0.4 mg total) by mouth at bedtime. 30 capsule 1   benzonatate (TESSALON) 100 MG capsule Take 1 capsule by mouth every 8 (eight) hours for cough. (Patient not taking: Reported on 08/13/2022) 21 capsule 0   No facility-administered medications prior to visit.         Objective:   Physical Exam  Vitals:   08/13/22 0845  BP: 130/86  Pulse: 69  Temp: 98.8 F (37.1 C)  TempSrc: Oral  SpO2: 96%  Weight: 182 lb 3.2 oz (82.6 kg)   Gen: Pleasant, well-nourished, in no distress,  normal affect  ENT: No lesions,  mouth clear,  oropharynx clear, no postnasal drip  Neck: No JVD, no stridor  Lungs: No use of accessory muscles, no crackles or wheezing on normal respiration, no wheeze on forced expiration  Cardiovascular: RRR, heart sounds normal, no murmur or gallops, no peripheral edema  Musculoskeletal: No deformities, no cyanosis or clubbing  Neuro: alert, awake, non focal  Skin: Warm, no lesions or rash     Assessment & Plan:  Severe obstructive sleep  apnea-hypopnea syndrome Reliable with his CPAP except for last week when he needed supplies. Encouraged him to get restarted asap.   Abnormal CT of the chest With mediastinal adenopathy, base predominant interstitial prominence.  He carries a diagnosis of presumed sarcoidosis.  Unclear whether this was biopsied.  He cannot recall.  CT chest 07/23/2022 with some mild progression of interstitial prominence compared with remote scans but no clear active sarcoid.  He is clinically stable.  We will plan to follow his CT chest in 6 months to ensure interval stability.  No clear indication to treat with corticosteroids at this time  Acute respiratory failure with hypoxia He was sent home after recent acute hospitalization with supplemental oxygen.  He states that he has not been using it for the last several days.  We will repeat his walking oximetry to see if this could be discontinued.  COPD (chronic obstructive pulmonary disease) He was unable to complete pulmonary function testing.  Presumed COPD given his clinical symptoms, presumed sarcoid, emphysematous change on CT chest.  Not currently on scheduled BD.  Plan to continue albuterol as needed.   Levy Pupa, MD, PhD 08/13/2022, 12:58 PM [facility] [phone] or if no answer before 7:00PM call [phone] For any issues after 7:00PM please call eLink [phone]

## 2022-08-13 NOTE — Patient Instructions (Signed)
Please keep your albuterol available use 2 puffs if needed for shortness of breath, chest tightness, wheezing. We will plan to repeat your CT scan of the chest without contrast in 6 months We will perform a walking oximetry today on room air Get back on your CPAP every night reliably Follow Dr. Delton Coombes in 6 months after your CT chest so we can review the results together.  Call sooner if you have any problems.

## 2022-08-13 NOTE — Assessment & Plan Note (Signed)
Reliable with his CPAP except for last week when he needed supplies. Encouraged him to get restarted asap.

## 2022-08-13 NOTE — Assessment & Plan Note (Signed)
He was unable to complete pulmonary function testing.  Presumed COPD given his clinical symptoms, presumed sarcoid, emphysematous change on CT chest.  Not currently on scheduled BD.  Plan to continue albuterol as needed.

## 2022-08-13 NOTE — Assessment & Plan Note (Signed)
He was sent home after recent acute hospitalization with supplemental oxygen.  He states that he has not been using it for the last several days.  We will repeat his walking oximetry to see if this could be discontinued.

## 2022-08-13 NOTE — Assessment & Plan Note (Signed)
With mediastinal adenopathy, base predominant interstitial prominence.  He carries a diagnosis of presumed sarcoidosis.  Unclear whether this was biopsied.  He cannot recall.  CT chest 07/23/2022 with some mild progression of interstitial prominence compared with remote scans but no clear active sarcoid.  He is clinically stable.  We will plan to follow his CT chest in 6 months to ensure interval stability.  No clear indication to treat with corticosteroids at this time

## 2022-08-14 DIAGNOSIS — I10 Essential (primary) hypertension: Secondary | ICD-10-CM | POA: Diagnosis not present

## 2022-08-14 DIAGNOSIS — E1165 Type 2 diabetes mellitus with hyperglycemia: Secondary | ICD-10-CM | POA: Diagnosis not present

## 2022-08-14 DIAGNOSIS — N189 Chronic kidney disease, unspecified: Secondary | ICD-10-CM | POA: Diagnosis not present

## 2022-08-14 DIAGNOSIS — E7800 Pure hypercholesterolemia, unspecified: Secondary | ICD-10-CM | POA: Diagnosis not present

## 2022-08-19 ENCOUNTER — Telehealth: Payer: Self-pay | Admitting: Psychiatry

## 2022-08-19 DIAGNOSIS — F03B18 Unspecified dementia, moderate, with other behavioral disturbance: Secondary | ICD-10-CM

## 2022-08-19 DIAGNOSIS — J449 Chronic obstructive pulmonary disease, unspecified: Secondary | ICD-10-CM

## 2022-08-19 DIAGNOSIS — Z9981 Dependence on supplemental oxygen: Secondary | ICD-10-CM

## 2022-08-19 DIAGNOSIS — G214 Vascular parkinsonism: Secondary | ICD-10-CM

## 2022-08-19 DIAGNOSIS — G4733 Obstructive sleep apnea (adult) (pediatric): Secondary | ICD-10-CM

## 2022-08-19 NOTE — Telephone Encounter (Signed)
Daughter(on DPR) states she reached out to DME and was told they are waiting on order from Dr Dohmeier in order for them to fill the request for needed CPAP supplies, please reply.

## 2022-09-01 DIAGNOSIS — E1165 Type 2 diabetes mellitus with hyperglycemia: Secondary | ICD-10-CM | POA: Diagnosis not present

## 2022-09-11 DIAGNOSIS — E1165 Type 2 diabetes mellitus with hyperglycemia: Secondary | ICD-10-CM | POA: Diagnosis not present

## 2022-09-17 ENCOUNTER — Encounter: Payer: Self-pay | Admitting: Neurology

## 2022-09-17 ENCOUNTER — Ambulatory Visit: Payer: Medicare HMO | Admitting: Neurology

## 2022-09-17 VITALS — BP 136/74 | HR 60 | Ht 69.0 in | Wt 180.5 lb

## 2022-09-17 DIAGNOSIS — F03B18 Unspecified dementia, moderate, with other behavioral disturbance: Secondary | ICD-10-CM

## 2022-09-17 DIAGNOSIS — G218 Other secondary parkinsonism: Secondary | ICD-10-CM | POA: Diagnosis not present

## 2022-09-17 DIAGNOSIS — G4733 Obstructive sleep apnea (adult) (pediatric): Secondary | ICD-10-CM | POA: Diagnosis not present

## 2022-09-17 DIAGNOSIS — G20C Parkinsonism, unspecified: Secondary | ICD-10-CM | POA: Insufficient documentation

## 2022-09-17 MED ORDER — ESCITALOPRAM OXALATE 10 MG PO TABS: 10.0000 mg | ORAL_TABLET | Freq: Every day | ORAL | 3 refills | Status: DC

## 2022-09-17 MED ORDER — DONEPEZIL HCL 10 MG PO TABS: 10.0000 mg | ORAL_TABLET | Freq: Every day | ORAL | 3 refills | Status: DC

## 2022-09-17 MED ORDER — MEMANTINE HCL 10 MG PO TABS: 10.0000 mg | ORAL_TABLET | Freq: Two times a day (BID) | ORAL | 3 refills | Status: DC

## 2022-09-17 MED ORDER — CARBIDOPA-LEVODOPA 25-100 MG PO TABS: ORAL_TABLET | ORAL | 3 refills | Status: DC

## 2022-09-17 NOTE — Progress Notes (Signed)
Patient: [name] Date of Birth: 18-Feb-1941  Reason for Visit: Follow up History from: Patient, wife Primary Neurologist: [facility]  ASSESSMENT AND PLAN 82 y.o. year old male   1.  Dementia 2.  Parkinsonism with tremor, gait difficulty 3.  OSA 4.  Depression  -MMSE 15/30, slight decline -Continue Aricept 10 mg daily, Namenda 10 mg twice daily, Lexapro 10 mg daily for mood -Continue Sinemet 25/100 mg 1 tablet in the morning, 1 tablet at noon, 2 in the evening, I do not see any tremor, seems to be moving fairly well, I would not increase the dose -He is not using CPAP, have discussed that his sleep study showed severe sleep apnea, risk associated with not using, I will pull a download in 1 month, I am not clear that he is motivated to restart, but we had a long discussion about the benefit it could provide -He is sedentary, his PCP has ordered PT, encouraged his wife to follow-up on this -He will follow-up in 6 months or sooner if needed  HISTORY OF PRESENT ILLNESS: Today 09/17/22 At last visit in November 2023 with Dr. Delena Bali, Sinemet was increased 25/100 mg to 1 tablet AM and Noon, 2 pills PM.  Continue Aricept 10 mg, Namenda 10 mg twice daily, Lexapro 10 mg daily. He is not using his CPAP, he doesn't want to use it. He wife is witnessing apneas, he wakes himself up coughing. Tremor is better. Daughter manages medications. He isn't active, watches TV and sleeps. Uses walker, no falls. He doesn't want to get up and move around much, He was hospitalized April 2024 for hypoxia. MMSE 15/30 today. Apparently briefly started on oxygen but told not needed, so returned it. PT ordered but hasn't been started. Doesn't feel unsteady. He feels like mood is good. His wife is frustrated.   HISTORY  03/20/22 Dr. Delena Bali Brief HPI: 82 year old male with a history of OSA, HTN, HLD, diabetes who follows in clinic for memory loss.    At his last visit, Lexapro was increased to 10 mg daily. He was started  on Sinemet trial for tremor and bradykinesia.   Interval History: PSG revealed OSA and he was started on CPAP. This has improved his daytime somnolence. Feels his memory has improved sightly since starting CPAP. Otherwise feels stable with no new concerns today. He is tolerating Namenda 10 mg BID and donepezil 10 mg daily without issues.   Feels his gait and balance have improved slightly. Tremor has improved though he will still notice it occasionally. Tolerates Sinemet well without issues.   Mood is stable on Lexapro 10 mg daily  REVIEW OF SYSTEMS: Out of a complete 14 system review of symptoms, the patient complains only of the following symptoms, and all other reviewed systems are negative.  See HPI  ALLERGIES: Allergies  Allergen Reactions   Ace Inhibitors Swelling    Benazepril   Allopurinol Rash    HOME MEDICATIONS: Outpatient Medications Prior to Visit  Medication Sig Dispense Refill   ACCU-CHEK AVIVA PLUS test strip USE AS INSTRUCTED TO CHECK BLOOD SUGAR THREE TIMES DAILY 300 each 1   albuterol (VENTOLIN HFA) 108 (90 Base) MCG/ACT inhaler Inhale 2 puffs into the lungs every 6 (six) hours as needed for wheezing or shortness of breath. 8 g 2   amLODipine (NORVASC) 10 MG tablet Take 1 tablet (10 mg total) by mouth daily. 30 tablet 1   aspirin 81 MG tablet Take 81 mg by mouth at bedtime.  atorvastatin (LIPITOR) 20 MG tablet Take 20 mg by mouth daily.     benzonatate (TESSALON) 100 MG capsule Take 1 capsule by mouth every 8 (eight) hours for cough. 21 capsule 0   Blood Glucose Monitoring Suppl (ACCU-CHEK AVIVA PLUS) w/Device KIT Use to check blood sugar 3 times per day dx code E11.65 1 kit 0   carbidopa-levodopa (SINEMET IR) 25-100 MG tablet Take 1 pill in the morning, 1 pill in the afternoon, and 2 pills at night (Patient taking differently: Take 1-2 tablets by mouth See admin instructions. Take 1 tablet by mouth in the morning, 1 tablet in the afternoon and 2 tablets at  night) 360 tablet 3   donepezil (ARICEPT) 10 MG tablet Take 1 tablet (10 mg total) by mouth at bedtime. 30 tablet 3   dorzolamide-timolol (COSOPT) 22.3-6.8 MG/ML ophthalmic solution Place 1 drop into both eyes 2 (two) times daily.      escitalopram (LEXAPRO) 10 MG tablet Take 1 tablet (10 mg total) by mouth daily. 90 tablet 3   ferrous sulfate 325 (65 FE) MG tablet Take 1 tablet (325 mg total) by mouth 2 (two) times daily. 60 tablet 3   fish oil-omega-3 fatty acids 1000 MG capsule Take 1 g by mouth daily.      GEMTESA 75 MG TABS Take 75 mg by mouth daily.     hydroxypropyl methylcellulose / hypromellose (ISOPTO TEARS / GONIOVISC) 2.5 % ophthalmic solution Place 1 drop into both eyes 4 (four) times daily as needed for dry eyes.      indomethacin (INDOCIN) 50 MG capsule Take 1 capsule (50 mg total) by mouth 2 (two) times daily as needed (gout). Take with food 20 capsule 1   Insulin Syringe-Needle U-100 (INSULIN SYRINGE .5CC/31GX5/16") 31G X 5/16" 0.5 ML MISC Use one to inject insulin daily 30 each 1   latanoprost (XALATAN) 0.005 % ophthalmic solution Place 1 drop into both eyes at bedtime.      meclizine (ANTIVERT) 25 MG tablet Take 1 tablet (25 mg total) by mouth 3 (three) times daily as needed for dizziness. 30 tablet 0   memantine (NAMENDA) 10 MG tablet Take 1 tablet (10 mg total) by mouth 2 (two) times daily. 180 tablet 3   metoprolol tartrate (LOPRESSOR) 25 MG tablet Take 1 tablet (25 mg total) by mouth 2 (two) times daily. 60 tablet 2   Multiple Vitamins-Minerals (CENTRUM SILVER ADULT 50+) TABS Take 1 tablet by mouth daily.     Multiple Vitamins-Minerals (PRESERVISION AREDS) TABS Take 1 capsule by mouth in the morning and at bedtime.     NOVOLIN 70/30 (70-30) 100 UNIT/ML injection 5u Subcutaneous Twice a day for 90 days     tamsulosin (FLOMAX) 0.4 MG CAPS capsule Take 1 capsule (0.4 mg total) by mouth at bedtime. 30 capsule 1   insulin regular (NOVOLIN R RELION) 100 units/mL injection Inject  0.05-0.15 mLs (5-15 Units total) into the skin 3 (three) times daily with meals as needed for high blood sugar (per sliding scale). 10 mL 2   metFORMIN (GLUCOPHAGE) 1000 MG tablet Take 1 tablet (1,000 mg total) by mouth 2 (two) times daily with a meal. 60 tablet 2   No facility-administered medications prior to visit.    PAST MEDICAL HISTORY: Past Medical History:  Diagnosis Date   Bladder cancer (HCC)    in situ   Cataracts, bilateral    Diabetes mellitus    insulin dependent   Gout    Hearing loss  Heart disease, unspecified    Hypercholesterolemia    Hypertension    Memory changes    Prostate cancer (HCC)    s/p radiation   Sarcoidosis    Unspecified glaucoma(365.9)     PAST SURGICAL HISTORY: Past Surgical History:  Procedure Laterality Date   BLADDER SURGERY     for carcinoma   CATARACT EXTRACTION Bilateral 2011   knee replacement surgery  1999   right   PROSTATECTOMY  2013    FAMILY HISTORY: Family History  Problem Relation Age of Onset   Hypertension Mother    Diabetes Mellitus I Mother    Hypertension Father    Coronary artery disease Father    Hypertension Sister    Hypertension Brother    Coronary artery disease Brother    Diabetes Mellitus I Brother    Diabetes Mellitus I Brother    Cancer Neg Hx     SOCIAL HISTORY: Social History   Socioeconomic History   Marital status: Married    Spouse name: Erskine Squibb   Number of children: 3   Years of education: 9   Highest education level: 9th grade  Occupational History    Comment: retired Radio broadcast assistant  Tobacco Use   Smoking status: Former    Packs/day: .5    Types: Cigarettes    Quit date: 04/21/2005    Years since quitting: 17.4   Smokeless tobacco: Never  Substance and Sexual Activity   Alcohol use: No   Drug use: No   Sexual activity: Not Currently  Other Topics Concern   Not on file  Social History Narrative   06/05/21 Lives with wife   Right handed   Caffeine: 1 C of coffee a day    Social Determinants of Health   Financial Resource Strain: Not on file  Food Insecurity: No Food Insecurity (04/26/2021)   Hunger Vital Sign    Worried About Running Out of Food in the Last Year: Never true    Ran Out of Food in the Last Year: Never true  Transportation Needs: No Transportation Needs (04/26/2021)   PRAPARE - Administrator, Civil Service (Medical): No    Lack of Transportation (Non-Medical): No  Physical Activity: Not on file  Stress: Not on file  Social Connections: Not on file  Intimate Partner Violence: Not on file    PHYSICAL EXAM  Vitals:   09/17/22 1248  BP: 136/74  Pulse: 60  Weight: 180 lb 8 oz (81.9 kg)  Height: 5\' 9"  (1.753 m)   Body mass index is 26.66 kg/m.    09/17/2022   12:54 PM 03/20/2022    3:46 PM 09/18/2021    1:42 PM  MMSE - Mini Mental State Exam  Orientation to time 2 5 2   Orientation to Place 4 4 4   Registration 3 3 2   Attention/ Calculation 0 0 0  Recall 0 0 1  Language- name 2 objects 2 2 2   Language- repeat 1 0 0  Language- follow 3 step command 3 2 3   Language- read & follow direction 0 0 1  Language-read & follow direction-comments unable to see  had to read it to him  Write a sentence 0 0 0  Copy design 0 0 0  Total score 15 16 15    Generalized: Well developed, in no acute distress, elderly male Neurological examination  Mentation: Alert, is quiet, wife provides most history.  He seems somewhat disinterested.  Follows all commands speech and language fluent Cranial nerve  II-XII: Pupils were equal round reactive to light. Extraocular movements were full, visual field were full on confrontational test. Facial sensation and strength were normal.  Head turning and shoulder shrug  were normal and symmetric. Motor: The motor testing reveals 5 over 5 strength of all 4 extremities. Good symmetric motor tone is noted throughout.  Mild bradykinesia with left arm. Sensory: Sensory testing is intact to soft touch on all 4  extremities. No evidence of extinction is noted.  Coordination: Cerebellar testing reveals good finger-nose-finger and heel-to-shin bilaterally.  I do not see any tremor. Gait and station: Has to push off from seated position, gait is wide-based, cautious, decreased arm swing on the right, stooped posture Reflexes: Deep tendon reflexes are symmetric but decreased  DIAGNOSTIC DATA (LABS, IMAGING, TESTING) - I reviewed patient records, labs, notes, testing and imaging myself where available.  Lab Results  Component Value Date   WBC 11.2 (H) 07/26/2022   HGB 12.9 (L) 07/26/2022   HCT 39.6 07/26/2022   MCV 75.7 (L) 07/26/2022   PLT 185 07/26/2022      Component Value Date/Time   NA 137 07/26/2022 1235   K 4.8 07/26/2022 1235   CL 101 07/26/2022 1235   CO2 25 07/26/2022 1235   GLUCOSE 194 (H) 07/26/2022 1235   BUN 18 07/26/2022 1235   CREATININE 1.52 (H) 07/26/2022 1235   CALCIUM 8.7 (L) 07/26/2022 1235   PROT 8.1 07/26/2022 1235   ALBUMIN 3.1 (L) 07/26/2022 1235   AST 24 07/26/2022 1235   ALT 8 07/26/2022 1235   ALKPHOS 62 07/26/2022 1235   BILITOT 1.1 07/26/2022 1235   GFRNONAA 46 (L) 07/26/2022 1235   GFRAA >60 10/30/2017 0437   Lab Results  Component Value Date   CHOL 96 07/23/2022   HDL 30 (L) 07/23/2022   LDLCALC 54 07/23/2022   TRIG 59 07/23/2022   CHOLHDL 3.2 07/23/2022   Lab Results  Component Value Date   HGBA1C 7.8 (H) 07/23/2022   Lab Results  Component Value Date   VITAMINB12 821 06/05/2021   Lab Results  Component Value Date   TSH 4.078 07/23/2022    Margie Ege, AGNP-C, DNP 09/17/2022, 1:04 PM [facility] [address], [address] [place], [place] [address] [phone]

## 2022-09-17 NOTE — Patient Instructions (Addendum)
Follow up on physical therapy Recommend restarting CPAP, this should help your sleep, energy level and daytime drowsiness. I will pull a download in 1 month Would recommend increasing activity  Follow up in 6 months   Meds ordered this encounter  Medications   donepezil (ARICEPT) 10 MG tablet    Sig: Take 1 tablet (10 mg total) by mouth at bedtime.    Dispense:  90 tablet    Refill:  3   carbidopa-levodopa (SINEMET IR) 25-100 MG tablet    Sig: Take 1 pill in the morning, 1 pill in the afternoon, and 2 pills at night    Dispense:  360 tablet    Refill:  3   escitalopram (LEXAPRO) 10 MG tablet    Sig: Take 1 tablet (10 mg total) by mouth daily.    Dispense:  90 tablet    Refill:  3   memantine (NAMENDA) 10 MG tablet    Sig: Take 1 tablet (10 mg total) by mouth 2 (two) times daily.    Dispense:  180 tablet    Refill:  3

## 2022-09-18 DIAGNOSIS — N189 Chronic kidney disease, unspecified: Secondary | ICD-10-CM | POA: Diagnosis not present

## 2022-09-18 DIAGNOSIS — E7800 Pure hypercholesterolemia, unspecified: Secondary | ICD-10-CM | POA: Diagnosis not present

## 2022-09-18 DIAGNOSIS — I10 Essential (primary) hypertension: Secondary | ICD-10-CM | POA: Diagnosis not present

## 2022-09-18 DIAGNOSIS — E1165 Type 2 diabetes mellitus with hyperglycemia: Secondary | ICD-10-CM | POA: Diagnosis not present

## 2022-09-23 DIAGNOSIS — C678 Malignant neoplasm of overlapping sites of bladder: Secondary | ICD-10-CM | POA: Diagnosis not present

## 2022-09-25 ENCOUNTER — Other Ambulatory Visit: Payer: Self-pay | Admitting: Urology

## 2022-10-14 DIAGNOSIS — I10 Essential (primary) hypertension: Secondary | ICD-10-CM | POA: Diagnosis not present

## 2022-10-14 DIAGNOSIS — E1169 Type 2 diabetes mellitus with other specified complication: Secondary | ICD-10-CM | POA: Diagnosis not present

## 2022-10-14 DIAGNOSIS — E119 Type 2 diabetes mellitus without complications: Secondary | ICD-10-CM | POA: Diagnosis not present

## 2022-10-14 DIAGNOSIS — N471 Phimosis: Secondary | ICD-10-CM | POA: Diagnosis not present

## 2022-10-14 DIAGNOSIS — F330 Major depressive disorder, recurrent, mild: Secondary | ICD-10-CM | POA: Diagnosis not present

## 2022-10-14 DIAGNOSIS — F0150 Vascular dementia without behavioral disturbance: Secondary | ICD-10-CM | POA: Diagnosis not present

## 2022-10-14 DIAGNOSIS — G214 Vascular parkinsonism: Secondary | ICD-10-CM | POA: Diagnosis not present

## 2022-10-14 DIAGNOSIS — Z01818 Encounter for other preprocedural examination: Secondary | ICD-10-CM | POA: Diagnosis not present

## 2022-10-14 DIAGNOSIS — H6122 Impacted cerumen, left ear: Secondary | ICD-10-CM | POA: Diagnosis not present

## 2022-10-14 DIAGNOSIS — E785 Hyperlipidemia, unspecified: Secondary | ICD-10-CM | POA: Diagnosis not present

## 2022-10-14 NOTE — Progress Notes (Signed)
COVID Vaccine received:  []  No [x]  Yes Date of any COVID positive Test in last 90 days:  PCP - Laurann Montana, MD at [facility]   [phone] Cardiologist - Donato Schultz, MD Pulmonology- Levy Pupa, MD Endocrinology- Dorisann Frames, MD  Chest x-ray - 07-22-2022  2v  Epic EKG -  07-28-2022  Epic Stress Test -  ECHO - 07-25-2022  Epic Cardiac Cath -   PCR screen: []  Ordered & Completed           []   No Order but Needs PROFEND           [x]   N/A for this surgery  Surgery Plan:  [x]  Ambulatory                            []  Outpatient in bed                            []  Admit  Anesthesia:    []  General  []  Spinal                           []   Choice [x]   MAC  Bowel Prep - [x]  No  []   Yes ______  Pacemaker / ICD device [x]  No []  Yes   Spinal Cord Stimulator:[x]  No []  Yes       History of Sleep Apnea? []  No [x]  Yes   CPAP used?- [x]  No []  Yes  ?  Does the patient monitor blood sugar?          []  No []  Yes  []  N/A Last A1c on 07-23-2022 was 7.8  Patient has: []  NO Hx DM   []  Pre-DM                 []  DM1  [x]   DM2 Does patient have a [employer] or Dexacom? [x]  No []  Yes   Fasting Blood Sugar Ranges-  Checks Blood Sugar _____ times a day Insulin Novolin 70/30- SS 5-12 units tid   Blood Thinner / Instructions:None Aspirin Instructions:  ASA 81mg    ERAS Protocol Ordered: [x]  No  []  Yes Patient is to be NPO after:   Comments:   Activity level: Patient is able / unable to climb a flight of stairs without difficulty; []  No CP  []  No SOB, but would have ___   Patient can / can not perform ADLs without assistance.   Anesthesia review: Parkinson's Dementia on meds, DM2, OSA- no CPAP, CHF, emphysema, sarcoidosis, HOH, Glaucoma, HTN,    Patient denies shortness of breath, fever, cough and chest pain at PAT appointment.  Patient verbalized understanding and agreement to the Pre-Surgical Instructions that were given to them at this PAT appointment. Patient was also educated of  the need to review these PAT instructions again prior to his/her surgery.I reviewed the appropriate phone numbers to call if they have any and questions or concerns.

## 2022-10-14 NOTE — Patient Instructions (Addendum)
SURGICAL WAITING ROOM VISITATION Patients having surgery or a procedure may have no more than 2 support people in the waiting area - these visitors may rotate in the visitor waiting room.   Due to an increase in RSV and influenza rates and associated hospitalizations, children ages 49 and under may not visit patients in [facility] hospitals. If the patient needs to stay at the hospital during part of their recovery, the visitor guidelines for inpatient rooms apply.  PRE-OP VISITATION  Pre-op nurse will coordinate an appropriate time for 1 support person to accompany the patient in pre-op.  This support person may not rotate.  This visitor will be contacted when the time is appropriate for the visitor to come back in the pre-op area.  Please refer to the [facility] website for the visitor guidelines for Inpatients (after your surgery is over and you are in a regular room).  You are not required to quarantine at this time prior to your surgery. However, you must do this: Hand Hygiene often Do NOT share personal items Notify your provider if you are in close contact with someone who has COVID or you develop fever 100.4 or greater, new onset of sneezing, cough, sore throat, shortness of breath or body aches.  If you test positive for Covid or have been in contact with anyone that has tested positive in the last 10 days please notify you surgeon.    Your procedure is scheduled on:  Wednesday  October 29, 2022  Report to [facility] Main Entrance: [address] entrance where the [employer] is available.   Report to admitting at: 10:15    AM  +++++Call this number if you have any questions or problems the morning of surgery [phone]  DO NOT EAT OR DRINK ANYTHING AFTER MIDNIGHT THE NIGHT PRIOR TO YOUR SURGERY / PROCEDURE.   FOLLOW BOWEL PREP AND ANY ADDITIONAL PRE OP INSTRUCTIONS YOU RECEIVED FROM YOUR SURGEON'S OFFICE!!!   Oral Hygiene is also important to reduce your risk of  infection.        Remember - BRUSH YOUR TEETH THE MORNING OF SURGERY WITH YOUR REGULAR TOOTHPASTE  Do NOT smoke after Midnight the night before surgery.   How to Manage Your Diabetes Before and After Surgery  Why is it important to control my blood sugar before and after surgery? Improving blood sugar levels before and after surgery helps healing and can limit problems. A way of improving blood sugar control is eating a healthy diet by:  Eating less sugar and carbohydrates  Increasing activity/exercise  Talking with your doctor about reaching your blood sugar goals High blood sugars (greater than 180 mg/dL) can raise your risk of infections and slow your recovery, so you will need to focus on controlling your diabetes during the weeks before surgery. Make sure that the doctor who takes care of your diabetes knows about your planned surgery including the date and location.  How do I manage my blood sugar before surgery? Check your blood sugar at least 4 times a day, starting 2 days before surgery, to make sure that the level is not too high or low. Check your blood sugar the morning of your surgery when you wake up and every 2 hours until you get to the Short Stay unit. If your blood sugar is less than 70 mg/dL, you will need to treat for low blood sugar: Do not take insulin. Treat a low blood sugar (less than 70 mg/dL) with  cup of  clear juice (cranberry or apple), 4 glucose tablets, OR glucose gel. Recheck blood sugar in 15 minutes after treatment (to make sure it is greater than 70 mg/dL). If your blood sugar is not greater than 70 mg/dL on recheck, call [phone] for further instructions. Report your blood sugar to the short stay nurse when you get to Short Stay.  If you are admitted to the hospital after surgery: Your blood sugar will be checked by the staff and you will probably be given insulin after surgery (instead of oral diabetes medicines) to make sure you have good blood  sugar levels. The goal for blood sugar control after surgery is 80-180 mg/dL.  WHAT DO I DO ABOUT MY DIABETES MEDICATION?  Call your Endocrinologist, Dr. Talmage Nap to get instructions for your Novolin 70/30  insulin.  THE DAY/ NIGHT BEFORE SURGERY,  THE MORNING OF SURGERY,   If your CBG is greater than 220 mg/dL, you may take  of your sliding scale  (correction) dose of insulin.    IF you have any questions, call the nurse at [phone]   Take ONLY these medicines the morning of surgery with A SIP OF WATER: Memantine (Namenda), Donepezil (Aricept), Probenecid (Benemid), Amlodipine, Metoprolol, Carbidopa-Levodopa (Sinemet), escitalopram (Lexapro), vibegron (Gemtesa).  He may use his Eye drops and inhalers if needed.    If You have been diagnosed with Sleep Apnea - Bring CPAP mask and tubing day of surgery. We will provide you with a CPAP machine on the day of your surgery.                   You may not have any metal on your body including hair pins, jewelry, and body piercing  Do not wear make-up, lotions, powders, perfumes / cologne, or deodorant  Do not wear nail polish including gel and S&S, artificial / acrylic nails, or any other type of covering on natural nails including finger and toenails. If you have artificial nails, gel coating, etc., that needs to be removed by a nail salon, Please have this removed prior to surgery. Not doing so may mean that your surgery could be cancelled or delayed if the Surgeon or anesthesia staff feels like they are unable to monitor you safely.   Do not shave 48 hours prior to surgery to avoid nicks in your skin which may contribute to postoperative infections.   Men may shave face and neck.  Contacts, Hearing Aids, dentures or bridgework may not be worn into surgery. DENTURES WILL BE REMOVED PRIOR TO SURGERY PLEASE DO NOT APPLY "Poly grip" OR ADHESIVES!!!  You may bring a small overnight bag with you on the day of surgery, only pack items that are  not valuable.  IS NOT RESPONSIBLE   FOR VALUABLES THAT ARE LOST OR STOLEN.   Patients discharged on the day of surgery will not be allowed to drive home.  Someone NEEDS to stay with you for the first 24 hours after anesthesia.  Do not bring your home medications to the hospital EXCEPT PLEASE BRING YOUR ALBUTEROL INHALER. . The Pharmacy will dispense medications listed on your medication list to you during your admission in the Hospital.  Special Instructions: Bring a copy of your healthcare power of attorney and living will documents the day of surgery, if you wish to have them scanned into your [facility] Records- EPIC  Please read over the following fact sheets you were given: IF YOU HAVE QUESTIONS ABOUT YOUR PRE-OP INSTRUCTIONS, PLEASE CALL [phone].  [facility] - Preparing for Surgery Before surgery, you can play an important role.  Because skin is not sterile, your skin needs to be as free of germs as possible.  You can reduce the number of germs on your skin by washing with CHG (chlorahexidine gluconate) soap before surgery.  CHG is an antiseptic cleaner which kills germs and bonds with the skin to continue killing germs even after washing. Please DO NOT use if you have an allergy to CHG or antibacterial soaps.  If your skin becomes reddened/irritated stop using the CHG and inform your nurse when you arrive at Short Stay. Do not shave (including legs and underarms) for at least 48 hours prior to the first CHG shower.  You may shave your face/neck.  Please follow these instructions carefully:  1.  Shower with CHG Soap the night before surgery and the  morning of surgery.  2.  If you choose to wash your hair, wash your hair first as usual with your normal  shampoo.  3.  After you shampoo, rinse your hair and body thoroughly to remove the shampoo.                             4.  Use CHG as you would any other liquid soap.  You can apply chg directly to the skin and  wash.  Gently with a scrungie or clean washcloth.  5.  Apply the CHG Soap to your body ONLY FROM THE NECK DOWN.   Do not use on face/ open                           Wound or open sores. Avoid contact with eyes, ears mouth and genitals (private parts).                       Wash face,  Genitals (private parts) with your normal soap.             6.  Wash thoroughly, paying special attention to the area where your  surgery  will be performed.  7.  Thoroughly rinse your body with warm water from the neck down.  8.  DO NOT shower/wash with your normal soap after using and rinsing off the CHG Soap.            9.  Pat yourself dry with a clean towel.            10.  Wear clean pajamas.            11.  Place clean sheets on your bed the night of your first shower and do not  sleep with pets.  ON THE DAY OF SURGERY : Do not apply any lotions/deodorants the morning of surgery.  Please wear clean clothes to the hospital/surgery center.    FAILURE TO FOLLOW THESE INSTRUCTIONS MAY RESULT IN THE CANCELLATION OF YOUR SURGERY  PATIENT SIGNATURE_________________________________  NURSE SIGNATURE__________________________________  ________________________________________________________________________

## 2022-10-16 ENCOUNTER — Other Ambulatory Visit: Payer: Self-pay

## 2022-10-16 ENCOUNTER — Encounter (HOSPITAL_COMMUNITY): Payer: Self-pay

## 2022-10-16 ENCOUNTER — Encounter (HOSPITAL_COMMUNITY)
Admission: RE | Admit: 2022-10-16 | Discharge: 2022-10-16 | Disposition: A | Discharge status: Home / Self Care | Payer: Medicare HMO | Source: Ambulatory Visit | Attending: Urology | Admitting: Urology

## 2022-10-16 VITALS — BP 146/67 | HR 63 | Temp 98.4°F | Resp 22 | Ht 69.0 in | Wt 180.0 lb

## 2022-10-16 DIAGNOSIS — Z794 Long term (current) use of insulin: Secondary | ICD-10-CM | POA: Diagnosis not present

## 2022-10-16 DIAGNOSIS — E1142 Type 2 diabetes mellitus with diabetic polyneuropathy: Secondary | ICD-10-CM | POA: Insufficient documentation

## 2022-10-16 DIAGNOSIS — Z01818 Encounter for other preprocedural examination: Secondary | ICD-10-CM | POA: Insufficient documentation

## 2022-10-16 DIAGNOSIS — I10 Essential (primary) hypertension: Secondary | ICD-10-CM | POA: Insufficient documentation

## 2022-10-16 DIAGNOSIS — E1169 Type 2 diabetes mellitus with other specified complication: Secondary | ICD-10-CM

## 2022-10-16 HISTORY — DX: Cerebral infarction, unspecified: I63.9

## 2022-10-16 HISTORY — DX: Chronic kidney disease, unspecified: N18.9

## 2022-10-16 HISTORY — DX: Unspecified dementia, unspecified severity, without behavioral disturbance, psychotic disturbance, mood disturbance, and anxiety: F03.90

## 2022-10-16 HISTORY — DX: Unspecified osteoarthritis, unspecified site: M19.90

## 2022-10-16 HISTORY — DX: Chronic obstructive pulmonary disease, unspecified: J44.9

## 2022-10-16 HISTORY — DX: Heart failure, unspecified: I50.9

## 2022-10-16 HISTORY — DX: Unspecified glaucoma: H40.9

## 2022-10-16 HISTORY — DX: Myoneural disorder, unspecified: G70.9

## 2022-10-16 LAB — CBC
HCT: 39.8 % (ref 39.0–52.0)
Hemoglobin: 12.6 g/dL — ABNORMAL LOW (ref 13.0–17.0)
MCH: 24.5 pg — ABNORMAL LOW (ref 26.0–34.0)
MCHC: 31.7 g/dL (ref 30.0–36.0)
MCV: 77.4 fL — ABNORMAL LOW (ref 80.0–100.0)
Platelets: 172 10*3/uL (ref 150–400)
RBC: 5.14 MIL/uL (ref 4.22–5.81)
RDW: 19.5 % — ABNORMAL HIGH (ref 11.5–15.5)
WBC: 6.6 10*3/uL (ref 4.0–10.5)
nRBC: 0 % (ref 0.0–0.2)

## 2022-10-16 LAB — BASIC METABOLIC PANEL
Anion gap: 11 (ref 5–15)
BUN: 18 mg/dL (ref 8–23)
CO2: 27 mmol/L (ref 22–32)
Calcium: 9.3 mg/dL (ref 8.9–10.3)
Chloride: 105 mmol/L (ref 98–111)
Creatinine, Ser: 1.38 mg/dL — ABNORMAL HIGH (ref 0.61–1.24)
GFR, Estimated: 52 mL/min — ABNORMAL LOW (ref >60)
Glucose, Bld: 129 mg/dL — ABNORMAL HIGH (ref 70–99)
Potassium: 3.6 mmol/L (ref 3.5–5.1)
Sodium: 143 mmol/L (ref 135–145)

## 2022-10-17 DIAGNOSIS — E1122 Type 2 diabetes mellitus with diabetic chronic kidney disease: Secondary | ICD-10-CM | POA: Diagnosis not present

## 2022-10-17 DIAGNOSIS — G214 Vascular parkinsonism: Secondary | ICD-10-CM | POA: Diagnosis not present

## 2022-10-17 DIAGNOSIS — E1159 Type 2 diabetes mellitus with other circulatory complications: Secondary | ICD-10-CM | POA: Diagnosis not present

## 2022-10-17 DIAGNOSIS — N182 Chronic kidney disease, stage 2 (mild): Secondary | ICD-10-CM | POA: Diagnosis not present

## 2022-10-17 DIAGNOSIS — I2510 Atherosclerotic heart disease of native coronary artery without angina pectoris: Secondary | ICD-10-CM | POA: Diagnosis not present

## 2022-10-17 DIAGNOSIS — F330 Major depressive disorder, recurrent, mild: Secondary | ICD-10-CM | POA: Diagnosis not present

## 2022-10-17 DIAGNOSIS — N471 Phimosis: Secondary | ICD-10-CM | POA: Diagnosis not present

## 2022-10-17 DIAGNOSIS — F0150 Vascular dementia without behavioral disturbance: Secondary | ICD-10-CM | POA: Diagnosis not present

## 2022-10-17 DIAGNOSIS — I152 Hypertension secondary to endocrine disorders: Secondary | ICD-10-CM | POA: Diagnosis not present

## 2022-10-17 LAB — HEMOGLOBIN A1C
Hgb A1c MFr Bld: 9.6 % — ABNORMAL HIGH (ref 4.8–5.6)
Mean Plasma Glucose: 229 mg/dL

## 2022-10-21 ENCOUNTER — Telehealth: Payer: Self-pay | Admitting: Neurology

## 2022-10-21 NOTE — Telephone Encounter (Signed)
Please call, I pulled CPAP data. Looks like trying to use. Used 29/30 days, but needs to improve using > 4 hours nightly. There is some mask leak 32.3, AHI 3.1. if he notices mask leaking we can get a refit, otherwise this data may improve with longer nightly usage. Would encourage him to continue with CPAP. Thanks

## 2022-10-22 DIAGNOSIS — F0150 Vascular dementia without behavioral disturbance: Secondary | ICD-10-CM | POA: Diagnosis not present

## 2022-10-22 DIAGNOSIS — E1122 Type 2 diabetes mellitus with diabetic chronic kidney disease: Secondary | ICD-10-CM | POA: Diagnosis not present

## 2022-10-22 DIAGNOSIS — G214 Vascular parkinsonism: Secondary | ICD-10-CM | POA: Diagnosis not present

## 2022-10-22 DIAGNOSIS — N471 Phimosis: Secondary | ICD-10-CM | POA: Diagnosis not present

## 2022-10-22 DIAGNOSIS — E1159 Type 2 diabetes mellitus with other circulatory complications: Secondary | ICD-10-CM | POA: Diagnosis not present

## 2022-10-22 DIAGNOSIS — N182 Chronic kidney disease, stage 2 (mild): Secondary | ICD-10-CM | POA: Diagnosis not present

## 2022-10-22 DIAGNOSIS — I152 Hypertension secondary to endocrine disorders: Secondary | ICD-10-CM | POA: Diagnosis not present

## 2022-10-22 DIAGNOSIS — I2510 Atherosclerotic heart disease of native coronary artery without angina pectoris: Secondary | ICD-10-CM | POA: Diagnosis not present

## 2022-10-22 DIAGNOSIS — F330 Major depressive disorder, recurrent, mild: Secondary | ICD-10-CM | POA: Diagnosis not present

## 2022-10-22 NOTE — Telephone Encounter (Signed)
Called and spoke to pt wife and relayed results she voiced understanding she stated that she tries to get him to be compliant but he keeps taking it off. I encouraged her to stay on him about wearing it.

## 2022-10-24 DIAGNOSIS — E1122 Type 2 diabetes mellitus with diabetic chronic kidney disease: Secondary | ICD-10-CM | POA: Diagnosis not present

## 2022-10-24 DIAGNOSIS — G214 Vascular parkinsonism: Secondary | ICD-10-CM | POA: Diagnosis not present

## 2022-10-24 DIAGNOSIS — F330 Major depressive disorder, recurrent, mild: Secondary | ICD-10-CM | POA: Diagnosis not present

## 2022-10-24 DIAGNOSIS — E1159 Type 2 diabetes mellitus with other circulatory complications: Secondary | ICD-10-CM | POA: Diagnosis not present

## 2022-10-24 DIAGNOSIS — I2510 Atherosclerotic heart disease of native coronary artery without angina pectoris: Secondary | ICD-10-CM | POA: Diagnosis not present

## 2022-10-24 DIAGNOSIS — N471 Phimosis: Secondary | ICD-10-CM | POA: Diagnosis not present

## 2022-10-24 DIAGNOSIS — F0150 Vascular dementia without behavioral disturbance: Secondary | ICD-10-CM | POA: Diagnosis not present

## 2022-10-24 DIAGNOSIS — I152 Hypertension secondary to endocrine disorders: Secondary | ICD-10-CM | POA: Diagnosis not present

## 2022-10-24 DIAGNOSIS — N182 Chronic kidney disease, stage 2 (mild): Secondary | ICD-10-CM | POA: Diagnosis not present

## 2022-10-24 NOTE — Progress Notes (Signed)
Case: [id number] Date/Time: 10/29/22 1220   Procedure: CIRCUMCISION ADULT - 60 MINUTES   Anesthesia type: Monitor Anesthesia Care   Pre-op diagnosis: PHIMOSIS   Location: [facility] ROOM 01 / [facility]   Surgeons: Crist Fat, MD       DISCUSSION: [name] is a 82 yo male who presents to PAT prior to surgery above. PMH significant for prior smoking, HTN, CHF, aortic atherosclerosis, insulin dependent DM, COPD, sarcoidosis, OSA (does not use CPAP), CKD, Parkinsonism, dementia, hx of bladder cancer and prostate cancer s/p prostatectomy and radiation. He has had difficulty with phimosis with continued difficulty with nocturia and urinary frequency so scheduled for circumcision.  No prior anesthesia complications.   Was admitted in April for abdominal pain, N/V/D and hypoxia. No clear diagnosis but symptoms improved with supportive care. He was discharged home with O2 but has since discontinued this. He follows with Pulmonology for COPD, OSA, and presumed diagnosis of sarcoidosis. Respiratory status has been stable although he inconsistently uses CPAP for severe OSA.  Previously followed with Cardiology for aortic atherosclerosis (on statin). Reportedly had a cath years ago without intervention but unable to find these records. Updated echo during admission in April shows grade 1 DD with normal EF. Cleared by PCP for procedure on 10/13/32:  "Patient is stable so long as BP is under control. Will verify this over the next few days, once ensured clearance will be given. He is aware to stop low-dose aspirin 5 days prior to surgery. "   VS: BP (!) 146/67 Comment: left arm sitting  Pulse 63   Temp 36.9 C (Oral)   Resp (!) 22   Ht 5\' 9"  (1.753 m)   Wt 81.6 kg   SpO2 96%   BMI 26.58 kg/m   PROVIDERS: PCP - Laurann Montana, MD at [facility]   [phone] Cardiologist - Donato Schultz, MD Pulmonology- Levy Pupa, MD Endocrinology- Dorisann Frames, MD  LABS: Labs reviewed: Acceptable for  surgery. Creatinine elevated, consistent with baseline. Hgb stable. (all labs ordered are listed, but only abnormal results are displayed)  Labs Reviewed  BASIC METABOLIC PANEL - Abnormal; Notable for the following components:      Result Value   Glucose, Bld 129 (*)    Creatinine, Ser 1.38 (*)    GFR, Estimated 52 (*)    All other components within normal limits  CBC - Abnormal; Notable for the following components:   Hemoglobin 12.6 (*)    MCV 77.4 (*)    MCH 24.5 (*)    RDW 19.5 (*)    All other components within normal limits  HEMOGLOBIN A1C - Abnormal; Notable for the following components:   Hgb A1c MFr Bld 9.6 (*)    All other components within normal limits     IMAGES: n/a   EKG: n/a   CV:  Echo 07/24/22:  IMPRESSIONS     1. Left ventricular ejection fraction, by estimation, is 50 to 55%. Left  ventricular ejection fraction by 3D volume is 51 %. The left ventricle has  low normal function. The left ventricle has no regional wall motion  abnormalities. There is mild  concentric left ventricular hypertrophy. Left ventricular diastolic  parameters are consistent with Grade I diastolic dysfunction (impaired  relaxation).   2. Right ventricular systolic function is normal. The right ventricular  size is normal. There is normal pulmonary artery systolic pressure.   3. Left atrial size was severely dilated.   4. The mitral valve is myxomatous. No  evidence of mitral valve  regurgitation. No evidence of mitral stenosis.   5. The aortic valve is tricuspid. There is mild calcification of the  aortic valve. There is mild thickening of the aortic valve. Aortic valve  regurgitation is mild. No aortic stenosis is present.   6. The inferior vena cava is normal in size with greater than 50%  respiratory variability, suggesting right atrial pressure of 3 mmHg.    Past Medical History:  Diagnosis Date   Arthritis    Bladder cancer (HCC)    in situ   Cataracts, bilateral     CHF (congestive heart failure) (HCC)    Chronic kidney disease    COPD (chronic obstructive pulmonary disease) (HCC)    Dementia (HCC)    Diabetes mellitus    insulin dependent   Glaucoma    Gout    Hearing loss    Heart disease, unspecified    Hypercholesterolemia    Hypertension    Memory changes    Neuromuscular disorder (HCC)    parkinson's   Prostate cancer (HCC)    s/p radiation   Sarcoidosis    Stroke (HCC)    Unspecified glaucoma(365.9)     Past Surgical History:  Procedure Laterality Date   BLADDER SURGERY     for carcinoma   CATARACT EXTRACTION Bilateral 2011   EYE SURGERY Bilateral    pressure surgery related to glaucoma   knee replacement surgery  1999   right   PROSTATECTOMY  2013    MEDICATIONS:  ACCU-CHEK AVIVA PLUS test strip   acetaminophen (TYLENOL) 325 MG tablet   albuterol (VENTOLIN HFA) 108 (90 Base) MCG/ACT inhaler   amLODipine (NORVASC) 10 MG tablet   aspirin 81 MG tablet   atorvastatin (LIPITOR) 20 MG tablet   Blood Glucose Monitoring Suppl (ACCU-CHEK AVIVA PLUS) w/Device KIT   carbidopa-levodopa (SINEMET IR) 25-100 MG tablet   donepezil (ARICEPT) 10 MG tablet   dorzolamide-timolol (COSOPT) 22.3-6.8 MG/ML ophthalmic solution   escitalopram (LEXAPRO) 10 MG tablet   ferrous sulfate 325 (65 FE) MG tablet   fish oil-omega-3 fatty acids 1000 MG capsule   GEMTESA 75 MG TABS   guaifenesin (ROBITUSSIN) 100 MG/5ML syrup   indomethacin (INDOCIN) 50 MG capsule   Insulin Syringe-Needle U-100 (INSULIN SYRINGE .5CC/31GX5/16") 31G X 5/16" 0.5 ML MISC   latanoprost (XALATAN) 0.005 % ophthalmic solution   loperamide (IMODIUM A-D) 2 MG tablet   meclizine (ANTIVERT) 25 MG tablet   memantine (NAMENDA) 10 MG tablet   metoprolol tartrate (LOPRESSOR) 50 MG tablet   Multiple Vitamins-Minerals (CENTRUM SILVER ADULT 50+) TABS   Multiple Vitamins-Minerals (PRESERVISION AREDS) TABS   NOVOLIN 70/30 (70-30) 100 UNIT/ML injection   probenecid (BENEMID) 500 MG  tablet   tamsulosin (FLOMAX) 0.4 MG CAPS capsule   No current facility-administered medications for this encounter.   Marcille Blanco [facility] Short Stay/Anesthesiology [facility] Phone [phone] 10/24/2022 9:53 AM

## 2022-10-24 NOTE — Anesthesia Preprocedure Evaluation (Signed)
Anesthesia Evaluation  Patient identified by MRN, date of birth, ID band Patient awake    Reviewed: Allergy & Precautions, NPO status , Patient's Chart, lab work & pertinent test results  Airway Mallampati: III  TM Distance: >3 FB Neck ROM: Limited    Dental  (+) Dental Advisory Given   Pulmonary sleep apnea and Continuous Positive Airway Pressure Ventilation , COPD,  COPD inhaler, former smoker Sarcoidosis    breath sounds clear to auscultation       Cardiovascular hypertension, Pt. on medications and Pt. on home beta blockers +CHF   Rhythm:Regular Rate:Normal     Neuro/Psych  Neuromuscular disease CVA    GI/Hepatic negative GI ROS, Neg liver ROS,,,  Endo/Other  diabetes, Type 2, Insulin Dependent    Renal/GU Renal InsufficiencyRenal disease     Musculoskeletal  (+) Arthritis ,    Abdominal   Peds  Hematology  (+) Blood dyscrasia, anemia   Anesthesia Other Findings   Reproductive/Obstetrics                             Anesthesia Physical Anesthesia Plan  ASA: 3  Anesthesia Plan: MAC   Post-op Pain Management: Tylenol PO (pre-op)*   Induction:   PONV Risk Score and Plan: 1 and Propofol infusion, Ondansetron and Treatment may vary due to age or medical condition  Airway Management Planned: Natural Airway and Simple Face Mask  Additional Equipment:   Intra-op Plan:   Post-operative Plan:   Informed Consent: I have reviewed the patients History and Physical, chart, labs and discussed the procedure including the risks, benefits and alternatives for the proposed anesthesia with the patient or authorized representative who has indicated his/her understanding and acceptance.     Dental advisory given  Plan Discussed with: CRNA  Anesthesia Plan Comments: ( )        Anesthesia Quick Evaluation

## 2022-10-27 DIAGNOSIS — N471 Phimosis: Secondary | ICD-10-CM | POA: Diagnosis not present

## 2022-10-27 DIAGNOSIS — E1122 Type 2 diabetes mellitus with diabetic chronic kidney disease: Secondary | ICD-10-CM | POA: Diagnosis not present

## 2022-10-27 DIAGNOSIS — N182 Chronic kidney disease, stage 2 (mild): Secondary | ICD-10-CM | POA: Diagnosis not present

## 2022-10-27 DIAGNOSIS — E1159 Type 2 diabetes mellitus with other circulatory complications: Secondary | ICD-10-CM | POA: Diagnosis not present

## 2022-10-27 DIAGNOSIS — I152 Hypertension secondary to endocrine disorders: Secondary | ICD-10-CM | POA: Diagnosis not present

## 2022-10-27 DIAGNOSIS — F330 Major depressive disorder, recurrent, mild: Secondary | ICD-10-CM | POA: Diagnosis not present

## 2022-10-27 DIAGNOSIS — I2510 Atherosclerotic heart disease of native coronary artery without angina pectoris: Secondary | ICD-10-CM | POA: Diagnosis not present

## 2022-10-27 DIAGNOSIS — F0150 Vascular dementia without behavioral disturbance: Secondary | ICD-10-CM | POA: Diagnosis not present

## 2022-10-27 DIAGNOSIS — G214 Vascular parkinsonism: Secondary | ICD-10-CM | POA: Diagnosis not present

## 2022-10-29 ENCOUNTER — Encounter (HOSPITAL_COMMUNITY): Payer: Self-pay | Admitting: Urology

## 2022-10-29 ENCOUNTER — Ambulatory Visit (HOSPITAL_BASED_OUTPATIENT_CLINIC_OR_DEPARTMENT_OTHER): Payer: Medicare HMO | Admitting: Anesthesiology

## 2022-10-29 ENCOUNTER — Encounter (HOSPITAL_COMMUNITY)
Admission: RE | Disposition: A | Discharge status: Home / Self Care | Payer: Self-pay | Source: Ambulatory Visit | Attending: Urology

## 2022-10-29 ENCOUNTER — Ambulatory Visit (HOSPITAL_COMMUNITY)
Admission: RE | Admit: 2022-10-29 | Discharge: 2022-10-29 | Disposition: A | Discharge status: Home / Self Care | Payer: Medicare HMO | Source: Ambulatory Visit | Attending: Urology | Admitting: Urology

## 2022-10-29 ENCOUNTER — Ambulatory Visit (HOSPITAL_COMMUNITY): Payer: Medicare HMO | Admitting: Physician Assistant

## 2022-10-29 DIAGNOSIS — Z87891 Personal history of nicotine dependence: Secondary | ICD-10-CM | POA: Diagnosis not present

## 2022-10-29 DIAGNOSIS — D869 Sarcoidosis, unspecified: Secondary | ICD-10-CM | POA: Diagnosis not present

## 2022-10-29 DIAGNOSIS — G4730 Sleep apnea, unspecified: Secondary | ICD-10-CM | POA: Insufficient documentation

## 2022-10-29 DIAGNOSIS — N3281 Overactive bladder: Secondary | ICD-10-CM | POA: Diagnosis not present

## 2022-10-29 DIAGNOSIS — Z794 Long term (current) use of insulin: Secondary | ICD-10-CM | POA: Diagnosis not present

## 2022-10-29 DIAGNOSIS — Z8673 Personal history of transient ischemic attack (TIA), and cerebral infarction without residual deficits: Secondary | ICD-10-CM | POA: Insufficient documentation

## 2022-10-29 DIAGNOSIS — Z8546 Personal history of malignant neoplasm of prostate: Secondary | ICD-10-CM | POA: Diagnosis not present

## 2022-10-29 DIAGNOSIS — Z7985 Long-term (current) use of injectable non-insulin antidiabetic drugs: Secondary | ICD-10-CM | POA: Diagnosis not present

## 2022-10-29 DIAGNOSIS — E119 Type 2 diabetes mellitus without complications: Secondary | ICD-10-CM | POA: Diagnosis not present

## 2022-10-29 DIAGNOSIS — I509 Heart failure, unspecified: Secondary | ICD-10-CM | POA: Diagnosis not present

## 2022-10-29 DIAGNOSIS — J449 Chronic obstructive pulmonary disease, unspecified: Secondary | ICD-10-CM | POA: Insufficient documentation

## 2022-10-29 DIAGNOSIS — N471 Phimosis: Secondary | ICD-10-CM

## 2022-10-29 DIAGNOSIS — I110 Hypertensive heart disease with heart failure: Secondary | ICD-10-CM | POA: Insufficient documentation

## 2022-10-29 DIAGNOSIS — Z8551 Personal history of malignant neoplasm of bladder: Secondary | ICD-10-CM | POA: Insufficient documentation

## 2022-10-29 DIAGNOSIS — D649 Anemia, unspecified: Secondary | ICD-10-CM | POA: Insufficient documentation

## 2022-10-29 DIAGNOSIS — I130 Hypertensive heart and chronic kidney disease with heart failure and stage 1 through stage 4 chronic kidney disease, or unspecified chronic kidney disease: Secondary | ICD-10-CM | POA: Diagnosis not present

## 2022-10-29 DIAGNOSIS — N189 Chronic kidney disease, unspecified: Secondary | ICD-10-CM | POA: Diagnosis not present

## 2022-10-29 HISTORY — PX: CIRCUMCISION: SHX1350

## 2022-10-29 LAB — GLUCOSE, CAPILLARY
Glucose-Capillary: 173 mg/dL — ABNORMAL HIGH (ref 70–99)
Glucose-Capillary: 170 mg/dL — ABNORMAL HIGH (ref 70–99)
Glucose-Capillary: 145 mg/dL — ABNORMAL HIGH (ref 70–99)

## 2022-10-29 SURGERY — CIRCUMCISION, ADULT
Anesthesia: Monitor Anesthesia Care

## 2022-10-29 MED ORDER — ONDANSETRON HCL 4 MG/2ML IJ SOLN
INTRAMUSCULAR | Status: DC | PRN
  Administered 2022-10-29: 4 mg via INTRAVENOUS

## 2022-10-29 MED ORDER — ACETAMINOPHEN 500 MG PO TABS: 1000.0000 mg | ORAL_TABLET | Freq: Once | ORAL | Status: DC

## 2022-10-29 MED ORDER — FENTANYL CITRATE PF 50 MCG/ML IJ SOSY: 25.0000 ug | PREFILLED_SYRINGE | INTRAMUSCULAR | Status: DC | PRN

## 2022-10-29 MED ORDER — ORAL CARE MOUTH RINSE: 15.0000 mL | Freq: Once | OROMUCOSAL | Status: AC

## 2022-10-29 MED ORDER — LIDOCAINE HCL 1 % IJ SOLN
INTRAMUSCULAR | Status: AC
  Filled 2022-10-29: qty 20

## 2022-10-29 MED ORDER — DEXAMETHASONE SODIUM PHOSPHATE 4 MG/ML IJ SOLN
INTRAMUSCULAR | Status: DC | PRN
  Administered 2022-10-29: 4 mg via INTRAVENOUS

## 2022-10-29 MED ORDER — LIDOCAINE HCL (PF) 1 % IJ SOLN
INTRAMUSCULAR | Status: DC | PRN
  Administered 2022-10-29: 5 mL

## 2022-10-29 MED ORDER — FENTANYL CITRATE (PF) 100 MCG/2ML IJ SOLN
INTRAMUSCULAR | Status: AC
  Filled 2022-10-29: qty 2

## 2022-10-29 MED ORDER — PROPOFOL 10 MG/ML IV BOLUS
INTRAVENOUS | Status: DC | PRN
  Administered 2022-10-29: 20 mg via INTRAVENOUS

## 2022-10-29 MED ORDER — BACITRACIN-NEOMYCIN-POLYMYXIN OINTMENT TUBE
TOPICAL_OINTMENT | CUTANEOUS | Status: AC
  Filled 2022-10-29: qty 14.17

## 2022-10-29 MED ORDER — TRAMADOL HCL 50 MG PO TABS: 50.0000 mg | ORAL_TABLET | Freq: Four times a day (QID) | ORAL | 0 refills | Status: DC | PRN

## 2022-10-29 MED ORDER — AMISULPRIDE (ANTIEMETIC) 5 MG/2ML IV SOLN: 10.0000 mg | Freq: Once | INTRAVENOUS | Status: DC | PRN

## 2022-10-29 MED ORDER — PROPOFOL 10 MG/ML IV BOLUS
INTRAVENOUS | Status: AC
  Filled 2022-10-29: qty 20

## 2022-10-29 MED ORDER — LACTATED RINGERS IV SOLN: INTRAVENOUS | Status: DC

## 2022-10-29 MED ORDER — FENTANYL CITRATE (PF) 100 MCG/2ML IJ SOLN
INTRAMUSCULAR | Status: DC | PRN
  Administered 2022-10-29 (×2): 50 ug via INTRAVENOUS

## 2022-10-29 MED ORDER — PROPOFOL 500 MG/50ML IV EMUL
INTRAVENOUS | Status: DC | PRN
  Administered 2022-10-29: 75 ug/kg/min via INTRAVENOUS

## 2022-10-29 MED ORDER — INSULIN ASPART 100 UNIT/ML IJ SOLN: 0.0000 [IU] | INTRAMUSCULAR | Status: DC | PRN

## 2022-10-29 MED ORDER — CHLORHEXIDINE GLUCONATE 0.12 % MT SOLN
15.0000 mL | Freq: Once | OROMUCOSAL | Status: AC
  Administered 2022-10-29: 15 mL via OROMUCOSAL

## 2022-10-29 MED ORDER — CEFAZOLIN SODIUM-DEXTROSE 2-4 GM/100ML-% IV SOLN
2.0000 g | INTRAVENOUS | Status: AC
  Administered 2022-10-29: 2 g via INTRAVENOUS
  Filled 2022-10-29: qty 100

## 2022-10-29 MED ORDER — BACITRA-NEOMYCIN-POLYMYXIN-HC 1 % OP OINT
TOPICAL_OINTMENT | OPHTHALMIC | Status: DC | PRN
  Administered 2022-10-29: 1

## 2022-10-29 SURGICAL SUPPLY — 32 items
BAG COUNTER SPONGE SURGICOUNT (BAG) IMPLANT
BAG SPNG CNTER NS LX DISP (BAG)
BLADE SURG 15 STRL LF DISP TIS (BLADE) ×1 IMPLANT
BLADE SURG 15 STRL SS (BLADE) ×1
BNDG CMPR 75X21 PLY HI ABS (MISCELLANEOUS) ×1
BNDG COHESIVE 1X5 TAN STRL LF (GAUZE/BANDAGES/DRESSINGS) ×1 IMPLANT
COVER SURGICAL LIGHT HANDLE (MISCELLANEOUS) ×1 IMPLANT
DRAPE LAPAROTOMY T 98X78 PEDS (DRAPES) ×1 IMPLANT
ELECT REM PT RETURN 15FT ADLT (MISCELLANEOUS) ×1 IMPLANT
GAUZE 4X4 16PLY ~~LOC~~+RFID DBL (SPONGE) ×1 IMPLANT
GAUZE PETROLATUM 1 X8 (GAUZE/BANDAGES/DRESSINGS) ×1 IMPLANT
GAUZE STRETCH 2X75IN STRL (MISCELLANEOUS) ×1 IMPLANT
GLOVE SURG LX STRL 7.5 STRW (GLOVE) ×1 IMPLANT
GOWN STRL REUS W/ TWL LRG LVL3 (GOWN DISPOSABLE) ×2 IMPLANT
GOWN STRL REUS W/ TWL XL LVL3 (GOWN DISPOSABLE) ×1 IMPLANT
GOWN STRL REUS W/TWL LRG LVL3 (GOWN DISPOSABLE) ×2
GOWN STRL REUS W/TWL XL LVL3 (GOWN DISPOSABLE) ×1
KIT BASIN OR (CUSTOM PROCEDURE TRAY) ×1 IMPLANT
KIT TURNOVER KIT A (KITS) IMPLANT
NDL HYPO 22X1.5 SAFETY MO (MISCELLANEOUS) ×1 IMPLANT
NEEDLE HYPO 22X1.5 SAFETY MO (MISCELLANEOUS) ×1 IMPLANT
NS IRRIG 1000ML POUR BTL (IV SOLUTION) IMPLANT
PACK BASIC VI WITH GOWN DISP (CUSTOM PROCEDURE TRAY) ×1 IMPLANT
PENCIL SMOKE EVACUATOR (MISCELLANEOUS) IMPLANT
SUT CHROMIC 3 0 SH 27 (SUTURE) IMPLANT
SUT CHROMIC 4 0 SH 27 (SUTURE) IMPLANT
SUT VIC AB 3-0 SH 18 (SUTURE) IMPLANT
SUT VIC AB 4-0 SH 18 (SUTURE) IMPLANT
SYR CONTROL 10ML LL (SYRINGE) IMPLANT
TOWEL OR 17X26 10 PK STRL BLUE (TOWEL DISPOSABLE) ×1 IMPLANT
TOWEL OR NON WOVEN STRL DISP B (DISPOSABLE) ×1 IMPLANT
WATER STERILE IRR 1000ML POUR (IV SOLUTION) IMPLANT

## 2022-10-29 NOTE — Op Note (Addendum)
Preoperative diagnosis:  phimosis   Postoperative diagnosis:  Same   Procedure: Circumcision  Surgeon: Crist Fat, MD  Assistant: Jerald Kief, MD, PhD  Anesthesia: General  Complications: None  Intraoperative findings: tight phimotic ring, inflamed glans with blood blister  EBL: Minimal  Specimens: None  Indication: [name] is a 82 y.o. patient with phimosis and unable to retract his foreskin causing recurrent infections and/or poor hygiene.  After reviewing the management options for treatment, he elected to proceed with the above surgical procedure(s). We have discussed the potential benefits and risks of the procedure, side effects of the proposed treatment, the likelihood of the patient achieving the goals of the procedure, and any potential problems that might occur during the procedure or recuperation. Informed consent has been obtained.  Procedure:  After general anesthesia was induced the patient was prepped and draped in the routine sterile fashion. A penile block was then performed with local anesthetic. The foreskin was then opened enough to allow retraction over the glans penis. The prepuce was then marked so as to provide a proximately 5 mm collar.  The foreskin was then reduced over the glans penis and marked so as to allow a nice fit for the reanastomosis of the skin to the collar.  A 15 blade was then used to make a circumferential incision through the dermis of both marks. A pair of Metzenbaum scissors was then tunneled through the incisions and the foreskin opened on top of the scissors using electrocautery. The foreskin was then removed circumferentially. The underlying tissue was then cauterized and all bleeding stopped.  The penile skin was then approximated to the collar in 4 quadrants using a 3-0 Vicryl. The frenulum was attached with a U stitch in the dorsal surface. The remaining skin was then closed in simple interrupted fashion.    Vaseline was applied to the penis. The penis was wrapped with 2 inch Kerlix dressing gently followed by brown coban.  The patient was subsequently awoken and returned to PACU in excellent condition. There no immediate complications.

## 2022-10-29 NOTE — Interval H&P Note (Signed)
History and Physical Interval Note:  10/29/2022 2:38 PM  [name]  has presented today for surgery, with the diagnosis of PHIMOSIS.  The various methods of treatment have been discussed with the patient and family. After consideration of risks, benefits and other options for treatment, the patient has consented to  Procedure(s) with comments: CIRCUMCISION ADULT (N/A) - 60 MINUTES as a surgical intervention.  The patient's history has been reviewed, patient examined, no change in status, stable for surgery.  I have reviewed the patient's chart and labs.  Questions were answered to the patient's satisfaction.     Crist Fat

## 2022-10-29 NOTE — H&P (Signed)
f/u for transitional cell carcinoma  HPI: [name] is a 82 year-old male established patient who is here for surveillance of bladder cancer.  He underwent a TURBT. His last bladder tumor was resected 11/05/2010. He has had the a total of 2 bladder resections.   Tumor Pathology: CIS.   The patient did not have any post-operative bladder instillations.   His last cysto was approximately 09/19/2021. His cystoscopy demonstrated NED.   His last radiologic test to evaluate the kidneys was 11/05/2010. The patient did not have labs prior to his office visit today.   He is not having pain in new locations. He has not had blood in his urine recently. He has not recently had unwanted weight loss.   [name] had IMRT from 04/11/10 through 06/10/10 for adenocarcinoma of prostate Gleason 4+3 and PSA of 3.6.  PSA: 04/2014: 0.04, 7/17: 0.04, 7/18: 0.049, 8/19: 0.047, 9/20: 0.051, 9/21: 0.035, 5/22: 0.031, 5/23: 0.033   Takes Tamsulosin for his voiding symptoms. He is now getting up 4-5 times per night. He does endorse severe snoring, left lower extremity swelling, and fluid intake up until bedtime and throughout the night. He states that he wakes up quite often, wonders if he is awake and then needs to void or if he wakes up because he has to void. He denies any hematuria or dysuria.   Erections are non-issue.  OAB - failed Myrbetriq, Oxybutynin, Sanctura, Gemtesa and PTNS.   Intv: The patient returns today for follow-up after 6 weeks. We doubled his tamsulosin and added Vesicare to [place]. Since doing that he has had very little improvement or change. He continues to have significant incontinence and nocturia. No medication has been helpful for him. PTNS is also not been helpful for him. He denies any dysuria. He was recently hospitalized for left-sided abdominal pain and there were no significant findings. He has been more constipated recently.     ALLERGIES: No Allergies    MEDICATIONS: Tamsulosin  Hcl 0.4 mg capsule TAKE 1 CAPSULE EVERY DAY  Amlodipine Besy-Benazepril HCl - 10-20 MG Oral Capsule Oral  Aspirin Ec 81 mg tablet, delayed release  Cosopt 22.3 mg-6.8 mg/ml drops  Cpap  Donepezil Hcl 10 mg tablet  Escitalopram Oxalate 5 mg tablet  Ferrous Sulfate 325 mg (65 mg iron) tablet 0 Oral 3 times daily  Fish Oil CAPS Oral  Humulin 70-30 100 unit/ml (70-30) insulin pen Subcutaneous  Indomethacin 50 mg capsule  Indomethacin 50 mg capsule  Iron 325 mg (65 mg iron) tablet  Latanoprost 0.005 % drops  Metoprolol Succinate ER 25 MG Oral Tablet Extended Release 24 Hour Oral  One Touch Ultra Blood Glucose Test  Simvastatin 40 MG Oral Tablet Oral  Travatan Z 0.004 % drops Ophthalmic  Trospium Chloride 20 mg tablet 2 tablet PO Q HS  Victoza     GU PSH: Cysto Bladder Ureth Biopsy - 2012, 2012 Cystoscopy - 06/24/2022, 09/03/2021, 2022, 2021, 2020, 2019, 2018, 2017 Cystoscopy Fulguration - 2012       PSH Notes: Cystoscopy With Fulguration, Cystoscopy With Biopsy, Cystoscopy With Biopsy, Knee Replacement   NON-GU PSH: Neuroeltrd Stim Post Tibial - 2022, 2022, 2022, 2022, 2022, 2022, 2022, 2022, 2022, 04/19/2020, 04/03/2020, 03/27/2020 Revise Knee Joint - 2011 Visit Complexity (formerly GPC1X) - 08/12/2022, 06/24/2022     GU PMH: Bladder Cancer overlapping sites - 08/12/2022, - 06/24/2022, - 01/01/2021, - 11/23/2020, - 2022, - 2021, - 2017 Nocturia - 08/12/2022, - 01/16/2022, - 12/16/2021 (Stable), - 12/09/2021, - 09/03/2021, -  2022 Overactive bladder - 08/12/2022, - 06/24/2022, - 01/16/2022, - 09/03/2021, - 06/18/2021, - 05/21/2021, - 01/01/2021 Prostate Cancer - 06/24/2022, - 09/03/2021, - 2022, - 2022, - 2021, - 2021, Prostate cancer, - 2016 BPH w/LUTS - 01/16/2022, - 12/16/2021, - 05/21/2021, - 2021, Benign localized prostatic hyperplasia with lower urinary tract symptoms (LUTS), - 2014 Post-void dribbling - 12/09/2021 Straining on Urination - 12/09/2021 Weak Urinary Stream - 12/09/2021 CIS of the bladder -  09/03/2021, - 2022, - 2021, Carcinoma in situ of bladder, - 2016 Urinary Frequency - 06/18/2021 Urinary Urgency (Stable) - 06/18/2021, - 01/01/2021, - 11/23/2020, - 2022, - 2022, - 2022, - 2022, - 2022, - 2022, - 2022, - 2022, - 2022, - 04/19/2020, - 04/03/2020, - 03/27/2020, - 2021, - 2021 Bladder Cancer Lateral - 2019, - 2018 Male ED, unspecified, Erectile dysfunction - 2016 Gross hematuria, Gross hematuria - 2014 Prostate nodule w/o LUTS, Nodular prostate without lower urinary tract symptoms - 2014 Urinary Tract Inf, Unspec site, Urinary tract infection - 2014    NON-GU PMH: Encounter for general adult medical examination without abnormal findings, Encounter for preventive health examination - 2016 Personal history of other diseases of the circulatory system, History of hypertension - 2014, History of cardiac disorder, - 2014 Personal history of other diseases of the nervous system and sense organs, History of glaucoma - 2014 Personal history of other endocrine, nutritional and metabolic disease, History of hypercholesterolemia - 2014, History of diabetes mellitus, - 2014    FAMILY HISTORY: Diabetes - Runs In Family Family Health Status Number - Runs In Family Father Deceased At Age86 ___ - Runs In Family Mother Deceased At Age 63 from diabetic complicati - Runs In Family sickle cell anemia - Runs In Family   SOCIAL HISTORY: Marital Status: Married Preferred Language: English; Ethnicity: Not Hispanic Or Latino; Race: Black or African American Current Smoking Status: Patient does not smoke anymore.      Notes: Former smoker, Caffeine Use, Tobacco Use, Marital History - Currently Married, Alcohol Use   REVIEW OF SYSTEMS:    GU Review Male:   Patient denies frequent urination, hard to postpone urination, burning/ pain with urination, get up at night to urinate, leakage of urine, stream starts and stops, trouble starting your stream, have to strain to urinate , erection problems, and penile  pain.  Gastrointestinal (Upper):   Patient denies nausea, vomiting, and indigestion/ heartburn.  Gastrointestinal (Lower):   Patient denies diarrhea and constipation.  Constitutional:   Patient denies fever, night sweats, weight loss, and fatigue.  Skin:   Patient denies skin rash/ lesion and itching.  Eyes:   Patient denies blurred vision and double vision.  Ears/ Nose/ Throat:   Patient denies sore throat and sinus problems.  Hematologic/Lymphatic:   Patient denies swollen glands and easy bruising.  Cardiovascular:   Patient denies leg swelling and chest pains.  Respiratory:   Patient denies cough and shortness of breath.  Endocrine:   Patient denies excessive thirst.  Musculoskeletal:   Patient denies back pain and joint pain.  Neurological:   Patient denies headaches and dizziness.  Psychologic:   Patient denies depression and anxiety.   VITAL SIGNS: None   GU PHYSICAL EXAMINATION:    Penis: Penis uncircumcised, phimosis. No foreskin warts, no cracks. No dorsal peyronie's plaques, no left corporal peyronie's plaques, no right corporal peyronie's plaques, no scarring, no shaft warts. No balanitis, no meatal stenosis.    MULTI-SYSTEM PHYSICAL EXAMINATION:       Complexity of Data:  Source Of History:  Patient  Lab Test Review:   PSA  Records Review:   Pathology Reports, Previous Doctor Records, Previous Patient Records, POC Tool  Urine Test Review:   Urinalysis   08/05/22 08/05/22 06/17/22 08/27/21 08/20/20 01/10/20 01/11/19 01/07/18  PSA  Total PSA 0.047 ng/ml 0.047 ng/mL 0.028 ng/mL 0.033 ng/mL 0.031 ng/mL 0.035 ng/mL 0.051 ng/mL 0.047 ng/mL    PROCEDURES:         Flexible Cystoscopy - 52000  Risks, benefits, and some of the potential complications of the procedure were discussed at length with the patient including infection, bleeding, voiding discomfort, urinary retention, fever, chills, sepsis, and others. All questions were answered. Informed consent was obtained. Sterile  technique and intraurethral analgesia were used.  Meatus:  Normal size. Normal location. Normal condition.  Urethra:  No strictures.  External Sphincter:  Normal.  Verumontanum:  Normal.  Prostate:  Non-obstructing. No hyperplasia.  Bladder Neck:  Mild bladder neck contracture.  Ureteral Orifices:  Normal location. Normal size. Normal shape. Effluxed clear urine.  Bladder:  No trabeculation. No tumors. Normal mucosa. No stones.      The lower urinary tract was carefully examined. The procedure was well-tolerated and without complications. Antibiotic instructions were given. Instructions were given to call the office immediately for bloody urine, difficulty urinating, urinary retention, painful or frequent urination, fever, chills, nausea, vomiting or other illness. The patient stated that he understood these instructions and would comply with them.         Visit Complexity - G2211          Urinalysis w/Scope - 81001 Dipstick Dipstick Cont'd Micro  Color: Yellow Bilirubin: Neg WBC/hpf: 0 - 5/hpf  Appearance: Clear Ketones: Neg RBC/hpf: 0 - 2/hpf  Specific Gravity: 1.020 Blood: Neg Bacteria: Few (10-25/hpf)  pH: 5.5 Protein: Trace Cystals: NS (Not Seen)  Glucose: Neg Urobilinogen: 0.2 Casts: Hyaline    Nitrites: Neg Trichomonas: Not Present    Leukocyte Esterase: 2+ Mucous: Present      Epithelial Cells: 0 - 5/hpf      Yeast: NS (Not Seen)      Sperm: Not Present    Notes:      ASSESSMENT:      ICD-10 Details  1 GU:   Bladder Cancer overlapping sites - C67.8   2   Phimosis - N47.1    PLAN:           Schedule Return Visit/Planned Activity: 1 Year - Cystoscopy  Return Visit/Planned Activity: ASAP - Schedule Surgery          Document Letter(s):  Created for Patient: Clinical Summary         Notes:   The patient's bladder demonstrated no evidence of recurrence of his cancer. He does have a moderate bladder neck contracture, but this was easily passed with a 16 [place]  scope. He also has significant phimosis which has progressed over the course of the last year.   The patient's voiding symptoms have not improved by doubling the tamsulosin, adding Vesicare to his [place]. He notes ongoing issues with urgency and associated incontinence. He has not had any change or improvement with the medications. He has been constipated more recently. I recommended that we stop all the medications except may be tamsulosin. These do not seem to be helping him at all. I think stopping on the medication and seeing where he is worse trying. If he wants to restart tamsulosin once daily that would be fine. Otherwise he is going  to continue with the pads.   The patient would like to have a circumcision for his progressive phimosis. He like to get this scheduled ASAP.

## 2022-10-29 NOTE — Transfer of Care (Signed)
Immediate Anesthesia Transfer of Care Note  Patient: [name]  Procedure(s) Performed: CIRCUMCISION ADULT  Patient Location: PACU  Anesthesia Type:MAC  Level of Consciousness: awake, drowsy, and patient cooperative  Airway & Oxygen Therapy: Patient Spontanous Breathing and Patient connected to face mask oxygen  Post-op Assessment: Report given to RN, Post -op Vital signs reviewed and stable, and Patient moving all extremities X 4  Post vital signs: Reviewed and stable  Last Vitals:  Vitals Value Taken Time  BP 136/100 10/29/22 1545  Temp    Pulse 63 10/29/22 1548  Resp 21 10/29/22 1548  SpO2 98 % 10/29/22 1548  Vitals shown include unvalidated device data.  Last Pain:  Vitals:   10/29/22 1121  TempSrc:   PainSc: 0-No pain         Complications: No notable events documented.

## 2022-10-29 NOTE — Discharge Instructions (Addendum)
Postoperative instructions for circumcision  Wound:  In most cases your incision will have absorbable sutures that run along the course of your incision and will dissolve within the first 10-20 days. Some will fall out even earlier. Expect some redness as the sutures dissolved but this should occur only around the sutures. If there is generalized redness, especially with increasing pain or swelling, let us know. The penis will very likely get "black and blue" as the blood in the tissues spread. Sometimes the whole penis will turn colors. The black and blue is followed by a yellow and brown color. In time, all the discoloration will go away.  REMOVE BROWN DRESSING IN 48 HOURS.   Diet:  You may return to your normal diet within 24 hours following your surgery. You may note some mild nausea and possibly vomiting the first 6-8 hours following surgery. This is usually due to the side effects of anesthesia, and will disappear quite soon. I would suggest clear liquids and a very light meal the first evening following your surgery.  Activity:  Your physical activity should be restricted the first 48 hours. During that time you should remain relatively inactive, moving about only when necessary. During the first 7-10 days following surgery he should avoid lifting any heavy objects (anything greater than 15 pounds), and avoid strenuous exercise. If you work, ask [place] specifically about your restrictions, both for work and home. We will write a note to your employer if needed.  Ice packs can be placed on and off over the penis for the first 48 hours to help relieve the pain and keep the swelling down. Frozen peas or corn in a ZipLock bag can be frozen, used and re-frozen. Fifteen minutes on and 15 minutes off is a reasonable schedule.   Hygiene:  You may shower 48 hours after your surgery. Tub bathing should be restricted until the seventh day.  Medication:  You will be sent home with some type of pain  medication. In many cases you will be sent home with a narcotic pain pill (Vicodin or Tylox). If the pain is not too bad, you may take either Tylenol (acetaminophen) or Advil (ibuprofen) which contain no narcotic agents, and might be tolerated a little better, with fewer side effects. If the pain medication you are sent home with does not control the pain, you will have to let us know. Some narcotic pain medications cannot be given or refilled by a phone call to a pharmacy.  Problems you should report to [place]:  Fever of 101.0 degrees Fahrenheit or greater. Moderate or severe swelling under the skin incision or involving the scrotum. Drug reaction such as hives, a rash, nausea or vomiting.

## 2022-10-30 ENCOUNTER — Encounter (HOSPITAL_COMMUNITY): Payer: Self-pay | Admitting: Urology

## 2022-10-30 NOTE — Anesthesia Postprocedure Evaluation (Signed)
Anesthesia Post Note  Patient: [name]  Procedure(s) Performed: CIRCUMCISION ADULT     Patient location during evaluation: PACU Anesthesia Type: MAC Level of consciousness: awake and alert Pain management: pain level controlled Vital Signs Assessment: post-procedure vital signs reviewed and stable Respiratory status: spontaneous breathing, nonlabored ventilation, respiratory function stable and patient connected to nasal cannula oxygen Cardiovascular status: stable and blood pressure returned to baseline Postop Assessment: no apparent nausea or vomiting Anesthetic complications: no   No notable events documented.  Last Vitals:  Vitals:   10/29/22 1700 10/29/22 1713  BP: (!) 153/69 (!) 155/73  Pulse: 62 (!) 58  Resp: 14 14  Temp: 36.5 C 36.5 C  SpO2: 92% 95%    Last Pain:  Vitals:   10/29/22 1713  TempSrc:   PainSc: 0-No pain                 Kennieth Rad

## 2022-11-12 DIAGNOSIS — N471 Phimosis: Secondary | ICD-10-CM | POA: Diagnosis not present

## 2022-11-24 DIAGNOSIS — I152 Hypertension secondary to endocrine disorders: Secondary | ICD-10-CM | POA: Diagnosis not present

## 2022-11-24 DIAGNOSIS — E1122 Type 2 diabetes mellitus with diabetic chronic kidney disease: Secondary | ICD-10-CM | POA: Diagnosis not present

## 2022-11-24 DIAGNOSIS — E1159 Type 2 diabetes mellitus with other circulatory complications: Secondary | ICD-10-CM | POA: Diagnosis not present

## 2022-11-24 DIAGNOSIS — F0150 Vascular dementia without behavioral disturbance: Secondary | ICD-10-CM | POA: Diagnosis not present

## 2022-11-24 DIAGNOSIS — G214 Vascular parkinsonism: Secondary | ICD-10-CM | POA: Diagnosis not present

## 2022-11-24 DIAGNOSIS — N471 Phimosis: Secondary | ICD-10-CM | POA: Diagnosis not present

## 2022-11-24 DIAGNOSIS — I2510 Atherosclerotic heart disease of native coronary artery without angina pectoris: Secondary | ICD-10-CM | POA: Diagnosis not present

## 2022-11-24 DIAGNOSIS — N182 Chronic kidney disease, stage 2 (mild): Secondary | ICD-10-CM | POA: Diagnosis not present

## 2022-11-24 DIAGNOSIS — F330 Major depressive disorder, recurrent, mild: Secondary | ICD-10-CM | POA: Diagnosis not present

## 2022-12-01 DIAGNOSIS — N182 Chronic kidney disease, stage 2 (mild): Secondary | ICD-10-CM | POA: Diagnosis not present

## 2022-12-01 DIAGNOSIS — G214 Vascular parkinsonism: Secondary | ICD-10-CM | POA: Diagnosis not present

## 2022-12-01 DIAGNOSIS — E1122 Type 2 diabetes mellitus with diabetic chronic kidney disease: Secondary | ICD-10-CM | POA: Diagnosis not present

## 2022-12-01 DIAGNOSIS — F330 Major depressive disorder, recurrent, mild: Secondary | ICD-10-CM | POA: Diagnosis not present

## 2022-12-01 DIAGNOSIS — N471 Phimosis: Secondary | ICD-10-CM | POA: Diagnosis not present

## 2022-12-01 DIAGNOSIS — I2510 Atherosclerotic heart disease of native coronary artery without angina pectoris: Secondary | ICD-10-CM | POA: Diagnosis not present

## 2022-12-01 DIAGNOSIS — E1159 Type 2 diabetes mellitus with other circulatory complications: Secondary | ICD-10-CM | POA: Diagnosis not present

## 2022-12-01 DIAGNOSIS — F0150 Vascular dementia without behavioral disturbance: Secondary | ICD-10-CM | POA: Diagnosis not present

## 2022-12-01 DIAGNOSIS — I152 Hypertension secondary to endocrine disorders: Secondary | ICD-10-CM | POA: Diagnosis not present

## 2022-12-08 DIAGNOSIS — E7800 Pure hypercholesterolemia, unspecified: Secondary | ICD-10-CM | POA: Diagnosis not present

## 2022-12-08 DIAGNOSIS — E1165 Type 2 diabetes mellitus with hyperglycemia: Secondary | ICD-10-CM | POA: Diagnosis not present

## 2022-12-12 DIAGNOSIS — N182 Chronic kidney disease, stage 2 (mild): Secondary | ICD-10-CM | POA: Diagnosis not present

## 2022-12-12 DIAGNOSIS — I2510 Atherosclerotic heart disease of native coronary artery without angina pectoris: Secondary | ICD-10-CM | POA: Diagnosis not present

## 2022-12-12 DIAGNOSIS — E1165 Type 2 diabetes mellitus with hyperglycemia: Secondary | ICD-10-CM | POA: Diagnosis not present

## 2022-12-12 DIAGNOSIS — F0150 Vascular dementia without behavioral disturbance: Secondary | ICD-10-CM | POA: Diagnosis not present

## 2022-12-12 DIAGNOSIS — F330 Major depressive disorder, recurrent, mild: Secondary | ICD-10-CM | POA: Diagnosis not present

## 2022-12-12 DIAGNOSIS — I152 Hypertension secondary to endocrine disorders: Secondary | ICD-10-CM | POA: Diagnosis not present

## 2022-12-12 DIAGNOSIS — G214 Vascular parkinsonism: Secondary | ICD-10-CM | POA: Diagnosis not present

## 2022-12-12 DIAGNOSIS — E1159 Type 2 diabetes mellitus with other circulatory complications: Secondary | ICD-10-CM | POA: Diagnosis not present

## 2022-12-12 DIAGNOSIS — E1122 Type 2 diabetes mellitus with diabetic chronic kidney disease: Secondary | ICD-10-CM | POA: Diagnosis not present

## 2022-12-12 DIAGNOSIS — N471 Phimosis: Secondary | ICD-10-CM | POA: Diagnosis not present

## 2022-12-15 DIAGNOSIS — E1165 Type 2 diabetes mellitus with hyperglycemia: Secondary | ICD-10-CM | POA: Diagnosis not present

## 2022-12-15 DIAGNOSIS — E7800 Pure hypercholesterolemia, unspecified: Secondary | ICD-10-CM | POA: Diagnosis not present

## 2022-12-15 DIAGNOSIS — N189 Chronic kidney disease, unspecified: Secondary | ICD-10-CM | POA: Diagnosis not present

## 2022-12-15 DIAGNOSIS — I10 Essential (primary) hypertension: Secondary | ICD-10-CM | POA: Diagnosis not present

## 2022-12-19 DIAGNOSIS — H401112 Primary open-angle glaucoma, right eye, moderate stage: Secondary | ICD-10-CM | POA: Diagnosis not present

## 2022-12-19 DIAGNOSIS — Z794 Long term (current) use of insulin: Secondary | ICD-10-CM | POA: Diagnosis not present

## 2022-12-19 DIAGNOSIS — H26491 Other secondary cataract, right eye: Secondary | ICD-10-CM | POA: Diagnosis not present

## 2022-12-19 DIAGNOSIS — Z961 Presence of intraocular lens: Secondary | ICD-10-CM | POA: Diagnosis not present

## 2022-12-19 DIAGNOSIS — H401123 Primary open-angle glaucoma, left eye, severe stage: Secondary | ICD-10-CM | POA: Diagnosis not present

## 2022-12-19 DIAGNOSIS — E119 Type 2 diabetes mellitus without complications: Secondary | ICD-10-CM | POA: Diagnosis not present

## 2022-12-24 DIAGNOSIS — N471 Phimosis: Secondary | ICD-10-CM | POA: Diagnosis not present

## 2022-12-26 DIAGNOSIS — E785 Hyperlipidemia, unspecified: Secondary | ICD-10-CM | POA: Diagnosis not present

## 2022-12-26 DIAGNOSIS — F330 Major depressive disorder, recurrent, mild: Secondary | ICD-10-CM | POA: Diagnosis not present

## 2022-12-26 DIAGNOSIS — I10 Essential (primary) hypertension: Secondary | ICD-10-CM | POA: Diagnosis not present

## 2022-12-26 DIAGNOSIS — I700 Atherosclerosis of aorta: Secondary | ICD-10-CM | POA: Diagnosis not present

## 2022-12-26 DIAGNOSIS — Z23 Encounter for immunization: Secondary | ICD-10-CM | POA: Diagnosis not present

## 2022-12-26 DIAGNOSIS — E1169 Type 2 diabetes mellitus with other specified complication: Secondary | ICD-10-CM | POA: Diagnosis not present

## 2022-12-26 DIAGNOSIS — E11319 Type 2 diabetes mellitus with unspecified diabetic retinopathy without macular edema: Secondary | ICD-10-CM | POA: Diagnosis not present

## 2022-12-26 DIAGNOSIS — Z9989 Dependence on other enabling machines and devices: Secondary | ICD-10-CM | POA: Diagnosis not present

## 2022-12-26 DIAGNOSIS — F01A0 Vascular dementia, mild, without behavioral disturbance, psychotic disturbance, mood disturbance, and anxiety: Secondary | ICD-10-CM | POA: Diagnosis not present

## 2022-12-26 DIAGNOSIS — G4730 Sleep apnea, unspecified: Secondary | ICD-10-CM | POA: Diagnosis not present

## 2023-01-01 ENCOUNTER — Encounter: Payer: Self-pay | Admitting: Podiatry

## 2023-01-01 ENCOUNTER — Ambulatory Visit (INDEPENDENT_AMBULATORY_CARE_PROVIDER_SITE_OTHER): Payer: Medicare HMO | Admitting: Podiatry

## 2023-01-01 DIAGNOSIS — E1121 Type 2 diabetes mellitus with diabetic nephropathy: Secondary | ICD-10-CM

## 2023-01-01 DIAGNOSIS — B351 Tinea unguium: Secondary | ICD-10-CM

## 2023-01-01 DIAGNOSIS — Z794 Long term (current) use of insulin: Secondary | ICD-10-CM | POA: Diagnosis not present

## 2023-01-01 DIAGNOSIS — M79675 Pain in left toe(s): Secondary | ICD-10-CM

## 2023-01-01 DIAGNOSIS — M79674 Pain in right toe(s): Secondary | ICD-10-CM | POA: Diagnosis not present

## 2023-01-01 NOTE — Progress Notes (Signed)
This patient returns to my office for at risk foot care.  This patient requires this care by a professional since this patient will be at risk due to having type 2 diabetes.  This patient is unable to cut nails himself since the patient cannot reach his nails.These nails are painful walking and wearing shoes.  This patient presents for at risk foot care today.  General Appearance  Alert, conversant and in no acute stress.  Vascular  Dorsalis pedis and posterior tibial  pulses are palpable  bilaterally.  Capillary return is within normal limits  bilaterally. Temperature is within normal limits  bilaterally.  Neurologic  Senn-Weinstein monofilament wire test within normal limits  bilaterally. Muscle power within normal limits bilaterally.  Nails Thick disfigured discolored nails with subungual debris  from hallux to fifth toes bilaterally. No evidence of bacterial infection or drainage bilaterally.  Orthopedic  No limitations of motion  feet .  No crepitus or effusions noted.  No bony pathology or digital deformities noted.  Skin  normotropic skin with no porokeratosis noted bilaterally.  No signs of infections or ulcers noted.     Onychomycosis  Pain in right toes  Pain in left toes  Consent was obtained for treatment procedures.   Mechanical debridement of nails 1-5  bilaterally performed with a nail nipper.  Filed with dremel without incident.    Return office visit   4 months                   Told patient to return for periodic foot care and evaluation due to potential at risk complications.   Gregory Mayer DPM   

## 2023-02-24 DIAGNOSIS — N189 Chronic kidney disease, unspecified: Secondary | ICD-10-CM | POA: Diagnosis not present

## 2023-02-24 DIAGNOSIS — E7800 Pure hypercholesterolemia, unspecified: Secondary | ICD-10-CM | POA: Diagnosis not present

## 2023-02-24 DIAGNOSIS — I10 Essential (primary) hypertension: Secondary | ICD-10-CM | POA: Diagnosis not present

## 2023-02-24 DIAGNOSIS — E1165 Type 2 diabetes mellitus with hyperglycemia: Secondary | ICD-10-CM | POA: Diagnosis not present

## 2023-03-04 ENCOUNTER — Telehealth: Payer: Self-pay | Admitting: Neurology

## 2023-03-10 NOTE — Telephone Encounter (Signed)
Error

## 2023-03-12 DIAGNOSIS — E1165 Type 2 diabetes mellitus with hyperglycemia: Secondary | ICD-10-CM | POA: Diagnosis not present

## 2023-04-16 ENCOUNTER — Emergency Department (HOSPITAL_BASED_OUTPATIENT_CLINIC_OR_DEPARTMENT_OTHER): Payer: Medicare HMO | Admitting: Radiology

## 2023-04-16 ENCOUNTER — Other Ambulatory Visit: Payer: Self-pay

## 2023-04-16 ENCOUNTER — Inpatient Hospital Stay (HOSPITAL_BASED_OUTPATIENT_CLINIC_OR_DEPARTMENT_OTHER)
Admission: EM | Admit: 2023-04-16 | Discharge: 2023-04-19 | DRG: 177 | Disposition: A | Discharge status: Home / Self Care | Payer: Medicare HMO | Attending: Internal Medicine | Admitting: Internal Medicine

## 2023-04-16 ENCOUNTER — Encounter (HOSPITAL_BASED_OUTPATIENT_CLINIC_OR_DEPARTMENT_OTHER): Payer: Self-pay | Admitting: Emergency Medicine

## 2023-04-16 DIAGNOSIS — J9601 Acute respiratory failure with hypoxia: Secondary | ICD-10-CM | POA: Diagnosis present

## 2023-04-16 DIAGNOSIS — J1282 Pneumonia due to coronavirus disease 2019: Secondary | ICD-10-CM | POA: Diagnosis not present

## 2023-04-16 DIAGNOSIS — E1122 Type 2 diabetes mellitus with diabetic chronic kidney disease: Secondary | ICD-10-CM | POA: Diagnosis not present

## 2023-04-16 DIAGNOSIS — Z96651 Presence of right artificial knee joint: Secondary | ICD-10-CM | POA: Diagnosis present

## 2023-04-16 DIAGNOSIS — Z923 Personal history of irradiation: Secondary | ICD-10-CM

## 2023-04-16 DIAGNOSIS — Z9079 Acquired absence of other genital organ(s): Secondary | ICD-10-CM | POA: Diagnosis not present

## 2023-04-16 DIAGNOSIS — F0150 Vascular dementia without behavioral disturbance: Secondary | ICD-10-CM | POA: Diagnosis not present

## 2023-04-16 DIAGNOSIS — J9600 Acute respiratory failure, unspecified whether with hypoxia or hypercapnia: Secondary | ICD-10-CM | POA: Diagnosis not present

## 2023-04-16 DIAGNOSIS — R509 Fever, unspecified: Secondary | ICD-10-CM | POA: Diagnosis not present

## 2023-04-16 DIAGNOSIS — R079 Chest pain, unspecified: Secondary | ICD-10-CM | POA: Diagnosis not present

## 2023-04-16 DIAGNOSIS — Z833 Family history of diabetes mellitus: Secondary | ICD-10-CM

## 2023-04-16 DIAGNOSIS — Z87891 Personal history of nicotine dependence: Secondary | ICD-10-CM

## 2023-04-16 DIAGNOSIS — G20A1 Parkinson's disease without dyskinesia, without mention of fluctuations: Secondary | ICD-10-CM | POA: Diagnosis present

## 2023-04-16 DIAGNOSIS — Z8673 Personal history of transient ischemic attack (TIA), and cerebral infarction without residual deficits: Secondary | ICD-10-CM | POA: Diagnosis not present

## 2023-04-16 DIAGNOSIS — H9190 Unspecified hearing loss, unspecified ear: Secondary | ICD-10-CM | POA: Diagnosis present

## 2023-04-16 DIAGNOSIS — Z8546 Personal history of malignant neoplasm of prostate: Secondary | ICD-10-CM

## 2023-04-16 DIAGNOSIS — M109 Gout, unspecified: Secondary | ICD-10-CM | POA: Diagnosis present

## 2023-04-16 DIAGNOSIS — U071 COVID-19: Principal | ICD-10-CM | POA: Diagnosis present

## 2023-04-16 DIAGNOSIS — R0789 Other chest pain: Secondary | ICD-10-CM | POA: Diagnosis not present

## 2023-04-16 DIAGNOSIS — Z7982 Long term (current) use of aspirin: Secondary | ICD-10-CM

## 2023-04-16 DIAGNOSIS — G20C Parkinsonism, unspecified: Secondary | ICD-10-CM | POA: Diagnosis present

## 2023-04-16 DIAGNOSIS — Z79899 Other long term (current) drug therapy: Secondary | ICD-10-CM

## 2023-04-16 DIAGNOSIS — E785 Hyperlipidemia, unspecified: Secondary | ICD-10-CM | POA: Diagnosis present

## 2023-04-16 DIAGNOSIS — I130 Hypertensive heart and chronic kidney disease with heart failure and stage 1 through stage 4 chronic kidney disease, or unspecified chronic kidney disease: Secondary | ICD-10-CM | POA: Diagnosis not present

## 2023-04-16 DIAGNOSIS — E1169 Type 2 diabetes mellitus with other specified complication: Secondary | ICD-10-CM | POA: Diagnosis not present

## 2023-04-16 DIAGNOSIS — J449 Chronic obstructive pulmonary disease, unspecified: Secondary | ICD-10-CM | POA: Diagnosis present

## 2023-04-16 DIAGNOSIS — D869 Sarcoidosis, unspecified: Secondary | ICD-10-CM | POA: Diagnosis present

## 2023-04-16 DIAGNOSIS — Z794 Long term (current) use of insulin: Secondary | ICD-10-CM | POA: Diagnosis not present

## 2023-04-16 DIAGNOSIS — I509 Heart failure, unspecified: Secondary | ICD-10-CM | POA: Diagnosis present

## 2023-04-16 DIAGNOSIS — Z8249 Family history of ischemic heart disease and other diseases of the circulatory system: Secondary | ICD-10-CM | POA: Diagnosis not present

## 2023-04-16 DIAGNOSIS — E1121 Type 2 diabetes mellitus with diabetic nephropathy: Secondary | ICD-10-CM | POA: Diagnosis present

## 2023-04-16 DIAGNOSIS — Z8551 Personal history of malignant neoplasm of bladder: Secondary | ICD-10-CM | POA: Diagnosis not present

## 2023-04-16 DIAGNOSIS — R918 Other nonspecific abnormal finding of lung field: Secondary | ICD-10-CM | POA: Diagnosis not present

## 2023-04-16 DIAGNOSIS — R0602 Shortness of breath: Secondary | ICD-10-CM | POA: Diagnosis not present

## 2023-04-16 DIAGNOSIS — E7800 Pure hypercholesterolemia, unspecified: Secondary | ICD-10-CM | POA: Diagnosis present

## 2023-04-16 DIAGNOSIS — F0280 Dementia in other diseases classified elsewhere without behavioral disturbance: Secondary | ICD-10-CM | POA: Diagnosis present

## 2023-04-16 DIAGNOSIS — Z888 Allergy status to other drugs, medicaments and biological substances status: Secondary | ICD-10-CM

## 2023-04-16 DIAGNOSIS — I10 Essential (primary) hypertension: Secondary | ICD-10-CM | POA: Diagnosis present

## 2023-04-16 LAB — CBC
HCT: 35.1 % — ABNORMAL LOW (ref 39.0–52.0)
HCT: 35.6 % — ABNORMAL LOW (ref 39.0–52.0)
Hemoglobin: 11.2 g/dL — ABNORMAL LOW (ref 13.0–17.0)
Hemoglobin: 11.6 g/dL — ABNORMAL LOW (ref 13.0–17.0)
MCH: 24.8 pg — ABNORMAL LOW (ref 26.0–34.0)
MCH: 25.7 pg — ABNORMAL LOW (ref 26.0–34.0)
MCHC: 31.9 g/dL (ref 30.0–36.0)
MCHC: 32.6 g/dL (ref 30.0–36.0)
MCV: 77.7 fL — ABNORMAL LOW (ref 80.0–100.0)
MCV: 78.8 fL — ABNORMAL LOW (ref 80.0–100.0)
Platelets: 145 10*3/uL — ABNORMAL LOW (ref 150–400)
Platelets: 144 10*3/uL — ABNORMAL LOW (ref 150–400)
RBC: 4.52 MIL/uL (ref 4.22–5.81)
RBC: 4.52 MIL/uL (ref 4.22–5.81)
RDW: 16.8 % — ABNORMAL HIGH (ref 11.5–15.5)
RDW: 16.8 % — ABNORMAL HIGH (ref 11.5–15.5)
WBC: 6.1 10*3/uL (ref 4.0–10.5)
WBC: 5.5 10*3/uL (ref 4.0–10.5)
nRBC: 0 % (ref 0.0–0.2)
nRBC: 0 % (ref 0.0–0.2)

## 2023-04-16 LAB — CREATININE, SERUM
Creatinine, Ser: 1.07 mg/dL (ref 0.61–1.24)
GFR, Estimated: >60 mL/min (ref >60)

## 2023-04-16 LAB — GLUCOSE, CAPILLARY: Glucose-Capillary: 326 mg/dL — ABNORMAL HIGH (ref 70–99)

## 2023-04-16 LAB — MAGNESIUM: Magnesium: 1.8 mg/dL (ref 1.7–2.4)

## 2023-04-16 LAB — BASIC METABOLIC PANEL
Anion gap: 9 (ref 5–15)
BUN: 18 mg/dL (ref 8–23)
CO2: 27 mmol/L (ref 22–32)
Calcium: 9 mg/dL (ref 8.9–10.3)
Chloride: 109 mmol/L (ref 98–111)
Creatinine, Ser: 1.27 mg/dL — ABNORMAL HIGH (ref 0.61–1.24)
GFR, Estimated: 57 mL/min — ABNORMAL LOW (ref >60)
Glucose, Bld: 180 mg/dL — ABNORMAL HIGH (ref 70–99)
Potassium: 3.4 mmol/L — ABNORMAL LOW (ref 3.5–5.1)
Sodium: 145 mmol/L (ref 135–145)

## 2023-04-16 LAB — RESP PANEL BY RT-PCR (RSV, FLU A&B, COVID)  RVPGX2
Influenza A by PCR: NEGATIVE
Influenza B by PCR: NEGATIVE
Resp Syncytial Virus by PCR: NEGATIVE
SARS Coronavirus 2 by RT PCR: POSITIVE — AB

## 2023-04-16 LAB — DIFFERENTIAL
Abs Immature Granulocytes: 0.03 10*3/uL (ref 0.00–0.07)
Basophils Absolute: 0 10*3/uL (ref 0.0–0.1)
Basophils Relative: 0 %
Eosinophils Absolute: 0 10*3/uL (ref 0.0–0.5)
Eosinophils Relative: 0 %
Immature Granulocytes: 1 %
Lymphocytes Relative: 15 %
Lymphs Abs: 0.9 10*3/uL (ref 0.7–4.0)
Monocytes Absolute: 0.2 10*3/uL (ref 0.1–1.0)
Monocytes Relative: 4 %
Neutro Abs: 4.4 10*3/uL (ref 1.7–7.7)
Neutrophils Relative %: 80 %

## 2023-04-16 LAB — PHOSPHORUS: Phosphorus: 3.5 mg/dL (ref 2.5–4.6)

## 2023-04-16 LAB — TROPONIN I (HIGH SENSITIVITY)
Troponin I (High Sensitivity): 17 ng/L (ref <18)
Troponin I (High Sensitivity): 18 ng/L — ABNORMAL HIGH (ref <18)

## 2023-04-16 LAB — PROCALCITONIN: Procalcitonin: <0.1 ng/mL

## 2023-04-16 LAB — D-DIMER, QUANTITATIVE: D-Dimer, Quant: 1.38 ug{FEU}/mL — ABNORMAL HIGH (ref 0.00–0.50)

## 2023-04-16 LAB — BRAIN NATRIURETIC PEPTIDE: B Natriuretic Peptide: 336.2 pg/mL — ABNORMAL HIGH (ref 0.0–100.0)

## 2023-04-16 MED ORDER — SODIUM CHLORIDE 0.9 % IV SOLN
100.0000 mg | Freq: Once | INTRAVENOUS | Status: AC
  Administered 2023-04-16: 100 mg via INTRAVENOUS

## 2023-04-16 MED ORDER — ATORVASTATIN CALCIUM 20 MG PO TABS
20.0000 mg | ORAL_TABLET | Freq: Every day | ORAL | Status: DC
  Administered 2023-04-16 – 2023-04-19 (×4): 20 mg via ORAL
  Filled 2023-04-16 (×4): qty 1

## 2023-04-16 MED ORDER — INSULIN ASPART 100 UNIT/ML IJ SOLN
0.0000 [IU] | Freq: Every day | INTRAMUSCULAR | Status: DC
  Administered 2023-04-16 – 2023-04-18 (×3): 4 [IU] via SUBCUTANEOUS

## 2023-04-16 MED ORDER — SODIUM CHLORIDE 0.9 % IV SOLN: INTRAVENOUS | Status: AC | PRN

## 2023-04-16 MED ORDER — PANTOPRAZOLE SODIUM 20 MG PO TBEC
20.0000 mg | DELAYED_RELEASE_TABLET | Freq: Every day | ORAL | Status: DC
  Administered 2023-04-16 – 2023-04-19 (×4): 20 mg via ORAL
  Filled 2023-04-16 (×4): qty 1

## 2023-04-16 MED ORDER — SODIUM CHLORIDE 0.9% FLUSH
3.0000 mL | Freq: Two times a day (BID) | INTRAVENOUS | Status: DC
  Administered 2023-04-16 – 2023-04-19 (×6): 3 mL via INTRAVENOUS

## 2023-04-16 MED ORDER — CARBIDOPA-LEVODOPA 25-100 MG PO TABS
1.0000 | ORAL_TABLET | Freq: Two times a day (BID) | ORAL | Status: DC
  Administered 2023-04-17 – 2023-04-19 (×5): 1 via ORAL
  Filled 2023-04-16 (×6): qty 1

## 2023-04-16 MED ORDER — CARBIDOPA-LEVODOPA 25-100 MG PO TABS
2.0000 | ORAL_TABLET | Freq: Every day | ORAL | Status: DC
  Administered 2023-04-16 – 2023-04-18 (×3): 2 via ORAL
  Filled 2023-04-16 (×3): qty 2

## 2023-04-16 MED ORDER — LABETALOL HCL 5 MG/ML IV SOLN: 20.0000 mg | INTRAVENOUS | Status: DC | PRN

## 2023-04-16 MED ORDER — HYDRALAZINE HCL 20 MG/ML IJ SOLN: 10.0000 mg | INTRAMUSCULAR | Status: DC | PRN

## 2023-04-16 MED ORDER — ASPIRIN 81 MG PO TBEC
81.0000 mg | DELAYED_RELEASE_TABLET | Freq: Every day | ORAL | Status: DC
  Administered 2023-04-16 – 2023-04-18 (×3): 81 mg via ORAL
  Filled 2023-04-16 (×3): qty 1

## 2023-04-16 MED ORDER — POTASSIUM CHLORIDE CRYS ER 20 MEQ PO TBCR
40.0000 meq | EXTENDED_RELEASE_TABLET | Freq: Once | ORAL | Status: AC
  Administered 2023-04-16: 40 meq via ORAL
  Filled 2023-04-16: qty 2

## 2023-04-16 MED ORDER — POLYETHYLENE GLYCOL 3350 17 G PO PACK: 17.0000 g | PACK | Freq: Every day | ORAL | Status: DC | PRN

## 2023-04-16 MED ORDER — SODIUM CHLORIDE 0.9 % IV SOLN: 100.0000 mg | Freq: Every day | INTRAVENOUS | Status: DC

## 2023-04-16 MED ORDER — ACETAMINOPHEN 325 MG PO TABS: 650.0000 mg | ORAL_TABLET | Freq: Four times a day (QID) | ORAL | Status: DC | PRN

## 2023-04-16 MED ORDER — DEXAMETHASONE SODIUM PHOSPHATE 10 MG/ML IJ SOLN
10.0000 mg | Freq: Once | INTRAMUSCULAR | Status: AC
  Administered 2023-04-16: 10 mg via INTRAVENOUS
  Filled 2023-04-16: qty 1

## 2023-04-16 MED ORDER — SODIUM CHLORIDE 0.9 % IV SOLN: 200.0000 mg | Freq: Once | INTRAVENOUS | Status: DC

## 2023-04-16 MED ORDER — ENOXAPARIN SODIUM 40 MG/0.4ML IJ SOSY
40.0000 mg | PREFILLED_SYRINGE | INTRAMUSCULAR | Status: DC
  Administered 2023-04-17 – 2023-04-19 (×3): 40 mg via SUBCUTANEOUS
  Filled 2023-04-16 (×3): qty 0.4

## 2023-04-16 MED ORDER — REMDESIVIR 100 MG IV SOLR
100.0000 mg | Freq: Once | INTRAVENOUS | Status: AC
  Administered 2023-04-16: 100 mg via INTRAVENOUS

## 2023-04-16 MED ORDER — DEXAMETHASONE 4 MG PO TABS
6.0000 mg | ORAL_TABLET | Freq: Every day | ORAL | Status: DC
  Administered 2023-04-17 – 2023-04-19 (×3): 6 mg via ORAL
  Filled 2023-04-16 (×3): qty 1

## 2023-04-16 MED ORDER — INSULIN ASPART 100 UNIT/ML IJ SOLN
0.0000 [IU] | Freq: Three times a day (TID) | INTRAMUSCULAR | Status: DC
  Administered 2023-04-17 (×3): 8 [IU] via SUBCUTANEOUS
  Administered 2023-04-18: 15 [IU] via SUBCUTANEOUS
  Administered 2023-04-18: 11 [IU] via SUBCUTANEOUS
  Administered 2023-04-18 – 2023-04-19 (×2): 3 [IU] via SUBCUTANEOUS
  Administered 2023-04-19: 11 [IU] via SUBCUTANEOUS

## 2023-04-16 MED ORDER — ACETAMINOPHEN 650 MG RE SUPP: 650.0000 mg | Freq: Four times a day (QID) | RECTAL | Status: DC | PRN

## 2023-04-16 MED ORDER — SODIUM CHLORIDE 0.9 % IV SOLN
100.0000 mg | Freq: Every day | INTRAVENOUS | Status: AC
  Administered 2023-04-17 – 2023-04-18 (×2): 100 mg via INTRAVENOUS
  Filled 2023-04-16 (×2): qty 20

## 2023-04-16 MED ORDER — TAMSULOSIN HCL 0.4 MG PO CAPS
0.4000 mg | ORAL_CAPSULE | Freq: Every day | ORAL | Status: DC
  Administered 2023-04-16 – 2023-04-18 (×3): 0.4 mg via ORAL
  Filled 2023-04-16 (×3): qty 1

## 2023-04-16 NOTE — Progress Notes (Signed)
Plan of Care Note for accepted transfer   Patient: [name] MRN: [id number]   DOA: 04/16/2023  Facility requesting transfer: [facility]. Requesting Provider: Lorre Nick, MD. Reason for transfer: COVID-19 respiratory infection. Facility course:  Per Dr. Freida Busman: " [name] is a 82 y.o. male.   82 year old male presents with several days of cough congestion.  Patient has had increased temperature at home to 99.6.  Also notes chest pain which has been persistent.  History of COPD.  Notes increased dyspnea exertion.  No new pedal edema.  Family called EMS last night and will concern for possible pneumonia.  Patient was stable at that time and was not transported.  Patient does use home oxygen periodically last use was over 6 months ago.  Pulse oximetry today was 81% on room air.  Patient placed on 2 L of oxygen here and pulse ox is now 92%."  Lab work:  Adult nurse [[id number]] (Abnormal)   Collected: 04/16/23 1136   Updated: 04/16/23 1231   Specimen Type: Blood    Sodium 145 mmol/L   Potassium 3.4 Low  mmol/L   Chloride 109 mmol/L   CO2 27 mmol/L   Glucose, Bld 180 High  mg/dL   BUN 18 mg/dL   Creatinine, Ser 1.02 High  mg/dL   Calcium 9.0 mg/dL   GFR, Estimated 57 Low  mL/min   Anion gap 9  Troponin I (High Sensitivity) [[id number]]   Collected: 04/16/23 1136   Updated: 04/16/23 1226    Troponin I (High Sensitivity) 17 ng/L  Brain natriuretic peptide [[id number]] (Abnormal)   Collected: 04/16/23 1136   Updated: 04/16/23 1225   Specimen Type: Blood    B Natriuretic Peptide 336.2 High  pg/mL  Resp panel by RT-PCR (RSV, Flu A&B, Covid) Anterior Nasal Swab [[id number]] (Abnormal)   Collected: 04/16/23 1124   Updated: 04/16/23 1210   Specimen Source: Anterior Nasal Swab    SARS Coronavirus 2 by RT PCR POSITIVE Abnormal    Influenza A by PCR NEGATIVE   Influenza B by PCR NEGATIVE   Resp Syncytial Virus by PCR NEGATIVE  D-dimer, quantitative  [[id number]] (Abnormal)   Collected: 04/16/23 1143   Updated: 04/16/23 1206   Specimen Type: Blood    D-Dimer, Quant 1.38 High  ug/mL-FEU  CBC [[id number]] (Abnormal)   Collected: 04/16/23 1136   Updated: 04/16/23 1153   Specimen Type: Blood    WBC 6.1 K/uL   RBC 4.52 MIL/uL   Hemoglobin 11.2 Low  g/dL   HCT 93.2 Low  %   MCV 77.7 Low  fL   MCH 24.8 Low  pg   MCHC 31.9 g/dL   RDW 35.5 High  %   Platelets 145 Low  K/uL   nRBC 0.0 %   Imaging: EXAM: CHEST - 2 VIEW   COMPARISON:  Chest x-ray dated July 25, 2022.   FINDINGS: Normal heart size. Peripheral hazy airspace opacities in both mid lungs. No pleural effusion or pneumothorax. No acute osseous abnormality.   IMPRESSION: 1. Peripheral hazy airspace opacities in both mid lungs, concerning for multifocal pneumonia.    Electronically Signed   By: Obie Dredge M.D.   On: 04/16/2023 12:09  Plan of care: The patient is accepted for admission to Progressive unit, at [facility].  He has received supplemental oxygen and dexamethasone.  Symptoms have been present for less than 5 days.  Advised to start her on remdesivir.  Author: Bobette Mo, MD 04/16/2023  Check [email or web address]  for on-call coverage.  Nursing staff, Please call [facility] Admits & Consults System-Wide number on Amion as soon as patient's arrival, so appropriate admitting provider can evaluate the pt.

## 2023-04-16 NOTE — ED Provider Notes (Signed)
[place] EMERGENCY DEPARTMENT AT [facility] Provider Note   CSN: [id number] Arrival date & time: 04/16/23  1037     History  No chief complaint on file.   [name] is a 82 y.o. male.  82 year old male presents with several days of cough congestion.  Patient has had increased temperature at home to 99.6.  Also notes chest pain which has been persistent.  History of COPD.  Notes increased dyspnea exertion.  No new pedal edema.  Family called EMS last night and will concern for possible pneumonia.  Patient was stable at that time and was not transported.  Patient does use home oxygen periodically last use was over 6 months ago.  Pulse oximetry today was 81% on room air.  Patient placed on 2 L of oxygen here and pulse ox is now 92%.       Home Medications Prior to Admission medications   Medication Sig Start Date End Date Taking? Authorizing Provider  ACCU-CHEK AVIVA PLUS test strip USE AS INSTRUCTED TO CHECK BLOOD SUGAR THREE TIMES DAILY 11/11/16   Reather Littler, MD  acetaminophen (TYLENOL) 325 MG tablet Take 650 mg by mouth every 6 (six) hours as needed for moderate pain.    [provider]  albuterol (VENTOLIN HFA) 108 (90 Base) MCG/ACT inhaler Inhale 2 puffs into the lungs every 6 (six) hours as needed for wheezing or shortness of breath. 07/25/22   Lanae Boast, MD  amLODipine (NORVASC) 10 MG tablet Take 1 tablet (10 mg total) by mouth daily. 10/30/17   Shon Hale, MD  aspirin 81 MG tablet Take 81 mg by mouth at bedtime.     [provider]  atorvastatin (LIPITOR) 20 MG tablet Take 20 mg by mouth daily. 12/16/18   [provider]  Blood Glucose Monitoring Suppl (ACCU-CHEK AVIVA PLUS) w/Device KIT Use to check blood sugar 3 times per day dx code E11.65 03/12/16   Reather Littler, MD  carbidopa-levodopa (SINEMET IR) 25-100 MG tablet Take 1 pill in the morning, 1 pill in the afternoon, and 2 pills at night 09/17/22   Glean Salvo, NP  donepezil  (ARICEPT) 10 MG tablet Take 1 tablet (10 mg total) by mouth at bedtime. 09/17/22   Glean Salvo, NP  dorzolamide-timolol (COSOPT) 22.3-6.8 MG/ML ophthalmic solution Place 1 drop into the right eye 2 (two) times daily.    [provider]  escitalopram (LEXAPRO) 10 MG tablet Take 1 tablet (10 mg total) by mouth daily. 09/17/22   Glean Salvo, NP  ferrous sulfate 325 (65 FE) MG tablet Take 1 tablet (325 mg total) by mouth 2 (two) times daily. 10/30/17   Shon Hale, MD  fish oil-omega-3 fatty acids 1000 MG capsule Take 1 g by mouth daily.     [provider]  GEMTESA 75 MG TABS Take 75 mg by mouth daily. 09/03/21   [provider]  guaifenesin (ROBITUSSIN) 100 MG/5ML syrup Take 200 mg by mouth 3 (three) times daily as needed for cough.    [provider]  indomethacin (INDOCIN) 50 MG capsule Take 1 capsule (50 mg total) by mouth 2 (two) times daily as needed (gout). Take with food Patient not taking: Reported on 10/14/2022 12/24/19   Dahlia Byes A, NP  Insulin Syringe-Needle U-100 (INSULIN SYRINGE .5CC/31GX5/16") 31G X 5/16" 0.5 ML MISC Use one to inject insulin daily 05/02/15   Reather Littler, MD  latanoprost (XALATAN) 0.005 % ophthalmic solution Place 1 drop into the left eye at  bedtime. 02/17/14   [provider]  loperamide (IMODIUM A-D) 2 MG tablet Take 2 mg by mouth 4 (four) times daily as needed for diarrhea or loose stools.    [provider]  meclizine (ANTIVERT) 25 MG tablet Take 1 tablet (25 mg total) by mouth 3 (three) times daily as needed for dizziness. 07/05/16   Dione Booze, MD  memantine (NAMENDA) 10 MG tablet Take 1 tablet (10 mg total) by mouth 2 (two) times daily. 09/17/22   Glean Salvo, NP  metoprolol tartrate (LOPRESSOR) 50 MG tablet Take 50 mg by mouth 2 (two) times daily.    [provider]  Multiple Vitamins-Minerals (CENTRUM SILVER ADULT 50+) TABS Take 1 tablet by mouth daily.    [provider]  Multiple  Vitamins-Minerals (PRESERVISION AREDS) TABS Take 1 capsule by mouth daily.    [provider]  NOVOLIN 70/30 (70-30) 100 UNIT/ML injection Inject 5-12 Units into the skin 3 (three) times daily as needed (high blood sugar). 05/16/19   [provider]  probenecid (BENEMID) 500 MG tablet Take 500 mg by mouth 2 (two) times daily. 08/06/22   [provider]  tamsulosin (FLOMAX) 0.4 MG CAPS capsule Take 1 capsule (0.4 mg total) by mouth at bedtime. 10/30/17   Shon Hale, MD  traMADol (ULTRAM) 50 MG tablet Take 1-2 tablets (50-100 mg total) by mouth every 6 (six) hours as needed for moderate pain. 10/29/22   Crist Fat, MD      Allergies    Ace inhibitors and Allopurinol    Review of Systems   Review of Systems  All other systems reviewed and are negative.   Physical Exam Updated Vital Signs BP (!) 164/74   Pulse 79   Temp 98.3 F (36.8 C)   Resp 18   Ht 1.753 m (5\' 9" )   Wt 81.6 kg   SpO2 92%   BMI 26.57 kg/m  Physical Exam Vitals and nursing note reviewed.  Constitutional:      General: He is not in acute distress.    Appearance: Normal appearance. He is well-developed. He is not toxic-appearing.  HENT:     Head: Normocephalic and atraumatic.  Eyes:     General: Lids are normal.     Conjunctiva/sclera: Conjunctivae normal.     Pupils: Pupils are equal, round, and reactive to light.  Neck:     Thyroid: No thyroid mass.     Trachea: No tracheal deviation.  Cardiovascular:     Rate and Rhythm: Normal rate and regular rhythm.     Heart sounds: Normal heart sounds. No murmur heard.    No gallop.  Pulmonary:     Effort: Pulmonary effort is normal. No respiratory distress.     Breath sounds: No stridor. Examination of the right-lower field reveals decreased breath sounds. Examination of the left-lower field reveals decreased breath sounds. Decreased breath sounds present. No wheezing, rhonchi or rales.  Abdominal:     General: There is no  distension.     Palpations: Abdomen is soft.     Tenderness: There is no abdominal tenderness. There is no rebound.  Musculoskeletal:        General: No tenderness. Normal range of motion.     Cervical back: Normal range of motion and neck supple.  Skin:    General: Skin is warm and dry.     Findings: No abrasion or rash.  Neurological:     Mental Status: He is alert and oriented to  person, place, and time. Mental status is at baseline.     GCS: GCS eye subscore is 4. GCS verbal subscore is 5. GCS motor subscore is 6.     Cranial Nerves: No cranial nerve deficit.     Sensory: No sensory deficit.     Motor: Motor function is intact.  Psychiatric:        Attention and Perception: Attention normal.        Speech: Speech normal.        Behavior: Behavior normal.     ED Results / Procedures / Treatments   Labs (all labs ordered are listed, but only abnormal results are displayed) Labs Reviewed  RESP PANEL BY RT-PCR (RSV, FLU A&B, COVID)  [id number]  BASIC METABOLIC PANEL  CBC  TROPONIN I (HIGH SENSITIVITY)    EKG EKG Interpretation Date/Time:  Thursday April 16 2023 11:25:47 EST Ventricular Rate:  78 PR Interval:  146 QRS Duration:  85 QT Interval:  418 QTC Calculation: 477 R Axis:   30  Text Interpretation: Sinus rhythm Probable LVH with secondary repol abnrm Borderline prolonged QT interval No significant change since last tracing Confirmed by Lorre Nick ([id number]) on 04/16/2023 12:11:33 PM  Radiology No results found.  Procedures Procedures    Medications Ordered in ED Medications - No data to display  ED Course/ Medical Decision Making/ A&P                                 Medical Decision Making Amount and/or Complexity of Data Reviewed Labs: ordered. Radiology: ordered.   Patient's EKG shows normal sinus rhythm.  Chest x-ray consistent with multi lobar pneumonia.  COVID test is positive here.  Mild elevation of D-dimer likely from patient's pulmonary  process.  Also could be from the positive COVID result as well 2.  He is on oxygen at this time.  Endorses increased weakness.  Mild elevation of his BNP at 336.  Troponin negative.  Patient will require hospitalization will consult hospitalist team        Final Clinical Impression(s) / ED Diagnoses Final diagnoses:  None    Rx / DC Orders ED Discharge Orders     None         Lorre Nick, MD 04/16/23 1254

## 2023-04-16 NOTE — Assessment & Plan Note (Addendum)
Calcitonin negative, currently no fever and no WBC elevation.  I will add on a differential check. However at this time, concern for a bacterial infection is very low.  Patient s/p remdesivir from the ER.  Will continue with remdesivir 100 mg starting tomorrow.  Patient also received 10 mg of dexamethasone.  I will continue the same at 6 mg tomorrow.  GI and DVT prophylaxis will be ordered.  Dimer elevated due to inflammation from COVID-19, age as well as CKD

## 2023-04-16 NOTE — ED Notes (Signed)
[facility] called for transport 13:51

## 2023-04-16 NOTE — ED Triage Notes (Signed)
Pt via pvo from home with cp and sob and fever x 2 days. Family reports they called ems last night and they decided to take him to pcp today; they told him to come to ED. Pt O2 sat in triage 84%; placed on [place] with 2L flow. Pt alert & oriented, nad noted.

## 2023-04-16 NOTE — ED Notes (Signed)
Swab was completed and sent to lab.

## 2023-04-16 NOTE — ED Notes (Signed)
Patient's O 2 has been running low. Has a history of COPD was prescribed oxygen at home but does not wear it anymore. Sitting at 83-84 in triage at this time. Reported to RN Corrie Dandy to get a oxygen order.

## 2023-04-16 NOTE — ED Notes (Signed)
Returned from xray

## 2023-04-16 NOTE — Plan of Care (Signed)
  Problem: Education: Goal: Knowledge of risk factors and measures for prevention of condition will improve Outcome: Progressing   Problem: Coping: Goal: Psychosocial and spiritual needs will be supported Outcome: Progressing   

## 2023-04-16 NOTE — Plan of Care (Signed)
  Problem: Respiratory: Goal: Will maintain a patent airway Outcome: Progressing   Problem: Clinical Measurements: Goal: Respiratory complications will improve Outcome: Progressing   Problem: Activity: Goal: Risk for activity intolerance will decrease Outcome: Progressing   Problem: Coping: Goal: Level of anxiety will decrease Outcome: Progressing   Problem: Elimination: Goal: Will not experience complications related to urinary retention Outcome: Progressing

## 2023-04-16 NOTE — H&P (Signed)
History and Physical    Patient: [name] KZS:[id number] DOB: 04-12-1941 DOA: 04/16/2023 DOS: the patient was seen and examined on 04/16/2023 PCP: Laurann Montana, MD  Patient coming from: Home  Chief Complaint: No chief complaint on file.  HPI: [name] is a 82 y.o. male with medical history significant of COPD, documented dementia.  However patient does not use supplementary oxygen at home.  Patient is the principal historian for this encounter with the author.  Patient reports being in his usual state of health till about 4 5 days ago when he reports a new onset of cough with very scant sputum production.  And he got increasingly short of breath.  There is no report of documented fever or leg swelling or chest pain except when patient would cough.  Per further reports: Patient was evaluated by PCP/EMS today noted to be hypoxic 81% on RA and sent to the [facility] ER. Work up as noted below.  Patient has been stabilized on 2 L/min of supplementary oxygen.  At this time patient offers no particular complaint as he is not short of breath at least at rest and is feeling pretty good and hungry.   Review of Systems: As mentioned in the history of present illness. All other systems reviewed and are negative. Past Medical History:  Diagnosis Date   Arthritis    Bladder cancer (HCC)    in situ   Cataracts, bilateral    CHF (congestive heart failure) (HCC)    Chronic kidney disease    COPD (chronic obstructive pulmonary disease) (HCC)    Dementia (HCC)    Diabetes mellitus    insulin dependent   Glaucoma    Gout    Hearing loss    Heart disease, unspecified    Hypercholesterolemia    Hypertension    Memory changes    Neuromuscular disorder (HCC)    parkinson's   Prostate cancer (HCC)    s/p radiation   Sarcoidosis    Stroke [facility])    Unspecified glaucoma(365.9)    Past Surgical History:  Procedure Laterality Date   BLADDER SURGERY     for carcinoma   CATARACT EXTRACTION  Bilateral 2011   CIRCUMCISION N/A 10/29/2022   Procedure: CIRCUMCISION ADULT;  Surgeon: Crist Fat, MD;  Location: [facility] ORS;  Service: Urology;  Laterality: N/A;  60 MINUTES   EYE SURGERY Bilateral    pressure surgery related to glaucoma   knee replacement surgery  1999   right   PROSTATECTOMY  2013   Social History:  reports that he quit smoking about 17 years ago. His smoking use included cigarettes. He has never used smokeless tobacco. He reports that he does not drink alcohol and does not use drugs.  Allergies  Allergen Reactions   Ace Inhibitors Swelling    Benazepril   Allopurinol Rash    Family History  Problem Relation Age of Onset   Hypertension Mother    Diabetes Mellitus I Mother    Hypertension Father    Coronary artery disease Father    Hypertension Sister    Hypertension Brother    Coronary artery disease Brother    Diabetes Mellitus I Brother    Diabetes Mellitus I Brother    Cancer Neg Hx     Prior to Admission medications   Medication Sig Start Date End Date Taking? Authorizing Provider  ACCU-CHEK AVIVA PLUS test strip USE AS INSTRUCTED TO CHECK BLOOD SUGAR THREE TIMES DAILY 11/11/16   Reather Littler, MD  acetaminophen (TYLENOL) 325 MG tablet Take 650 mg by mouth every 6 (six) hours as needed for moderate pain.    [provider]  albuterol (VENTOLIN HFA) 108 (90 Base) MCG/ACT inhaler Inhale 2 puffs into the lungs every 6 (six) hours as needed for wheezing or shortness of breath. 07/25/22   Lanae Boast, MD  amLODipine (NORVASC) 10 MG tablet Take 1 tablet (10 mg total) by mouth daily. 10/30/17   Shon Hale, MD  aspirin 81 MG tablet Take 81 mg by mouth at bedtime.     [provider]  atorvastatin (LIPITOR) 20 MG tablet Take 20 mg by mouth daily. 12/16/18   [provider]  Blood Glucose Monitoring Suppl (ACCU-CHEK AVIVA PLUS) w/Device KIT Use to check blood sugar 3 times per day dx code E11.65 03/12/16   Reather Littler, MD   carbidopa-levodopa (SINEMET IR) 25-100 MG tablet Take 1 pill in the morning, 1 pill in the afternoon, and 2 pills at night 09/17/22   Glean Salvo, NP  donepezil (ARICEPT) 10 MG tablet Take 1 tablet (10 mg total) by mouth at bedtime. 09/17/22   Glean Salvo, NP  dorzolamide-timolol (COSOPT) 22.3-6.8 MG/ML ophthalmic solution Place 1 drop into the right eye 2 (two) times daily.    [provider]  escitalopram (LEXAPRO) 10 MG tablet Take 1 tablet (10 mg total) by mouth daily. 09/17/22   Glean Salvo, NP  ferrous sulfate 325 (65 FE) MG tablet Take 1 tablet (325 mg total) by mouth 2 (two) times daily. 10/30/17   Shon Hale, MD  fish oil-omega-3 fatty acids 1000 MG capsule Take 1 g by mouth daily.     [provider]  GEMTESA 75 MG TABS Take 75 mg by mouth daily. 09/03/21   [provider]  guaifenesin (ROBITUSSIN) 100 MG/5ML syrup Take 200 mg by mouth 3 (three) times daily as needed for cough.    [provider]  indomethacin (INDOCIN) 50 MG capsule Take 1 capsule (50 mg total) by mouth 2 (two) times daily as needed (gout). Take with food Patient not taking: Reported on 10/14/2022 12/24/19   Dahlia Byes A, NP  Insulin Syringe-Needle U-100 (INSULIN SYRINGE .5CC/31GX5/16") 31G X 5/16" 0.5 ML MISC Use one to inject insulin daily 05/02/15   Reather Littler, MD  latanoprost (XALATAN) 0.005 % ophthalmic solution Place 1 drop into the left eye at bedtime. 02/17/14   [provider]  loperamide (IMODIUM A-D) 2 MG tablet Take 2 mg by mouth 4 (four) times daily as needed for diarrhea or loose stools.    [provider]  meclizine (ANTIVERT) 25 MG tablet Take 1 tablet (25 mg total) by mouth 3 (three) times daily as needed for dizziness. 07/05/16   Dione Booze, MD  memantine (NAMENDA) 10 MG tablet Take 1 tablet (10 mg total) by mouth 2 (two) times daily. 09/17/22   Glean Salvo, NP  metoprolol tartrate (LOPRESSOR) 50 MG tablet Take 50 mg by mouth 2 (two) times  daily.    [provider]  Multiple Vitamins-Minerals (CENTRUM SILVER ADULT 50+) TABS Take 1 tablet by mouth daily.    [provider]  Multiple Vitamins-Minerals (PRESERVISION AREDS) TABS Take 1 capsule by mouth daily.    [provider]  NOVOLIN 70/30 (70-30) 100 UNIT/ML injection Inject 5-12 Units into the skin 3 (three) times daily as needed (high blood sugar). 05/16/19   [provider]  probenecid (BENEMID) 500 MG tablet Take 500 mg by mouth 2 (  two) times daily. 08/06/22   [provider]  tamsulosin (FLOMAX) 0.4 MG CAPS capsule Take 1 capsule (0.4 mg total) by mouth at bedtime. 10/30/17   Shon Hale, MD  traMADol (ULTRAM) 50 MG tablet Take 1-2 tablets (50-100 mg total) by mouth every 6 (six) hours as needed for moderate pain. 10/29/22   Crist Fat, MD    Physical Exam: Vitals:   04/16/23 1423 04/16/23 1424 04/16/23 1541 04/16/23 1544  BP: (!) 167/80  (!) 170/83   Pulse: 90 84 86   Resp: 19 17 20    Temp:   97.8 F (36.6 C)   TempSrc:   Oral   SpO2: 90% 92% 98%   Weight:    85.3 kg  Height:    5\' 9"  (1.753 m)   General: Patient is alert and awake and pleasantly interactive.  On 2 L/min of supplementary oxygen.  Does not appear distressed. Respiratory exam: Bilateral intravesicular, fine basilar crackles posteriorly. Cardiovascular exam S1-S2 normal Abdomen all quadrants soft nontender Extremities warm without edema no focal motor deficit.  Patient follows directions with all 4 extremities. Data Reviewed:  Labs on Admission:  Results for orders placed or performed during the hospital encounter of 04/16/23 (from the past 24 hours)  Resp panel by RT-PCR (RSV, Flu A&B, Covid) Anterior Nasal Swab     Status: Abnormal   Collection Time: 04/16/23 11:24 AM   Specimen: Anterior Nasal Swab  Result Value Ref Range   SARS Coronavirus 2 by RT PCR POSITIVE (A) NEGATIVE   Influenza A by PCR NEGATIVE NEGATIVE   Influenza B by PCR  NEGATIVE NEGATIVE   Resp Syncytial Virus by PCR NEGATIVE NEGATIVE  Basic metabolic panel     Status: Abnormal   Collection Time: 04/16/23 11:36 AM  Result Value Ref Range   Sodium 145 135 - 145 mmol/L   Potassium 3.4 (L) 3.5 - 5.1 mmol/L   Chloride 109 98 - 111 mmol/L   CO2 27 22 - 32 mmol/L   Glucose, Bld 180 (H) 70 - 99 mg/dL   BUN 18 8 - 23 mg/dL   Creatinine, Ser 0.98 (H) 0.61 - 1.24 mg/dL   Calcium 9.0 8.9 - 11.9 mg/dL   GFR, Estimated 57 (L) >60 mL/min   Anion gap 9 5 - 15  CBC     Status: Abnormal   Collection Time: 04/16/23 11:36 AM  Result Value Ref Range   WBC 6.1 4.0 - 10.5 K/uL   RBC 4.52 4.22 - 5.81 MIL/uL   Hemoglobin 11.2 (L) 13.0 - 17.0 g/dL   HCT 14.7 (L) 82.9 - 56.2 %   MCV 77.7 (L) 80.0 - 100.0 fL   MCH 24.8 (L) 26.0 - 34.0 pg   MCHC 31.9 30.0 - 36.0 g/dL   RDW 13.0 (H) 86.5 - 78.4 %   Platelets 145 (L) 150 - 400 K/uL   nRBC 0.0 0.0 - 0.2 %  Troponin I (High Sensitivity)     Status: None   Collection Time: 04/16/23 11:36 AM  Result Value Ref Range   Troponin I (High Sensitivity) 17 <18 ng/L  Brain natriuretic peptide     Status: Abnormal   Collection Time: 04/16/23 11:36 AM  Result Value Ref Range   B Natriuretic Peptide 336.2 (H) 0.0 - 100.0 pg/mL  D-dimer, quantitative     Status: Abnormal   Collection Time: 04/16/23 11:43 AM  Result Value Ref Range   D-Dimer, Quant 1.38 (H) 0.00 - 0.50 ug/mL-FEU  Procalcitonin  Status: None   Collection Time: 04/16/23  1:38 PM  Result Value Ref Range   Procalcitonin <0.10 ng/mL  Magnesium     Status: None   Collection Time: 04/16/23  1:38 PM  Result Value Ref Range   Magnesium 1.8 1.7 - 2.4 mg/dL  Phosphorus     Status: None   Collection Time: 04/16/23  1:38 PM  Result Value Ref Range   Phosphorus 3.5 2.5 - 4.6 mg/dL  Troponin I (High Sensitivity)     Status: Abnormal   Collection Time: 04/16/23  2:16 PM  Result Value Ref Range   Troponin I (High Sensitivity) 18 (H) <18 ng/L  CBC     Status: Abnormal    Collection Time: 04/16/23  4:42 PM  Result Value Ref Range   WBC 5.5 4.0 - 10.5 K/uL   RBC 4.52 4.22 - 5.81 MIL/uL   Hemoglobin 11.6 (L) 13.0 - 17.0 g/dL   HCT 13.0 (L) 86.5 - 78.4 %   MCV 78.8 (L) 80.0 - 100.0 fL   MCH 25.7 (L) 26.0 - 34.0 pg   MCHC 32.6 30.0 - 36.0 g/dL   RDW 69.6 (H) 29.5 - 28.4 %   Platelets 144 (L) 150 - 400 K/uL   nRBC 0.0 0.0 - 0.2 %  Creatinine, serum     Status: None   Collection Time: 04/16/23  4:42 PM  Result Value Ref Range   Creatinine, Ser 1.07 0.61 - 1.24 mg/dL   GFR, Estimated >13 >24 mL/min   Basic Metabolic Panel: Recent Labs  Lab 04/16/23 1136 04/16/23 1338 04/16/23 1642  NA 145  --   --   K 3.4*  --   --   CL 109  --   --   CO2 27  --   --   GLUCOSE 180*  --   --   BUN 18  --   --   CREATININE 1.27*  --  1.07  CALCIUM 9.0  --   --   MG  --  1.8  --   PHOS  --  3.5  --    Liver Function Tests: No results for input(s): "AST", "ALT", "ALKPHOS", "BILITOT", "PROT", "ALBUMIN" in the last 168 hours. No results for input(s): "LIPASE", "AMYLASE" in the last 168 hours. No results for input(s): "AMMONIA" in the last 168 hours. CBC: Recent Labs  Lab 04/16/23 1136 04/16/23 1642  WBC 6.1 5.5  HGB 11.2* 11.6*  HCT 35.1* 35.6*  MCV 77.7* 78.8*  PLT 145* 144*   Cardiac Enzymes: Recent Labs  Lab 04/16/23 1136 04/16/23 1416  TROPONINIHS 17 18*    BNP (last 3 results) No results for input(s): "PROBNP" in the last 8760 hours. CBG: No results for input(s): "GLUCAP" in the last 168 hours.  Radiological Exams on Admission:  DG Chest 2 View Result Date: 04/16/2023 CLINICAL DATA:  Chest pain, shortness of breath, and fever for the past 2 days. EXAM: CHEST - 2 VIEW COMPARISON:  Chest x-ray dated July 25, 2022. FINDINGS: Normal heart size. Peripheral hazy airspace opacities in both mid lungs. No pleural effusion or pneumothorax. No acute osseous abnormality. IMPRESSION: 1. Peripheral hazy airspace opacities in both mid lungs, concerning for  multifocal pneumonia. Electronically Signed   By: Obie Dredge M.D.   On: 04/16/2023 12:09    EKG: rhythm strip reviewed. NSR  Assessment and Plan: * Acute respiratory failure due to COVID-19 [facility]) Calcitonin negative, currently no fever and no WBC elevation.  I will add on  a differential check. However at this time, concern for a bacterial infection is very low.  Patient s/p remdesivir from the ER.  Will continue with remdesivir 100 mg starting tomorrow.  Patient also received 10 mg of dexamethasone.  I will continue the same at 6 mg tomorrow.  GI and DVT prophylaxis will be ordered.  Dimer elevated due to inflammation from COVID-19, age as well as CKD  Needing 2lpm suplementary oxygen  Please review med rec after pharmacy input. I have ordered sinemet and , tamsulosin and aspirin based on prior med list.   Advance Care Planning:   Code Status: Full Code   Consults: none at this time.  Family Communication: family aware of admission.  Severity of Illness: The appropriate patient status for this patient is INPATIENT. Inpatient status is judged to be reasonable and necessary in order to provide the required intensity of service to ensure the patient's safety. The patient's presenting symptoms, physical exam findings, and initial radiographic and laboratory data in the context of their chronic comorbidities is felt to place them at high risk for further clinical deterioration. Furthermore, it is not anticipated that the patient will be medically stable for discharge from the hospital within 2 midnights of admission.   * I certify that at the point of admission it is my clinical judgment that the patient will require inpatient hospital care spanning beyond 2 midnights from the point of admission due to high intensity of service, high risk for further deterioration and high frequency of surveillance required.*  Author: Nolberto Hanlon, MD 04/16/2023 5:45 PM  For on call review  [email or web address].

## 2023-04-17 DIAGNOSIS — U071 COVID-19: Secondary | ICD-10-CM | POA: Diagnosis not present

## 2023-04-17 DIAGNOSIS — J9600 Acute respiratory failure, unspecified whether with hypoxia or hypercapnia: Secondary | ICD-10-CM | POA: Diagnosis not present

## 2023-04-17 LAB — APTT: aPTT: 28 s (ref 24–36)

## 2023-04-17 LAB — CBC WITH DIFFERENTIAL/PLATELET
Abs Immature Granulocytes: 0.01 10*3/uL (ref 0.00–0.07)
Basophils Absolute: 0 10*3/uL (ref 0.0–0.1)
Basophils Relative: 0 %
Eosinophils Absolute: 0 10*3/uL (ref 0.0–0.5)
Eosinophils Relative: 0 %
HCT: 33.9 % — ABNORMAL LOW (ref 39.0–52.0)
Hemoglobin: 10.6 g/dL — ABNORMAL LOW (ref 13.0–17.0)
Immature Granulocytes: 0 %
Lymphocytes Relative: 20 %
Lymphs Abs: 1.1 10*3/uL (ref 0.7–4.0)
MCH: 25.1 pg — ABNORMAL LOW (ref 26.0–34.0)
MCHC: 31.3 g/dL (ref 30.0–36.0)
MCV: 80.1 fL (ref 80.0–100.0)
Monocytes Absolute: 0.6 10*3/uL (ref 0.1–1.0)
Monocytes Relative: 12 %
Neutro Abs: 3.6 10*3/uL (ref 1.7–7.7)
Neutrophils Relative %: 68 %
Platelets: 150 10*3/uL (ref 150–400)
RBC: 4.23 MIL/uL (ref 4.22–5.81)
RDW: 16.9 % — ABNORMAL HIGH (ref 11.5–15.5)
WBC: 5.3 10*3/uL (ref 4.0–10.5)
nRBC: 0 % (ref 0.0–0.2)

## 2023-04-17 LAB — GLUCOSE, CAPILLARY
Glucose-Capillary: 267 mg/dL — ABNORMAL HIGH (ref 70–99)
Glucose-Capillary: 326 mg/dL — ABNORMAL HIGH (ref 70–99)

## 2023-04-17 LAB — BASIC METABOLIC PANEL
Anion gap: 11 (ref 5–15)
BUN: 19 mg/dL (ref 8–23)
CO2: 21 mmol/L — ABNORMAL LOW (ref 22–32)
Calcium: 8.5 mg/dL — ABNORMAL LOW (ref 8.9–10.3)
Chloride: 106 mmol/L (ref 98–111)
Creatinine, Ser: 0.93 mg/dL (ref 0.61–1.24)
GFR, Estimated: >60 mL/min (ref >60)
Glucose, Bld: 332 mg/dL — ABNORMAL HIGH (ref 70–99)
Potassium: 3.9 mmol/L (ref 3.5–5.1)
Sodium: 138 mmol/L (ref 135–145)

## 2023-04-17 LAB — HEPATIC FUNCTION PANEL
ALT: 5 U/L (ref 0–44)
AST: 11 U/L — ABNORMAL LOW (ref 15–41)
Albumin: 2.9 g/dL — ABNORMAL LOW (ref 3.5–5.0)
Alkaline Phosphatase: 65 U/L (ref 38–126)
Bilirubin, Direct: 0.1 mg/dL (ref 0.0–0.2)
Indirect Bilirubin: 0.6 mg/dL (ref 0.3–0.9)
Total Bilirubin: 0.7 mg/dL (ref <1.2)
Total Protein: 6.8 g/dL (ref 6.5–8.1)

## 2023-04-17 LAB — PROTIME-INR
INR: 1.2 (ref 0.8–1.2)
Prothrombin Time: 15.3 s — ABNORMAL HIGH (ref 11.4–15.2)

## 2023-04-17 MED ORDER — LATANOPROST 0.005 % OP SOLN
1.0000 [drp] | Freq: Every day | OPHTHALMIC | Status: DC
  Administered 2023-04-17 – 2023-04-18 (×2): 1 [drp] via OPHTHALMIC
  Filled 2023-04-17: qty 2.5

## 2023-04-17 MED ORDER — DORZOLAMIDE HCL-TIMOLOL MAL 2-0.5 % OP SOLN
1.0000 [drp] | Freq: Two times a day (BID) | OPHTHALMIC | Status: DC
  Administered 2023-04-17 – 2023-04-19 (×4): 1 [drp] via OPHTHALMIC
  Filled 2023-04-17: qty 10

## 2023-04-17 MED ORDER — METOPROLOL TARTRATE 50 MG PO TABS
50.0000 mg | ORAL_TABLET | Freq: Two times a day (BID) | ORAL | Status: DC
  Administered 2023-04-17 – 2023-04-19 (×5): 50 mg via ORAL
  Filled 2023-04-17 (×5): qty 1

## 2023-04-17 MED ORDER — INSULIN GLARGINE-YFGN 100 UNIT/ML ~~LOC~~ SOLN
10.0000 [IU] | Freq: Every day | SUBCUTANEOUS | Status: DC
  Administered 2023-04-18: 10 [IU] via SUBCUTANEOUS
  Filled 2023-04-17 (×2): qty 0.1

## 2023-04-17 MED ORDER — MEMANTINE HCL 10 MG PO TABS
10.0000 mg | ORAL_TABLET | Freq: Two times a day (BID) | ORAL | Status: DC
  Administered 2023-04-17 – 2023-04-19 (×5): 10 mg via ORAL
  Filled 2023-04-17 (×5): qty 1

## 2023-04-17 MED ORDER — DONEPEZIL HCL 10 MG PO TABS
10.0000 mg | ORAL_TABLET | Freq: Every day | ORAL | Status: DC
  Administered 2023-04-17 – 2023-04-18 (×2): 10 mg via ORAL
  Filled 2023-04-17 (×2): qty 1

## 2023-04-17 NOTE — Plan of Care (Signed)
  Problem: Education: Goal: Knowledge of risk factors and measures for prevention of condition will improve Outcome: Progressing   Problem: Coping: Goal: Psychosocial and spiritual needs will be supported Outcome: Progressing   Problem: Respiratory: Goal: Will maintain a patent airway Outcome: Progressing Goal: Complications related to the disease process, condition or treatment will be avoided or minimized Outcome: Progressing   Problem: Education: Goal: Knowledge of General Education information will improve Description: Including pain rating scale, medication(s)/side effects and non-pharmacologic comfort measures Outcome: Progressing   Problem: Health Behavior/Discharge Planning: Goal: Ability to manage health-related needs will improve Outcome: Progressing   Problem: Clinical Measurements: Goal: Ability to maintain clinical measurements within normal limits will improve Outcome: Progressing Goal: Will remain free from infection Outcome: Progressing Goal: Diagnostic test results will improve Outcome: Progressing Goal: Respiratory complications will improve Outcome: Progressing Goal: Cardiovascular complication will be avoided Outcome: Progressing   Problem: Activity: Goal: Risk for activity intolerance will decrease Outcome: Progressing   Problem: Nutrition: Goal: Adequate nutrition will be maintained Outcome: Progressing   Problem: Coping: Goal: Level of anxiety will decrease Outcome: Progressing   Problem: Elimination: Goal: Will not experience complications related to bowel motility Outcome: Progressing Goal: Will not experience complications related to urinary retention Outcome: Progressing   Problem: Pain Management: Goal: General experience of comfort will improve Outcome: Progressing   Problem: Safety: Goal: Ability to remain free from injury will improve Outcome: Progressing   Problem: Skin Integrity: Goal: Risk for impaired skin integrity will  decrease Outcome: Progressing   Problem: Education: Goal: Ability to describe self-care measures that may prevent or decrease complications (Diabetes Survival Skills Education) will improve Outcome: Progressing Goal: Individualized Educational Video(s) Outcome: Progressing   Problem: Coping: Goal: Ability to adjust to condition or change in health will improve Outcome: Progressing   Problem: Fluid Volume: Goal: Ability to maintain a balanced intake and output will improve Outcome: Progressing   Problem: Health Behavior/Discharge Planning: Goal: Ability to identify and utilize available resources and services will improve Outcome: Progressing Goal: Ability to manage health-related needs will improve Outcome: Progressing   Problem: Metabolic: Goal: Ability to maintain appropriate glucose levels will improve Outcome: Progressing   Problem: Nutritional: Goal: Maintenance of adequate nutrition will improve Outcome: Progressing Goal: Progress toward achieving an optimal weight will improve Outcome: Progressing   Problem: Skin Integrity: Goal: Risk for impaired skin integrity will decrease Outcome: Progressing   Problem: Tissue Perfusion: Goal: Adequacy of tissue perfusion will improve Outcome: Progressing

## 2023-04-17 NOTE — Progress Notes (Addendum)
PROGRESS NOTE    [name]  ZOX:[id number] DOB: Sep 09, 1940 DOA: 04/16/2023 PCP: Laurann Montana, MD   Brief Narrative: 82 year old with past medical history significant for COPD, documented dementia who developed cough, increased shortness of breath for 5 days prior to admission.  Evaluation in the ED he was noted to be hypoxic oxygen saturation 81 on room air.  He was found to have COVID-19 pneumonia.   Assessment & Plan:   Principal Problem:   Acute respiratory failure due to COVID-19 (HCC)   1-Acute hypoxic respiratory failure due to COVID-19 pneumonia -Patient presented with hypoxia oxygen saturation 80% on room air, COVID-19 positive, chest x-ray: Peripheral hazy airspace opacities in both mid lungs, concerning for multifocal pneumonia. -Continue with IV Remdesivir.  -Continue with Dexamethasone.   2-Diabetes type 2: Continue with a sliding scale insulin  Vascular dementia: Namenda and Aricept Parkinsonism.  On carbidopa levodopa  Hypertension: Resume Metoprolol.   Hyperlipidemia: Continue with Lipitor.   History of prostate and bladder cancer status post prostatectomy and radiation Sarcoidosis        Estimated body mass index is 27.76 kg/m as calculated from the following:   Height as of this encounter: 5\' 9"  (1.753 m).   Weight as of this encounter: 85.3 kg.   DVT prophylaxis: Lovenox Code Status: Code Family Communication: Care discussed with patient Disposition Plan:  Status is: Inpatient Remains inpatient appropriate because: Management of Covid    Consultants:  none  Procedures:  none Antimicrobials:    Subjective: He is alert, reports feeling a to be better today.  Reports cough.   Objective: Vitals:   04/16/23 1544 04/16/23 2034 04/17/23 0004 04/17/23 0431  BP:  (!) 147/81 (!) 146/74 (!) 147/70  Pulse:  98 88 86  Resp:      Temp:  98.9 F (37.2 C) 98.4 F (36.9 C) 97.8 F (36.6 C)  TempSrc:  Oral Oral Oral  SpO2:   94% 92%   Weight: 85.3 kg     Height: 5\' 9"  (1.753 m)       Intake/Output Summary (Last 24 hours) at 04/17/2023 0835 Last data filed at 04/17/2023 0600 Gross per 24 hour  Intake 461.98 ml  Output 200 ml  Net 261.98 ml   Filed Weights   04/16/23 1117 04/16/23 1544  Weight: 81.6 kg 85.3 kg    Examination:  General exam: Appears calm and comfortable  Respiratory system: Lateral rhonchus Cardiovascular system: S1 & S2 heard, RRR. No JVD, murmurs, rubs, gallops or clicks. No pedal edema. Gastrointestinal system: Abdomen is nondistended, soft and nontender. No organomegaly or masses felt. Normal bowel sounds heard. Central nervous system: Alert and oriented.  Extremities: Symmetric 5 x 5 power.  Data Reviewed: I have personally reviewed following labs and imaging studies  CBC: Recent Labs  Lab 04/16/23 1136 04/16/23 1642 04/17/23 0427  WBC 6.1 5.5 5.3  NEUTROABS  --  4.4 3.6  HGB 11.2* 11.6* 10.6*  HCT 35.1* 35.6* 33.9*  MCV 77.7* 78.8* 80.1  PLT 145* 144* 150   Basic Metabolic Panel: Recent Labs  Lab 04/16/23 1136 04/16/23 1338 04/16/23 1642 04/17/23 0427  NA 145  --   --  138  K 3.4*  --   --  3.9  CL 109  --   --  106  CO2 27  --   --  21*  GLUCOSE 180*  --   --  332*  BUN 18  --   --  19  CREATININE 1.27*  --  1.07 0.93  CALCIUM 9.0  --   --  8.5*  MG  --  1.8  --   --   PHOS  --  3.5  --   --    GFR: Estimated Creatinine Clearance: 68.5 mL/min (by C-G formula based on SCr of 0.93 mg/dL). Liver Function Tests: Recent Labs  Lab 04/17/23 0427  AST 11*  ALT 5  ALKPHOS 65  BILITOT 0.7  PROT 6.8  ALBUMIN 2.9*   No results for input(s): "LIPASE", "AMYLASE" in the last 168 hours. No results for input(s): "AMMONIA" in the last 168 hours. Coagulation Profile: Recent Labs  Lab 04/17/23 0427  INR 1.2   Cardiac Enzymes: No results for input(s): "CKTOTAL", "CKMB", "CKMBINDEX", "TROPONINI" in the last 168 hours. BNP (last 3 results) No results for input(s):  "PROBNP" in the last 8760 hours. HbA1C: No results for input(s): "HGBA1C" in the last 72 hours. CBG: Recent Labs  Lab 04/16/23 2035 04/17/23 0742  GLUCAP 326* 267*   Lipid Profile: No results for input(s): "CHOL", "HDL", "LDLCALC", "TRIG", "CHOLHDL", "LDLDIRECT" in the last 72 hours. Thyroid Function Tests: No results for input(s): "TSH", "T4TOTAL", "FREET4", "T3FREE", "THYROIDAB" in the last 72 hours. Anemia Panel: No results for input(s): "VITAMINB12", "FOLATE", "FERRITIN", "TIBC", "IRON", "RETICCTPCT" in the last 72 hours. Sepsis Labs: Recent Labs  Lab 04/16/23 1338  PROCALCITON <0.10    Recent Results (from the past 240 hours)  Resp panel by RT-PCR (RSV, Flu A&B, Covid) Anterior Nasal Swab     Status: Abnormal   Collection Time: 04/16/23 11:24 AM   Specimen: Anterior Nasal Swab  Result Value Ref Range Status   SARS Coronavirus 2 by RT PCR POSITIVE (A) NEGATIVE Final    Comment: (NOTE) SARS-CoV-2 target nucleic acids are DETECTED.  The SARS-CoV-2 RNA is generally detectable in upper respiratory specimens during the acute phase of infection. Positive results are indicative of the presence of the identified virus, but do not rule out bacterial infection or co-infection with other pathogens not detected by the test. Clinical correlation with patient history and other diagnostic information is necessary to determine patient infection status. The expected result is Negative.  Fact Sheet for Patients: BloggerCourse.com  Fact Sheet for Healthcare Providers: SeriousBroker.it  This test is not yet approved or cleared by the [place] FDA and  has been authorized for detection and/or diagnosis of SARS-CoV-2 by FDA under an Emergency Use Authorization (EUA).  This EUA will remain in effect (meaning this test can be used) for the duration of  the COVID-19 declaration under Section 564(b)(1) of the A ct, 21 U.S.C. section  360bbb-3(b)(1), unless the authorization is terminated or revoked sooner.     Influenza A by PCR NEGATIVE NEGATIVE Final   Influenza B by PCR NEGATIVE NEGATIVE Final    Comment: (NOTE) The Xpert Xpress SARS-CoV-2/FLU/RSV plus assay is intended as an aid in the diagnosis of influenza from Nasopharyngeal swab specimens and should not be used as a sole basis for treatment. Nasal washings and aspirates are unacceptable for Xpert Xpress SARS-CoV-2/FLU/RSV testing.  Fact Sheet for Patients: BloggerCourse.com  Fact Sheet for Healthcare Providers: SeriousBroker.it  This test is not yet approved or cleared by the [place] FDA and has been authorized for detection and/or diagnosis of SARS-CoV-2 by FDA under an Emergency Use Authorization (EUA). This EUA will remain in effect (meaning this test can be used) for the duration of the COVID-19 declaration under Section 564(b)(1) of the Act, 21 U.S.C. section 360bbb-3(b)(1), unless the  authorization is terminated or revoked.     Resp Syncytial Virus by PCR NEGATIVE NEGATIVE Final    Comment: (NOTE) Fact Sheet for Patients: BloggerCourse.com  Fact Sheet for Healthcare Providers: SeriousBroker.it  This test is not yet approved or cleared by the [place] FDA and has been authorized for detection and/or diagnosis of SARS-CoV-2 by FDA under an Emergency Use Authorization (EUA). This EUA will remain in effect (meaning this test can be used) for the duration of the COVID-19 declaration under Section 564(b)(1) of the Act, 21 U.S.C. section 360bbb-3(b)(1), unless the authorization is terminated or revoked.  Performed at [facility], [address] [place], [place] [address]          Radiology Studies: DG Chest 2 View Result Date: 04/16/2023 CLINICAL DATA:  Chest pain, shortness of breath, and fever for the  past 2 days. EXAM: CHEST - 2 VIEW COMPARISON:  Chest x-ray dated July 25, 2022. FINDINGS: Normal heart size. Peripheral hazy airspace opacities in both mid lungs. No pleural effusion or pneumothorax. No acute osseous abnormality. IMPRESSION: 1. Peripheral hazy airspace opacities in both mid lungs, concerning for multifocal pneumonia. Electronically Signed   By: Obie Dredge M.D.   On: 04/16/2023 12:09        Scheduled Meds:  aspirin EC  81 mg Oral QHS   atorvastatin  20 mg Oral Daily   carbidopa-levodopa  1 tablet Oral BID WC   carbidopa-levodopa  2 tablet Oral QHS   dexamethasone  6 mg Oral Daily   enoxaparin (LOVENOX) injection  40 mg Subcutaneous Q24H   insulin aspart  0-15 Units Subcutaneous TID WC   insulin aspart  0-5 Units Subcutaneous QHS   pantoprazole  20 mg Oral Daily   sodium chloride flush  3 mL Intravenous Q12H   tamsulosin  0.4 mg Oral QHS   Continuous Infusions:  sodium chloride 10 mL/hr at 04/16/23 1412   remdesivir 100 mg in sodium chloride 0.9 % 100 mL IVPB       LOS: 1 day    Time spent: 35 Minutes    Caelynn Marshman A Girtrude Enslin, MD [facility]   If 7PM-7AM, please contact night-coverage [email or web address]  04/17/2023, 8:35 AM

## 2023-04-18 DIAGNOSIS — U071 COVID-19: Secondary | ICD-10-CM | POA: Diagnosis not present

## 2023-04-18 DIAGNOSIS — J9600 Acute respiratory failure, unspecified whether with hypoxia or hypercapnia: Secondary | ICD-10-CM | POA: Diagnosis not present

## 2023-04-18 LAB — HEMOGLOBIN A1C
Hgb A1c MFr Bld: 8.3 % — ABNORMAL HIGH (ref 4.8–5.6)
Mean Plasma Glucose: 192 mg/dL

## 2023-04-18 LAB — GLUCOSE, CAPILLARY
Glucose-Capillary: 198 mg/dL — ABNORMAL HIGH (ref 70–99)
Glucose-Capillary: 217 mg/dL — ABNORMAL HIGH (ref 70–99)
Glucose-Capillary: 309 mg/dL — ABNORMAL HIGH (ref 70–99)
Glucose-Capillary: 384 mg/dL — ABNORMAL HIGH (ref 70–99)
Glucose-Capillary: 345 mg/dL — ABNORMAL HIGH (ref 70–99)

## 2023-04-18 MED ORDER — INSULIN ASPART 100 UNIT/ML IJ SOLN
3.0000 [IU] | Freq: Three times a day (TID) | INTRAMUSCULAR | Status: DC
  Administered 2023-04-18 – 2023-04-19 (×3): 3 [IU] via SUBCUTANEOUS

## 2023-04-18 NOTE — Progress Notes (Signed)
PROGRESS NOTE    [name]  ZOX:[id number] DOB: 11-07-1940 DOA: 04/16/2023 PCP: Laurann Montana, MD   Brief Narrative: 82 year old with past medical history significant for COPD, documented dementia who developed cough, increased shortness of breath for 5 days prior to admission.  Evaluation in the ED he was noted to be hypoxic oxygen saturation 81 on room air.  He was found to have COVID-19 pneumonia.   Assessment & Plan:   Principal Problem:   Acute respiratory failure due to COVID-19 [facility]) Active Problems:   Type 2 diabetes mellitus with diabetic nephropathy (HCC)   Essential hypertension   Hyperlipemia   Type 2 diabetes mellitus with other specified complication (HCC)   Parkinsonism (HCC)   1-Acute hypoxic respiratory failure due to COVID-19 pneumonia -Patient presented with hypoxia oxygen saturation 80% on room air, COVID-19 positive, chest x-ray: Peripheral hazy airspace opacities in both mid lungs, concerning for multifocal pneumonia. -Continue with IV Remdesivir. Last dose today.  -Continue with Dexamethasone.  Wean off oxygen as possible.   2-Diabetes type 2: Continue with a sliding scale insulin Will add meal coverage.   Vascular dementia: Namenda and Aricept Parkinsonism.  On carbidopa levodopa  Hypertension: Resume Metoprolol.   Hyperlipidemia: Continue with Lipitor.   History of prostate and bladder cancer status post prostatectomy and radiation Sarcoidosis        Estimated body mass index is 27.76 kg/m as calculated from the following:   Height as of this encounter: 5\' 9"  (1.753 m).   Weight as of this encounter: 85.3 kg.   DVT prophylaxis: Lovenox Code Status: Code Family Communication: Care discussed with patient Disposition Plan:  Status is: Inpatient Remains inpatient appropriate because: Management of Covid    Consultants:  none  Procedures:  none Antimicrobials:    Subjective: He report breathing better, still requiring 2  L oxygen. Denies pain   Objective: Vitals:   04/18/23 0435 04/18/23 0504 04/18/23 0845 04/18/23 0847  BP: (!) 151/73 (!) 146/63    Pulse: (!) 59 64    Resp: 16     Temp: 98.9 F (37.2 C) 98 F (36.7 C)    TempSrc: Oral Oral    SpO2: 95% 93% (!) 88% 92%  Weight:      Height:        Intake/Output Summary (Last 24 hours) at 04/18/2023 1620 Last data filed at 04/18/2023 1359 Gross per 24 hour  Intake 480 ml  Output 1750 ml  Net -1270 ml   Filed Weights   04/16/23 1117 04/16/23 1544  Weight: 81.6 kg 85.3 kg    Examination:  General exam: NAD Respiratory system: BL [place].  Cardiovascular system: S 1, S 2 RRR Gastrointestinal system: BS present, soft, nt. Central nervous system: Alert Extremities: no edema  Data Reviewed: I have personally reviewed following labs and imaging studies  CBC: Recent Labs  Lab 04/16/23 1136 04/16/23 1642 04/17/23 0427  WBC 6.1 5.5 5.3  NEUTROABS  --  4.4 3.6  HGB 11.2* 11.6* 10.6*  HCT 35.1* 35.6* 33.9*  MCV 77.7* 78.8* 80.1  PLT 145* 144* 150   Basic Metabolic Panel: Recent Labs  Lab 04/16/23 1136 04/16/23 1338 04/16/23 1642 04/17/23 0427  NA 145  --   --  138  K 3.4*  --   --  3.9  CL 109  --   --  106  CO2 27  --   --  21*  GLUCOSE 180*  --   --  332*  BUN 18  --   --  19  CREATININE 1.27*  --  1.07 0.93  CALCIUM 9.0  --   --  8.5*  MG  --  1.8  --   --   PHOS  --  3.5  --   --    GFR: Estimated Creatinine Clearance: 68.5 mL/min (by C-G formula based on SCr of 0.93 mg/dL). Liver Function Tests: Recent Labs  Lab 04/17/23 0427  AST 11*  ALT 5  ALKPHOS 65  BILITOT 0.7  PROT 6.8  ALBUMIN 2.9*   No results for input(s): "LIPASE", "AMYLASE" in the last 168 hours. No results for input(s): "AMMONIA" in the last 168 hours. Coagulation Profile: Recent Labs  Lab 04/17/23 0427  INR 1.2   Cardiac Enzymes: No results for input(s): "CKTOTAL", "CKMB", "CKMBINDEX", "TROPONINI" in the last 168 hours. BNP (last 3  results) No results for input(s): "PROBNP" in the last 8760 hours. HbA1C: Recent Labs    04/16/23 1800  HGBA1C 8.3*   CBG: Recent Labs  Lab 04/17/23 0742 04/17/23 2047 04/18/23 0829 04/18/23 0934 04/18/23 1210  GLUCAP 267* 326* 198* 217* 309*   Lipid Profile: No results for input(s): "CHOL", "HDL", "LDLCALC", "TRIG", "CHOLHDL", "LDLDIRECT" in the last 72 hours. Thyroid Function Tests: No results for input(s): "TSH", "T4TOTAL", "FREET4", "T3FREE", "THYROIDAB" in the last 72 hours. Anemia Panel: No results for input(s): "VITAMINB12", "FOLATE", "FERRITIN", "TIBC", "IRON", "RETICCTPCT" in the last 72 hours. Sepsis Labs: Recent Labs  Lab 04/16/23 1338  PROCALCITON <0.10    Recent Results (from the past 240 hours)  Resp panel by RT-PCR (RSV, Flu A&B, Covid) Anterior Nasal Swab     Status: Abnormal   Collection Time: 04/16/23 11:24 AM   Specimen: Anterior Nasal Swab  Result Value Ref Range Status   SARS Coronavirus 2 by RT PCR POSITIVE (A) NEGATIVE Final    Comment: (NOTE) SARS-CoV-2 target nucleic acids are DETECTED.  The SARS-CoV-2 RNA is generally detectable in upper respiratory specimens during the acute phase of infection. Positive results are indicative of the presence of the identified virus, but do not rule out bacterial infection or co-infection with other pathogens not detected by the test. Clinical correlation with patient history and other diagnostic information is necessary to determine patient infection status. The expected result is Negative.  Fact Sheet for Patients: BloggerCourse.com  Fact Sheet for Healthcare Providers: SeriousBroker.it  This test is not yet approved or cleared by the [place] FDA and  has been authorized for detection and/or diagnosis of SARS-CoV-2 by FDA under an Emergency Use Authorization (EUA).  This EUA will remain in effect (meaning this test can be used) for the duration  of  the COVID-19 declaration under Section 564(b)(1) of the A ct, 21 U.S.C. section 360bbb-3(b)(1), unless the authorization is terminated or revoked sooner.     Influenza A by PCR NEGATIVE NEGATIVE Final   Influenza B by PCR NEGATIVE NEGATIVE Final    Comment: (NOTE) The Xpert Xpress SARS-CoV-2/FLU/RSV plus assay is intended as an aid in the diagnosis of influenza from Nasopharyngeal swab specimens and should not be used as a sole basis for treatment. Nasal washings and aspirates are unacceptable for Xpert Xpress SARS-CoV-2/FLU/RSV testing.  Fact Sheet for Patients: BloggerCourse.com  Fact Sheet for Healthcare Providers: SeriousBroker.it  This test is not yet approved or cleared by the [place] FDA and has been authorized for detection and/or diagnosis of SARS-CoV-2 by FDA under an Emergency Use Authorization (EUA). This EUA will remain in effect (meaning this test can be used) for  the duration of the COVID-19 declaration under Section 564(b)(1) of the Act, 21 U.S.C. section 360bbb-3(b)(1), unless the authorization is terminated or revoked.     Resp Syncytial Virus by PCR NEGATIVE NEGATIVE Final    Comment: (NOTE) Fact Sheet for Patients: BloggerCourse.com  Fact Sheet for Healthcare Providers: SeriousBroker.it  This test is not yet approved or cleared by the [place] FDA and has been authorized for detection and/or diagnosis of SARS-CoV-2 by FDA under an Emergency Use Authorization (EUA). This EUA will remain in effect (meaning this test can be used) for the duration of the COVID-19 declaration under Section 564(b)(1) of the Act, 21 U.S.C. section 360bbb-3(b)(1), unless the authorization is terminated or revoked.  Performed at [facility], [address] [place], [place] [address]          Radiology Studies: No results  found.       Scheduled Meds:  aspirin EC  81 mg Oral QHS   atorvastatin  20 mg Oral Daily   carbidopa-levodopa  1 tablet Oral BID WC   carbidopa-levodopa  2 tablet Oral QHS   dexamethasone  6 mg Oral Daily   donepezil  10 mg Oral QHS   dorzolamide-timolol  1 drop Right Eye BID   enoxaparin (LOVENOX) injection  40 mg Subcutaneous Q24H   insulin aspart  0-15 Units Subcutaneous TID WC   insulin aspart  0-5 Units Subcutaneous QHS   insulin aspart  3 Units Subcutaneous TID WC   insulin glargine-yfgn  10 Units Subcutaneous Daily   latanoprost  1 drop Left Eye QHS   memantine  10 mg Oral BID   metoprolol tartrate  50 mg Oral BID   pantoprazole  20 mg Oral Daily   sodium chloride flush  3 mL Intravenous Q12H   tamsulosin  0.4 mg Oral QHS   Continuous Infusions:     LOS: 2 days    Time spent: 35 Minutes    Asaf Elmquist A Juanitta Earnhardt, MD [facility]   If 7PM-7AM, please contact night-coverage [email or web address]  04/18/2023, 4:20 PM

## 2023-04-18 NOTE — Progress Notes (Signed)
Mobility Specialist - Progress Note   04/18/23 1025  Oxygen Therapy  O2 Device Nasal Cannula  O2 Flow Rate (L/min) 2 L/min  Patient Activity (if Appropriate) Ambulating  Mobility  Activity Ambulated with assistance in hallway;Ambulated with assistance to bathroom  Level of Assistance Standby assist, set-up cues, supervision of patient - no hands on  Assistive Device Front wheel walker  Distance Ambulated (ft) 100 ft  Activity Response Tolerated well  Mobility Referral Yes  Mobility visit 1 Mobility  Mobility Specialist Start Time (ACUTE ONLY) 1013  Mobility Specialist Stop Time (ACUTE ONLY) 1024  Mobility Specialist Time Calculation (min) (ACUTE ONLY) 11 min   Pt received in recliner and agreeable to mobility. No complaints during session. Pt to bathroom after session with all needs met. Instructed pt to pull call bell when finished. NT made aware.   [facility]

## 2023-04-18 NOTE — Plan of Care (Signed)
  Problem: Respiratory: Goal: Will maintain a patent airway Outcome: Progressing   Problem: Respiratory: Goal: Complications related to the disease process, condition or treatment will be avoided or minimized Outcome: Progressing   Problem: Education: Goal: Knowledge of General Education information will improve Description: Including pain rating scale, medication(s)/side effects and non-pharmacologic comfort measures Outcome: Progressing   Problem: Activity: Goal: Risk for activity intolerance will decrease Outcome: Progressing   Problem: Nutrition: Goal: Adequate nutrition will be maintained Outcome: Progressing   Problem: Pain Management: Goal: General experience of comfort will improve Outcome: Progressing   Problem: Safety: Goal: Ability to remain free from injury will improve Outcome: Progressing

## 2023-04-18 NOTE — Plan of Care (Signed)
  Problem: Respiratory: Goal: Complications related to the disease process, condition or treatment will be avoided or minimized Outcome: Progressing   Problem: Education: Goal: Knowledge of General Education information will improve Description: Including pain rating scale, medication(s)/side effects and non-pharmacologic comfort measures Outcome: Progressing   Problem: Clinical Measurements: Goal: Ability to maintain clinical measurements within normal limits will improve Outcome: Progressing

## 2023-04-18 NOTE — Progress Notes (Signed)
SATURATION QUALIFICATIONS: (This note is used to comply with regulatory documentation for home oxygen)  Patient Saturations on Room Air at Rest = 88%  Patient Saturations on Room Air while Ambulating = 82%  Patient Saturations on 2 Liters of oxygen while Ambulating = 92%

## 2023-04-19 DIAGNOSIS — J9600 Acute respiratory failure, unspecified whether with hypoxia or hypercapnia: Secondary | ICD-10-CM | POA: Diagnosis not present

## 2023-04-19 DIAGNOSIS — U071 COVID-19: Secondary | ICD-10-CM | POA: Diagnosis not present

## 2023-04-19 LAB — BASIC METABOLIC PANEL
Anion gap: 6 (ref 5–15)
BUN: 23 mg/dL (ref 8–23)
CO2: 28 mmol/L (ref 22–32)
Calcium: 8.8 mg/dL — ABNORMAL LOW (ref 8.9–10.3)
Chloride: 103 mmol/L (ref 98–111)
Creatinine, Ser: 1.15 mg/dL (ref 0.61–1.24)
GFR, Estimated: >60 mL/min (ref >60)
Glucose, Bld: 147 mg/dL — ABNORMAL HIGH (ref 70–99)
Potassium: 3.3 mmol/L — ABNORMAL LOW (ref 3.5–5.1)
Sodium: 137 mmol/L (ref 135–145)

## 2023-04-19 LAB — GLUCOSE, CAPILLARY
Glucose-Capillary: 154 mg/dL — ABNORMAL HIGH (ref 70–99)
Glucose-Capillary: 301 mg/dL — ABNORMAL HIGH (ref 70–99)

## 2023-04-19 MED ORDER — INSULIN GLARGINE-YFGN 100 UNIT/ML ~~LOC~~ SOLN
15.0000 [IU] | Freq: Every day | SUBCUTANEOUS | Status: DC
  Administered 2023-04-19: 15 [IU] via SUBCUTANEOUS
  Filled 2023-04-19: qty 0.15

## 2023-04-19 MED ORDER — ALBUTEROL SULFATE HFA 108 (90 BASE) MCG/ACT IN AERS: 2.0000 | INHALATION_SPRAY | Freq: Four times a day (QID) | RESPIRATORY_TRACT | 2 refills | Status: AC | PRN

## 2023-04-19 MED ORDER — DEXAMETHASONE 6 MG PO TABS: 6.0000 mg | ORAL_TABLET | Freq: Every day | ORAL | 0 refills | Status: AC

## 2023-04-19 MED ORDER — POTASSIUM CHLORIDE CRYS ER 20 MEQ PO TBCR
40.0000 meq | EXTENDED_RELEASE_TABLET | Freq: Once | ORAL | Status: AC
  Administered 2023-04-19: 40 meq via ORAL
  Filled 2023-04-19: qty 2

## 2023-04-19 NOTE — Plan of Care (Signed)
°  Problem: Respiratory: Goal: Will maintain a patent airway Outcome: Progressing   Problem: Education: Goal: Knowledge of General Education information will improve Description: Including pain rating scale, medication(s)/side effects and non-pharmacologic comfort measures Outcome: Progressing   Problem: Pain Management: Goal: General experience of comfort will improve Outcome: Progressing   Problem: Safety: Goal: Ability to remain free from injury will improve Outcome: Progressing

## 2023-04-19 NOTE — TOC Initial Note (Signed)
Transition of Care [facility]) - Initial/Assessment Note    Patient Details  Name: [name] MRN: [id number] Date of Birth: Nov 23, 1940  Transition of Care [facility]) CM/SW Contact:    Adrian Prows, RN Phone Number: 04/19/2023, 12:05 PM  Clinical Narrative:                 TOC for d/c planning; home oxygen ordered; spoke w/ pt's wife Damiel Worthey [phone]); she says pt lives at home w/ her; she plans for him to return at d/c; Mrs Willinger verified pt has PCP/insurance; she will transport pt home; she denies pt experiencing SDOH risks; she says pt has walker, Rolator, BSC,  shower chair, and cpap; pt does not have HH services or home oxygen; she agrees to pt receiving home oxygen; she does not have an agency preference; spoke w/ Marthann Schiller at [employer]; he says agency can provide service; travel tank will be delivered to pt's room before d/c; pt's wife notified; agency's contact info placed in follow up provider section of d/c instructions; no TOC needs.  Expected Discharge Plan: Home/Self Care Barriers to Discharge: No Barriers Identified   Patient Goals and CMS Choice Patient states their goals for this hospitalization and ongoing recovery are:: per pt's wife Angelgabriel Krane, patient will return home CMS Medicare.gov Compare Post Acute Care list provided to:: Patient Represenative (must comment) Rito Ehrlich, wife) Choice offered to / list presented to : Spouse Allen ownership interest in [facility].provided to:: Spouse    Expected Discharge Plan and Services   Discharge Planning Services: CM Consult Post Acute Care Choice: Durable Medical Equipment (home oxygen) Living arrangements for the past 2 months: Single Family Home Expected Discharge Date: 04/19/23               DME Arranged: Oxygen DME Agency: [employer] Date DME Agency Contacted: 04/19/23 Time DME Agency Contacted: 1158 Representative spoke with at DME Agency: Mitch HH Arranged: NA HH Agency: NA         Prior Living Arrangements/Services Living arrangements for the past 2 months: Single Family Home Lives with:: Spouse Patient language and need for interpreter reviewed:: Yes Do you feel safe going back to the place where you live?: Yes      Need for Family Participation in Patient Care: Yes (Comment) Care giver support system in place?: Yes (comment) Current home services: DME (walker, Rolator, BSC, cpap) Criminal Activity/Legal Involvement Pertinent to Current Situation/Hospitalization: No - Comment as needed  Activities of Daily Living   ADL Screening (condition at time of admission) Independently performs ADLs?: Yes (appropriate for developmental age) Is the patient deaf or have difficulty hearing?: No Does the patient have difficulty seeing, even when wearing glasses/contacts?: No Does the patient have difficulty concentrating, remembering, or making decisions?: No  Permission Sought/Granted Permission sought to share information with : Case Manager Permission granted to share information with : Yes, Verbal Permission Granted  Share Information with NAME: Case Manager     Permission granted to share info w Relationship: Jamire Macnab (wife) [phone]     Emotional Assessment Appearance:: Other (Comment Required (unable to assess) Attitude/Demeanor/Rapport: Unable to Assess Affect (typically observed): Unable to Assess Orientation: :  (unable to assess) Alcohol / Substance Use: Not Applicable Psych Involvement: No (comment)  Admission diagnosis:  Acute respiratory failure due to COVID-19 (HCC) [U07.1, J96.00] COVID [U07.1] Patient Active Problem List   Diagnosis Date Noted   Acute respiratory failure due to COVID-19 (HCC) 04/16/2023   Parkinsonism (HCC) 09/17/2022  Abnormal CT of the chest 08/13/2022   COPD (chronic obstructive pulmonary disease) (HCC) 08/13/2022   Acute respiratory failure with hypoxia (HCC) 07/23/2022   Abdominal pain 07/23/2022   Mesenteric  ischemia (HCC) 07/23/2022   AKI (acute kidney injury) (HCC) 07/23/2022   Lymphadenopathy 07/23/2022   Microcytic hypochromic anemia 07/23/2022   History of gout 07/23/2022   Chronic coughing 10/11/2021   Severe hypoxemia 10/11/2021   Severe obstructive sleep apnea-hypopnea syndrome 10/11/2021   Moderate dementia (HCC) 06/26/2021   Hypersomnia, organic 06/26/2021   Excessive daytime sleepiness 06/26/2021   Snoring 06/26/2021   Fever in adult 10/26/2017   Bladder cancer (HCC) 10/26/2017   Mediastinal lymphadenopathy 10/26/2017   Polyarthralgia 10/26/2017   Effusion of left knee joint 10/26/2017   Angioedema 02/01/2014   Essential hypertension 02/01/2014   Type 2 diabetes mellitus with diabetic nephropathy (HCC) 02/01/2014   Hyperlipemia 02/01/2014   Pure hypercholesterolemia 12/08/2012   Type 2 diabetes mellitus with other specified complication (HCC) 12/06/2012   Primary hypertension 12/06/2012   Prostate cancer (HCC) 04/30/2011   PCP:  Laurann Montana, MD Pharmacy:   [facility] - [place], [place] - [address] [place] [address] Phone: [phone] Fax: [phone]  [facility] [place]), [place] - [address] [place] [address] Phone: [phone] Fax: [phone]  [facility] [address] [place] [address] Phone: [phone] Fax: [phone]     Social Drivers of Health (SDOH) Social History: SDOH Screenings   Food Insecurity: No Food Insecurity (04/19/2023)  Housing: Low Risk  (04/19/2023)  Transportation Needs: No Transportation Needs (04/19/2023)  Utilities: Not At Risk (04/19/2023)  Depression (PHQ2-9): Low Risk  (04/26/2021)  Tobacco Use: Medium Risk (04/16/2023)   SDOH Interventions: Food Insecurity Interventions: Intervention Not Indicated, Inpatient TOC Housing Interventions: Intervention Not  Indicated, Inpatient TOC Transportation Interventions: Intervention Not Indicated, Inpatient TOC Utilities Interventions: Intervention Not Indicated, Inpatient TOC   Readmission Risk Interventions    04/19/2023   11:57 AM  Readmission Risk Prevention Plan  Transportation Screening Complete  PCP or Specialist Appt within 3-5 Days Complete  HRI or Home Care Consult Complete  Social Work Consult for Recovery Care Planning/Counseling Complete  Palliative Care Screening Not Applicable  Medication Review Oceanographer) Complete

## 2023-04-19 NOTE — Progress Notes (Signed)
OT Cancellation Note  Patient Details Name: Anker Kravec MRN: [id number] DOB: 10-Nov-1940   Cancelled Treatment:    Reason Eval/Treat Not Completed: OT screened, no needs identified, will sign off  Limmie Patricia, OTR/L,CBIS  Supplemental OT - [facility] and [facility] Secure Chat Preferred   04/19/2023, 12:40 PM

## 2023-04-19 NOTE — Discharge Summary (Signed)
Physician Discharge Summary   Patient: [name] MRN: [id number] DOB: November 18, 1940  Admit date:     04/16/2023  Discharge date: 04/19/23  Discharge Physician: Alba Cory   PCP: Laurann Montana, MD   Recommendations at discharge:    Follow up with PCP for resolution of Covid.  Follow up to determine if he will need to continue to use Oxygen.  Follow up on DM regimen.   Discharge Diagnoses: Principal Problem:   Acute respiratory failure due to COVID-19 [facility]) Active Problems:   Type 2 diabetes mellitus with diabetic nephropathy (HCC)   Essential hypertension   Hyperlipemia   Type 2 diabetes mellitus with other specified complication (HCC)   Parkinsonism (HCC)  Resolved Problems:   * No resolved hospital problems. *  Hospital Course: 81 year old with past medical history significant for COPD, documented dementia who developed cough, increased shortness of breath for 5 days prior to admission.  Evaluation in the ED he was noted to be hypoxic oxygen saturation 81 on room air.  He was found to have COVID-19 pneumonia.    Assessment and Plan: 1-Acute hypoxic respiratory failure due to COVID-19 pneumonia -Patient presented with hypoxia oxygen saturation 80% on room air, COVID-19 positive, chest x-ray: Peripheral hazy airspace opacities in both mid lungs, concerning for multifocal pneumonia. -Completed IV Remdesivir.  -Continue with Dexamethasone. Has one more day at discharge. He will complete 4 days.  Wean off oxygen as possible.    2-Diabetes type 2: Continue with a sliding scale insulin Added meal coverage.  Resume Home regimen at discharge.   Vascular dementia: Namenda and Aricept Parkinsonism.  On carbidopa levodopa   Hypertension: Resume Metoprolol. Norvasc at discharge.    Hyperlipidemia: Continue with Lipitor.    History of prostate and bladder cancer status post prostatectomy and radiation Sarcoidosis                Consultants: None Procedures  performed: None Disposition: Home Diet recommendation:  Discharge Diet Orders (From admission, onward)     Start     Ordered   04/19/23 0000  Diet - low sodium heart healthy        04/19/23 1042           Carb modified diet DISCHARGE MEDICATION: Allergies as of 04/19/2023       Reactions   Ace Inhibitors Swelling   Benazepril   Allopurinol Rash        Medication List     TAKE these medications    Accu-Chek Aviva Plus test strip Generic drug: glucose blood USE AS INSTRUCTED TO CHECK BLOOD SUGAR THREE TIMES DAILY   Accu-Chek Aviva Plus w/Device Kit Use to check blood sugar 3 times per day dx code E11.65   acetaminophen 325 MG tablet Commonly known as: TYLENOL Take 650 mg by mouth every 6 (six) hours as needed for moderate pain.   albuterol 108 (90 Base) MCG/ACT inhaler Commonly known as: VENTOLIN HFA Inhale 2 puffs into the lungs every 6 (six) hours as needed for wheezing or shortness of breath.   amLODipine 10 MG tablet Commonly known as: NORVASC Take 1 tablet (10 mg total) by mouth daily. What changed: when to take this   aspirin 81 MG tablet Take 81 mg by mouth at bedtime.   atorvastatin 20 MG tablet Commonly known as: LIPITOR Take 20 mg by mouth every evening.   carbidopa-levodopa 25-100 MG tablet Commonly known as: SINEMET IR Take 1 pill in the morning, 1 pill in the afternoon, and  2 pills at night   Centrum Silver Adult 50+ Tabs Take 1 tablet by mouth in the morning.   PreserVision AREDS Tabs Take 1 capsule by mouth in the morning.   dexamethasone 6 MG tablet Commonly known as: DECADRON Take 1 tablet (6 mg total) by mouth daily for 1 day. Start taking on: April 20, 2023   donepezil 10 MG tablet Commonly known as: ARICEPT Take 1 tablet (10 mg total) by mouth at bedtime.   dorzolamide-timolol 2-0.5 % ophthalmic solution Commonly known as: COSOPT Place 1 drop into the right eye 2 (two) times daily.   escitalopram 10 MG  tablet Commonly known as: Lexapro Take 1 tablet (10 mg total) by mouth daily. What changed: when to take this   ferrous sulfate 325 (65 FE) MG tablet Take 1 tablet (325 mg total) by mouth 2 (two) times daily. What changed: when to take this   fish oil-omega-3 fatty acids 1000 MG capsule Take 1 g by mouth in the morning.   guaifenesin 100 MG/5ML syrup Commonly known as: ROBITUSSIN Take 200 mg by mouth 3 (three) times daily as needed for cough.   INSULIN SYRINGE .5CC/31GX5/16" 31G X 5/16" 0.5 ML Misc Use one to inject insulin daily   latanoprost 0.005 % ophthalmic solution Commonly known as: XALATAN Place 1 drop into the left eye at bedtime.   loperamide 2 MG tablet Commonly known as: IMODIUM A-D Take 2 mg by mouth 4 (four) times daily as needed for diarrhea or loose stools.   meclizine 25 MG tablet Commonly known as: ANTIVERT Take 1 tablet (25 mg total) by mouth 3 (three) times daily as needed for dizziness.   memantine 10 MG tablet Commonly known as: NAMENDA Take 1 tablet (10 mg total) by mouth 2 (two) times daily.   metoprolol tartrate 50 MG tablet Commonly known as: LOPRESSOR Take 50 mg by mouth 2 (two) times daily.   NovoLIN 70/30 (70-30) 100 UNIT/ML injection Generic drug: insulin NPH-regular Human Inject 5-12 Units into the skin 3 (three) times daily as needed (high blood sugar).   tamsulosin 0.4 MG Caps capsule Commonly known as: FLOMAX Take 1 capsule (0.4 mg total) by mouth at bedtime.               Durable Medical Equipment  (From admission, onward)           Start     Ordered   04/18/23 1620  For home use only DME oxygen  Once       Question Answer Comment  Length of Need 6 Months   Mode or (Route) Nasal cannula   Liters per Minute 2   Frequency Continuous (stationary and portable oxygen unit needed)   Oxygen delivery system Gas      04/18/23 1619            Discharge Exam: Filed Weights   04/16/23 1117 04/16/23 1544  Weight:  81.6 kg 85.3 kg   General; NAD  Condition at discharge: stable  The results of significant diagnostics from this hospitalization (including imaging, microbiology, ancillary and laboratory) are listed below for reference.   Imaging Studies: DG Chest 2 View Result Date: 04/16/2023 CLINICAL DATA:  Chest pain, shortness of breath, and fever for the past 2 days. EXAM: CHEST - 2 VIEW COMPARISON:  Chest x-ray dated July 25, 2022. FINDINGS: Normal heart size. Peripheral hazy airspace opacities in both mid lungs. No pleural effusion or pneumothorax. No acute osseous abnormality. IMPRESSION: 1. Peripheral hazy airspace opacities  in both mid lungs, concerning for multifocal pneumonia. Electronically Signed   By: Obie Dredge M.D.   On: 04/16/2023 12:09    Microbiology: Results for orders placed or performed during the hospital encounter of 04/16/23  Resp panel by RT-PCR (RSV, Flu A&B, Covid) Anterior Nasal Swab     Status: Abnormal   Collection Time: 04/16/23 11:24 AM   Specimen: Anterior Nasal Swab  Result Value Ref Range Status   SARS Coronavirus 2 by RT PCR POSITIVE (A) NEGATIVE Final    Comment: (NOTE) SARS-CoV-2 target nucleic acids are DETECTED.  The SARS-CoV-2 RNA is generally detectable in upper respiratory specimens during the acute phase of infection. Positive results are indicative of the presence of the identified virus, but do not rule out bacterial infection or co-infection with other pathogens not detected by the test. Clinical correlation with patient history and other diagnostic information is necessary to determine patient infection status. The expected result is Negative.  Fact Sheet for Patients: BloggerCourse.com  Fact Sheet for Healthcare Providers: SeriousBroker.it  This test is not yet approved or cleared by the [place] FDA and  has been authorized for detection and/or diagnosis of SARS-CoV-2 by FDA under  an Emergency Use Authorization (EUA).  This EUA will remain in effect (meaning this test can be used) for the duration of  the COVID-19 declaration under Section 564(b)(1) of the A ct, 21 U.S.C. section 360bbb-3(b)(1), unless the authorization is terminated or revoked sooner.     Influenza A by PCR NEGATIVE NEGATIVE Final   Influenza B by PCR NEGATIVE NEGATIVE Final    Comment: (NOTE) The Xpert Xpress SARS-CoV-2/FLU/RSV plus assay is intended as an aid in the diagnosis of influenza from Nasopharyngeal swab specimens and should not be used as a sole basis for treatment. Nasal washings and aspirates are unacceptable for Xpert Xpress SARS-CoV-2/FLU/RSV testing.  Fact Sheet for Patients: BloggerCourse.com  Fact Sheet for Healthcare Providers: SeriousBroker.it  This test is not yet approved or cleared by the [place] FDA and has been authorized for detection and/or diagnosis of SARS-CoV-2 by FDA under an Emergency Use Authorization (EUA). This EUA will remain in effect (meaning this test can be used) for the duration of the COVID-19 declaration under Section 564(b)(1) of the Act, 21 U.S.C. section 360bbb-3(b)(1), unless the authorization is terminated or revoked.     Resp Syncytial Virus by PCR NEGATIVE NEGATIVE Final    Comment: (NOTE) Fact Sheet for Patients: BloggerCourse.com  Fact Sheet for Healthcare Providers: SeriousBroker.it  This test is not yet approved or cleared by the [place] FDA and has been authorized for detection and/or diagnosis of SARS-CoV-2 by FDA under an Emergency Use Authorization (EUA). This EUA will remain in effect (meaning this test can be used) for the duration of the COVID-19 declaration under Section 564(b)(1) of the Act, 21 U.S.C. section 360bbb-3(b)(1), unless the authorization is terminated or revoked.  Performed at [facility], [address], [place], [place] [address]     Labs: CBC: Recent Labs  Lab 04/16/23 1136 04/16/23 1642 04/17/23 0427  WBC 6.1 5.5 5.3  NEUTROABS  --  4.4 3.6  HGB 11.2* 11.6* 10.6*  HCT 35.1* 35.6* 33.9*  MCV 77.7* 78.8* 80.1  PLT 145* 144* 150   Basic Metabolic Panel: Recent Labs  Lab 04/16/23 1136 04/16/23 1338 04/16/23 1642 04/17/23 0427 04/19/23 0823  NA 145  --   --  138 137  K 3.4*  --   --  3.9 3.3*  CL 109  --   --  106 103  CO2 27  --   --  21* 28  GLUCOSE 180*  --   --  332* 147*  BUN 18  --   --  19 23  CREATININE 1.27*  --  1.07 0.93 1.15  CALCIUM 9.0  --   --  8.5* 8.8*  MG  --  1.8  --   --   --   PHOS  --  3.5  --   --   --    Liver Function Tests: Recent Labs  Lab 04/17/23 0427  AST 11*  ALT 5  ALKPHOS 65  BILITOT 0.7  PROT 6.8  ALBUMIN 2.9*   CBG: Recent Labs  Lab 04/18/23 0934 04/18/23 1210 04/18/23 1704 04/18/23 2026 04/19/23 0743  GLUCAP 217* 309* 384* 345* 154*    Discharge time spent: greater than 30 minutes.  Signed: Alba Cory, MD [facility] 04/19/2023

## 2023-04-19 NOTE — Evaluation (Signed)
Physical Therapy Evaluation-1x Patient Details Name: [name] MRN: [id number] DOB: 1941/03/10 Today's Date: 04/19/2023     Patient Saturations on Room Air while Ambulating = 84%  Patient Saturations on 2 Liters of oxygen while Ambulating = 91%      History of Present Illness  82 yo male admitted with COVID. Hx of COPD, dementia, DM, CVA, bladder Ca, Parkinson's disease, CHF, CKD  Clinical Impression  On eval, pt was Mod Ind with mobility. He walked ~200 feet with a RW.  Pt tolerated activity well. He reports he uses a walker at baseline. Do not anticipate any f/u PT needs at this time. Recommend daily ambulation with nursing and/or mobility team. 1x eval. Will sign off.       If plan is discharge home, recommend the following: Assistance with cooking/housework;Assist for transportation;Help with stairs or ramp for entrance   Can travel by private vehicle        Equipment Recommendations None recommended by PT  Recommendations for Other Services       Functional Status Assessment Patient has had a recent decline in their functional status and demonstrates the ability to make significant improvements in function in a reasonable and predictable amount of time.     Precautions / Restrictions Precautions Precautions: Fall Precaution Comments: monitor O2 Restrictions Weight Bearing Restrictions Per Provider Order: No      Mobility  Bed Mobility               General bed mobility comments: oob in bathroom    Transfers Overall transfer level: Modified independent Equipment used: Rolling walker (2 wheels)                    Ambulation/Gait Ambulation/Gait assistance: Modified independent (Device/Increase time) Gait Distance (Feet): 200 Feet Assistive device: Rolling walker (2 wheels) Gait Pattern/deviations: Step-through pattern       General Gait Details: No LOB with RW use. Pt denied dizziness. O2 91% on 2L.  Stairs             Wheelchair Mobility     Tilt Bed    Modified Rankin (Stroke Patients Only)       Balance Overall balance assessment: Needs assistance         Standing balance support: Bilateral upper extremity supported, During functional activity Standing balance-Leahy Scale: Fair                               Pertinent Vitals/Pain Pain Assessment Pain Assessment: No/denies pain    Home Living Family/patient expects to be discharged to:: Private residence Living Arrangements: Spouse/significant other Available Help at Discharge: Available 24 hours/day;Family Type of Home: House Home Access: Stairs to enter Entrance Stairs-Rails: None Entrance Stairs-Number of Steps: 1   Home Layout: One level Home Equipment: Agricultural consultant (2 wheels);Shower seat;Other (comment)    Prior Function Prior Level of Function : Independent/Modified Independent                   Extremity/Trunk Assessment   Upper Extremity Assessment Upper Extremity Assessment: Defer to OT evaluation    Lower Extremity Assessment Lower Extremity Assessment: Generalized weakness    Cervical / Trunk Assessment Cervical / Trunk Assessment: Normal  Communication   Communication Communication: Hearing impairment  Cognition Arousal: Alert Behavior During Therapy: WFL for tasks assessed/performed Overall Cognitive Status: History of cognitive impairments - at baseline  General Comments      Exercises     Assessment/Plan    PT Assessment Patient does not need any further PT services (can mobilize with nursing/mobility team)  PT Problem List         PT Treatment Interventions      PT Goals (Current goals can be found in the Care Plan section)  Acute Rehab PT Goals Patient Stated Goal: home PT Goal Formulation: With patient Time For Goal Achievement: 05/03/23 Potential to Achieve Goals: Good    Frequency        Co-evaluation               AM-PAC PT "6 Clicks" Mobility  Outcome Measure Help needed turning from your back to your side while in a flat bed without using bedrails?: None Help needed moving from lying on your back to sitting on the side of a flat bed without using bedrails?: None Help needed moving to and from a bed to a chair (including a wheelchair)?: None Help needed standing up from a chair using your arms (e.g., wheelchair or bedside chair)?: None Help needed to walk in hospital room?: None Help needed climbing 3-5 steps with a railing? : A Little 6 Click Score: 23    End of Session Equipment Utilized During Treatment: Gait belt;Oxygen Activity Tolerance: Patient tolerated treatment well Patient left: in chair;with call bell/phone within reach        Time: 1006-1026 PT Time Calculation (min) (ACUTE ONLY): 20 min   Charges:   PT Evaluation $PT Eval Low Complexity: 1 Low   PT General Charges $$ ACUTE PT VISIT: 1 Visit            Faye Ramsay, PT Acute Rehabilitation  Office: [phone]

## 2023-04-20 ENCOUNTER — Ambulatory Visit: Payer: Medicare HMO | Admitting: Psychiatry

## 2023-04-20 LAB — GLUCOSE, CAPILLARY
Glucose-Capillary: 237 mg/dL — ABNORMAL HIGH (ref 70–99)
Glucose-Capillary: 275 mg/dL — ABNORMAL HIGH (ref 70–99)

## 2023-04-29 DIAGNOSIS — E785 Hyperlipidemia, unspecified: Secondary | ICD-10-CM | POA: Diagnosis not present

## 2023-04-29 DIAGNOSIS — N1831 Chronic kidney disease, stage 3a: Secondary | ICD-10-CM | POA: Diagnosis not present

## 2023-04-29 DIAGNOSIS — E1169 Type 2 diabetes mellitus with other specified complication: Secondary | ICD-10-CM | POA: Diagnosis not present

## 2023-04-29 DIAGNOSIS — J9601 Acute respiratory failure with hypoxia: Secondary | ICD-10-CM | POA: Diagnosis not present

## 2023-04-29 DIAGNOSIS — D508 Other iron deficiency anemias: Secondary | ICD-10-CM | POA: Diagnosis not present

## 2023-04-29 DIAGNOSIS — E11319 Type 2 diabetes mellitus with unspecified diabetic retinopathy without macular edema: Secondary | ICD-10-CM | POA: Diagnosis not present

## 2023-04-29 DIAGNOSIS — J439 Emphysema, unspecified: Secondary | ICD-10-CM | POA: Diagnosis not present

## 2023-04-29 DIAGNOSIS — Z8616 Personal history of COVID-19: Secondary | ICD-10-CM | POA: Diagnosis not present

## 2023-04-29 DIAGNOSIS — I700 Atherosclerosis of aorta: Secondary | ICD-10-CM | POA: Diagnosis not present

## 2023-04-29 DIAGNOSIS — I129 Hypertensive chronic kidney disease with stage 1 through stage 4 chronic kidney disease, or unspecified chronic kidney disease: Secondary | ICD-10-CM | POA: Diagnosis not present

## 2023-05-04 ENCOUNTER — Ambulatory Visit: Payer: Medicare HMO | Admitting: Podiatry

## 2023-05-06 ENCOUNTER — Telehealth: Payer: Self-pay | Admitting: Emergency Medicine

## 2023-05-12 ENCOUNTER — Telehealth: Payer: Self-pay | Admitting: Neurology

## 2023-05-12 NOTE — Telephone Encounter (Signed)
Pt's wife called to verify appoinment.

## 2023-05-18 NOTE — Progress Notes (Unsigned)
Patient: [name] Date of Birth: 1941-03-10  Reason for Visit: Follow up History from: Patient, wife Primary Neurologist: Chima/Dohmeier   ASSESSMENT AND PLAN 83 y.o. year old male   1.  Dementia 2.  Parkinsonism with tremor, gait difficulty 3.  OSA 4.  Depression  -MMSE 18/30 (15/30), overall stable, I don't see much change -Continue Aricept 10 mg daily, Namenda 10 mg twice daily, Lexapro 10 mg daily for mood (reviewed with daughter Annabelle Harman, takes meds as above, sometimes doesn't take on time, because spends time in the bed) -Continue Sinemet 25/100 mg 1 tablet in the morning, 1 tablet at noon, 2 in the evening, I do not see any tremor, seems to be moving fairly well, looks like Dr. Delena Bali increased to 4 tablets daily after more tremor on 3 -We discussed the importance of using CPAP nightly for minimum of 4 hours.  We have requested 2 L oxygen bled into CPAP.  He needs to follow-up with primary care about using oxygen post COVID hospitalization. I will send new orders to DME to ensure on hand.  -Recommended he increase physical activity, brain stimulating activity -Follow-up in 6 months or sooner if needed  HISTORY OF PRESENT ILLNESS: Today 05/19/23 MMSE 18/30 today. In the hospital Dec for COVID PNA. Memory is about the same,  he  thinks doing great for his age. Does his own ADLs. Uses walker, no falls. Tremor is hands hasn't been bothersome. Good appetite. Daughter manages medications, they aren't sure but think medication list is accurate, I called her and left a message to verify.  Per medication list on Sinemet 25/100 mg 1 tablet twice daily, 2 tablets at bedtime, Aricept 10 mg daily, Namenda 10 mg twice daily.  Doesn't like his CPAP, when he wears it, he doesn't snore, wife knows he sleeps better. Is sedentary. He doesn't drive. Supposed to be doing his own physical therapy. He does what he wants to do. When he came home from hospital was on oxygen, he took himself off it.  Had  issues with CPAP, reports took to DME and they reset it.  90-day download 02/18/23-05/18/23 shows 66% use, > 4 hours 33%, average usage days used 4 hours 4 minutes. 6-12 cm.  AHI 3.1, leak 33.  Update 09/17/22 SS: At last visit in November 2023 with Dr. Delena Bali, Sinemet was increased 25/100 mg to 1 tablet AM and Noon, 2 pills PM.  Continue Aricept 10 mg, Namenda 10 mg twice daily, Lexapro 10 mg daily. He is not using his CPAP, he doesn't want to use it. He wife is witnessing apneas, he wakes himself up coughing. Tremor is better. Daughter manages medications. He isn't active, watches TV and sleeps. Uses walker, no falls. He doesn't want to get up and move around much, He was hospitalized April 2024 for hypoxia. MMSE 15/30 today. Apparently briefly started on oxygen but told not needed, so returned it. PT ordered but hasn't been started. Doesn't feel unsteady. He feels like mood is good. His wife is frustrated.   HISTORY  03/20/22 Dr. Delena Bali Brief HPI: 83 year old male with a history of OSA, HTN, HLD, diabetes who follows in clinic for memory loss.    At his last visit, Lexapro was increased to 10 mg daily. He was started on Sinemet trial for tremor and bradykinesia.   Interval History: PSG revealed OSA and he was started on CPAP. This has improved his daytime somnolence. Feels his memory has improved sightly since starting CPAP. Otherwise feels  stable with no new concerns today. He is tolerating Namenda 10 mg BID and donepezil 10 mg daily without issues.   Feels his gait and balance have improved slightly. Tremor has improved though he will still notice it occasionally. Tolerates Sinemet well without issues.   Mood is stable on Lexapro 10 mg daily  REVIEW OF SYSTEMS: Out of a complete 14 system review of symptoms, the patient complains only of the following symptoms, and all other reviewed systems are negative.  See HPI  ALLERGIES: Allergies  Allergen Reactions   Ace Inhibitors Swelling     Benazepril   Allopurinol Rash    HOME MEDICATIONS: Outpatient Medications Prior to Visit  Medication Sig Dispense Refill   ACCU-CHEK AVIVA PLUS test strip USE AS INSTRUCTED TO CHECK BLOOD SUGAR THREE TIMES DAILY 300 each 1   acetaminophen (TYLENOL) 325 MG tablet Take 650 mg by mouth every 6 (six) hours as needed for moderate pain.     albuterol (VENTOLIN HFA) 108 (90 Base) MCG/ACT inhaler Inhale 2 puffs into the lungs every 6 (six) hours as needed for wheezing or shortness of breath. 17 each 2   amLODipine (NORVASC) 10 MG tablet Take 1 tablet (10 mg total) by mouth daily. (Patient taking differently: Take 10 mg by mouth in the morning.) 30 tablet 1   aspirin 81 MG tablet Take 81 mg by mouth at bedtime.      atorvastatin (LIPITOR) 20 MG tablet Take 20 mg by mouth every evening.     Blood Glucose Monitoring Suppl (ACCU-CHEK AVIVA PLUS) w/Device KIT Use to check blood sugar 3 times per day dx code E11.65 1 kit 0   carbidopa-levodopa (SINEMET IR) 25-100 MG tablet Take 1 pill in the morning, 1 pill in the afternoon, and 2 pills at night 360 tablet 3   donepezil (ARICEPT) 10 MG tablet Take 1 tablet (10 mg total) by mouth at bedtime. 90 tablet 3   dorzolamide-timolol (COSOPT) 22.3-6.8 MG/ML ophthalmic solution Place 1 drop into the right eye 2 (two) times daily.     escitalopram (LEXAPRO) 10 MG tablet Take 1 tablet (10 mg total) by mouth daily. (Patient taking differently: Take 10 mg by mouth at bedtime.) 90 tablet 3   ferrous sulfate 325 (65 FE) MG tablet Take 1 tablet (325 mg total) by mouth 2 (two) times daily. (Patient taking differently: Take 325 mg by mouth every evening.) 60 tablet 3   fish oil-omega-3 fatty acids 1000 MG capsule Take 1 g by mouth in the morning.     guaifenesin (ROBITUSSIN) 100 MG/5ML syrup Take 200 mg by mouth 3 (three) times daily as needed for cough.     Insulin Syringe-Needle U-100 (INSULIN SYRINGE .5CC/31GX5/16") 31G X 5/16" 0.5 ML MISC Use one to inject insulin daily 30  each 1   latanoprost (XALATAN) 0.005 % ophthalmic solution Place 1 drop into the left eye at bedtime.     loperamide (IMODIUM A-D) 2 MG tablet Take 2 mg by mouth 4 (four) times daily as needed for diarrhea or loose stools.     meclizine (ANTIVERT) 25 MG tablet Take 1 tablet (25 mg total) by mouth 3 (three) times daily as needed for dizziness. 30 tablet 0   memantine (NAMENDA) 10 MG tablet Take 1 tablet (10 mg total) by mouth 2 (two) times daily. 180 tablet 3   metoprolol tartrate (LOPRESSOR) 50 MG tablet Take 50 mg by mouth 2 (two) times daily.     Multiple Vitamins-Minerals (CENTRUM SILVER ADULT 50+) TABS  Take 1 tablet by mouth in the morning.     Multiple Vitamins-Minerals (PRESERVISION AREDS) TABS Take 1 capsule by mouth in the morning.     NOVOLIN 70/30 (70-30) 100 UNIT/ML injection Inject 5-12 Units into the skin 3 (three) times daily as needed (high blood sugar).     predniSONE (DELTASONE) 10 MG tablet Take by mouth.     tamsulosin (FLOMAX) 0.4 MG CAPS capsule Take 1 capsule (0.4 mg total) by mouth at bedtime. 30 capsule 1   tiZANidine (ZANAFLEX) 2 MG tablet Take 2 mg by mouth at bedtime as needed.     No facility-administered medications prior to visit.    PAST MEDICAL HISTORY: Past Medical History:  Diagnosis Date   Arthritis    Bladder cancer (HCC)    in situ   Cataracts, bilateral    CHF (congestive heart failure) (HCC)    Chronic kidney disease    COPD (chronic obstructive pulmonary disease) (HCC)    Dementia (HCC)    Diabetes mellitus    insulin dependent   Glaucoma    Gout    Hearing loss    Heart disease, unspecified    Hypercholesterolemia    Hypertension    Memory changes    Neuromuscular disorder (HCC)    parkinson's   Prostate cancer (HCC)    s/p radiation   Sarcoidosis    Stroke (HCC)    Unspecified glaucoma(365.9)     PAST SURGICAL HISTORY: Past Surgical History:  Procedure Laterality Date   BLADDER SURGERY     for carcinoma   CATARACT EXTRACTION  Bilateral 2011   CIRCUMCISION N/A 10/29/2022   Procedure: CIRCUMCISION ADULT;  Surgeon: Crist Fat, MD;  Location: [facility];  Service: Urology;  Laterality: N/A;  60 MINUTES   EYE SURGERY Bilateral    pressure surgery related to glaucoma   knee replacement surgery  1999   right   PROSTATECTOMY  2013    FAMILY HISTORY: Family History  Problem Relation Age of Onset   Hypertension Mother    Diabetes Mellitus I Mother    Hypertension Father    Coronary artery disease Father    Hypertension Sister    Hypertension Brother    Coronary artery disease Brother    Diabetes Mellitus I Brother    Diabetes Mellitus I Brother    Cancer Neg Hx     SOCIAL HISTORY: Social History   Socioeconomic History   Marital status: Married    Spouse name: Erskine Squibb   Number of children: 3   Years of education: 9   Highest education level: 9th grade  Occupational History    Comment: retired Radio broadcast assistant  Tobacco Use   Smoking status: Former    Current packs/day: 0.00    Types: Cigarettes    Quit date: 04/21/2005    Years since quitting: 18.0   Smokeless tobacco: Never  Vaping Use   Vaping status: Never Used  Substance and Sexual Activity   Alcohol use: No   Drug use: No   Sexual activity: Not Currently  Other Topics Concern   Not on file  Social History Narrative   06/05/21 Lives with wife   Right handed   Caffeine: 1 C of coffee a day   Social Drivers of Corporate investment banker Strain: Not on file  Food Insecurity: No Food Insecurity (04/19/2023)   Hunger Vital Sign    Worried About Running Out of Food in the Last Year: Never true    Ran  Out of Food in the Last Year: Never true  Transportation Needs: No Transportation Needs (04/19/2023)   PRAPARE - Administrator, Civil Service (Medical): No    Lack of Transportation (Non-Medical): No  Physical Activity: Not on file  Stress: Not on file  Social Connections: Not on file  Intimate Partner Violence: Not At Risk  (04/19/2023)   Humiliation, Afraid, Rape, and Kick questionnaire    Fear of Current or Ex-Partner: No    Emotionally Abused: No    Physically Abused: No    Sexually Abused: No   PHYSICAL EXAM  Vitals:   05/19/23 0926  BP: (!) 124/58  Pulse: 63  Weight: 189 lb (85.7 kg)  Height: 5\' 9"  (1.753 m)    Body mass index is 27.91 kg/m.    05/19/2023    9:28 AM 09/17/2022   12:54 PM 03/20/2022    3:46 PM  MMSE - Mini Mental State Exam  Orientation to time 4 2 5   Orientation to Place 3 4 4   Registration 3 3 3   Attention/ Calculation 0 0 0  Recall 2 0 0  Language- name 2 objects 2 2 2   Language- repeat 1 1 0  Language- follow 3 step command 3 3 2   Language- read & follow direction 0 0 0  Language-read & follow direction-comments  unable to see   Write a sentence 0 0 0  Copy design 0 0 0  Total score 18 15 16    Generalized: Well developed, in no acute distress, elderly male Neurological examination  Mentation: Alert, is quiet, wife provides most history.  He seems somewhat disinterested, has no complaints.  Follows all commands speech and language fluent Cranial nerve II-XII: Pupils were equal round reactive to light. Extraocular movements were full, visual field were full on confrontational test. Facial sensation and strength were normal.  Head turning and shoulder shrug  were normal and symmetric. Motor: The motor testing reveals 5 over 5 strength of all 4 extremities. Good symmetric motor tone is noted throughout.  I do not see any significant bradykinesia or rigidity. Sensory: Sensory testing is intact to soft touch on all 4 extremities. No evidence of extinction is noted.  Coordination: Cerebellar testing reveals good finger-nose-finger and heel-to-shin bilaterally.  I do not see any tremor with action or rest Gait and station: Has to push off from seated position, gait is wide-based, cautious, decreased arm swing on the right, stooped posture Reflexes: Deep tendon reflexes are  symmetric but decreased  DIAGNOSTIC DATA (LABS, IMAGING, TESTING) - I reviewed patient records, labs, notes, testing and imaging myself where available.  Lab Results  Component Value Date   WBC 5.3 04/17/2023   HGB 10.6 (L) 04/17/2023   HCT 33.9 (L) 04/17/2023   MCV 80.1 04/17/2023   PLT 150 04/17/2023      Component Value Date/Time   NA 137 04/19/2023 0823   K 3.3 (L) 04/19/2023 0823   CL 103 04/19/2023 0823   CO2 28 04/19/2023 0823   GLUCOSE 147 (H) 04/19/2023 0823   BUN 23 04/19/2023 0823   CREATININE 1.15 04/19/2023 0823   CALCIUM 8.8 (L) 04/19/2023 0823   PROT 6.8 04/17/2023 0427   ALBUMIN 2.9 (L) 04/17/2023 0427   AST 11 (L) 04/17/2023 0427   ALT 5 04/17/2023 0427   ALKPHOS 65 04/17/2023 0427   BILITOT 0.7 04/17/2023 0427   GFRNONAA >60 04/19/2023 0823   GFRAA >60 10/30/2017 0437   Lab Results  Component Value Date  CHOL 96 07/23/2022   HDL 30 (L) 07/23/2022   LDLCALC 54 07/23/2022   TRIG 59 07/23/2022   CHOLHDL 3.2 07/23/2022   Lab Results  Component Value Date   HGBA1C 8.3 (H) 04/16/2023   Lab Results  Component Value Date   VITAMINB12 821 06/05/2021   Lab Results  Component Value Date   TSH 4.078 07/23/2022    Margie Ege, AGNP-C, DNP 05/19/2023, 9:40 AM [facility] [address] [place], [place] [address] [phone]

## 2023-05-19 ENCOUNTER — Telehealth: Payer: Self-pay | Admitting: Neurology

## 2023-05-19 ENCOUNTER — Encounter: Payer: Self-pay | Admitting: Neurology

## 2023-05-19 ENCOUNTER — Ambulatory Visit: Payer: Medicare HMO | Admitting: Neurology

## 2023-05-19 VITALS — BP 124/58 | HR 63 | Ht 69.0 in | Wt 189.0 lb

## 2023-05-19 DIAGNOSIS — G218 Other secondary parkinsonism: Secondary | ICD-10-CM

## 2023-05-19 DIAGNOSIS — F03B18 Unspecified dementia, moderate, with other behavioral disturbance: Secondary | ICD-10-CM

## 2023-05-19 DIAGNOSIS — G4733 Obstructive sleep apnea (adult) (pediatric): Secondary | ICD-10-CM

## 2023-05-19 NOTE — Telephone Encounter (Signed)
At 10:15 pt's daughter left vm stating Sarah, NP called her asking about pt's meds.  Pt's daughter is asking for a call back at [facility] [phone], if not available she is asking to have her name paged so she can take call.

## 2023-05-19 NOTE — Patient Instructions (Signed)
We will continue current medications.  I called Annabelle Harman to verify medications, I left her message.  Recommend increased physical activity, brain stimulating activity.  Recommend using CPAP nightly for minimum of 4 hours.  Need to follow-up with primary care doctor about recommendation to use continuous oxygen.  Follow-up here in about 6 months.  Thanks

## 2023-05-20 ENCOUNTER — Ambulatory Visit (INDEPENDENT_AMBULATORY_CARE_PROVIDER_SITE_OTHER): Payer: Medicare HMO | Admitting: Nurse Practitioner

## 2023-05-20 ENCOUNTER — Ambulatory Visit: Payer: Medicare HMO

## 2023-05-20 VITALS — BP 124/62 | HR 63 | Temp 97.5°F | Ht 66.0 in | Wt 187.8 lb

## 2023-05-20 DIAGNOSIS — U071 COVID-19: Secondary | ICD-10-CM

## 2023-05-20 DIAGNOSIS — J1282 Pneumonia due to coronavirus disease 2019: Secondary | ICD-10-CM | POA: Diagnosis not present

## 2023-05-20 DIAGNOSIS — J449 Chronic obstructive pulmonary disease, unspecified: Secondary | ICD-10-CM | POA: Diagnosis not present

## 2023-05-20 DIAGNOSIS — J9601 Acute respiratory failure with hypoxia: Secondary | ICD-10-CM

## 2023-05-20 DIAGNOSIS — R590 Localized enlarged lymph nodes: Secondary | ICD-10-CM

## 2023-05-20 DIAGNOSIS — R918 Other nonspecific abnormal finding of lung field: Secondary | ICD-10-CM | POA: Diagnosis not present

## 2023-05-20 DIAGNOSIS — I700 Atherosclerosis of aorta: Secondary | ICD-10-CM | POA: Diagnosis not present

## 2023-05-20 MED ORDER — STIOLTO RESPIMAT 2.5-2.5 MCG/ACT IN AERS: 2.0000 | INHALATION_SPRAY | Freq: Every day | RESPIRATORY_TRACT | 5 refills | Status: AC

## 2023-05-20 NOTE — Progress Notes (Unsigned)
@Patient  ID: [name], male    DOB: 1941-03-23, 83 y.o.   MRN: [id number]  Chief Complaint  Patient presents with   Follow-up    Referring provider: Laurann Montana, MD  HPI: 83 year old male, former smoker followed for COPD, suspected sarcoidosis based on mediastinal adenopathy.  He is a patient of Dr. Kavin Leech and last seen in office 08/13/2022.  Past medical history significant for hypertension, OSA on CPAP, DM 2, moderate dementia, Parkinson's.  TEST/EVENTS:  07/23/2022 CTA chest: No evidence of PE.  Atherosclerosis/CAD.  Mediastinal and hilar adenopathy again identified, appears slightly progressive.  Mild paraseptal emphysema.  Mild subpleural bibasilar predominant pulmonary fibrotic change.  11 mm noncalcified pulmonary nodule in right upper lobe, stable. 04/16/2023 CXR: Peripheral hazy opacity in both mid lungs, concerning for multifocal pneumonia  08/13/2022: OV with Dr. Delton Coombes.  Previously followed by Dr. Craige Cotta.  History of tobacco use, bilateral mediastinal hilar adenopathy and subpleural nodular disease, interstitial changes that are felt to be consistent with sarcoidosis.  Unclear if he was ever biopsied.  Has severe OSA on CPAP that is managed at [facility] neurology.  Attempted pulmonary function testing 02/12/2022 but unable to complete due to technique.  Admitted in early April with urinary tract infection, nausea vomiting and abdominal discomfort.  During that admission found to be hypoxemic and started on supplemental O2.  Had chest imaging that showed paraseptal emphysema, mediastinal and hilar adenopathy suspicious for possible chronic lymphoproliferative process or even metastatic disease.  Reports that he is feeling better.  Breathing well.  Not using O2.  Uses albuterol very rarely.  Lower extremity edema has resolved.  Typically has good compliance with his CPAP.  CT chest review showed mild progression of interstitial prominence compared with remote scans but no clear active  sarcoid.  Clinically stable.  Plan to follow his CT chest in 6 months to ensure interval stability.  No clear indication to treat with corticosteroids at this time.  Not currently on scheduled BD.  Continue to monitor and use albuterol as needed.  05/20/2023: Today-Hospital follow-up Discussed the use of AI scribe software for clinical note transcription with the patient, who gave verbal consent to proceed.  History of Present Illness   The patient presents for follow-up of respiratory issues post-hospitalization.  He was admitted 04/16/2023 to 04/19/2023 for acute respiratory failure secondary to COVID-pneumonia.  He had developed cough, increased shortness of breath for 5 days prior to admission.  RVP positive for COVID.  CXR showed multifocal pneumonia with opacities in bilateral mid lungs.  He was treated with IV remdesivir and steroids.  No antibiotics given viral illness.  He was discharged home on 2 L minute supplemental O2.  Approximately four weeks post-hospitalization, the patient's breathing has normalized per his report, and he is no longer using home oxygen, which was initially prescribed upon discharge. Not checking O2 levels at home. He uses albuterol, a rescue inhaler, about twice a day, which he does feel like helps improve his activity tolerance.   He is still having some cough with yellow sputum but has no fevers, chills, or hemoptysis. Feels like cough has significantly improved and sputum has lessened. No wheezing or chest congestion. No skin lesions or vision changes. He maintains normal eating and drinking habits. He uses CPAP at night.   He never had follow up 6 month CT chest that was due in October 2024. No night sweats, weight loss or anorexia reported. Has some fatigue, which is stable.  Allergies  Allergen Reactions   Ace Inhibitors Swelling    Benazepril   Allopurinol Rash    Immunization History  Administered Date(s) Administered   Influenza Split  02/11/2008, 04/02/2009, 02/05/2010, 02/02/2012   Influenza-Unspecified 01/25/2014, 01/23/2015, 12/14/2019, 01/19/2022   PFIZER(Purple Top)SARS-COV-2 Vaccination 05/26/2019, 06/20/2019, 02/25/2020   Pneumococcal Conjugate-13 09/29/2013   Pneumococcal Polysaccharide-23 02/11/2008   Tdap 09/01/2011, 06/25/2021   Zoster Recombinant(Shingrix) 06/11/2018, 10/07/2018   Zoster, Live 09/01/2011, 06/11/2018, 10/07/2018    Past Medical History:  Diagnosis Date   Arthritis    Bladder cancer (HCC)    in situ   Cataracts, bilateral    CHF (congestive heart failure) (HCC)    Chronic kidney disease    COPD (chronic obstructive pulmonary disease) (HCC)    Dementia (HCC)    Diabetes mellitus    insulin dependent   Glaucoma    Gout    Hearing loss    Heart disease, unspecified    Hypercholesterolemia    Hypertension    Memory changes    Neuromuscular disorder (HCC)    parkinson's   Prostate cancer (HCC)    s/p radiation   Sarcoidosis    Stroke (HCC)    Unspecified glaucoma(365.9)     Tobacco History: Social History   Tobacco Use  Smoking Status Former   Current packs/day: 0.00   Types: Cigarettes   Quit date: 04/21/2005   Years since quitting: 18.0  Smokeless Tobacco Never   Counseling given: Not Answered   Outpatient Medications Prior to Visit  Medication Sig Dispense Refill   ACCU-CHEK AVIVA PLUS test strip USE AS INSTRUCTED TO CHECK BLOOD SUGAR THREE TIMES DAILY 300 each 1   acetaminophen (TYLENOL) 325 MG tablet Take 650 mg by mouth every 6 (six) hours as needed for moderate pain.     albuterol (VENTOLIN HFA) 108 (90 Base) MCG/ACT inhaler Inhale 2 puffs into the lungs every 6 (six) hours as needed for wheezing or shortness of breath. 17 each 2   amLODipine (NORVASC) 10 MG tablet Take 1 tablet (10 mg total) by mouth daily. (Patient taking differently: Take 10 mg by mouth in the morning.) 30 tablet 1   aspirin 81 MG tablet Take 81 mg by mouth at bedtime.      atorvastatin  (LIPITOR) 20 MG tablet Take 20 mg by mouth every evening.     Blood Glucose Monitoring Suppl (ACCU-CHEK AVIVA PLUS) w/Device KIT Use to check blood sugar 3 times per day dx code E11.65 1 kit 0   carbidopa-levodopa (SINEMET IR) 25-100 MG tablet Take 1 pill in the morning, 1 pill in the afternoon, and 2 pills at night 360 tablet 3   donepezil (ARICEPT) 10 MG tablet Take 1 tablet (10 mg total) by mouth at bedtime. 90 tablet 3   dorzolamide-timolol (COSOPT) 22.3-6.8 MG/ML ophthalmic solution Place 1 drop into the right eye 2 (two) times daily.     escitalopram (LEXAPRO) 10 MG tablet Take 1 tablet (10 mg total) by mouth daily. (Patient taking differently: Take 10 mg by mouth at bedtime.) 90 tablet 3   ferrous sulfate 325 (65 FE) MG tablet Take 1 tablet (325 mg total) by mouth 2 (two) times daily. (Patient taking differently: Take 325 mg by mouth every evening.) 60 tablet 3   fish oil-omega-3 fatty acids 1000 MG capsule Take 1 g by mouth in the morning.     guaifenesin (ROBITUSSIN) 100 MG/5ML syrup Take 200 mg by mouth 3 (three) times daily as needed for cough.  Insulin Syringe-Needle U-100 (INSULIN SYRINGE .5CC/31GX5/16") 31G X 5/16" 0.5 ML MISC Use one to inject insulin daily 30 each 1   latanoprost (XALATAN) 0.005 % ophthalmic solution Place 1 drop into the left eye at bedtime.     loperamide (IMODIUM A-D) 2 MG tablet Take 2 mg by mouth 4 (four) times daily as needed for diarrhea or loose stools.     meclizine (ANTIVERT) 25 MG tablet Take 1 tablet (25 mg total) by mouth 3 (three) times daily as needed for dizziness. 30 tablet 0   memantine (NAMENDA) 10 MG tablet Take 1 tablet (10 mg total) by mouth 2 (two) times daily. 180 tablet 3   metoprolol tartrate (LOPRESSOR) 50 MG tablet Take 50 mg by mouth 2 (two) times daily.     Multiple Vitamins-Minerals (CENTRUM SILVER ADULT 50+) TABS Take 1 tablet by mouth in the morning.     Multiple Vitamins-Minerals (PRESERVISION AREDS) TABS Take 1 capsule by mouth in  the morning.     NOVOLIN 70/30 (70-30) 100 UNIT/ML injection Inject 5-12 Units into the skin 3 (three) times daily as needed (high blood sugar).     predniSONE (DELTASONE) 10 MG tablet Take by mouth.     tamsulosin (FLOMAX) 0.4 MG CAPS capsule Take 1 capsule (0.4 mg total) by mouth at bedtime. 30 capsule 1   tiZANidine (ZANAFLEX) 2 MG tablet Take 2 mg by mouth at bedtime as needed.     No facility-administered medications prior to visit.     Review of Systems:   Constitutional: No weight loss or gain, night sweats, fevers, chills, or lassitude. +fatigue (baseline) HEENT: No headaches, difficulty swallowing, tooth/dental problems, or sore throat. No sneezing, itching, ear ache, nasal congestion, or post nasal drip CV:  No chest pain, orthopnea, PND, swelling in lower extremities, anasarca, dizziness, palpitations, syncope Resp: +shortness of breath with exertion; improved productive cough. No excess mucus or change in color of mucus. No hemoptysis. No wheezing.  No chest wall deformity GI:  No heartburn, indigestion, abdominal pain, nausea, vomiting, diarrhea, change in bowel habits, loss of appetite, bloody stools.  GU: No dysuria, change in color of urine, urgency or frequency.  No flank pain, no hematuria  Skin: No rash, lesions, ulcerations MSK:  No joint pain or swelling.   Neuro: No dizziness or lightheadedness.  Psych: No depression or anxiety. Mood stable.     Physical Exam:  BP 124/62 (BP Location: Right Arm, Patient Position: Sitting, Cuff Size: Large)   Pulse 63   Temp (!) 97.5 F (36.4 C) (Temporal)   Ht 5\' 6"  (1.676 m)   Wt 187 lb 12.8 oz (85.2 kg)   SpO2 96%   BMI 30.31 kg/m   GEN: Pleasant, interactive, well-kempt; elderly; in no acute distress HEENT:  Normocephalic and atraumatic. PERRLA. Sclera white. Nasal turbinates pink, moist and patent bilaterally. No rhinorrhea present. Oropharynx pink and moist, without exudate or edema. No lesions, ulcerations, or  postnasal drip.  NECK:  Supple w/ fair ROM. No JVD present. Normal carotid impulses w/o bruits. Thyroid symmetrical with no goiter or nodules palpated. No lymphadenopathy.   CV: RRR, no m/r/g, no peripheral edema. Pulses intact, +2 bilaterally. No cyanosis, pallor or clubbing. PULMONARY:  Unlabored, regular breathing. Minimal expiratory wheeze b/l bases otherwise clear bilaterally A&P. No accessory muscle use.  GI: BS present and normoactive. Soft, non-tender to palpation. No organomegaly or masses detected.  MSK: No erythema, warmth or tenderness. Cap refil <2 sec all extrem. No deformities or joint swelling  noted.  Neuro: A/Ox3. No focal deficits noted.   Skin: Warm, no lesions or rashe Psych: Normal affect and behavior. Judgement and thought content appropriate.     Lab Results:  CBC    Component Value Date/Time   WBC 5.3 04/17/2023 0427   RBC 4.23 04/17/2023 0427   HGB 10.6 (L) 04/17/2023 0427   HGB 12.2 (L) 09/30/2006 1510   HCT 33.9 (L) 04/17/2023 0427   HCT 37.0 (L) 09/30/2006 1510   PLT 150 04/17/2023 0427   PLT 216 09/30/2006 1510   MCV 80.1 04/17/2023 0427   MCV 72.6 (L) 09/30/2006 1510   MCH 25.1 (L) 04/17/2023 0427   MCHC 31.3 04/17/2023 0427   RDW 16.9 (H) 04/17/2023 0427   RDW 22.4 (H) 09/30/2006 1510   LYMPHSABS 1.1 04/17/2023 0427   LYMPHSABS 2.4 09/30/2006 1510   MONOABS 0.6 04/17/2023 0427   MONOABS 0.8 09/30/2006 1510   EOSABS 0.0 04/17/2023 0427   EOSABS 0.1 09/30/2006 1510   BASOSABS 0.0 04/17/2023 0427   BASOSABS 0.0 09/30/2006 1510    BMET    Component Value Date/Time   NA 137 04/19/2023 0823   K 3.3 (L) 04/19/2023 0823   CL 103 04/19/2023 0823   CO2 28 04/19/2023 0823   GLUCOSE 147 (H) 04/19/2023 0823   BUN 23 04/19/2023 0823   CREATININE 1.15 04/19/2023 0823   CALCIUM 8.8 (L) 04/19/2023 0823   GFRNONAA >60 04/19/2023 0823   GFRAA >60 10/30/2017 0437    BNP    Component Value Date/Time   BNP 336.2 (H) 04/16/2023 1136      Imaging:  No results found.  Administration History     None          Latest Ref Rng & Units 02/12/2022   10:01 AM  PFT Results  DLCO uncorrected ml/min/mmHg 9.76   DLCO UNC% % 41   DLCO corrected ml/min/mmHg 10.40   DLCO COR %Predicted % 43   DLVA Predicted % 58   TLC L 4.86   TLC % Predicted % 70   RV % Predicted % 121     No results found for: "NITRICOXIDE"      Assessment & Plan:   COPD (chronic obstructive pulmonary disease) (HCC) Presumed COPD with emphysematous changes on prior imaging. Unable to complete PFTs. Recent exacerbation secondary to COVID pneumonia. Clinically improved. He has mild bronchospasm on exam. No current controller regimen. Will start him on LABA/LAMA therapy with Stiolto. Educated on proper use. Side effect profile reviewed. Action plan in place. Encouraged to work on graded exercises.  Patient Instructions  Continue Albuterol inhaler 2 puffs every 6 hours as needed for shortness of breath or wheezing. Notify if symptoms persist despite rescue inhaler/neb use.  Continue guaifenesin over-the-counter twice daily as needed for cough/congestion Start Stiolto 2 puffs daily.  This is your new maintenance inhaler which you will use regardless of symptoms.  You will still have your albuterol for emergency/rescue use.  Repeat chest x-ray today  You did not have any significant low oxygen levels on your walk test today.  We will place orders to discontinue her oxygen as long as your chest x-ray looks good and symptoms continue to be stable  Follow-up in 6 weeks with Dr. Delton Coombes or Philis Nettle. If symptoms do not improve or worsen, please contact office for sooner follow up or seek emergency care.    Acute respiratory failure with hypoxia (HCC) Resolved. No exertional hypoxia during walk test today on room air. SpO2  low 89% but recovered quickly with rest. Advised to monitor at home for goal >88-90%. If CXR improved, will place order to d/c  supplemental O2.   Pneumonia due to COVID-19 virus COVID pneumonia, treated with antiviral. Clinically improved. Repeat CXR today to ensure resolution.   Mediastinal lymphadenopathy Presumed sarcoidosis. Mild progression on previous CT imaging from 07/2022. He was supposed to have repeat CT chest in 6 months (October 2024). Unfortunately was not completed. Will ensure CXR shows improved aeration following COVID pneumonia and then schedule follow up CT chest.    Advised if symptoms do not improve or worsen, to please contact office for sooner follow up or seek emergency care.   I spent 45 minutes of dedicated to the care of this patient on the date of this encounter to include pre-visit review of records, face-to-face time with the patient discussing conditions above, post visit ordering of testing, clinical documentation with the electronic health record, making appropriate referrals as documented, and communicating necessary findings to members of the patients care team.  Noemi Chapel, NP 05/21/2023  Pt aware and understands NP's role.

## 2023-05-20 NOTE — Patient Instructions (Addendum)
Continue Albuterol inhaler 2 puffs every 6 hours as needed for shortness of breath or wheezing. Notify if symptoms persist despite rescue inhaler/neb use.  Continue guaifenesin over-the-counter twice daily as needed for cough/congestion Start Stiolto 2 puffs daily.  This is your new maintenance inhaler which you will use regardless of symptoms.  You will still have your albuterol for emergency/rescue use.  Repeat chest x-ray today  You did not have any significant low oxygen levels on your walk test today.  We will place orders to discontinue her oxygen as long as your chest x-ray looks good and symptoms continue to be stable  Follow-up in 6 weeks with Dr. Delton Coombes or Philis Nettle. If symptoms do not improve or worsen, please contact office for sooner follow up or seek emergency care.

## 2023-05-21 ENCOUNTER — Encounter: Payer: Self-pay | Admitting: Nurse Practitioner

## 2023-05-21 DIAGNOSIS — U071 COVID-19: Secondary | ICD-10-CM | POA: Insufficient documentation

## 2023-05-21 NOTE — Assessment & Plan Note (Signed)
Presumed sarcoidosis. Mild progression on previous CT imaging from 07/2022. He was supposed to have repeat CT chest in 6 months (October 2024). Unfortunately was not completed. Will ensure CXR shows improved aeration following COVID pneumonia and then schedule follow up CT chest.

## 2023-05-21 NOTE — Assessment & Plan Note (Signed)
Resolved. No exertional hypoxia during walk test today on room air. SpO2 low 89% but recovered quickly with rest. Advised to monitor at home for goal >88-90%. If CXR improved, will place order to d/c supplemental O2.

## 2023-05-21 NOTE — Assessment & Plan Note (Signed)
COVID pneumonia, treated with antiviral. Clinically improved. Repeat CXR today to ensure resolution.

## 2023-05-21 NOTE — Assessment & Plan Note (Signed)
Presumed COPD with emphysematous changes on prior imaging. Unable to complete PFTs. Recent exacerbation secondary to COVID pneumonia. Clinically improved. He has mild bronchospasm on exam. No current controller regimen. Will start him on LABA/LAMA therapy with Stiolto. Educated on proper use. Side effect profile reviewed. Action plan in place. Encouraged to work on graded exercises.  Patient Instructions  Continue Albuterol inhaler 2 puffs every 6 hours as needed for shortness of breath or wheezing. Notify if symptoms persist despite rescue inhaler/neb use.  Continue guaifenesin over-the-counter twice daily as needed for cough/congestion Start Stiolto 2 puffs daily.  This is your new maintenance inhaler which you will use regardless of symptoms.  You will still have your albuterol for emergency/rescue use.  Repeat chest x-ray today  You did not have any significant low oxygen levels on your walk test today.  We will place orders to discontinue her oxygen as long as your chest x-ray looks good and symptoms continue to be stable  Follow-up in 6 weeks with Dr. Delton Coombes or Philis Nettle. If symptoms do not improve or worsen, please contact office for sooner follow up or seek emergency care.

## 2023-06-01 ENCOUNTER — Telehealth: Payer: Self-pay | Admitting: Neurology

## 2023-06-01 NOTE — Telephone Encounter (Signed)
 Called and spoke to wife and she said he doesn't need inhaler

## 2023-06-01 NOTE — Telephone Encounter (Signed)
 The patient's daughter left a message that the inhaler that was prescribed for him will cost him $381 and he can't afford that. She wants to know if he needs to go back on oxygen or if there is something else he can do. Her name was Irwin Manual [phone]

## 2023-06-01 NOTE — Telephone Encounter (Signed)
 We do not prescribe him any inhaler medication. Thanks

## 2023-06-10 DIAGNOSIS — E1165 Type 2 diabetes mellitus with hyperglycemia: Secondary | ICD-10-CM | POA: Diagnosis not present

## 2023-06-16 NOTE — Progress Notes (Signed)
 Spoke with pt and notified of results per [employer].  Pt verbalized understanding and denied any questions. ?

## 2023-06-17 ENCOUNTER — Emergency Department (HOSPITAL_COMMUNITY)
Admission: EM | Admit: 2023-06-17 | Discharge: 2023-06-17 | Disposition: A | Discharge status: Home / Self Care | Payer: Medicare HMO

## 2023-06-17 ENCOUNTER — Telehealth (HOSPITAL_COMMUNITY): Payer: Self-pay

## 2023-06-17 ENCOUNTER — Other Ambulatory Visit: Payer: Self-pay

## 2023-06-17 ENCOUNTER — Emergency Department (HOSPITAL_COMMUNITY): Payer: Medicare HMO

## 2023-06-17 DIAGNOSIS — R0981 Nasal congestion: Secondary | ICD-10-CM | POA: Diagnosis not present

## 2023-06-17 DIAGNOSIS — R109 Unspecified abdominal pain: Secondary | ICD-10-CM | POA: Insufficient documentation

## 2023-06-17 DIAGNOSIS — I10 Essential (primary) hypertension: Secondary | ICD-10-CM | POA: Diagnosis not present

## 2023-06-17 DIAGNOSIS — R231 Pallor: Secondary | ICD-10-CM | POA: Diagnosis not present

## 2023-06-17 DIAGNOSIS — K5790 Diverticulosis of intestine, part unspecified, without perforation or abscess without bleeding: Secondary | ICD-10-CM | POA: Diagnosis not present

## 2023-06-17 DIAGNOSIS — K8689 Other specified diseases of pancreas: Secondary | ICD-10-CM | POA: Diagnosis not present

## 2023-06-17 DIAGNOSIS — Z79899 Other long term (current) drug therapy: Secondary | ICD-10-CM | POA: Diagnosis not present

## 2023-06-17 DIAGNOSIS — Z7982 Long term (current) use of aspirin: Secondary | ICD-10-CM | POA: Diagnosis not present

## 2023-06-17 DIAGNOSIS — R197 Diarrhea, unspecified: Secondary | ICD-10-CM | POA: Diagnosis not present

## 2023-06-17 DIAGNOSIS — Z794 Long term (current) use of insulin: Secondary | ICD-10-CM | POA: Insufficient documentation

## 2023-06-17 DIAGNOSIS — R112 Nausea with vomiting, unspecified: Secondary | ICD-10-CM | POA: Insufficient documentation

## 2023-06-17 DIAGNOSIS — R509 Fever, unspecified: Secondary | ICD-10-CM | POA: Insufficient documentation

## 2023-06-17 LAB — CBC WITH DIFFERENTIAL/PLATELET
Abs Immature Granulocytes: 0.01 10*3/uL (ref 0.00–0.07)
Basophils Absolute: 0 10*3/uL (ref 0.0–0.1)
Basophils Relative: 0 %
Eosinophils Absolute: 0.1 10*3/uL (ref 0.0–0.5)
Eosinophils Relative: 2 %
HCT: 38.3 % — ABNORMAL LOW (ref 39.0–52.0)
Hemoglobin: 12.1 g/dL — ABNORMAL LOW (ref 13.0–17.0)
Immature Granulocytes: 0 %
Lymphocytes Relative: 34 %
Lymphs Abs: 1.9 10*3/uL (ref 0.7–4.0)
MCH: 24.5 pg — ABNORMAL LOW (ref 26.0–34.0)
MCHC: 31.6 g/dL (ref 30.0–36.0)
MCV: 77.7 fL — ABNORMAL LOW (ref 80.0–100.0)
Monocytes Absolute: 0.9 10*3/uL (ref 0.1–1.0)
Monocytes Relative: 15 %
Neutro Abs: 2.7 10*3/uL (ref 1.7–7.7)
Neutrophils Relative %: 49 %
Platelets: 123 10*3/uL — ABNORMAL LOW (ref 150–400)
RBC: 4.93 MIL/uL (ref 4.22–5.81)
RDW: 17.4 % — ABNORMAL HIGH (ref 11.5–15.5)
WBC: 5.7 10*3/uL (ref 4.0–10.5)
nRBC: 0 % (ref 0.0–0.2)

## 2023-06-17 LAB — RESP PANEL BY RT-PCR (RSV, FLU A&B, COVID)  RVPGX2
Influenza A by PCR: NEGATIVE
Influenza B by PCR: NEGATIVE
Resp Syncytial Virus by PCR: NEGATIVE
SARS Coronavirus 2 by RT PCR: NEGATIVE

## 2023-06-17 LAB — COMPREHENSIVE METABOLIC PANEL
ALT: 6 U/L (ref 0–44)
AST: 14 U/L — ABNORMAL LOW (ref 15–41)
Albumin: 3.3 g/dL — ABNORMAL LOW (ref 3.5–5.0)
Alkaline Phosphatase: 68 U/L (ref 38–126)
Anion gap: 10 (ref 5–15)
BUN: 23 mg/dL (ref 8–23)
CO2: 25 mmol/L (ref 22–32)
Calcium: 8.9 mg/dL (ref 8.9–10.3)
Chloride: 107 mmol/L (ref 98–111)
Creatinine, Ser: 1.27 mg/dL — ABNORMAL HIGH (ref 0.61–1.24)
GFR, Estimated: 57 mL/min — ABNORMAL LOW (ref >60)
Glucose, Bld: 160 mg/dL — ABNORMAL HIGH (ref 70–99)
Potassium: 3.5 mmol/L (ref 3.5–5.1)
Sodium: 142 mmol/L (ref 135–145)
Total Bilirubin: 0.8 mg/dL (ref 0.0–1.2)
Total Protein: 7.2 g/dL (ref 6.5–8.1)

## 2023-06-17 LAB — LIPASE, BLOOD: Lipase: 49 U/L (ref 11–51)

## 2023-06-17 MED ORDER — ONDANSETRON 4 MG PO TBDP: 4.0000 mg | ORAL_TABLET | Freq: Three times a day (TID) | ORAL | 0 refills | Status: AC | PRN

## 2023-06-17 MED ORDER — LACTATED RINGERS IV BOLUS
1000.0000 mL | Freq: Once | INTRAVENOUS | Status: AC
  Administered 2023-06-17: 1000 mL via INTRAVENOUS

## 2023-06-17 MED ORDER — ONDANSETRON HCL 4 MG/2ML IJ SOLN
4.0000 mg | Freq: Once | INTRAMUSCULAR | Status: AC
  Administered 2023-06-17: 4 mg via INTRAVENOUS
  Filled 2023-06-17: qty 2

## 2023-06-17 MED ORDER — MORPHINE SULFATE (PF) 2 MG/ML IV SOLN
2.0000 mg | Freq: Once | INTRAVENOUS | Status: AC
  Administered 2023-06-17: 2 mg via INTRAVENOUS
  Filled 2023-06-17: qty 1

## 2023-06-17 MED ORDER — ONDANSETRON 4 MG PO TBDP
4.0000 mg | ORAL_TABLET | Freq: Three times a day (TID) | ORAL | 0 refills | Status: AC | PRN
Start: 2023-06-17 — End: ?

## 2023-06-17 MED ORDER — IOHEXOL 300 MG/ML  SOLN
100.0000 mL | Freq: Once | INTRAMUSCULAR | Status: AC | PRN
  Administered 2023-06-17: 100 mL via INTRAVENOUS

## 2023-06-17 NOTE — ED Provider Notes (Signed)
 [name] EMERGENCY DEPARTMENT AT [facility] Provider Note   CSN: [id number] Arrival date & time: 06/17/23  1005     History  No chief complaint on file.   [name] is a 83 y.o. male.  83 year old male with past medical history of hypertension and hyperlipidemia presenting to the emergency department today with abdominal pain, vomiting, and diarrhea.  This is apparently been going on since yesterday.  The patient states he has had some fevers at home.  He has had some episodes of nonbloody, nonbilious emesis.  Patient states that he is having periumbilical abdominal pain with this.  He denies any abdominal surgeries.  Denies any associated urinary symptoms.  He is having some nasal congestion but denies any cough.  He denies any blood in the stool or dark stools.  States he has vomited multiple times this morning prompting him to come to the ER today for further evaluation.        Home Medications Prior to Admission medications   Medication Sig Start Date End Date Taking? Authorizing Provider  ondansetron (ZOFRAN-ODT) 4 MG disintegrating tablet Take 1 tablet (4 mg total) by mouth every 8 (eight) hours as needed for nausea or vomiting. 06/17/23  Yes Durwin Glaze, MD  ACCU-CHEK AVIVA PLUS test strip USE AS INSTRUCTED TO CHECK BLOOD SUGAR THREE TIMES DAILY 11/11/16   Reather Littler, MD  acetaminophen (TYLENOL) 325 MG tablet Take 650 mg by mouth every 6 (six) hours as needed for moderate pain.    [provider]  albuterol (VENTOLIN HFA) 108 (90 Base) MCG/ACT inhaler Inhale 2 puffs into the lungs every 6 (six) hours as needed for wheezing or shortness of breath. 04/19/23   Regalado, Belkys A, MD  amLODipine (NORVASC) 10 MG tablet Take 1 tablet (10 mg total) by mouth daily. Patient taking differently: Take 10 mg by mouth in the morning. 10/30/17   Shon Hale, MD  aspirin 81 MG tablet Take 81 mg by mouth at bedtime.     [provider]  atorvastatin  (LIPITOR) 20 MG tablet Take 20 mg by mouth every evening. 12/16/18   [provider]  Blood Glucose Monitoring Suppl (ACCU-CHEK AVIVA PLUS) w/Device KIT Use to check blood sugar 3 times per day dx code E11.65 03/12/16   Reather Littler, MD  carbidopa-levodopa (SINEMET IR) 25-100 MG tablet Take 1 pill in the morning, 1 pill in the afternoon, and 2 pills at night 09/17/22   Glean Salvo, NP  donepezil (ARICEPT) 10 MG tablet Take 1 tablet (10 mg total) by mouth at bedtime. 09/17/22   Glean Salvo, NP  dorzolamide-timolol (COSOPT) 22.3-6.8 MG/ML ophthalmic solution Place 1 drop into the right eye 2 (two) times daily.    [provider]  escitalopram (LEXAPRO) 10 MG tablet Take 1 tablet (10 mg total) by mouth daily. Patient taking differently: Take 10 mg by mouth at bedtime. 09/17/22   Glean Salvo, NP  ferrous sulfate 325 (65 FE) MG tablet Take 1 tablet (325 mg total) by mouth 2 (two) times daily. Patient taking differently: Take 325 mg by mouth every evening. 10/30/17   Shon Hale, MD  fish oil-omega-3 fatty acids 1000 MG capsule Take 1 g by mouth in the morning.    [provider]  guaifenesin (ROBITUSSIN) 100 MG/5ML syrup Take 200 mg by mouth 3 (three) times daily as needed for cough.    [provider]  Insulin Syringe-Needle U-100 (INSULIN SYRINGE .5CC/31GX5/16") 31G X 5/16" 0.5  ML MISC Use one to inject insulin daily 05/02/15   Reather Littler, MD  latanoprost (XALATAN) 0.005 % ophthalmic solution Place 1 drop into the left eye at bedtime. 02/17/14   [provider]  loperamide (IMODIUM A-D) 2 MG tablet Take 2 mg by mouth 4 (four) times daily as needed for diarrhea or loose stools.    [provider]  meclizine (ANTIVERT) 25 MG tablet Take 1 tablet (25 mg total) by mouth 3 (three) times daily as needed for dizziness. 07/05/16   Dione Booze, MD  memantine (NAMENDA) 10 MG tablet Take 1 tablet (10 mg total) by mouth 2 (two) times daily. 09/17/22    Glean Salvo, NP  metoprolol tartrate (LOPRESSOR) 50 MG tablet Take 50 mg by mouth 2 (two) times daily.    [provider]  Multiple Vitamins-Minerals (CENTRUM SILVER ADULT 50+) TABS Take 1 tablet by mouth in the morning.    [provider]  Multiple Vitamins-Minerals (PRESERVISION AREDS) TABS Take 1 capsule by mouth in the morning.    [provider]  NOVOLIN 70/30 (70-30) 100 UNIT/ML injection Inject 5-12 Units into the skin 3 (three) times daily as needed (high blood sugar). 05/16/19   [provider]  predniSONE (DELTASONE) 10 MG tablet Take by mouth. 04/29/23   [provider]  tamsulosin (FLOMAX) 0.4 MG CAPS capsule Take 1 capsule (0.4 mg total) by mouth at bedtime. 10/30/17   Shon Hale, MD  Tiotropium Bromide-Olodaterol (STIOLTO RESPIMAT) 2.5-2.5 MCG/ACT AERS Inhale 2 puffs into the lungs daily. 05/20/23   Cobb, Ruby Cola, NP  tiZANidine (ZANAFLEX) 2 MG tablet Take 2 mg by mouth at bedtime as needed. 04/29/23   [provider]      Allergies    Ace inhibitors and Allopurinol    Review of Systems   Review of Systems  Gastrointestinal:  Positive for abdominal pain, diarrhea, nausea and vomiting.  All other systems reviewed and are negative.   Physical Exam Updated Vital Signs BP 134/70   Pulse (!) 57   Temp 98 F (36.7 C) (Oral)   Resp 14   SpO2 97%  Physical Exam Vitals and nursing note reviewed.   Gen: NAD Eyes: PERRL, EOMI HEENT: no oropharyngeal swelling Neck: trachea midline Resp: clear to auscultation bilaterally Card: RRR, no murmurs, rubs, or gallops Abd: Diffusely tender with no guarding or rebound, nondistended Extremities: no calf tenderness, no edema Vascular: 2+ radial pulses bilaterally, 2+ DP pulses bilaterally Skin: no rashes Psyc: Flat affect noted   ED Results / Procedures / Treatments   Labs (all labs ordered are listed, but only abnormal results are displayed) Labs Reviewed  CBC WITH  DIFFERENTIAL/PLATELET - Abnormal; Notable for the following components:      Result Value   Hemoglobin 12.1 (*)    HCT 38.3 (*)    MCV 77.7 (*)    MCH 24.5 (*)    RDW 17.4 (*)    Platelets 123 (*)    All other components within normal limits  COMPREHENSIVE METABOLIC PANEL - Abnormal; Notable for the following components:   Glucose, Bld 160 (*)    Creatinine, Ser 1.27 (*)    Albumin 3.3 (*)    AST 14 (*)    GFR, Estimated 57 (*)    All other components within normal limits  RESP PANEL BY RT-PCR (RSV, FLU A&B, COVID)  RVPGX2  LIPASE, BLOOD    EKG None  Radiology CT ABDOMEN PELVIS W CONTRAST Result Date: 06/17/2023 CLINICAL DATA:  Abdominal pain, acute, nonlocalized. Nausea vomiting and diarrhea. abdominal pain. EXAM: CT ABDOMEN AND PELVIS WITH CONTRAST TECHNIQUE: Multidetector CT imaging of the abdomen and pelvis was performed using the standard protocol following bolus administration of intravenous contrast. RADIATION DOSE REDUCTION: This exam was performed according to the departmental dose-optimization program which includes automated exposure control, adjustment of the mA and/or kV according to patient size and/or use of iterative reconstruction technique. CONTRAST:  OMNIPAQUE IOHEXOL 300 MG/ML  SOLN COMPARISON:  CT angiography abdomen and pelvis from 07/26/2022. FINDINGS: Lower chest: There are emphysematous changes in the visualized bilateral lungs. There are patchy areas of atelectasis and/or scarring as well. Bilateral lungs are otherwise clear. No pleural effusion. The heart is normal in size. No pericardial effusion. Hepatobiliary: The liver is normal in size. Non-cirrhotic configuration. No suspicious mass. These is mild diffuse hepatic steatosis. No intrahepatic or extrahepatic bile duct dilation. No calcified gallstones. Normal gallbladder wall thickness. No pericholecystic inflammatory changes. Pancreas: No focal pancreatic lesion. No peripancreatic fat stranding. There is  persistent mild dilation of main pancreatic duct measuring up to 6 mm in the pancreatic head/neck region; however, no obstructing mass. No pancreaticolith. Spleen: Within normal limits. No focal lesion. Adrenals/Urinary Tract: Adrenal glands are unremarkable. No suspicious renal mass. No hydronephrosis. Multiple bilateral renal sinus cysts noted. No renal or ureteric calculi. Unremarkable urinary bladder. Stomach/Bowel: No disproportionate dilation of the small or large bowel loops. No evidence of abnormal bowel wall thickening or inflammatory changes. The appendix is unremarkable. There are multiple diverticula , without imaging signs of diverticulitis. Vascular/Lymphatic: No ascites or pneumoperitoneum. No abdominal or pelvic lymphadenopathy, by size criteria. No aneurysmal dilation of the major abdominal arteries. There are marked peripheral atherosclerotic vascular calcifications of the aorta and its major branches. Reproductive: Normal size prostate. Symmetric seminal vesicles. Other: There are small fat containing umbilical and right inguinal hernias. The soft tissues and abdominal wall are otherwise unremarkable. Musculoskeletal: No suspicious osseous lesions. There are mild multilevel degenerative changes in the visualized spine. IMPRESSION: 1. No acute inflammatory process identified within the abdomen or pelvis. 2. Multiple other nonacute observations, as described above. 3. Aortic atherosclerosis. Aortic Atherosclerosis (ICD10-I70.0). Electronically Signed   By: Jules Schick M.D.   On: 06/17/2023 13:29    Procedures Procedures    Medications Ordered in ED Medications  morphine (PF) 2 MG/ML injection 2 mg (2 mg Intravenous Given 06/17/23 1059)  ondansetron (ZOFRAN) injection 4 mg (4 mg Intravenous Given 06/17/23 1058)  lactated ringers bolus 1,000 mL (1,000 mLs Intravenous New Bag/Given 06/17/23 1132)  iohexol (OMNIPAQUE) 300 MG/ML solution 100 mL (100 mLs Intravenous Contrast Given 06/17/23  1205)    ED Course/ Medical Decision Making/ A&P                                 Medical Decision Making 83 year old male with past medical history of hypertension hyperlipidemia presenting to the emergency department today with abdominal pain, nausea, vomiting, diarrhea.  I will further evaluate patient here with basic labs including LFTs and a lipase to evaluate for hepatobiliary pathology or pancreatitis.  Will obtain a CT scan of his abdomen to evaluate for appendicitis, diverticulitis, colitis, obstruction, or other intra-abdominal pathology.  Given his congestion here as well will obtain an RSV/COVID/flu swab on the patient to evaluate for viral etiologies.  I will give the patient morphine and Zofran for symptom as well as IV fluids and reevaluate for ultimate  disposition.  The patient's labs here are reassuring.  CT scan does not show any acute findings.  Patient is feeling better on reassessment and is tolerating p.o. without any vomiting.  Think that he is stable for discharge.  Amount and/or Complexity of Data Reviewed Labs: ordered. Radiology: ordered.  Risk Prescription drug management.          Final Clinical Impression(s) / ED Diagnoses Final diagnoses:  Nausea vomiting and diarrhea    Rx / DC Orders ED Discharge Orders          Ordered    ondansetron (ZOFRAN-ODT) 4 MG disintegrating tablet  Every 8 hours PRN        06/17/23 1436              Durwin Glaze, MD 06/17/23 1437

## 2023-06-17 NOTE — ED Notes (Signed)
 EDP at [facility]

## 2023-06-17 NOTE — Discharge Instructions (Signed)
 Please take Zofran as needed for nausea.  Take Tylenol as needed for pain.  Please follow-up with your doctor.  Return to the ER for worsening symptoms.

## 2023-06-17 NOTE — ED Triage Notes (Signed)
 Pt BIB [facility] from home with complaints of nausea, vomiting and diarrhea.  Body aches, diaphoresis, and fever at home.   Temporal temp of 100  BP 168/74, pt states he did not take meds today.  97% on room air. A&O x 4 and mobile.

## 2023-06-17 NOTE — ED Notes (Signed)
 Endorses NVD, constipation and abd pain. Last BM yesterday. No emesis in ED or for EMS.

## 2023-07-02 ENCOUNTER — Ambulatory Visit: Payer: Medicare HMO | Admitting: Nurse Practitioner

## 2023-07-02 ENCOUNTER — Encounter: Payer: Self-pay | Admitting: Nurse Practitioner

## 2023-07-09 DIAGNOSIS — F330 Major depressive disorder, recurrent, mild: Secondary | ICD-10-CM | POA: Diagnosis not present

## 2023-07-09 DIAGNOSIS — E785 Hyperlipidemia, unspecified: Secondary | ICD-10-CM | POA: Diagnosis not present

## 2023-07-09 DIAGNOSIS — D508 Other iron deficiency anemias: Secondary | ICD-10-CM | POA: Diagnosis not present

## 2023-07-09 DIAGNOSIS — I129 Hypertensive chronic kidney disease with stage 1 through stage 4 chronic kidney disease, or unspecified chronic kidney disease: Secondary | ICD-10-CM | POA: Diagnosis not present

## 2023-07-09 DIAGNOSIS — F01A0 Vascular dementia, mild, without behavioral disturbance, psychotic disturbance, mood disturbance, and anxiety: Secondary | ICD-10-CM | POA: Diagnosis not present

## 2023-07-09 DIAGNOSIS — N1831 Chronic kidney disease, stage 3a: Secondary | ICD-10-CM | POA: Diagnosis not present

## 2023-07-09 DIAGNOSIS — E11319 Type 2 diabetes mellitus with unspecified diabetic retinopathy without macular edema: Secondary | ICD-10-CM | POA: Diagnosis not present

## 2023-07-09 DIAGNOSIS — I700 Atherosclerosis of aorta: Secondary | ICD-10-CM | POA: Diagnosis not present

## 2023-07-09 DIAGNOSIS — Z0000 Encounter for general adult medical examination without abnormal findings: Secondary | ICD-10-CM | POA: Diagnosis not present

## 2023-07-09 DIAGNOSIS — E1169 Type 2 diabetes mellitus with other specified complication: Secondary | ICD-10-CM | POA: Diagnosis not present

## 2023-07-23 ENCOUNTER — Encounter: Payer: Self-pay | Admitting: Podiatry

## 2023-07-23 ENCOUNTER — Ambulatory Visit: Admitting: Podiatry

## 2023-07-23 DIAGNOSIS — M79674 Pain in right toe(s): Secondary | ICD-10-CM | POA: Diagnosis not present

## 2023-07-23 DIAGNOSIS — Z794 Long term (current) use of insulin: Secondary | ICD-10-CM

## 2023-07-23 DIAGNOSIS — E1121 Type 2 diabetes mellitus with diabetic nephropathy: Secondary | ICD-10-CM

## 2023-07-23 DIAGNOSIS — B351 Tinea unguium: Secondary | ICD-10-CM

## 2023-07-23 DIAGNOSIS — M79675 Pain in left toe(s): Secondary | ICD-10-CM

## 2023-07-23 NOTE — Progress Notes (Signed)
 This patient returns to my office for at risk foot care.  This patient requires this care by a professional since this patient will be at risk due to having type 2 diabetes.  This patient is unable to cut nails himself since the patient cannot reach his nails.These nails are painful walking and wearing shoes.  This patient presents for at risk foot care today.  General Appearance  Alert, conversant and in no acute stress.  Vascular  Dorsalis pedis and posterior tibial  pulses are palpable  bilaterally.  Capillary return is within normal limits  bilaterally. Temperature is within normal limits  bilaterally.  Neurologic  Senn-Weinstein monofilament wire test within normal limits  bilaterally. Muscle power within normal limits bilaterally.  Nails Thick disfigured discolored nails with subungual debris  from hallux to fifth toes bilaterally. No evidence of bacterial infection or drainage bilaterally.  Orthopedic  No limitations of motion  feet .  No crepitus or effusions noted.  No bony pathology or digital deformities noted.  Skin  normotropic skin with no porokeratosis noted bilaterally.  No signs of infections or ulcers noted.     Onychomycosis  Pain in right toes  Pain in left toes  Consent was obtained for treatment procedures.   Mechanical debridement of nails 1-5  bilaterally performed with a nail nipper.  Filed with dremel without incident.    Return office visit    3 months                 Told patient to return for periodic foot care and evaluation due to potential at risk complications.   Helane Gunther DPM

## 2023-09-03 DIAGNOSIS — C678 Malignant neoplasm of overlapping sites of bladder: Secondary | ICD-10-CM | POA: Diagnosis not present

## 2023-09-03 DIAGNOSIS — N3281 Overactive bladder: Secondary | ICD-10-CM | POA: Diagnosis not present

## 2023-09-03 DIAGNOSIS — R351 Nocturia: Secondary | ICD-10-CM | POA: Diagnosis not present

## 2023-09-17 ENCOUNTER — Other Ambulatory Visit: Payer: Self-pay

## 2023-09-17 ENCOUNTER — Encounter (HOSPITAL_COMMUNITY): Payer: Self-pay | Admitting: Emergency Medicine

## 2023-09-17 ENCOUNTER — Emergency Department (HOSPITAL_COMMUNITY)
Admission: EM | Admit: 2023-09-17 | Discharge: 2023-09-17 | Disposition: A | Discharge status: Home / Self Care | Attending: Emergency Medicine | Admitting: Emergency Medicine

## 2023-09-17 DIAGNOSIS — Z8546 Personal history of malignant neoplasm of prostate: Secondary | ICD-10-CM | POA: Insufficient documentation

## 2023-09-17 DIAGNOSIS — Z7982 Long term (current) use of aspirin: Secondary | ICD-10-CM | POA: Diagnosis not present

## 2023-09-17 DIAGNOSIS — T83091A Other mechanical complication of indwelling urethral catheter, initial encounter: Secondary | ICD-10-CM | POA: Insufficient documentation

## 2023-09-17 DIAGNOSIS — T839XXA Unspecified complication of genitourinary prosthetic device, implant and graft, initial encounter: Secondary | ICD-10-CM

## 2023-09-17 DIAGNOSIS — Z79899 Other long term (current) drug therapy: Secondary | ICD-10-CM | POA: Diagnosis not present

## 2023-09-17 DIAGNOSIS — Z794 Long term (current) use of insulin: Secondary | ICD-10-CM | POA: Diagnosis not present

## 2023-09-17 LAB — URINALYSIS, ROUTINE W REFLEX MICROSCOPIC
Bilirubin Urine: NEGATIVE
Glucose, UA: NEGATIVE mg/dL
Ketones, ur: 5 mg/dL — AB
Nitrite: NEGATIVE
Protein, ur: 100 mg/dL — AB
Specific Gravity, Urine: 1.018 (ref 1.005–1.030)
WBC, UA: >50 WBC/hpf (ref 0–5)
pH: 6 (ref 5.0–8.0)

## 2023-09-17 NOTE — Discharge Instructions (Signed)
 Please follow-up closely with your urologist on Monday as previously scheduled.  They will help determine what treatment to provide for you due to your recurrent urinary frequency.  Return to ER if you have any concern.

## 2023-09-17 NOTE — ED Triage Notes (Signed)
 Patient comes in pov for urinary catheter it was placed around 2 weeks ago. States he is having pain and it is leaking. Catheter was placed for urinary retention.

## 2023-09-17 NOTE — ED Provider Notes (Signed)
 [place] EMERGENCY DEPARTMENT AT [facility] Provider Note   CSN: [id number] Arrival date & time: 09/17/23  1259     History  Chief Complaint  Patient presents with   urinary catheter     Lavoris Sparling is a 83 y.o. male.  The history is provided by the patient and medical records. No language interpreter was used.     83 year old male history of prostate cancer recently had a urinary cath placed 2 weeks ago presenting with complaint of leakage around Foley.  Patient states 2 weeks ago he went to his urology office and request for a Foley placement because he does not want to get up at night to urinate multiple times a day.  Since having his Foley catheter placed, for the past few days he noticed some leakage around the Foley and irritation.  States that he would like to have his Foley catheter removed.  He does have an appointment in 5 days by his urologist to have it removed but he would like to have it removed.  Patient otherwise denies any new symptoms denies any abdominal pain or back pain fever chills nausea vomiting.  States his prostate cancer was treated 12 years ago.  Home Medications Prior to Admission medications   Medication Sig Start Date End Date Taking? Authorizing Provider  ACCU-CHEK AVIVA PLUS test strip USE AS INSTRUCTED TO CHECK BLOOD SUGAR THREE TIMES DAILY 11/11/16   Lajean Pike, MD  acetaminophen  (TYLENOL ) 325 MG tablet Take 650 mg by mouth every 6 (six) hours as needed for moderate pain.    [provider]  albuterol  (VENTOLIN  HFA) 108 (90 Base) MCG/ACT inhaler Inhale 2 puffs into the lungs every 6 (six) hours as needed for wheezing or shortness of breath. 04/19/23   Regalado, Belkys A, MD  amLODipine  (NORVASC ) 10 MG tablet Take 1 tablet (10 mg total) by mouth daily. Patient taking differently: Take 10 mg by mouth in the morning. 10/30/17   Colin Dawley, MD  aspirin  81 MG tablet Take 81 mg by mouth at bedtime.     [provider]   atorvastatin  (LIPITOR) 20 MG tablet Take 20 mg by mouth every evening. 12/16/18   [provider]  Blood Glucose Monitoring Suppl (ACCU-CHEK AVIVA PLUS) w/Device KIT Use to check blood sugar 3 times per day dx code E11.65 03/12/16   Lajean Pike, MD  carbidopa -levodopa  (SINEMET  IR) 25-100 MG tablet Take 1 pill in the morning, 1 pill in the afternoon, and 2 pills at night 09/17/22   Wess Hammed, NP  donepezil  (ARICEPT ) 10 MG tablet Take 1 tablet (10 mg total) by mouth at bedtime. 09/17/22   Wess Hammed, NP  dorzolamide -timolol  (COSOPT ) 22.3-6.8 MG/ML ophthalmic solution Place 1 drop into the right eye 2 (two) times daily.    [provider]  escitalopram  (LEXAPRO ) 10 MG tablet Take 1 tablet (10 mg total) by mouth daily. Patient taking differently: Take 10 mg by mouth at bedtime. 09/17/22   Wess Hammed, NP  ferrous sulfate  325 (65 FE) MG tablet Take 1 tablet (325 mg total) by mouth 2 (two) times daily. Patient taking differently: Take 325 mg by mouth every evening. 10/30/17   Colin Dawley, MD  fish oil-omega-3 fatty acids  1000 MG capsule Take 1 g by mouth in the morning.    [provider]  guaifenesin (ROBITUSSIN) 100 MG/5ML syrup Take 200 mg by mouth 3 (three) times daily as needed for cough.    [provider]  Insulin  Syringe-Needle U-100 (INSULIN  SYRINGE .5CC/31GX5/16") 31G X 5/16" 0.5 ML MISC Use one to inject insulin  daily 05/02/15   Lajean Pike, MD  latanoprost  (XALATAN ) 0.005 % ophthalmic solution Place 1 drop into the left eye at bedtime. 02/17/14   [provider]  loperamide (IMODIUM A-D) 2 MG tablet Take 2 mg by mouth 4 (four) times daily as needed for diarrhea or loose stools.    [provider]  meclizine  (ANTIVERT ) 25 MG tablet Take 1 tablet (25 mg total) by mouth 3 (three) times daily as needed for dizziness. 07/05/16   Alissa April, MD  memantine  (NAMENDA ) 10 MG tablet Take 1 tablet (10 mg total) by mouth 2 (two) times daily.  09/17/22   Wess Hammed, NP  metoprolol  tartrate (LOPRESSOR ) 50 MG tablet Take 50 mg by mouth 2 (two) times daily.    [provider]  Multiple Vitamins-Minerals (CENTRUM SILVER ADULT 50+) TABS Take 1 tablet by mouth in the morning.    [provider]  Multiple Vitamins-Minerals (PRESERVISION AREDS) TABS Take 1 capsule by mouth in the morning.    [provider]  NOVOLIN 70/30 (70-30) 100 UNIT/ML injection Inject 5-12 Units into the skin 3 (three) times daily as needed (high blood sugar). 05/16/19   [provider]  ondansetron  (ZOFRAN -ODT) 4 MG disintegrating tablet Take 1 tablet (4 mg total) by mouth every 8 (eight) hours as needed for nausea or vomiting. 06/17/23   Carin Charleston, MD  ondansetron  (ZOFRAN -ODT) 4 MG disintegrating tablet Take 1 tablet (4 mg total) by mouth every 8 (eight) hours as needed for nausea or vomiting. 06/17/23   Carin Charleston, MD  predniSONE  (DELTASONE ) 10 MG tablet Take by mouth. 04/29/23   [provider]  tamsulosin  (FLOMAX ) 0.4 MG CAPS capsule Take 1 capsule (0.4 mg total) by mouth at bedtime. 10/30/17   Colin Dawley, MD  Tiotropium Bromide-Olodaterol (STIOLTO RESPIMAT ) 2.5-2.5 MCG/ACT AERS Inhale 2 puffs into the lungs daily. 05/20/23   Cobb, Mariah Shines, NP  tiZANidine (ZANAFLEX) 2 MG tablet Take 2 mg by mouth at bedtime as needed. 04/29/23   [provider]      Allergies    Ace inhibitors and Allopurinol    Review of Systems   Review of Systems  Constitutional:  Negative for fever.  Genitourinary:  Negative for penile pain and urgency.    Physical Exam Updated Vital Signs BP (!) 151/65   Pulse 65   Temp 98 F (36.7 C) (Oral)   Resp 16   Ht 5\' 6"  (1.676 m)   Wt 86.2 kg   SpO2 97%   BMI 30.67 kg/m  Physical Exam Constitutional:      General: He is not in acute distress.    Appearance: He is well-developed.  HENT:     Head: Atraumatic.  Eyes:     Conjunctiva/sclera: Conjunctivae normal.   Abdominal:     Palpations: Abdomen is soft.     Tenderness: There is no abdominal tenderness.  Genitourinary:    Comments: Chaperone present during exam.  Foley catheter is in place.  No rash or signs of trauma.  Foley catheter removed without difficulty. Musculoskeletal:     Cervical back: Normal range of motion and neck supple.  Skin:    Findings: No rash.  Neurological:     Mental Status: He is alert.     ED Results / Procedures / Treatments   Labs (all labs ordered are listed, but only abnormal results are displayed)  Labs Reviewed - No data to display  EKG None  Radiology No results found.  Procedures Procedures    Medications Ordered in ED Medications - No data to display  ED Course/ Medical Decision Making/ A&P                                 Medical Decision Making Amount and/or Complexity of Data Reviewed Labs: ordered.   BP (!) 151/65   Pulse 65   Temp 98 F (36.7 C) (Oral)   Resp 16   Ht 5\' 6"  (1.676 m)   Wt 86.2 kg   SpO2 97%   BMI 30.67 kg/m   48:53 PM 83 year old male history of prostate cancer recently had a urinary cath placed 2 weeks ago presenting with complaint of leakage around Foley.  Patient states 2 weeks ago he went to his urology office and request for a Foley placement because he does not want to get up at night to urinate multiple times a day.  Since having his Foley catheter placed, for the past few days he noticed some leakage around the Foley and irritation.  States that he would like to have his Foley catheter removed.  He does have an appointment in 5 days by his urologist to have it removed but he would like to have it removed.  Patient otherwise denies any new symptoms denies any abdominal pain or back pain fever chills nausea vomiting.  States his prostate cancer was treated 12 years ago.  On exam Foley appears to be in normal function ordered however due to patient's request, Foley was removed.  Subsequently patient was able  to urinate on his own without any complaint.  I did reach out to urology team in regards to further treatment.  Encourage patient to follow-up with his urologist on Monday for further evaluation.  Patient may benefit from TP catheter          Final Clinical Impression(s) / ED Diagnoses Final diagnoses:  Problem with Foley catheter, initial encounter [facility])    Rx / DC Orders ED Discharge Orders     None         Debbra Fairy, PA-C 09/17/23 1543    Sallyanne Creamer, DO 09/22/23 2007

## 2023-09-21 DIAGNOSIS — N401 Enlarged prostate with lower urinary tract symptoms: Secondary | ICD-10-CM | POA: Diagnosis not present

## 2023-09-21 DIAGNOSIS — C61 Malignant neoplasm of prostate: Secondary | ICD-10-CM | POA: Diagnosis not present

## 2023-09-21 DIAGNOSIS — R351 Nocturia: Secondary | ICD-10-CM | POA: Diagnosis not present

## 2023-09-21 DIAGNOSIS — R300 Dysuria: Secondary | ICD-10-CM | POA: Diagnosis not present

## 2023-09-21 DIAGNOSIS — N3281 Overactive bladder: Secondary | ICD-10-CM | POA: Diagnosis not present

## 2023-10-21 DIAGNOSIS — N189 Chronic kidney disease, unspecified: Secondary | ICD-10-CM | POA: Diagnosis not present

## 2023-10-21 DIAGNOSIS — E1165 Type 2 diabetes mellitus with hyperglycemia: Secondary | ICD-10-CM | POA: Diagnosis not present

## 2023-10-21 DIAGNOSIS — I10 Essential (primary) hypertension: Secondary | ICD-10-CM | POA: Diagnosis not present

## 2023-10-21 DIAGNOSIS — E7800 Pure hypercholesterolemia, unspecified: Secondary | ICD-10-CM | POA: Diagnosis not present

## 2023-11-05 DIAGNOSIS — E1165 Type 2 diabetes mellitus with hyperglycemia: Secondary | ICD-10-CM | POA: Diagnosis not present

## 2023-11-17 ENCOUNTER — Other Ambulatory Visit: Payer: Self-pay | Admitting: Neurology

## 2023-11-23 ENCOUNTER — Telehealth: Payer: Self-pay | Admitting: Neurology

## 2023-11-23 MED ORDER — CARBIDOPA-LEVODOPA 25-100 MG PO TABS: ORAL_TABLET | ORAL | 0 refills | Status: DC

## 2023-11-23 NOTE — Telephone Encounter (Signed)
 Week sent

## 2023-11-23 NOTE — Telephone Encounter (Signed)
 Patient's daughter, Lonell Sharps said patient is out of carbidopa -levodopa  (SINEMET  IR) 25-100 MG tablet. Have receved from the mail order pharmacy. Could Lauraine Born send in a week supply of the medication. That would help until mail order arrives. He is out of medication. Send to [facility] [phone]

## 2023-11-24 ENCOUNTER — Ambulatory Visit: Admitting: Neurology

## 2023-11-24 ENCOUNTER — Encounter: Payer: Self-pay | Admitting: Neurology

## 2023-11-24 VITALS — BP 127/61 | HR 63 | Ht 68.0 in | Wt 188.5 lb

## 2023-11-24 DIAGNOSIS — F03B18 Unspecified dementia, moderate, with other behavioral disturbance: Secondary | ICD-10-CM

## 2023-11-24 DIAGNOSIS — G218 Other secondary parkinsonism: Secondary | ICD-10-CM

## 2023-11-24 DIAGNOSIS — G4733 Obstructive sleep apnea (adult) (pediatric): Secondary | ICD-10-CM

## 2023-11-24 DIAGNOSIS — F01B0 Vascular dementia, moderate, without behavioral disturbance, psychotic disturbance, mood disturbance, and anxiety: Secondary | ICD-10-CM | POA: Diagnosis not present

## 2023-11-24 DIAGNOSIS — Z862 Personal history of diseases of the blood and blood-forming organs and certain disorders involving the immune mechanism: Secondary | ICD-10-CM | POA: Insufficient documentation

## 2023-11-24 DIAGNOSIS — N1831 Chronic kidney disease, stage 3a: Secondary | ICD-10-CM | POA: Insufficient documentation

## 2023-11-24 DIAGNOSIS — J439 Emphysema, unspecified: Secondary | ICD-10-CM | POA: Insufficient documentation

## 2023-11-24 DIAGNOSIS — I129 Hypertensive chronic kidney disease with stage 1 through stage 4 chronic kidney disease, or unspecified chronic kidney disease: Secondary | ICD-10-CM | POA: Insufficient documentation

## 2023-11-24 DIAGNOSIS — G214 Vascular parkinsonism: Secondary | ICD-10-CM | POA: Insufficient documentation

## 2023-11-24 DIAGNOSIS — G4730 Sleep apnea, unspecified: Secondary | ICD-10-CM | POA: Insufficient documentation

## 2023-11-24 DIAGNOSIS — F01A0 Vascular dementia, mild, without behavioral disturbance, psychotic disturbance, mood disturbance, and anxiety: Secondary | ICD-10-CM | POA: Insufficient documentation

## 2023-11-24 DIAGNOSIS — E113299 Type 2 diabetes mellitus with mild nonproliferative diabetic retinopathy without macular edema, unspecified eye: Secondary | ICD-10-CM | POA: Insufficient documentation

## 2023-11-24 MED ORDER — MEMANTINE HCL 10 MG PO TABS: 10.0000 mg | ORAL_TABLET | Freq: Two times a day (BID) | ORAL | 3 refills | Status: AC

## 2023-11-24 MED ORDER — ESCITALOPRAM OXALATE 10 MG PO TABS: 10.0000 mg | ORAL_TABLET | Freq: Every day | ORAL | 3 refills | Status: AC

## 2023-11-24 MED ORDER — CARBIDOPA-LEVODOPA 25-100 MG PO TABS: ORAL_TABLET | ORAL | 3 refills | Status: AC

## 2023-11-24 MED ORDER — DONEPEZIL HCL 10 MG PO TABS: 10.0000 mg | ORAL_TABLET | Freq: Every day | ORAL | 3 refills | Status: AC

## 2023-11-24 NOTE — Progress Notes (Signed)
 Patient: [name] Date of Birth: October 04, 1940  Reason for Visit: Follow up History from: Patient, wife Primary Neurologist: Chima/Dohmeier   ASSESSMENT AND PLAN 83 y.o. year old male   1.  Dementia 2.  Parkinsonism with tremor, gait difficulty 3.  OSA 4.  Depression  - Decline in memory MMSE 13/30 (18/30), no real change reported from his wife or daughter, however he is very sedentary, not much interest in activity - I can see mild resting tremor to the left hand, mild head tremor. He has actually been out of Sinemet  for 2 days, daughter reports tremor is worse for this reason  - Adamant he will not use CPAP despite reviewing PSG in 2023 showing severe apnea and hypoxia, likely part of the reason he is tired during the day and sedentary  - We will continue his current medications including Aricept  10 mg daily, Namenda  10 mg twice a day, Lexapro  10 mg daily, Sinemet  25/100 mg 1/1/2 - I would like for him to see Dr. Chalice in 6 months to have her evaluate his parkinsonism, I see very minimal symptoms on exam today and he has been without Sinemet  for 2 days   Meds ordered this encounter  Medications   carbidopa -levodopa  (SINEMET  IR) 25-100 MG tablet    Sig: TAKE 1 TABLET IN THE MORNING, 1 TABLET IN THE AFTERNOON, AND 2 TABLETS AT NIGHT    Dispense:  360 tablet    Refill:  3   donepezil  (ARICEPT ) 10 MG tablet    Sig: Take 1 tablet (10 mg total) by mouth at bedtime.    Dispense:  90 tablet    Refill:  3   memantine  (NAMENDA ) 10 MG tablet    Sig: Take 1 tablet (10 mg total) by mouth 2 (two) times daily.    Dispense:  180 tablet    Refill:  3   escitalopram  (LEXAPRO ) 10 MG tablet    Sig: Take 1 tablet (10 mg total) by mouth daily.    Dispense:  90 tablet    Refill:  3   HISTORY OF PRESENT ILLNESS: Today 11/24/23 Here with his wife, feels memory is stable, wife tells me he doesn't help her with anything. Spends most of the day sitting in his chair. Doesn't drive. Uses cane,  is sedentary, stays in the house all the time, per wife. Eats good, sleeps well. Daughter manages medication. He is not using CPAP machine, he doesn't want to. Tremor stable, mildly in his head I see, mild in left hand at rest. Had issue with urinary retention in May, didn't work for him.  We called his daughter Lonell, she has no concerns. MMSE 13/30.   05/19/23 SS: MMSE 18/30 today. In the hospital Dec for COVID PNA. Memory is about the same,  he  thinks doing great for his age. Does his own ADLs. Uses walker, no falls. Tremor is hands hasn't been bothersome. Good appetite. Daughter manages medications, they aren't sure but think medication list is accurate, I called her and left a message to verify.  Per medication list on Sinemet  25/100 mg 1 tablet twice daily, 2 tablets at bedtime, Aricept  10 mg daily, Namenda  10 mg twice daily.  Doesn't like his CPAP, when he wears it, he doesn't snore, wife knows he sleeps better. Is sedentary. He doesn't drive. Supposed to be doing his own physical therapy. He does what he wants to do. When he came home from hospital was on oxygen, he took himself off it.  Had issues with CPAP, reports took to DME and they reset it.  90-day download 02/18/23-05/18/23 shows 66% use, > 4 hours 33%, average usage days used 4 hours 4 minutes. 6-12 cm.  AHI 3.1, leak 33.  Update 09/17/22 SS: At last visit in November 2023 with Dr. Rush, Sinemet  was increased 25/100 mg to 1 tablet AM and Noon, 2 pills PM.  Continue Aricept  10 mg, Namenda  10 mg twice daily, Lexapro  10 mg daily. He is not using his CPAP, he doesn't want to use it. He wife is witnessing apneas, he wakes himself up coughing. Tremor is better. Daughter manages medications. He isn't active, watches TV and sleeps. Uses walker, no falls. He doesn't want to get up and move around much, He was hospitalized April 2024 for hypoxia. MMSE 15/30 today. Apparently briefly started on oxygen but told not needed, so returned it. PT ordered but  hasn't been started. Doesn't feel unsteady. He feels like mood is good. His wife is frustrated.   HISTORY  03/20/22 Dr. Rush Brief HPI: 83 year old male with a history of OSA, HTN, HLD, diabetes who follows in clinic for memory loss.    At his last visit, Lexapro  was increased to 10 mg daily. He was started on Sinemet  trial for tremor and bradykinesia.   Interval History: PSG revealed OSA and he was started on CPAP. This has improved his daytime somnolence. Feels his memory has improved sightly since starting CPAP. Otherwise feels stable with no new concerns today. He is tolerating Namenda  10 mg BID and donepezil  10 mg daily without issues.   Feels his gait and balance have improved slightly. Tremor has improved though he will still notice it occasionally. Tolerates Sinemet  well without issues.   Mood is stable on Lexapro  10 mg daily  REVIEW OF SYSTEMS: Out of a complete 14 system review of symptoms, the patient complains only of the following symptoms, and all other reviewed systems are negative.  See HPI  ALLERGIES: Allergies  Allergen Reactions   Ace Inhibitors Swelling    Benazepril   Allopurinol Rash    HOME MEDICATIONS: Outpatient Medications Prior to Visit  Medication Sig Dispense Refill   ACCU-CHEK AVIVA PLUS test strip USE AS INSTRUCTED TO CHECK BLOOD SUGAR THREE TIMES DAILY 300 each 1   albuterol  (VENTOLIN  HFA) 108 (90 Base) MCG/ACT inhaler Inhale 2 puffs into the lungs every 6 (six) hours as needed for wheezing or shortness of breath. 17 each 2   amLODipine  (NORVASC ) 10 MG tablet Take 1 tablet (10 mg total) by mouth daily. (Patient taking differently: Take 10 mg by mouth in the morning.) 30 tablet 1   aspirin  81 MG tablet Take 81 mg by mouth at bedtime.      atorvastatin  (LIPITOR) 20 MG tablet Take 20 mg by mouth every evening.     Blood Glucose Monitoring Suppl (ACCU-CHEK AVIVA PLUS) w/Device KIT Use to check blood sugar 3 times per day dx code E11.65 1 kit 0    dorzolamide -timolol  (COSOPT ) 22.3-6.8 MG/ML ophthalmic solution Place 1 drop into the right eye 2 (two) times daily.     escitalopram  (LEXAPRO ) 10 MG tablet Take 1 tablet (10 mg total) by mouth daily. (Patient taking differently: Take 10 mg by mouth at bedtime.) 90 tablet 3   ferrous sulfate  325 (65 FE) MG tablet Take 1 tablet (325 mg total) by mouth 2 (two) times daily. (Patient taking differently: Take 325 mg by mouth every evening.) 60 tablet 3   fish oil-omega-3  fatty acids 1000 MG capsule Take 1 g by mouth in the morning.     guaifenesin (ROBITUSSIN) 100 MG/5ML syrup Take 200 mg by mouth 3 (three) times daily as needed for cough.     Insulin  Syringe-Needle U-100 (INSULIN  SYRINGE .5CC/31GX5/16) 31G X 5/16 0.5 ML MISC Use one to inject insulin  daily 30 each 1   latanoprost  (XALATAN ) 0.005 % ophthalmic solution Place 1 drop into the left eye at bedtime.     loperamide (IMODIUM A-D) 2 MG tablet Take 2 mg by mouth 4 (four) times daily as needed for diarrhea or loose stools.     meclizine  (ANTIVERT ) 25 MG tablet Take 1 tablet (25 mg total) by mouth 3 (three) times daily as needed for dizziness. 30 tablet 0   metoprolol  tartrate (LOPRESSOR ) 50 MG tablet Take 50 mg by mouth 2 (two) times daily.     Multiple Vitamins-Minerals (CENTRUM SILVER ADULT 50+) TABS Take 1 tablet by mouth in the morning.     Multiple Vitamins-Minerals (PRESERVISION AREDS) TABS Take 1 capsule by mouth in the morning.     NOVOLIN 70/30 (70-30) 100 UNIT/ML injection Inject 5-12 Units into the skin 3 (three) times daily as needed (high blood sugar).     ondansetron  (ZOFRAN -ODT) 4 MG disintegrating tablet Take 1 tablet (4 mg total) by mouth every 8 (eight) hours as needed for nausea or vomiting. 20 tablet 0   ondansetron  (ZOFRAN -ODT) 4 MG disintegrating tablet Take 1 tablet (4 mg total) by mouth every 8 (eight) hours as needed for nausea or vomiting. 20 tablet 0   predniSONE  (DELTASONE ) 10 MG tablet Take by mouth.     tamsulosin   (FLOMAX ) 0.4 MG CAPS capsule Take 1 capsule (0.4 mg total) by mouth at bedtime. 30 capsule 1   Tiotropium Bromide-Olodaterol (STIOLTO RESPIMAT ) 2.5-2.5 MCG/ACT AERS Inhale 2 puffs into the lungs daily. 4 g 5   tiZANidine (ZANAFLEX) 2 MG tablet Take 2 mg by mouth at bedtime as needed.     carbidopa -levodopa  (SINEMET  IR) 25-100 MG tablet TAKE 1 TABLET IN THE MORNING, 1 TABLET IN THE AFTERNOON, AND 2 TABLETS AT NIGHT 28 tablet 0   donepezil  (ARICEPT ) 10 MG tablet Take 1 tablet (10 mg total) by mouth at bedtime. 90 tablet 3   memantine  (NAMENDA ) 10 MG tablet TAKE 1 TABLET TWICE DAILY 180 tablet 3   acetaminophen  (TYLENOL ) 325 MG tablet Take 650 mg by mouth every 6 (six) hours as needed for moderate pain. (Patient not taking: Reported on 11/24/2023)     No facility-administered medications prior to visit.    PAST MEDICAL HISTORY: Past Medical History:  Diagnosis Date   Arthritis    Bladder cancer (HCC)    in situ   Cataracts, bilateral    CHF (congestive heart failure) (HCC)    Chronic kidney disease    COPD (chronic obstructive pulmonary disease) (HCC)    Dementia (HCC)    Diabetes mellitus    insulin  dependent   Glaucoma    Gout    Hearing loss    Heart disease, unspecified    Hypercholesterolemia    Hypertension    Memory changes    Neuromuscular disorder (HCC)    parkinson's   Prostate cancer (HCC)    s/p radiation   Sarcoidosis    Stroke (HCC)    Unspecified glaucoma(365.9)     PAST SURGICAL HISTORY: Past Surgical History:  Procedure Laterality Date   BLADDER SURGERY     for carcinoma   CATARACT EXTRACTION Bilateral  2011   CIRCUMCISION N/A 10/29/2022   Procedure: CIRCUMCISION ADULT;  Surgeon: Cam Morene ORN, MD;  Location: [facility];  Service: Urology;  Laterality: N/A;  60 MINUTES   EYE SURGERY Bilateral    pressure surgery related to glaucoma   knee replacement surgery  1999   right   PROSTATECTOMY  2013    FAMILY HISTORY: Family History  Problem Relation Age  of Onset   Hypertension Mother    Diabetes Mellitus I Mother    Hypertension Father    Coronary artery disease Father    Hypertension Sister    Hypertension Brother    Coronary artery disease Brother    Diabetes Mellitus I Brother    Diabetes Mellitus I Brother    Cancer Neg Hx     SOCIAL HISTORY: Social History   Socioeconomic History   Marital status: Married    Spouse name: Slater   Number of children: 3   Years of education: 9   Highest education level: 9th grade  Occupational History    Comment: retired Radio broadcast assistant  Tobacco Use   Smoking status: Former    Current packs/day: 0.00    Types: Cigarettes    Quit date: 04/21/2005    Years since quitting: 18.6   Smokeless tobacco: Never  Vaping Use   Vaping status: Never Used  Substance and Sexual Activity   Alcohol use: No   Drug use: No   Sexual activity: Not Currently  Other Topics Concern   Not on file  Social History Narrative   06/05/21 Lives with wife   Right handed   Caffeine: 1 C of coffee a day   Social Drivers of Corporate investment banker Strain: Not on file  Food Insecurity: No Food Insecurity (04/19/2023)   Hunger Vital Sign    Worried About Running Out of Food in the Last Year: Never true    Ran Out of Food in the Last Year: Never true  Transportation Needs: No Transportation Needs (04/19/2023)   PRAPARE - Administrator, Civil Service (Medical): No    Lack of Transportation (Non-Medical): No  Physical Activity: Not on file  Stress: Not on file  Social Connections: Not on file  Intimate Partner Violence: Not At Risk (04/19/2023)   Humiliation, Afraid, Rape, and Kick questionnaire    Fear of Current or Ex-Partner: No    Emotionally Abused: No    Physically Abused: No    Sexually Abused: No   PHYSICAL EXAM  Vitals:   11/24/23 0956  BP: 127/61  Pulse: 63  SpO2: 96%  Weight: 188 lb 8 oz (85.5 kg)  Height: 5' 8 (1.727 m)     Body mass index is 28.66 kg/m.    11/24/2023    10:16 AM 05/19/2023    9:28 AM 09/17/2022   12:54 PM  MMSE - Mini Mental State Exam  Orientation to time 2 4 2   Orientation to Place 5 3 4   Registration 0 3 3  Attention/ Calculation 0 0 0  Recall 1 2 0  Language- name 2 objects 2 2 2   Language- repeat 0 1 1  Language- follow 3 step command 2 3 3   Language- read & follow direction 0 0 0  Language-read & follow direction-comments   unable to see  Write a sentence 0 0 0  Copy design 1 0 0  Total score 13 18 15    Generalized: Well developed, in no acute distress, elderly male Neurological examination  Mentation: Alert, is quiet, wife provides most history.  He seems somewhat disinterested, has no complaints.  Follows all commands speech and language fluent. His wife is frustrated with him.  Cranial nerve II-XII: Pupils were equal round reactive to light. Extraocular movements were full, visual field were full on confrontational test. Facial sensation and strength were normal.  Head turning and shoulder shrug  were normal and symmetric. Motor: The motor testing reveals 5 over 5 strength of all 4 extremities. Good symmetric motor tone is noted throughout.  Mild rigidity, bradykinesia to the left upper extremity.  Mild head tremor noted, mild left hand resting tremor noted. Sensory: Sensory testing is intact to soft touch on all 4 extremities. No evidence of extinction is noted.  Coordination: Cerebellar testing reveals good finger-nose-finger and heel-to-shin bilaterally.   Gait and station: Has to push off from seated position, gait is wide-based, cautious, stooped posture, has single-point cane in the hallway Reflexes: Deep tendon reflexes are symmetric but decreased  DIAGNOSTIC DATA (LABS, IMAGING, TESTING) - I reviewed patient records, labs, notes, testing and imaging myself where available.  Lab Results  Component Value Date   WBC 5.7 06/17/2023   HGB 12.1 (L) 06/17/2023   HCT 38.3 (L) 06/17/2023   MCV 77.7 (L) 06/17/2023   PLT 123  (L) 06/17/2023      Component Value Date/Time   NA 142 06/17/2023 1045   K 3.5 06/17/2023 1045   CL 107 06/17/2023 1045   CO2 25 06/17/2023 1045   GLUCOSE 160 (H) 06/17/2023 1045   BUN 23 06/17/2023 1045   CREATININE 1.27 (H) 06/17/2023 1045   CALCIUM  8.9 06/17/2023 1045   PROT 7.2 06/17/2023 1045   ALBUMIN 3.3 (L) 06/17/2023 1045   AST 14 (L) 06/17/2023 1045   ALT 6 06/17/2023 1045   ALKPHOS 68 06/17/2023 1045   BILITOT 0.8 06/17/2023 1045   GFRNONAA 57 (L) 06/17/2023 1045   GFRAA >60 10/30/2017 0437   Lab Results  Component Value Date   CHOL 96 07/23/2022   HDL 30 (L) 07/23/2022   LDLCALC 54 07/23/2022   TRIG 59 07/23/2022   CHOLHDL 3.2 07/23/2022   Lab Results  Component Value Date   HGBA1C 8.3 (H) 04/16/2023   Lab Results  Component Value Date   VITAMINB12 821 06/05/2021   Lab Results  Component Value Date   TSH 4.078 07/23/2022    Lauraine Born, AGNP-C, DNP 11/24/2023, 10:46 AM [facility] [address] [place], [place] [address] [phone]

## 2023-11-24 NOTE — Patient Instructions (Signed)
 Recommend getting back on CPAP, if any questions let me know We can continue current medications Try to increase exercise, activity  Follow up in 6 months with Dr. Chalice

## 2023-11-25 NOTE — Progress Notes (Signed)
 Patient has been diagnosed with vascular dementia, neurocognitive impairment is now severe, his parkinsonism has been treated  for 24 months or longer on carbidopa  levodopa .

## 2023-12-01 ENCOUNTER — Ambulatory Visit: Payer: Medicare HMO | Admitting: Neurology

## 2023-12-13 NOTE — Progress Notes (Unsigned)
 Cardiology Office Note    Date:  12/16/2023  ID:  [name], DOB 01-22-1941, MRN [id number] PCP:  Teresa Channel, MD  Cardiologist:  Oneil Parchment, MD  Electrophysiologist:  None   Chief Complaint: Follow up for hypertension   History of Present Illness: .    [name] is a 83 y.o. male with visit-pertinent history of diabetes, hypertension, chronic kidney disease stage II, iron deficiency anemia, OSA, parkinsons and per notes has history of MI on prior medical records although patient has no recollection of cardiac catheterization or stent placement. Patient has history of angioedema on ACE inhibitor.   Patient was last seen in clinic on 08/08/21 by Dr. Parchment.  It was noted patient had been doing very well.  He was ambulating with a cane.  Echocardiogram on 4//24 indicated LVEF 50 to 55%, no RWMA, mild concentric LVH, G1 DD, RV systolic function and size was normal, normal PASP, LA was severely dilated, no evidence of mitral valve regurgitation or stenosis aortic valve regurgitation was mild with no evidence of stenosis.  Today he presents for follow-up.  He reports that he has been doing well overall. He presents today with his wife who assist with history. He denies chest pain, shorntess of breath, lower extremity edmea, orthopnea or pnd. He denies any dizziness, lightheadedness, presyncope or syncope.  His EKG today indicated sinus bradycardia at 50 bpm, on chart review his metoprolol  was increased to 50 mg daily, patient and his wife are unsure as to why, he denies any significant fatigue or other symptoms as previously noted.  Patient's walked around in the room with appropriate increases in heart rate in the low 70s. ROS: .   Today he denies chest pain, shortness of breath, lower extremity edema, fatigue, palpitations, melena, hematuria, hemoptysis, diaphoresis, weakness, presyncope, syncope, orthopnea, and PND.  All other systems are reviewed and otherwise negative. Studies  Reviewed: SABRA   EKG:  EKG is ordered today, personally reviewed, demonstrating  EKG Interpretation Date/Time:  Wednesday December 16 2023 10:22:59 EDT Ventricular Rate:  50 PR Interval:  154 QRS Duration:  76 QT Interval:  458 QTC Calculation: 417 R Axis:   -20  Text Interpretation: Sinus bradycardia with sinus arrhythmia Nonspecific T wave abnormality Confirmed by Phyllip Claw [phone]) on 12/16/2023 1:20:50 PM   CV Studies: Cardiac studies reviewed are outlined and summarized above. Otherwise please see EMR for full report. Cardiac Studies & Procedures   ______________________________________________________________________________________________     ECHOCARDIOGRAM  ECHOCARDIOGRAM COMPLETE 07/24/2022  Narrative ECHOCARDIOGRAM REPORT    Patient Name:   [name] Date of Exam: 07/24/2022 Medical Rec #:  [id number]     Height:       72.0 in Accession #:    [id number]    Weight:       188.3 lb Date of Birth:  May 30, 1940      BSA:          2.077 m Patient Age:    81 years      BP:           113/53 mmHg Patient Gender: M             HR:           69 bpm. Exam Location:  Inpatient  Procedure: 2D Echo, Color Doppler, Cardiac Doppler and 3D Echo  Indications:    Congestive Heart Failure I50.9  History:        Patient has no prior history of Echocardiogram examinations. Cancer, Signs/Symptoms:Altered  Mental Status; Risk Factors:Diabetes, Dyslipidemia, Sleep Apnea and Hypertension.  Sonographer:    Madeline Finder Referring Phys: [id number] RAMESH KC   Sonographer Comments: Image acquisition challenging due to respiratory motion. IMPRESSIONS   1. Left ventricular ejection fraction, by estimation, is 50 to 55%. Left ventricular ejection fraction by 3D volume is 51 %. The left ventricle has low normal function. The left ventricle has no regional wall motion abnormalities. There is mild concentric left ventricular hypertrophy. Left ventricular diastolic parameters are consistent with Grade I  diastolic dysfunction (impaired relaxation). 2. Right ventricular systolic function is normal. The right ventricular size is normal. There is normal pulmonary artery systolic pressure. 3. Left atrial size was severely dilated. 4. The mitral valve is myxomatous. No evidence of mitral valve regurgitation. No evidence of mitral stenosis. 5. The aortic valve is tricuspid. There is mild calcification of the aortic valve. There is mild thickening of the aortic valve. Aortic valve regurgitation is mild. No aortic stenosis is present. 6. The inferior vena cava is normal in size with greater than 50% respiratory variability, suggesting right atrial pressure of 3 mmHg.  FINDINGS Left Ventricle: Left ventricular ejection fraction, by estimation, is 50 to 55%. Left ventricular ejection fraction by 3D volume is 51 %. The left ventricle has low normal function. The left ventricle has no regional wall motion abnormalities. The left ventricular internal cavity size was normal in size. There is mild concentric left ventricular hypertrophy. Left ventricular diastolic parameters are consistent with Grade I diastolic dysfunction (impaired relaxation).  Right Ventricle: The right ventricular size is normal. No increase in right ventricular wall thickness. Right ventricular systolic function is normal. There is normal pulmonary artery systolic pressure. The tricuspid regurgitant velocity is 2.50 m/s, and with an assumed right atrial pressure of 3 mmHg, the estimated right ventricular systolic pressure is 28.0 mmHg.  Left Atrium: Left atrial size was severely dilated.  Right Atrium: Right atrial size was normal in size.  Pericardium: There is no evidence of pericardial effusion.  Mitral Valve: The mitral valve is myxomatous. No evidence of mitral valve regurgitation. No evidence of mitral valve stenosis.  Tricuspid Valve: The tricuspid valve is normal in structure. Tricuspid valve regurgitation is trivial. No  evidence of tricuspid stenosis.  Aortic Valve: The aortic valve is tricuspid. There is mild calcification of the aortic valve. There is mild thickening of the aortic valve. Aortic valve regurgitation is mild. Aortic regurgitation PHT measures 539 msec. No aortic stenosis is present.  Pulmonic Valve: The pulmonic valve was normal in structure. Pulmonic valve regurgitation is mild. No evidence of pulmonic stenosis.  Aorta: The aortic root is normal in size and structure.  Venous: The inferior vena cava is normal in size with greater than 50% respiratory variability, suggesting right atrial pressure of 3 mmHg.  IAS/Shunts: No atrial level shunt detected by color flow Doppler.   LEFT VENTRICLE PLAX 2D LVIDd:         3.40 cm         Diastology LVIDs:         2.50 cm         LV e' medial:  6.64 cm/s LV PW:         1.04 cm         LV e' lateral: 10.70 cm/s LV IVS:        1.20 cm LVOT diam:     2.40 cm LV SV:         89  3D Volume EF LV SV Index:   43              LV 3D EF:    Left LVOT Area:     4.52 cm                     ventricul ar ejection fraction by 3D volume is 51 %.  3D Volume EF: 3D EF:        51 % LV EDV:       137 ml LV ESV:       67 ml LV SV:        69 ml  RIGHT VENTRICLE RV S prime:     14.70 cm/s TAPSE (M-mode): 1.9 cm  LEFT ATRIUM           Index        RIGHT ATRIUM           Index LA diam:      3.30 cm 1.59 cm/m   RA Area:     18.60 cm LA Vol (A2C): 74.8 ml 36.02 ml/m  RA Volume:   55.40 ml  26.68 ml/m LA Vol (A4C): 88.5 ml 42.62 ml/m AORTIC VALVE             PULMONIC VALVE LVOT Vmax:   64.70 cm/s  PR End Diast Vel: 10.37 msec LVOT Vmean:  49.600 cm/s LVOT VTI:    0.196 m AI PHT:      539 msec  AORTA Ao Root diam: 3.80 cm Ao Asc diam:  3.50 cm  MITRAL VALVE               TRICUSPID VALVE MV Area (PHT): 3.27 cm    TR Peak grad:   25.0 mmHg MV Decel Time: 232 msec    TR Vmax:        250.00 cm/s MR Peak grad: 0.9 mmHg MR Vmax:       48.60 cm/s   SHUNTS MV A velocity: 83.70 cm/s  Systemic VTI:  0.20 m Systemic Diam: 2.40 cm  Annabella Scarce MD Electronically signed by Annabella Scarce MD Signature Date/Time: 07/25/2022/7:45:18 AM    Final          ______________________________________________________________________________________________       Current Reported Medications:.    Current Meds  Medication Sig   ACCU-CHEK AVIVA PLUS test strip USE AS INSTRUCTED TO CHECK BLOOD SUGAR THREE TIMES DAILY   albuterol  (VENTOLIN  HFA) 108 (90 Base) MCG/ACT inhaler Inhale 2 puffs into the lungs every 6 (six) hours as needed for wheezing or shortness of breath.   amLODipine  (NORVASC ) 10 MG tablet Take 1 tablet (10 mg total) by mouth daily.   aspirin  81 MG tablet Take 81 mg by mouth at bedtime.    atorvastatin  (LIPITOR) 20 MG tablet Take 20 mg by mouth every evening.   Blood Glucose Monitoring Suppl (ACCU-CHEK AVIVA PLUS) w/Device KIT Use to check blood sugar 3 times per day dx code E11.65   carbidopa -levodopa  (SINEMET  IR) 25-100 MG tablet TAKE 1 TABLET IN THE MORNING, 1 TABLET IN THE AFTERNOON, AND 2 TABLETS AT NIGHT   donepezil  (ARICEPT ) 10 MG tablet Take 1 tablet (10 mg total) by mouth at bedtime.   dorzolamide -timolol  (COSOPT ) 22.3-6.8 MG/ML ophthalmic solution Place 1 drop into the right eye 2 (two) times daily.   escitalopram  (LEXAPRO ) 10 MG tablet Take 1 tablet (10 mg total) by mouth daily.   ferrous sulfate  325 (65 FE) MG tablet Take  1 tablet (325 mg total) by mouth 2 (two) times daily.   fish oil-omega-3 fatty acids  1000 MG capsule Take 1 g by mouth in the morning.   guaifenesin (ROBITUSSIN) 100 MG/5ML syrup Take 200 mg by mouth 3 (three) times daily as needed for cough.   Insulin  Syringe-Needle U-100 (INSULIN  SYRINGE .5CC/31GX5/16) 31G X 5/16 0.5 ML MISC Use one to inject insulin  daily   latanoprost  (XALATAN ) 0.005 % ophthalmic solution Place 1 drop into the left eye at bedtime.   loperamide (IMODIUM A-D) 2 MG  tablet Take 2 mg by mouth 4 (four) times daily as needed for diarrhea or loose stools.   memantine  (NAMENDA ) 10 MG tablet Take 1 tablet (10 mg total) by mouth 2 (two) times daily.   metoprolol  tartrate (LOPRESSOR ) 50 MG tablet Take 50 mg by mouth 2 (two) times daily.   Multiple Vitamins-Minerals (CENTRUM SILVER ADULT 50+) TABS Take 1 tablet by mouth in the morning.   Multiple Vitamins-Minerals (PRESERVISION AREDS) TABS Take 1 capsule by mouth in the morning.   NOVOLIN 70/30 (70-30) 100 UNIT/ML injection Inject 5-12 Units into the skin 3 (three) times daily as needed (high blood sugar).   predniSONE  (DELTASONE ) 10 MG tablet Take by mouth.   tamsulosin  (FLOMAX ) 0.4 MG CAPS capsule Take 1 capsule (0.4 mg total) by mouth at bedtime.   Tiotropium Bromide-Olodaterol (STIOLTO RESPIMAT ) 2.5-2.5 MCG/ACT AERS Inhale 2 puffs into the lungs daily.   tiZANidine (ZANAFLEX) 2 MG tablet Take 2 mg by mouth at bedtime as needed.    Physical Exam:    VS:  BP 120/64   Pulse (!) 59   Ht 5' 8 (1.727 m)   Wt 186 lb 3.2 oz (84.5 kg)   SpO2 97%   BMI 28.31 kg/m    Wt Readings from Last 3 Encounters:  12/16/23 186 lb 3.2 oz (84.5 kg)  11/24/23 188 lb 8 oz (85.5 kg)  09/17/23 190 lb (86.2 kg)    GEN: Well nourished, well developed in no acute distress NECK: No JVD; No carotid bruits CARDIAC: RRR, no murmurs, rubs, gallops RESPIRATORY:  Clear to auscultation without rales, wheezing or rhonchi  ABDOMEN: Soft, non-tender, non-distended EXTREMITIES:  No edema; No acute deformity     Asessement and Plan:SABRA    Hypertension: Blood pressure today 120/64.  Patient denies any dizziness, lightheadedness, presyncope or syncope.  No ACE inhibitor as a result of prior angioedema.  Continue amlodipine  10 mg daily, metoprolol .  Bradycardia: Patient initial EKG indicated sinus bradycardia at 50 bpm, patient denies any fatigue, dizziness, lightheadedness, presyncope or syncope.  Patient is wife are unsure as to why this  medication was increased, they believe it was from neurology and possibly related to his Parkinson's.  As patient is asymptomatic and shows appropriate chronotropic competence we will continue, he will notify the office if he has any of the previously mentioned symptoms.  Reviewed ED precautions.  HLD: Last lipid profile on 30/20/25 indicated LDL 72.  Continue atorvastatin  20 mg daily.   Disposition: F/u with Dr. Jeffrie in one year, discussed as needed follow up, patient requested one year.   Signed, Josette Shimabukuro D Maeve Debord, NP

## 2023-12-16 ENCOUNTER — Telehealth: Payer: Self-pay | Admitting: Neurology

## 2023-12-16 ENCOUNTER — Encounter: Payer: Self-pay | Admitting: Cardiology

## 2023-12-16 ENCOUNTER — Ambulatory Visit: Attending: Cardiovascular Disease | Admitting: Cardiology

## 2023-12-16 VITALS — BP 120/64 | HR 59 | Ht 68.0 in | Wt 186.2 lb

## 2023-12-16 DIAGNOSIS — I10 Essential (primary) hypertension: Secondary | ICD-10-CM

## 2023-12-16 DIAGNOSIS — R001 Bradycardia, unspecified: Secondary | ICD-10-CM

## 2023-12-16 DIAGNOSIS — E782 Mixed hyperlipidemia: Secondary | ICD-10-CM | POA: Diagnosis not present

## 2023-12-16 NOTE — Patient Instructions (Signed)
 Medication Instructions:  No changes *If you need a refill on your cardiac medications before your next appointment, please call your pharmacy*  Lab Work: No labs  Testing/Procedures: No testing  Follow-Up: At [facility] you and your health needs are our priority.  As part of our continuing mission to provide you with exceptional heart care, our providers are all part of one team.  This team includes your primary Cardiologist (physician) and Advanced Practice Providers or APPs (Physician Assistants and Nurse Practitioners) who all work together to provide you with the care you need, when you need it.  Your next appointment:   1 year(s)  Provider:   Oneil Parchment, MD

## 2023-12-16 NOTE — Telephone Encounter (Signed)
 Called pts wife and relayed information. She was ok w/waiting 12 months and forgoing DAT scan. Pt stated that she can't make him do anything and that just let it be for now

## 2023-12-16 NOTE — Telephone Encounter (Signed)
 Please call patient's wife. Dr. Chalice reviewed chart mentioned felt he could see her in 12 months vs 6 months. Will you offer this patient and family is they feel he is stable. Her comments below:  If he is content on current regimen , we can forgo a DAT scan - that's only needed if the dx of vascular Parkinson's disease is questioned.   Can he follow up yearly as long as not getting worse ( His cognitive function decline is not abrupt but gradual) ? CD

## 2023-12-23 ENCOUNTER — Emergency Department (HOSPITAL_COMMUNITY)

## 2023-12-23 ENCOUNTER — Telehealth: Payer: Self-pay

## 2023-12-23 ENCOUNTER — Other Ambulatory Visit: Payer: Self-pay

## 2023-12-23 ENCOUNTER — Emergency Department (HOSPITAL_COMMUNITY)
Admission: EM | Admit: 2023-12-23 | Discharge: 2023-12-23 | Disposition: A | Discharge status: Home / Self Care | Attending: Emergency Medicine | Admitting: Emergency Medicine

## 2023-12-23 ENCOUNTER — Encounter (HOSPITAL_COMMUNITY): Payer: Self-pay

## 2023-12-23 DIAGNOSIS — R059 Cough, unspecified: Secondary | ICD-10-CM | POA: Diagnosis not present

## 2023-12-23 DIAGNOSIS — J449 Chronic obstructive pulmonary disease, unspecified: Secondary | ICD-10-CM | POA: Insufficient documentation

## 2023-12-23 DIAGNOSIS — Z7982 Long term (current) use of aspirin: Secondary | ICD-10-CM | POA: Diagnosis not present

## 2023-12-23 DIAGNOSIS — M19021 Primary osteoarthritis, right elbow: Secondary | ICD-10-CM | POA: Diagnosis not present

## 2023-12-23 DIAGNOSIS — R0902 Hypoxemia: Secondary | ICD-10-CM | POA: Diagnosis not present

## 2023-12-23 DIAGNOSIS — D869 Sarcoidosis, unspecified: Secondary | ICD-10-CM | POA: Diagnosis not present

## 2023-12-23 DIAGNOSIS — E119 Type 2 diabetes mellitus without complications: Secondary | ICD-10-CM | POA: Insufficient documentation

## 2023-12-23 DIAGNOSIS — R0989 Other specified symptoms and signs involving the circulatory and respiratory systems: Secondary | ICD-10-CM | POA: Diagnosis not present

## 2023-12-23 DIAGNOSIS — E1122 Type 2 diabetes mellitus with diabetic chronic kidney disease: Secondary | ICD-10-CM | POA: Diagnosis not present

## 2023-12-23 DIAGNOSIS — R911 Solitary pulmonary nodule: Secondary | ICD-10-CM | POA: Diagnosis not present

## 2023-12-23 DIAGNOSIS — Z79899 Other long term (current) drug therapy: Secondary | ICD-10-CM | POA: Diagnosis not present

## 2023-12-23 DIAGNOSIS — I509 Heart failure, unspecified: Secondary | ICD-10-CM | POA: Diagnosis not present

## 2023-12-23 DIAGNOSIS — M25521 Pain in right elbow: Secondary | ICD-10-CM | POA: Insufficient documentation

## 2023-12-23 DIAGNOSIS — I700 Atherosclerosis of aorta: Secondary | ICD-10-CM | POA: Diagnosis not present

## 2023-12-23 DIAGNOSIS — I130 Hypertensive heart and chronic kidney disease with heart failure and stage 1 through stage 4 chronic kidney disease, or unspecified chronic kidney disease: Secondary | ICD-10-CM | POA: Diagnosis not present

## 2023-12-23 DIAGNOSIS — Z794 Long term (current) use of insulin: Secondary | ICD-10-CM | POA: Insufficient documentation

## 2023-12-23 DIAGNOSIS — F0390 Unspecified dementia without behavioral disturbance: Secondary | ICD-10-CM | POA: Diagnosis not present

## 2023-12-23 DIAGNOSIS — Z7951 Long term (current) use of inhaled steroids: Secondary | ICD-10-CM | POA: Insufficient documentation

## 2023-12-23 DIAGNOSIS — N189 Chronic kidney disease, unspecified: Secondary | ICD-10-CM | POA: Diagnosis not present

## 2023-12-23 DIAGNOSIS — R509 Fever, unspecified: Secondary | ICD-10-CM | POA: Diagnosis not present

## 2023-12-23 DIAGNOSIS — R590 Localized enlarged lymph nodes: Secondary | ICD-10-CM | POA: Diagnosis not present

## 2023-12-23 DIAGNOSIS — M25421 Effusion, right elbow: Secondary | ICD-10-CM | POA: Diagnosis not present

## 2023-12-23 DIAGNOSIS — M79631 Pain in right forearm: Secondary | ICD-10-CM | POA: Diagnosis not present

## 2023-12-23 LAB — CBC WITH DIFFERENTIAL/PLATELET
Abs Immature Granulocytes: 0.03 K/uL (ref 0.00–0.07)
Basophils Absolute: 0 K/uL (ref 0.0–0.1)
Basophils Relative: 0 %
Eosinophils Absolute: 0 K/uL (ref 0.0–0.5)
Eosinophils Relative: 0 %
HCT: 37.7 % — ABNORMAL LOW (ref 39.0–52.0)
Hemoglobin: 11.6 g/dL — ABNORMAL LOW (ref 13.0–17.0)
Immature Granulocytes: 0 %
Lymphocytes Relative: 12 %
Lymphs Abs: 1.1 K/uL (ref 0.7–4.0)
MCH: 24 pg — ABNORMAL LOW (ref 26.0–34.0)
MCHC: 30.8 g/dL (ref 30.0–36.0)
MCV: 78.1 fL — ABNORMAL LOW (ref 80.0–100.0)
Monocytes Absolute: 1.2 K/uL — ABNORMAL HIGH (ref 0.1–1.0)
Monocytes Relative: 13 %
Neutro Abs: 6.7 K/uL (ref 1.7–7.7)
Neutrophils Relative %: 75 %
Platelets: 124 K/uL — ABNORMAL LOW (ref 150–400)
RBC: 4.83 MIL/uL (ref 4.22–5.81)
RDW: 17.2 % — ABNORMAL HIGH (ref 11.5–15.5)
WBC: 9 K/uL (ref 4.0–10.5)
nRBC: 0 % (ref 0.0–0.2)

## 2023-12-23 LAB — I-STAT CG4 LACTIC ACID, ED: Lactic Acid, Venous: 1.9 mmol/L (ref 0.5–1.9)

## 2023-12-23 LAB — BASIC METABOLIC PANEL WITH GFR
Anion gap: 12 (ref 5–15)
BUN: 23 mg/dL (ref 8–23)
CO2: 23 mmol/L (ref 22–32)
Calcium: 9.1 mg/dL (ref 8.9–10.3)
Chloride: 108 mmol/L (ref 98–111)
Creatinine, Ser: 1.35 mg/dL — ABNORMAL HIGH (ref 0.61–1.24)
GFR, Estimated: 53 mL/min — ABNORMAL LOW (ref >60)
Glucose, Bld: 251 mg/dL — ABNORMAL HIGH (ref 70–99)
Potassium: 3.8 mmol/L (ref 3.5–5.1)
Sodium: 143 mmol/L (ref 135–145)

## 2023-12-23 LAB — RESP PANEL BY RT-PCR (RSV, FLU A&B, COVID)  RVPGX2
Influenza A by PCR: NEGATIVE
Influenza B by PCR: NEGATIVE
Resp Syncytial Virus by PCR: NEGATIVE
SARS Coronavirus 2 by RT PCR: NEGATIVE

## 2023-12-23 MED ORDER — LIDOCAINE-EPINEPHRINE (PF) 2 %-1:200000 IJ SOLN
20.0000 mL | Freq: Once | INTRAMUSCULAR | Status: AC
  Administered 2023-12-23: 20 mL
  Filled 2023-12-23: qty 20

## 2023-12-23 MED ORDER — METHYLPREDNISOLONE SODIUM SUCC 125 MG IJ SOLR
125.0000 mg | Freq: Once | INTRAMUSCULAR | Status: AC
  Administered 2023-12-23: 125 mg via INTRAVENOUS
  Filled 2023-12-23: qty 2

## 2023-12-23 MED ORDER — HYDROCODONE-ACETAMINOPHEN 5-325 MG PO TABS
1.0000 | ORAL_TABLET | Freq: Once | ORAL | Status: AC
  Administered 2023-12-23: 1 via ORAL
  Filled 2023-12-23: qty 1

## 2023-12-23 MED ORDER — METHYLPREDNISOLONE 4 MG PO TBPK: ORAL_TABLET | ORAL | 0 refills | Status: AC

## 2023-12-23 MED ORDER — ACETAMINOPHEN 325 MG PO TABS
650.0000 mg | ORAL_TABLET | Freq: Once | ORAL | Status: AC
  Administered 2023-12-23: 650 mg via ORAL
  Filled 2023-12-23: qty 2

## 2023-12-23 NOTE — ED Notes (Signed)
 Pt ambulated with pulse oxymetry 6ft distance. O2 dropped to 87% Room air

## 2023-12-23 NOTE — Discharge Instructions (Addendum)
 Follow up with Dr. Jerri (orthopedics) as instructed - call tomorrow for an appointment.   Return for any problem.

## 2023-12-23 NOTE — ED Provider Notes (Signed)
 [name] EMERGENCY DEPARTMENT AT [facility] Provider Note   CSN: [id number] Arrival date & time: 12/23/23  1203     Patient presents with: Arm Pain   [name] is a 83 y.o. male.   HPI 83 year old male presents with atraumatic right arm pain. Hurts from his right elbow down into his forearm. He doesn't know if it's swollen.  Wife states the pain started yesterday and he has not had any falls or fevers.  Has a history of gout but has never had gout in the elbow. No numbness.  Prior to Admission medications   Medication Sig Start Date End Date Taking? Authorizing Provider  ACCU-CHEK AVIVA PLUS test strip USE AS INSTRUCTED TO CHECK BLOOD SUGAR THREE TIMES DAILY 11/11/16   Von Pacific, MD  acetaminophen  (TYLENOL ) 325 MG tablet Take 650 mg by mouth every 6 (six) hours as needed for moderate pain. Patient not taking: Reported on 12/16/2023    [provider]  albuterol  (VENTOLIN  HFA) 108 (90 Base) MCG/ACT inhaler Inhale 2 puffs into the lungs every 6 (six) hours as needed for wheezing or shortness of breath. 04/19/23   Regalado, Belkys A, MD  amLODipine  (NORVASC ) 10 MG tablet Take 1 tablet (10 mg total) by mouth daily. 10/30/17   Pearlean Manus, MD  aspirin  81 MG tablet Take 81 mg by mouth at bedtime.     [provider]  atorvastatin  (LIPITOR) 20 MG tablet Take 20 mg by mouth every evening. 12/16/18   [provider]  Blood Glucose Monitoring Suppl (ACCU-CHEK AVIVA PLUS) w/Device KIT Use to check blood sugar 3 times per day dx code E11.65 03/12/16   Von Pacific, MD  carbidopa -levodopa  (SINEMET  IR) 25-100 MG tablet TAKE 1 TABLET IN THE MORNING, 1 TABLET IN THE AFTERNOON, AND 2 TABLETS AT NIGHT 11/24/23   Gayland Lauraine PARAS, NP  donepezil  (ARICEPT ) 10 MG tablet Take 1 tablet (10 mg total) by mouth at bedtime. 11/24/23   Gayland Lauraine PARAS, NP  dorzolamide -timolol  (COSOPT ) 22.3-6.8 MG/ML ophthalmic solution Place 1 drop into the right eye 2 (two) times daily.     [provider]  escitalopram  (LEXAPRO ) 10 MG tablet Take 1 tablet (10 mg total) by mouth daily. 11/24/23   Gayland Lauraine PARAS, NP  ferrous sulfate  325 (65 FE) MG tablet Take 1 tablet (325 mg total) by mouth 2 (two) times daily. 10/30/17   Pearlean Manus, MD  fish oil-omega-3 fatty acids  1000 MG capsule Take 1 g by mouth in the morning.    [provider]  guaifenesin (ROBITUSSIN) 100 MG/5ML syrup Take 200 mg by mouth 3 (three) times daily as needed for cough.    [provider]  Insulin  Syringe-Needle U-100 (INSULIN  SYRINGE .5CC/31GX5/16) 31G X 5/16 0.5 ML MISC Use one to inject insulin  daily 05/02/15   Von Pacific, MD  latanoprost  (XALATAN ) 0.005 % ophthalmic solution Place 1 drop into the left eye at bedtime. 02/17/14   [provider]  loperamide (IMODIUM A-D) 2 MG tablet Take 2 mg by mouth 4 (four) times daily as needed for diarrhea or loose stools.    [provider]  meclizine  (ANTIVERT ) 25 MG tablet Take 1 tablet (25 mg total) by mouth 3 (three) times daily as needed for dizziness. Patient not taking: Reported on 12/16/2023 07/05/16   Raford Lenis, MD  memantine  (NAMENDA ) 10 MG tablet Take 1 tablet (10 mg total) by mouth 2 (two) times daily. 11/24/23   Gayland Lauraine PARAS, NP  metoprolol   tartrate (LOPRESSOR ) 50 MG tablet Take 50 mg by mouth 2 (two) times daily.    [provider]  Multiple Vitamins-Minerals (CENTRUM SILVER ADULT 50+) TABS Take 1 tablet by mouth in the morning.    [provider]  Multiple Vitamins-Minerals (PRESERVISION AREDS) TABS Take 1 capsule by mouth in the morning.    [provider]  NOVOLIN 70/30 (70-30) 100 UNIT/ML injection Inject 5-12 Units into the skin 3 (three) times daily as needed (high blood sugar). 05/16/19   [provider]  ondansetron  (ZOFRAN -ODT) 4 MG disintegrating tablet Take 1 tablet (4 mg total) by mouth every 8 (eight) hours as needed for nausea or vomiting. Patient not taking:  Reported on 12/16/2023 06/17/23   Ula Prentice SAUNDERS, MD  ondansetron  (ZOFRAN -ODT) 4 MG disintegrating tablet Take 1 tablet (4 mg total) by mouth every 8 (eight) hours as needed for nausea or vomiting. Patient not taking: Reported on 12/16/2023 06/17/23   Ula Prentice SAUNDERS, MD  predniSONE  (DELTASONE ) 10 MG tablet Take by mouth. 04/29/23   [provider]  tamsulosin  (FLOMAX ) 0.4 MG CAPS capsule Take 1 capsule (0.4 mg total) by mouth at bedtime. 10/30/17   Pearlean Manus, MD  Tiotropium Bromide-Olodaterol (STIOLTO RESPIMAT ) 2.5-2.5 MCG/ACT AERS Inhale 2 puffs into the lungs daily. 05/20/23   Cobb, Comer GAILS, NP  tiZANidine (ZANAFLEX) 2 MG tablet Take 2 mg by mouth at bedtime as needed. 04/29/23   [provider]    Allergies: Ace inhibitors and Allopurinol    Review of Systems  Unable to perform ROS: Dementia    Updated Vital Signs BP (!) 163/66   Pulse 75   Temp 99.5 F (37.5 C) (Oral)   Resp 16   SpO2 92%   Physical Exam Vitals and nursing note reviewed.  Constitutional:      Appearance: He is well-developed.  HENT:     Head: Normocephalic and atraumatic.  Cardiovascular:     Rate and Rhythm: Normal rate and regular rhythm.     Pulses:          Radial pulses are 2+ on the right side.  Pulmonary:     Effort: Pulmonary effort is normal.  Musculoskeletal:     Right elbow: Decreased range of motion. Tenderness present.     Comments: There is warmth and tenderness to the right elbow. No significant upper arm or forearm tenderness/swelling. Grossly normal sensation and strength in right hand.  Skin:    General: Skin is warm and dry.  Neurological:     Mental Status: He is alert.     (all labs ordered are listed, but only abnormal results are displayed) Labs Reviewed  CBC WITH DIFFERENTIAL/PLATELET  BASIC METABOLIC PANEL WITH GFR    EKG: None  Radiology: No results found.   .Joint Aspiration/Arthrocentesis  Date/Time: 12/23/2023 4:25 PM  Performed by:  Freddi Hamilton, MD Authorized by: Freddi Hamilton, MD   Consent:    Consent obtained:  Verbal and written   Consent given by:  Patient   Risks, benefits, and alternatives were discussed: yes     Risks discussed:  Bleeding, incomplete drainage, nerve damage, infection and pain   Alternatives discussed:  No treatment Universal protocol:    Patient identity confirmed:  Verbally with patient Location:    Location:  Elbow   Elbow:  R elbow Anesthesia:    Anesthesia method:  Local infiltration   Local anesthetic:  Lidocaine  2% WITH epi Procedure details:    Preparation: Patient was prepped and  draped in usual sterile fashion     Approach:  Lateral   Aspirate amount:  0 Post-procedure details:    Procedure completion:  Tolerated well, no immediate complications Comments:     unsuccessful    Medications Ordered in the ED  HYDROcodone -acetaminophen  (NORCO/VICODIN) 5-325 MG per tablet 1 tablet (has no administration in time range)                                    Medical Decision Making Amount and/or Complexity of Data Reviewed Independent Historian: spouse Labs: ordered.    Details: Normal WBC Radiology: ordered and independent interpretation performed.    Details: Elbow joint effusion  Risk Prescription drug management.   Patient presents with right elbow pain.  He does have a history of gout which is the most likely cause of his symptoms but he does so has a low-grade temperature here.  Unfortunately his arthrocentesis by myself was unsuccessful.  I discussed with Dr. Jerri of orthopedics.  He feels like this is still most likely gout and a reasonable course of action would be to put him on steroids and have him follow-up in the office on 9/5 in the morning and he can reevaluate him.  Unfortunately, when I went to enact this plan, it was found out that he is actually been having a cough for a week and has had some mild hypoxia (though it seems like this is mostly when he is  asleep).  He does wear CPAP at night but no oxygen.  At first when I was talking the patient it seems like the cough was more of a chronic problem but the wife now tells me it has been about a week.  Will send a respiratory panel given his low-grade temperature as well as a chest x-ray.  Care transferred to Dr. Laurice.     Final diagnoses:  None    ED Discharge Orders     None          Freddi Hamilton, MD 12/23/23 1627

## 2023-12-23 NOTE — ED Triage Notes (Signed)
 Pt BIB EMS from Home due to Right Forearm Pain since yesterday. Right arm is warm and pain with touch. No other symptoms strong radial and ulnar pulses. Hx of Gout, DM2, CHF, CKD, COPD, Dementia. Pt AAOx4.  BP 162/80 HR 80 RR 16 SpO2 97 CBG 217

## 2023-12-23 NOTE — ED Provider Notes (Signed)
 Patient seen at her prior EDP.  Patient without clear evidence of pulmonary infection.  Patient with known history of COPD.  Patient with mild observed oxygen desaturations with ambulation.  He is asymptomatic.  Patient offered admission.  He declines.  He wants to go home.  He reports that he is hungry.  Patient and patient's family understand need for close outpatient follow-up with orthopedics for further evaluation of his right elbow pain.  Patient given Medrol  Dosepak as per recommendations of orthopedics.   Laurice Maude BROCKS, MD 12/23/23 2113

## 2023-12-23 NOTE — ED Notes (Signed)
Orthopedic tech at bedside. °

## 2023-12-23 NOTE — Telephone Encounter (Signed)
 Patient is in the ED at [facility]. Dr.Goldston is requesting a call. [phone]

## 2023-12-23 NOTE — Telephone Encounter (Signed)
 Spoke to EDP.  They will treat as gout and we can work him in Friday morning for me to recheck.  Thanks.

## 2023-12-23 NOTE — Progress Notes (Signed)
 Orthopedic Tech Progress Note Patient Details:  [name] 01-06-1941 [id number]  Ortho Devices Type of Ortho Device: Shoulder immobilizer Ortho Device/Splint Location: RUE Ortho Device/Splint Interventions: Ordered, Application, Adjustment, Removal   Post Interventions Patient Tolerated: Well Instructions Provided: Adjustment of device  Britainy Kozub OTR/L 12/23/2023, 9:54 PM

## 2023-12-24 DIAGNOSIS — M25521 Pain in right elbow: Secondary | ICD-10-CM | POA: Diagnosis not present

## 2023-12-24 NOTE — Telephone Encounter (Signed)
 Tried to call patient to schedule for work in this Friday morning. No answer. LMOM for patient to call me back for scheduling.

## 2023-12-28 LAB — CULTURE, BLOOD (ROUTINE X 2)
Culture: NO GROWTH
Special Requests: ADEQUATE

## 2024-01-04 DIAGNOSIS — I129 Hypertensive chronic kidney disease with stage 1 through stage 4 chronic kidney disease, or unspecified chronic kidney disease: Secondary | ICD-10-CM | POA: Diagnosis not present

## 2024-01-04 DIAGNOSIS — F330 Major depressive disorder, recurrent, mild: Secondary | ICD-10-CM | POA: Diagnosis not present

## 2024-01-04 DIAGNOSIS — N1831 Chronic kidney disease, stage 3a: Secondary | ICD-10-CM | POA: Diagnosis not present

## 2024-01-04 DIAGNOSIS — F01A0 Vascular dementia, mild, without behavioral disturbance, psychotic disturbance, mood disturbance, and anxiety: Secondary | ICD-10-CM | POA: Diagnosis not present

## 2024-01-04 DIAGNOSIS — G214 Vascular parkinsonism: Secondary | ICD-10-CM | POA: Diagnosis not present

## 2024-01-04 DIAGNOSIS — E1169 Type 2 diabetes mellitus with other specified complication: Secondary | ICD-10-CM | POA: Diagnosis not present

## 2024-01-04 DIAGNOSIS — E785 Hyperlipidemia, unspecified: Secondary | ICD-10-CM | POA: Diagnosis not present

## 2024-01-04 DIAGNOSIS — Z23 Encounter for immunization: Secondary | ICD-10-CM | POA: Diagnosis not present

## 2024-01-04 DIAGNOSIS — I700 Atherosclerosis of aorta: Secondary | ICD-10-CM | POA: Diagnosis not present

## 2024-02-03 DIAGNOSIS — E1165 Type 2 diabetes mellitus with hyperglycemia: Secondary | ICD-10-CM | POA: Diagnosis not present

## 2024-02-15 DIAGNOSIS — J40 Bronchitis, not specified as acute or chronic: Secondary | ICD-10-CM | POA: Diagnosis not present

## 2024-02-15 DIAGNOSIS — Z03818 Encounter for observation for suspected exposure to other biological agents ruled out: Secondary | ICD-10-CM | POA: Diagnosis not present

## 2024-02-15 DIAGNOSIS — E1169 Type 2 diabetes mellitus with other specified complication: Secondary | ICD-10-CM | POA: Diagnosis not present

## 2024-02-15 DIAGNOSIS — I129 Hypertensive chronic kidney disease with stage 1 through stage 4 chronic kidney disease, or unspecified chronic kidney disease: Secondary | ICD-10-CM | POA: Diagnosis not present

## 2024-02-15 DIAGNOSIS — J029 Acute pharyngitis, unspecified: Secondary | ICD-10-CM | POA: Diagnosis not present

## 2024-02-15 DIAGNOSIS — N1831 Chronic kidney disease, stage 3a: Secondary | ICD-10-CM | POA: Diagnosis not present

## 2024-02-15 DIAGNOSIS — R051 Acute cough: Secondary | ICD-10-CM | POA: Diagnosis not present

## 2024-03-03 DIAGNOSIS — R3915 Urgency of urination: Secondary | ICD-10-CM | POA: Diagnosis not present

## 2024-03-03 DIAGNOSIS — Z8546 Personal history of malignant neoplasm of prostate: Secondary | ICD-10-CM | POA: Diagnosis not present

## 2024-03-03 DIAGNOSIS — R399 Unspecified symptoms and signs involving the genitourinary system: Secondary | ICD-10-CM | POA: Diagnosis not present

## 2024-04-20 ENCOUNTER — Ambulatory Visit: Admitting: Podiatry

## 2024-04-20 ENCOUNTER — Encounter: Payer: Self-pay | Admitting: Podiatry

## 2024-04-20 VITALS — Ht 68.0 in | Wt 186.2 lb

## 2024-04-20 DIAGNOSIS — Z794 Long term (current) use of insulin: Secondary | ICD-10-CM

## 2024-04-20 DIAGNOSIS — E1121 Type 2 diabetes mellitus with diabetic nephropathy: Secondary | ICD-10-CM

## 2024-04-20 DIAGNOSIS — B351 Tinea unguium: Secondary | ICD-10-CM | POA: Diagnosis not present

## 2024-04-20 DIAGNOSIS — M79675 Pain in left toe(s): Secondary | ICD-10-CM

## 2024-04-20 DIAGNOSIS — M79674 Pain in right toe(s): Secondary | ICD-10-CM | POA: Diagnosis not present

## 2024-04-20 NOTE — Progress Notes (Signed)
 This patient returns to my office for at risk foot care.  This patient requires this care by a professional since this patient will be at risk due to having type 2 diabetes.  This patient is unable to cut nails himself since the patient cannot reach his nails.These nails are painful walking and wearing shoes.  This patient presents for at risk foot care today.  General Appearance  Alert, conversant and in no acute stress.  Vascular  Dorsalis pedis and posterior tibial  pulses are palpable  bilaterally.  Capillary return is within normal limits  bilaterally. Temperature is within normal limits  bilaterally.  Neurologic  Senn-Weinstein monofilament wire test within normal limits  bilaterally. Muscle power within normal limits bilaterally.  Nails Thick disfigured discolored nails with subungual debris  from hallux to fifth toes bilaterally. No evidence of bacterial infection or drainage bilaterally.  Orthopedic  No limitations of motion  feet .  No crepitus or effusions noted.  No bony pathology or digital deformities noted.  Skin  normotropic skin with no porokeratosis noted bilaterally.  No signs of infections or ulcers noted.     Onychomycosis  Pain in right toes  Pain in left toes  Consent was obtained for treatment procedures.   Mechanical debridement of nails 1-5  bilaterally performed with a nail nipper.  Filed with dremel without incident.    Return office visit    3 months                 Told patient to return for periodic foot care and evaluation due to potential at risk complications.   Helane Gunther DPM

## 2024-05-30 ENCOUNTER — Ambulatory Visit: Admitting: Neurology

## 2024-07-20 ENCOUNTER — Ambulatory Visit: Admitting: Podiatry
